# Patient Record
Sex: Female | Born: 1985
Health system: Southern US, Community
[De-identification: ages and names within clinical notes are randomized; demographics above are authoritative.]

## PROBLEM LIST (undated history)

## (undated) ENCOUNTER — Inpatient Hospital Stay (HOSPITAL_COMMUNITY): Payer: Self-pay

## (undated) ENCOUNTER — Inpatient Hospital Stay (HOSPITAL_COMMUNITY): Payer: Medicare Other

## (undated) DIAGNOSIS — Z3490 Encounter for supervision of normal pregnancy, unspecified, unspecified trimester: Secondary | ICD-10-CM

## (undated) DIAGNOSIS — T07XXXA Unspecified multiple injuries, initial encounter: Secondary | ICD-10-CM

## (undated) DIAGNOSIS — F4310 Post-traumatic stress disorder, unspecified: Secondary | ICD-10-CM

## (undated) DIAGNOSIS — G935 Compression of brain: Secondary | ICD-10-CM

## (undated) DIAGNOSIS — J189 Pneumonia, unspecified organism: Secondary | ICD-10-CM

## (undated) DIAGNOSIS — F99 Mental disorder, not otherwise specified: Secondary | ICD-10-CM

## (undated) DIAGNOSIS — K219 Gastro-esophageal reflux disease without esophagitis: Secondary | ICD-10-CM

## (undated) DIAGNOSIS — J329 Chronic sinusitis, unspecified: Secondary | ICD-10-CM

## (undated) DIAGNOSIS — R51 Headache: Secondary | ICD-10-CM

## (undated) DIAGNOSIS — Z87898 Personal history of other specified conditions: Secondary | ICD-10-CM

## (undated) DIAGNOSIS — F329 Major depressive disorder, single episode, unspecified: Secondary | ICD-10-CM

## (undated) DIAGNOSIS — Z9889 Other specified postprocedural states: Secondary | ICD-10-CM

## (undated) DIAGNOSIS — Z87820 Personal history of traumatic brain injury: Secondary | ICD-10-CM

## (undated) DIAGNOSIS — J45909 Unspecified asthma, uncomplicated: Secondary | ICD-10-CM

## (undated) DIAGNOSIS — F32A Depression, unspecified: Secondary | ICD-10-CM

## (undated) DIAGNOSIS — R112 Nausea with vomiting, unspecified: Secondary | ICD-10-CM

## (undated) DIAGNOSIS — Z8781 Personal history of (healed) traumatic fracture: Secondary | ICD-10-CM

## (undated) DIAGNOSIS — J40 Bronchitis, not specified as acute or chronic: Secondary | ICD-10-CM

## (undated) HISTORY — DX: Mental disorder, not otherwise specified: F99

## (undated) HISTORY — DX: Chronic sinusitis, unspecified: J32.9

## (undated) HISTORY — DX: Encounter for supervision of normal pregnancy, unspecified, unspecified trimester: Z34.90

## (undated) HISTORY — DX: Headache: R51

## (undated) HISTORY — DX: Major depressive disorder, single episode, unspecified: F32.9

## (undated) HISTORY — PX: WISDOM TOOTH EXTRACTION: SHX21

## (undated) HISTORY — DX: Depression, unspecified: F32.A

## (undated) HISTORY — PX: DILATION AND CURETTAGE OF UTERUS: SHX78

## (undated) HISTORY — PX: OTHER SURGICAL HISTORY: SHX169

## (undated) HISTORY — DX: Unspecified asthma, uncomplicated: J45.909

## (undated) HISTORY — DX: Bronchitis, not specified as acute or chronic: J40

## (undated) HISTORY — DX: Post-traumatic stress disorder, unspecified: F43.10

---
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
## 2005-12-20 ENCOUNTER — Emergency Department (HOSPITAL_COMMUNITY): Admission: EM | Admit: 2005-12-20 | Discharge: 2005-12-20 | Payer: Self-pay | Admitting: Emergency Medicine

## 2005-12-27 ENCOUNTER — Ambulatory Visit (HOSPITAL_COMMUNITY): Admission: RE | Admit: 2005-12-27 | Discharge: 2005-12-27 | Payer: Self-pay | Admitting: Emergency Medicine

## 2006-09-24 ENCOUNTER — Ambulatory Visit (HOSPITAL_COMMUNITY): Admission: AD | Admit: 2006-09-24 | Discharge: 2006-09-24 | Payer: Self-pay | Admitting: Obstetrics and Gynecology

## 2006-09-25 ENCOUNTER — Inpatient Hospital Stay (HOSPITAL_COMMUNITY): Admission: AD | Admit: 2006-09-25 | Discharge: 2006-09-26 | Payer: Self-pay | Admitting: Obstetrics and Gynecology

## 2007-06-10 ENCOUNTER — Ambulatory Visit: Payer: Self-pay | Admitting: Psychiatry

## 2008-10-27 ENCOUNTER — Other Ambulatory Visit: Admission: RE | Admit: 2008-10-27 | Discharge: 2008-10-27 | Payer: Self-pay | Admitting: Obstetrics and Gynecology

## 2008-11-27 ENCOUNTER — Ambulatory Visit (HOSPITAL_COMMUNITY): Admission: RE | Admit: 2008-11-27 | Discharge: 2008-11-27 | Payer: Self-pay | Admitting: Obstetrics and Gynecology

## 2009-01-04 ENCOUNTER — Ambulatory Visit: Payer: Self-pay | Admitting: Obstetrics and Gynecology

## 2009-01-04 ENCOUNTER — Inpatient Hospital Stay (HOSPITAL_COMMUNITY): Admission: AD | Admit: 2009-01-04 | Discharge: 2009-01-06 | Payer: Self-pay | Admitting: Family Medicine

## 2009-04-05 ENCOUNTER — Other Ambulatory Visit: Admission: RE | Admit: 2009-04-05 | Discharge: 2009-04-05 | Payer: Self-pay | Admitting: Obstetrics and Gynecology

## 2010-04-06 ENCOUNTER — Other Ambulatory Visit: Admission: RE | Admit: 2010-04-06 | Discharge: 2010-04-06 | Payer: Self-pay | Admitting: Obstetrics and Gynecology

## 2010-04-13 ENCOUNTER — Emergency Department (HOSPITAL_COMMUNITY): Admission: EM | Admit: 2010-04-13 | Discharge: 2010-04-13 | Payer: Self-pay | Admitting: Emergency Medicine

## 2011-01-01 ENCOUNTER — Encounter: Payer: Self-pay | Admitting: Obstetrics and Gynecology

## 2011-03-27 LAB — INFLUENZA A+B VIRUS AG-DIRECT(RAPID): Influenza B Ag: NEGATIVE

## 2011-03-27 LAB — URINALYSIS, ROUTINE W REFLEX MICROSCOPIC
Ketones, ur: NEGATIVE mg/dL
Nitrite: NEGATIVE
Urobilinogen, UA: 0.2 mg/dL (ref 0.0–1.0)
pH: 7.5 (ref 5.0–8.0)

## 2011-03-27 LAB — RPR: RPR Ser Ql: NONREACTIVE

## 2011-03-27 LAB — URINE MICROSCOPIC-ADD ON

## 2011-03-27 LAB — CBC
MCHC: 33.5 g/dL (ref 30.0–36.0)
RBC: 3.43 MIL/uL — ABNORMAL LOW (ref 3.87–5.11)
WBC: 11.6 10*3/uL — ABNORMAL HIGH (ref 4.0–10.5)

## 2011-04-06 ENCOUNTER — Emergency Department (HOSPITAL_COMMUNITY)
Admission: EM | Admit: 2011-04-06 | Discharge: 2011-04-07 | Disposition: A | Discharge status: Home / Self Care | Payer: Self-pay | Attending: Emergency Medicine | Admitting: Emergency Medicine

## 2011-04-06 DIAGNOSIS — F319 Bipolar disorder, unspecified: Secondary | ICD-10-CM | POA: Insufficient documentation

## 2011-04-06 DIAGNOSIS — L259 Unspecified contact dermatitis, unspecified cause: Secondary | ICD-10-CM | POA: Insufficient documentation

## 2011-04-06 DIAGNOSIS — J45909 Unspecified asthma, uncomplicated: Secondary | ICD-10-CM | POA: Insufficient documentation

## 2011-04-25 NOTE — Discharge Summary (Signed)
NAMEMelene [name]               ACCOUNT NO.:  [id number]   MEDICAL RECORD NO.:  [id number]          PATIENT TYPE:  INP   LOCATION:  9121                          FACILITY:  [facility]   PHYSICIAN:  Tanya S. Shawnie Pons, M.D.   DATE OF BIRTH:  [birth year]   DATE OF ADMISSION:  01/04/2009  DATE OF DISCHARGE:  01/06/2009                               DISCHARGE SUMMARY   DISCHARGE DIAGNOSES:  1. Spontaneous vaginal delivery at term.  2. Left lower lobe community-acquired pneumonia.  3. Rubella nonimmune.  4. Asthma.   DISCHARGE MEDICATIONS:  1. Prenatal vitamins 1 tablet p.o. daily.  2. Symbicort 80/4.5 two puffs twice a day.  3. Albuterol HFA 1-2 puffs every 4-6 hours as needed for wheezing or      shortness of breath.  4. Ibuprofen 600 mg every 6 hours as needed for pain.  5. Colace 100 mg b.i.d. as needed for constipation.  6. Percocet 5/325 one tablet every 4-6 hours as needed for pain #12      dispensed.  7. Azithromycin 500 mg 1 tablet daily x2 days for a total of 5-day      course.   IMAGING:  Chest x-ray on admission showed streaky left basilar opacity,  could be atelectasis or pneumonia.  No effusions seen.   LABORATORY DATA:  1. CBC on admission showed white blood count 11.6, hemoglobin 10.8,      hematocrit 32.1, platelets 207.  2. RPR nonreactive.  3. Influenza A and influenza B swab negative.   BRIEF HOSPITAL COURSE:  This is a 25 year old, G4, P 2-0-1-2, at 63  weeks' gestation, who presented to the MAU with contractions over the  past several hours.  The patient also was noted to have a productive  cough x1 day and having to use her albuterol at an increased dose of  daily.  Otherwise, she was also noted to have a fever in the MA, but  otherwise had no myalgias, headache, visual changes, nausea, vomiting,  constipation, diarrhea.  1. Spontaneous onset of labor.  The patient was found to be 4 cm and      was contracting every 4-6 minutes with membranes intact, vertex  position.  She was admitted to Labor and Delivery and delivered      several hours later by spontaneous vaginal delivery under epidural      anesthesia.  She had a viable female with Apgars of 8 and 9.  No      lacerations.  The patient continued to have a routine postpartum      course with the exception of increased low back pain and abdominal      cramping.  Her lower back pain had been present prior to pregnancy      and the patient is pursuing physical therapy with her primary care      physician.  2. Left lower lobe pneumonia.  The patient was noted to have shortness      of breath, but maintained good O2 saturations at all times.  She      has been afebrile after one fever  noted on admission with      resolution of shortness of breath.  By day of discharge, she is      doing well and will be discharged home on her home medications of      Symbicort and albuterol with the remainder of the 5-day course of      azithromycin.  3. Asthma.  Please see problem #2.  4. Rubella nonimmune.  The patient will need rubella vaccination      postpartum.  5. Contraception.  The patient is bottle-feeding and desires Depo      prior to discharge.   FOLLOWUP APPOINTMENTS:  The patient is instructed to follow up with her  primary care physician in 2 weeks to further titrate asthma medicines  and follow up resolution of pneumonia.  She is instructed to follow up  at [facility] in 6 weeks for postpartum appointment.   The patient discharged to home in stable condition.      Delbert Harness, MD      Shelbie Proctor. Shawnie Pons, M.D.  Electronically Signed    KB/MEDQ  D:  01/06/2009  T:  01/07/2009  Job:  [id number]   cc:   [facility]

## 2011-04-28 NOTE — Group Therapy Note (Signed)
NAMEMelene [name]               ACCOUNT NO.:  [id number]   MEDICAL RECORD NO.:  [id number]          PATIENT TYPE:  INP   LOCATION:  A411                          FACILITY:  [facility]   PHYSICIAN:  Tilda Burrow, M.D. DATE OF BIRTH:  [birth year]   DATE OF PROCEDURE:  09/25/2006  DATE OF DISCHARGE:                                   PROGRESS NOTE   DELIVERY NOTE   Mary [name] progressed rapidly through labor, especially after she got her  epidural, and was noted to be 10 cm at about 10:00. However, she was very  comfortable with no urge to push, so we let her labor down for while.  At  about 11, she decided to push, and she pushed twice and had a spontaneous  vaginal delivery of a viable female infant at 11:09.  The mouth and nose were  suctioned on the perineum with a bulb syringe.  The shoulders delivered  without any difficulty.  As always interesting to me, the baby had a  spontaneous and lusty cry before delivery of the whole body.  The mom  reached down between her legs after the torso was delivered and pulled the  baby the rest of the way out.  The baby was placed on the maternal abdomen.  Apgars were 9 and 9, weight 7 pounds 7 ounces.  The cord was doubly clamped  and cut by the father of the baby, and the infant transferred to the radiant  warmer.  The placenta separated spontaneously and delivered via controlled  cord traction at 11:12.  It was inspected and appears to be intact with a  three-vessel cord.  At that time, 20 units of Pitocin diluted in 1000 mL of  lactated Ringer's was infused rapidly IV.  Minimal blood loss was noted, and  the fundus was firm.  The vagina was inspected, and no lacerations were  found.  Estimated blood loss 200 mL.  The epidural catheter was then removed  with the blue tip visualized as being intact.      Jacklyn Shell, C.N.M.      Tilda Burrow, M.D.  Electronically Signed    FC/MEDQ  D:  09/25/2006  T:  09/26/2006  Job:   [id number]   cc:   Francoise Schaumann. Raynelle Highland  Fax: [phone]   [facility]

## 2011-04-28 NOTE — H&P (Signed)
NAMEMelene [name]               ACCOUNT NO.:  [id number]   MEDICAL RECORD NO.:  [id number]          PATIENT TYPE:  INP   LOCATION:  LDR2                          FACILITY:  [facility]   PHYSICIAN:  Tilda Burrow, M.D. DATE OF BIRTH:  03/02/1986   DATE OF ADMISSION:  09/25/2006  DATE OF DISCHARGE:  LH                                HISTORY & PHYSICAL   ADMITTING DIAGNOSIS:  Pregnancy at 40 weeks and 2 days.  Prodromal  __labor___ symptoms.  Admitted for amniotomy and induction of labor.   HISTORY OF PRESENT ILLNESS:  This 25 year old gravida 3, para 1, AB1, LMP  December 17, 2005, placing [facility] September 23, 2006, with corresponding second  trimester ultrasound, is admitted after pregnancy course notable for late  initial presentation, uncomplicated prenatal course subsequently, with blood  type A positive, rubella immunity absent, hepatitis, HIV, RPR, GC and  Chlamydia all negative, group B strep negative, glucose tolerance __test wnl   PAST MEDICAL HISTORY:  Positive for asthma, not requiring medication at this  time.   SURGICAL HISTORY:  Negative.   ALLERGIES:  SULFA.   PHYSICAL EXAMINATION:  GENERAL:  A healthy, slim, cheerful, pleasant  Caucasian female who is tolerating labor well, supported by her partner,  Mary Needle.  External monitor shows excellent fetal heart rate reactivity, term  size fetus, vertex presentation.  The cervix is 3 cm long, -2, posterior.  Membranes intact.   PLAN:  Admit at 6 a.m. on September 25, 2006 for amniotomy, induction of labor  with probable epidural for labor management.      Tilda Burrow, M.D.  Electronically Signed     JVF/MEDQ  D:  09/24/2006  T:  09/25/2006  Job:  [id number]

## 2011-04-28 NOTE — Consult Note (Signed)
NAMEMelene [name]               ACCOUNT NO.:  [id number]   MEDICAL RECORD NO.:  [id number]          PATIENT TYPE:  OIB   LOCATION:  LDR5                          FACILITY:  [facility]   PHYSICIAN:  Tilda Burrow, M.D. DATE OF BIRTH:  August 14, 1986   DATE OF CONSULTATION:  09/24/2006  DATE OF DISCHARGE:  09/24/2006                                   CONSULTATION   Observation x2 hour to rule out labor.   HISTORY OF PRESENT ILLNESS:  This 25 year old gravida 3, para 1, AB 1 with  one 54-year-old child at home who after a very easy delivery of an 8 pound 7  ounces under epidural analgesia is seen in labor and delivery at 40 weeks 1  day with prodromal labor symptoms.  Cervix was 2 cm, 70%, -1 on the 9th and  exam today shows the cervix to 2 to 3 cm, rather long in my opinion  uneffaced at -2 and quite posterior.  Vertex presentation is confirmable by  palpation and digital exam.  Membranes are intact.  She shows excellent  reactive fetal heart rate.  Contractions of a very mild irregular nature  every seven minutes or more.  After an hour of observation, there is no  change in cervix, no bloody show, no membrane rupture.   Discussion of options ensued and patient is comfortable with the plan  including her go home, walk.  Being given a prescription for Ambien 10 mg to  take when she goes home with plans to have her come back at 6 a.m. in the  morning for Pitocin induction of labor and probable vaginal delivery  tomorrow.      Tilda Burrow, M.D.  Electronically Signed     JVF/MEDQ  D:  09/24/2006  T:  09/24/2006  Job:  [id number]

## 2011-04-28 NOTE — Op Note (Signed)
NAMEMelene [name]               ACCOUNT NO.:  [id number]   MEDICAL RECORD NO.:  [id number]          PATIENT TYPE:  INP   LOCATION:  A411                          FACILITY:  [facility]   PHYSICIAN:  Lazaro Arms, M.D.   DATE OF BIRTH:  29-Mar-1986   DATE OF PROCEDURE:  09/25/2006  DATE OF DISCHARGE:                                 OPERATIVE REPORT   PROCEDURE:  Epidural placement.   INDICATIONS:  Mary [name] is a 25 year old, gravid 2, para 1 at 40-3/[redacted] weeks  gestation.  She was admitted for induction of labor for post dates.  She is  in the active phase of labor, 6 cm dilated.  She is requesting an epidural.   DESCRIPTION OF PROCEDURE:  She was placed in the sitting position.  Betadine  prep was used.  The L3-4 interspace was identified and 1% lidocaine was  injected into the space as local anesthetic.  There is field drape.  A 17-  gauge Tuohy needle was then used and loss-of-resistance technique employed.  The epidural space was found with one pass without difficulty.  Bupivacaine  0.125% , 10 mL, was given as a test dose without ill effects.  The epidural  catheter was fed into the epidural space and taped down 5 cm in the epidural  space.  Additional 10 mL was given of 0.125% bupivacaine was given to dose  the epidural. Continuous infusion of 0.125% bupivacaine and 2 mcg per mL of  fentanyl was begun at 12 mL an hour.  The patient is getting comfortable.  Blood pressure is stable. Fetal heart rate stable.      Lazaro Arms, M.D.  Electronically Signed     LHE/MEDQ  D:  09/25/2006  T:  09/26/2006  Job:  [id number]

## 2011-05-16 ENCOUNTER — Other Ambulatory Visit (HOSPITAL_COMMUNITY)
Admission: RE | Admit: 2011-05-16 | Discharge: 2011-05-16 | Disposition: A | Discharge status: Home / Self Care | Payer: Self-pay | Source: Ambulatory Visit | Attending: Obstetrics and Gynecology | Admitting: Obstetrics and Gynecology

## 2011-05-16 ENCOUNTER — Other Ambulatory Visit: Payer: Self-pay | Admitting: Adult Health

## 2011-05-16 DIAGNOSIS — Z01419 Encounter for gynecological examination (general) (routine) without abnormal findings: Secondary | ICD-10-CM | POA: Insufficient documentation

## 2012-06-18 ENCOUNTER — Other Ambulatory Visit: Payer: Self-pay | Admitting: Adult Health

## 2012-06-18 ENCOUNTER — Other Ambulatory Visit (HOSPITAL_COMMUNITY)
Admission: RE | Admit: 2012-06-18 | Discharge: 2012-06-18 | Disposition: A | Discharge status: Home / Self Care | Payer: Medicaid Other | Source: Ambulatory Visit | Attending: Obstetrics and Gynecology | Admitting: Obstetrics and Gynecology

## 2012-06-18 DIAGNOSIS — Z01419 Encounter for gynecological examination (general) (routine) without abnormal findings: Secondary | ICD-10-CM | POA: Insufficient documentation

## 2012-06-18 DIAGNOSIS — Z113 Encounter for screening for infections with a predominantly sexual mode of transmission: Secondary | ICD-10-CM | POA: Insufficient documentation

## 2013-06-04 ENCOUNTER — Encounter: Payer: Self-pay | Admitting: *Deleted

## 2013-06-04 DIAGNOSIS — F32A Depression, unspecified: Secondary | ICD-10-CM | POA: Insufficient documentation

## 2013-06-04 DIAGNOSIS — J45909 Unspecified asthma, uncomplicated: Secondary | ICD-10-CM

## 2013-06-04 DIAGNOSIS — F319 Bipolar disorder, unspecified: Secondary | ICD-10-CM

## 2013-06-04 DIAGNOSIS — F329 Major depressive disorder, single episode, unspecified: Secondary | ICD-10-CM

## 2013-06-05 ENCOUNTER — Encounter: Payer: Self-pay | Admitting: Adult Health

## 2013-06-24 ENCOUNTER — Other Ambulatory Visit: Payer: Self-pay | Admitting: Adult Health

## 2013-06-25 ENCOUNTER — Telehealth: Payer: Self-pay | Admitting: Adult Health

## 2013-06-25 NOTE — Telephone Encounter (Signed)
Left message to call back  

## 2013-06-25 NOTE — Telephone Encounter (Signed)
Spoke with pt. Has had Implanon for 1 year. Needs it removed but can't until after she has a family planning pap. Has bipolar and PTSD. Having more depression. Going through a separation. Having backpain also. Having unexplained brusing. Heart flutters x 6-7 months. Numbness in arms and legs, blurred vision, severe migraines, and a heavy chest. Appt scheduled but advised if pt got worse to go to ER. Pt voiced understanding. JSY

## 2013-06-26 ENCOUNTER — Encounter: Payer: Self-pay | Admitting: Obstetrics & Gynecology

## 2013-06-26 NOTE — Telephone Encounter (Signed)
Pt says she has the side effects for the implanon, heavy periods, depression heart flutters arm and leg numb  And she fainted about 3 weeks ago had headaches was seen and Had MRI needs to see neurologist  Per pt.has bruising on leg, Says she is worried, has appt next week for family planning physical and Pap and will schedule appt for implanon removal.She says she is going through a separation and that she has bipolar 1 and PTSD.all of this going on for 6-7 months per pt.Keep appt next week and go to ER if needed.

## 2013-06-26 NOTE — Telephone Encounter (Signed)
Left message

## 2013-06-30 ENCOUNTER — Encounter: Payer: Self-pay | Admitting: Adult Health

## 2013-07-08 ENCOUNTER — Encounter: Payer: Self-pay | Admitting: Advanced Practice Midwife

## 2013-07-08 ENCOUNTER — Ambulatory Visit (INDEPENDENT_AMBULATORY_CARE_PROVIDER_SITE_OTHER): Payer: Medicaid Other | Admitting: Advanced Practice Midwife

## 2013-07-08 ENCOUNTER — Other Ambulatory Visit (HOSPITAL_COMMUNITY)
Admission: RE | Admit: 2013-07-08 | Discharge: 2013-07-08 | Disposition: A | Discharge status: Home / Self Care | Payer: Medicaid Other | Source: Ambulatory Visit | Attending: Advanced Practice Midwife | Admitting: Advanced Practice Midwife

## 2013-07-08 VITALS — BP 100/60 | Ht 64.0 in | Wt 117.0 lb

## 2013-07-08 DIAGNOSIS — Z0000 Encounter for general adult medical examination without abnormal findings: Secondary | ICD-10-CM

## 2013-07-08 DIAGNOSIS — R87610 Atypical squamous cells of undetermined significance on cytologic smear of cervix (ASC-US): Secondary | ICD-10-CM

## 2013-07-08 DIAGNOSIS — Z113 Encounter for screening for infections with a predominantly sexual mode of transmission: Secondary | ICD-10-CM | POA: Insufficient documentation

## 2013-07-08 DIAGNOSIS — Z01419 Encounter for gynecological examination (general) (routine) without abnormal findings: Secondary | ICD-10-CM | POA: Insufficient documentation

## 2013-07-08 DIAGNOSIS — R87810 Cervical high risk human papillomavirus (HPV) DNA test positive: Secondary | ICD-10-CM | POA: Insufficient documentation

## 2013-07-08 DIAGNOSIS — Z1151 Encounter for screening for human papillomavirus (HPV): Secondary | ICD-10-CM | POA: Insufficient documentation

## 2013-07-08 DIAGNOSIS — Z3049 Encounter for surveillance of other contraceptives: Secondary | ICD-10-CM

## 2013-07-08 DIAGNOSIS — IMO0002 Reserved for concepts with insufficient information to code with codable children: Secondary | ICD-10-CM | POA: Insufficient documentation

## 2013-07-08 HISTORY — DX: Atypical squamous cells of undetermined significance on cytologic smear of cervix (ASC-US): R87.610

## 2013-07-08 LAB — HM PAP SMEAR: HM Pap smear: POSITIVE

## 2013-07-08 LAB — CBC
MCV: 96.3 fL (ref 78.0–100.0)
Platelets: 178 10*3/uL (ref 150–400)
RBC: 4.28 MIL/uL (ref 3.87–5.11)
WBC: 8.4 10*3/uL (ref 4.0–10.5)

## 2013-07-08 NOTE — Progress Notes (Signed)
Mary [name] 27 y.o.  Filed Vitals:   07/08/13 1536  BP: 100/60     Past Medical History: Past Medical History  Diagnosis Date  . Asthma   . Headache(784.0)   . Mental disorder   . Bipolar disorder   . Post traumatic stress disorder (PTSD)   . Depression     Past Surgical History: History reviewed. No pertinent past surgical history.  Family History: Family History  Problem Relation Age of Onset  . Cancer Mother 85    breast  . Hypertension Mother   . Depression Mother   . Hypertension Other   . Diabetes Other   . Cancer Other   . Depression Father     Social History: History  Substance Use Topics  . Smoking status: Current Every Day Smoker  . Smokeless tobacco: Never Used     Comment: NEVER USE SNUFF OR CHEWING TOBACCO  . Alcohol Use: No    Allergies:  Allergies  Allergen Reactions  . Peanut-Containing Drug Products Shortness Of Breath  . Codeine   . Sulfa Antibiotics     Mary [name] Wants her nexplanon out (got it a year ago).  She has some numbness in her arm, and has had it since insertion.  Plans to go 1-2 months hormone free to see if some of her sx (dizziness, fatigue, numb arm) go away.  She and her husband separated in June (he has not let her see the kids since then--is petitioning for custody soon).   Current Medications, Allergies, Past Medical History, Past Surgical History, Family History and Social History were reviewed in [employer] record.     Review of Systems  C/O occ dizziness with near fainting X2 this past year, numbness in left arm, bruising.  Patient denies any headaches, blurred vision, shortness of breath, chest pain, abdominal pain, problems with bowel movements, urination.    Physical Exam: General:  Well developed, well nourished, no acute distress Skin:  Warm and dry Neck:  Midline trachea, normal thyroid Lungs; Clear to auscultation bilaterally Breast:  No dominant palpable mass,  retraction, or nipple discharge Cardiovascular: Regular rate and rhythm Abdomen:  Soft, non tender, no hepatosplenomegaly Pelvic:  External genitalia is normal in appearance.  The vagina is normal in appearance.               The cervix is bulbous.  Uterus is felt to be normal size, shape, and contour.  No                adnexal masses or tenderness noted. Rectal: Good sphincter tone, no polyps, or hemorrhoids felt.   Extremities:  No swelling or varicosities noted Psych:  Doing better with adderall/counseling   Impression:  Well woman exam, normal Possible SE of Nexplanon   Plan: f/u ASAP for nexplanon removal  HGB, HIV, RPR, HSV2, HEP B&C

## 2013-07-09 LAB — HEPATITIS B SURFACE ANTIGEN: Hepatitis B Surface Ag: NEGATIVE

## 2013-07-09 LAB — HEPATITIS C ANTIBODY: HCV Ab: NEGATIVE

## 2013-07-09 LAB — RPR

## 2013-07-17 ENCOUNTER — Encounter: Payer: Self-pay | Admitting: Advanced Practice Midwife

## 2013-07-22 ENCOUNTER — Encounter: Payer: Medicaid Other | Admitting: Adult Health

## 2013-07-23 ENCOUNTER — Encounter: Payer: Self-pay | Admitting: Advanced Practice Midwife

## 2013-08-07 ENCOUNTER — Encounter: Payer: Medicaid Other | Admitting: Advanced Practice Midwife

## 2013-08-12 ENCOUNTER — Other Ambulatory Visit: Payer: Self-pay | Admitting: Adult Health

## 2013-08-15 ENCOUNTER — Encounter: Payer: Self-pay | Admitting: Obstetrics & Gynecology

## 2013-08-18 ENCOUNTER — Ambulatory Visit (INDEPENDENT_AMBULATORY_CARE_PROVIDER_SITE_OTHER): Payer: Medicaid Other | Admitting: Advanced Practice Midwife

## 2013-08-18 ENCOUNTER — Encounter: Payer: Self-pay | Admitting: Advanced Practice Midwife

## 2013-08-18 VITALS — BP 100/70 | Wt 122.0 lb

## 2013-08-18 DIAGNOSIS — Z3049 Encounter for surveillance of other contraceptives: Secondary | ICD-10-CM

## 2013-08-18 DIAGNOSIS — Z3046 Encounter for surveillance of implantable subdermal contraceptive: Secondary | ICD-10-CM

## 2013-08-18 NOTE — Telephone Encounter (Signed)
Erroneous encounter

## 2013-08-18 NOTE — Progress Notes (Signed)
Mary [name] wants to go a few months without hormones to see if some of her sx (dizziness, fatigue, numbness in arm) will go away.  She is separated from her husband and does not need birth control.  She was given informed consent for removal of her Implanon, time out was performed.  Signed copy in the chart.  Appropriate time out taken. Implanon site identified.  Area prepped in usual sterile fashon. 2 cc of 1% lidocaine was used to anesthetize the area starting with the distal end of the implant. A small stab incision was made right beside the implant on the distal portion.  The implanon rod was grasped using hemostats and removed without difficulty.  There was less than 3 cc blood loss. There were no complications. Next, the area was cleansed again and the new Nexplanon was inserted without difficulty.  Steri strips and a pressure bandage were applied.  Pt was instructed to remove pressure bandage in a few hours, and keep insertion site covered with a bandaid for 3 days.  Follow-up scheduled PRN problems  CRESENZO-DISHMAN,[name] 08/18/2013 4:18 PM

## 2013-09-05 ENCOUNTER — Encounter: Payer: Medicaid Other | Admitting: Obstetrics & Gynecology

## 2013-12-16 ENCOUNTER — Encounter: Payer: Medicaid Other | Admitting: Obstetrics & Gynecology

## 2014-07-22 ENCOUNTER — Encounter: Payer: Self-pay | Admitting: *Deleted

## 2014-07-22 DIAGNOSIS — N870 Mild cervical dysplasia: Secondary | ICD-10-CM | POA: Insufficient documentation

## 2014-07-22 DIAGNOSIS — R87610 Atypical squamous cells of undetermined significance on cytologic smear of cervix (ASC-US): Secondary | ICD-10-CM

## 2014-07-22 DIAGNOSIS — R87619 Unspecified abnormal cytological findings in specimens from cervix uteri: Secondary | ICD-10-CM | POA: Insufficient documentation

## 2014-07-22 DIAGNOSIS — B977 Papillomavirus as the cause of diseases classified elsewhere: Secondary | ICD-10-CM | POA: Insufficient documentation

## 2014-10-12 ENCOUNTER — Encounter: Payer: Self-pay | Admitting: *Deleted

## 2015-03-29 DIAGNOSIS — F332 Major depressive disorder, recurrent severe without psychotic features: Secondary | ICD-10-CM | POA: Diagnosis not present

## 2015-04-05 DIAGNOSIS — F909 Attention-deficit hyperactivity disorder, unspecified type: Secondary | ICD-10-CM | POA: Diagnosis not present

## 2015-04-19 DIAGNOSIS — F332 Major depressive disorder, recurrent severe without psychotic features: Secondary | ICD-10-CM | POA: Diagnosis not present

## 2015-04-25 DIAGNOSIS — J029 Acute pharyngitis, unspecified: Secondary | ICD-10-CM | POA: Diagnosis not present

## 2015-05-20 DIAGNOSIS — F332 Major depressive disorder, recurrent severe without psychotic features: Secondary | ICD-10-CM | POA: Diagnosis not present

## 2015-05-28 DIAGNOSIS — R509 Fever, unspecified: Secondary | ICD-10-CM | POA: Diagnosis not present

## 2015-05-28 DIAGNOSIS — Z79899 Other long term (current) drug therapy: Secondary | ICD-10-CM | POA: Diagnosis not present

## 2015-05-28 DIAGNOSIS — R51 Headache: Secondary | ICD-10-CM | POA: Diagnosis not present

## 2015-05-28 DIAGNOSIS — F17200 Nicotine dependence, unspecified, uncomplicated: Secondary | ICD-10-CM | POA: Diagnosis not present

## 2015-05-28 DIAGNOSIS — J029 Acute pharyngitis, unspecified: Secondary | ICD-10-CM | POA: Diagnosis not present

## 2015-05-28 DIAGNOSIS — R42 Dizziness and giddiness: Secondary | ICD-10-CM | POA: Diagnosis not present

## 2015-05-28 DIAGNOSIS — J45909 Unspecified asthma, uncomplicated: Secondary | ICD-10-CM | POA: Diagnosis not present

## 2015-06-28 DIAGNOSIS — R87610 Atypical squamous cells of undetermined significance on cytologic smear of cervix (ASC-US): Secondary | ICD-10-CM | POA: Diagnosis not present

## 2015-06-28 DIAGNOSIS — R87810 Cervical high risk human papillomavirus (HPV) DNA test positive: Secondary | ICD-10-CM | POA: Diagnosis not present

## 2015-06-28 DIAGNOSIS — N870 Mild cervical dysplasia: Secondary | ICD-10-CM | POA: Insufficient documentation

## 2015-06-28 DIAGNOSIS — Z3202 Encounter for pregnancy test, result negative: Secondary | ICD-10-CM | POA: Diagnosis not present

## 2015-06-28 DIAGNOSIS — R896 Abnormal cytological findings in specimens from other organs, systems and tissues: Secondary | ICD-10-CM | POA: Diagnosis not present

## 2015-09-07 DIAGNOSIS — J4520 Mild intermittent asthma, uncomplicated: Secondary | ICD-10-CM | POA: Diagnosis not present

## 2015-10-14 DIAGNOSIS — F332 Major depressive disorder, recurrent severe without psychotic features: Secondary | ICD-10-CM | POA: Diagnosis not present

## 2015-10-22 ENCOUNTER — Emergency Department (HOSPITAL_COMMUNITY): Payer: Medicare Other

## 2015-10-22 ENCOUNTER — Emergency Department (HOSPITAL_COMMUNITY)
Admission: EM | Admit: 2015-10-22 | Discharge: 2015-10-22 | Disposition: A | Discharge status: Home / Self Care | Payer: Medicare Other | Attending: Emergency Medicine | Admitting: Emergency Medicine

## 2015-10-22 ENCOUNTER — Encounter (HOSPITAL_COMMUNITY): Payer: Self-pay | Admitting: Emergency Medicine

## 2015-10-22 DIAGNOSIS — J189 Pneumonia, unspecified organism: Secondary | ICD-10-CM | POA: Diagnosis not present

## 2015-10-22 DIAGNOSIS — Z720 Tobacco use: Secondary | ICD-10-CM | POA: Diagnosis not present

## 2015-10-22 DIAGNOSIS — J45901 Unspecified asthma with (acute) exacerbation: Secondary | ICD-10-CM | POA: Insufficient documentation

## 2015-10-22 DIAGNOSIS — R0602 Shortness of breath: Secondary | ICD-10-CM | POA: Diagnosis present

## 2015-10-22 DIAGNOSIS — Z79899 Other long term (current) drug therapy: Secondary | ICD-10-CM | POA: Diagnosis not present

## 2015-10-22 DIAGNOSIS — F319 Bipolar disorder, unspecified: Secondary | ICD-10-CM | POA: Diagnosis not present

## 2015-10-22 DIAGNOSIS — R05 Cough: Secondary | ICD-10-CM | POA: Diagnosis not present

## 2015-10-22 DIAGNOSIS — J159 Unspecified bacterial pneumonia: Secondary | ICD-10-CM | POA: Insufficient documentation

## 2015-10-22 DIAGNOSIS — F17210 Nicotine dependence, cigarettes, uncomplicated: Secondary | ICD-10-CM | POA: Diagnosis not present

## 2015-10-22 MED ORDER — PREDNISONE 50 MG PO TABS
60.0000 mg | ORAL_TABLET | Freq: Once | ORAL | Status: AC
  Administered 2015-10-22: 60 mg via ORAL
  Filled 2015-10-22 (×2): qty 1

## 2015-10-22 MED ORDER — AZITHROMYCIN 250 MG PO TABS: ORAL_TABLET | ORAL | Status: DC

## 2015-10-22 MED ORDER — IPRATROPIUM-ALBUTEROL 0.5-2.5 (3) MG/3ML IN SOLN
3.0000 mL | Freq: Once | RESPIRATORY_TRACT | Status: AC
  Administered 2015-10-22: 3 mL via RESPIRATORY_TRACT
  Filled 2015-10-22: qty 3

## 2015-10-22 MED ORDER — GUAIFENESIN-CODEINE 100-10 MG/5ML PO SOLN: 5.0000 mL | Freq: Four times a day (QID) | ORAL | Status: DC | PRN

## 2015-10-22 MED ORDER — ALBUTEROL SULFATE HFA 108 (90 BASE) MCG/ACT IN AERS: 1.0000 | INHALATION_SPRAY | Freq: Four times a day (QID) | RESPIRATORY_TRACT | Status: DC | PRN

## 2015-10-22 MED ORDER — PREDNISONE 20 MG PO TABS: 40.0000 mg | ORAL_TABLET | Freq: Every day | ORAL | Status: AC

## 2015-10-22 NOTE — Discharge Instructions (Signed)

## 2015-10-22 NOTE — ED Notes (Addendum)
Pt c/o of increasingly worse SOB and cough x 3 days. Pt states she has been coughing up thin, yellow sputum. Pt reports son recently treated for pneumonia. Pt states she has been taking cough syrup with codeine for symptom management.

## 2015-10-22 NOTE — ED Provider Notes (Signed)
CSN: [id number]     Arrival date & time 10/22/15  [id number] History   First MD Initiated Contact with Patient 10/22/15 [phone]     Chief Complaint  Patient presents with  . Shortness of Breath     (Consider location/radiation/quality/duration/timing/severity/associated sxs/prior Treatment) Patient is a 29 y.o. female presenting with URI and wheezing. The history is provided by the patient. No language interpreter was used.  URI Presenting symptoms: congestion and cough   Presenting symptoms: no ear pain, no facial pain, no fatigue, no rhinorrhea and no sore throat  Fever: subjective fevers.   Severity:  Moderate Onset quality:  Gradual Duration:  3 days Timing:  Constant Progression:  Worsening Chronicity:  New Relieved by:  Prescription medications (codeine cough syrup, neb treatment) Worsened by:  Breathing Associated symptoms: headaches, myalgias and wheezing   Associated symptoms: no arthralgias, no neck pain, no sinus pain and no swollen glands   Risk factors: sick contacts   Wheezing Severity:  Mild Severity compared to prior episodes:  Similar Onset quality:  Sudden Duration:  3 days Timing:  Intermittent Progression:  Unchanged Chronicity:  Recurrent (with uri) Context: not animal exposure, not dust, not emotional upset, not exercise, not exposure to allergen, not fumes, not medical treatments, not pet dander, not pollens, not smoke exposure, not strong odors and not tartrazine   Relieved by:  Beta-agonist inhaler and nebulizer treatments Exacerbated by: coughing. Associated symptoms: chest tightness, cough, headaches, shortness of breath (subjective) and sputum production   Associated symptoms: no chest pain, no ear pain, no fatigue, no foot swelling, no orthopnea, no PND, no rash, no rhinorrhea, no sore throat, no stridor and no swollen glands  Fever: subjective fevers.     Past Medical History  Diagnosis Date  . Asthma   . Headache(784.0)   . Mental disorder   .  Bipolar disorder (HCC)   . Post traumatic stress disorder (PTSD)   . Depression    History reviewed. No pertinent past surgical history. Family History  Problem Relation Age of Onset  . Cancer Mother [id number]    breast  . Hypertension Mother   . Depression Mother   . Hypertension Other   . Diabetes Other   . Cancer Other   . Depression Father   . Seizures Father   . Cancer Maternal Grandmother     breast and cervical   Social History  Substance Use Topics  . Smoking status: Current Every Day Smoker -- 1.00 packs/day    Types: Cigarettes  . Smokeless tobacco: Never Used     Comment: NEVER USE SNUFF OR CHEWING TOBACCO  . Alcohol Use: No   OB History    Gravida Para Term Preterm AB TAB SAB Ectopic Multiple Living   4 3 3  1  1   3      Review of Systems  Constitutional: Positive for chills. Negative for fatigue. Fever: subjective fevers.  HENT: Positive for congestion. Negative for ear pain, rhinorrhea and sore throat.   Respiratory: Positive for cough, sputum production, chest tightness, shortness of breath (subjective) and wheezing. Negative for stridor.   Cardiovascular: Negative for chest pain, orthopnea and PND.  Musculoskeletal: Positive for myalgias. Negative for arthralgias and neck pain.  Skin: Negative for rash.  Neurological: Positive for headaches.  All other systems reviewed and are negative.     Allergies  Peanut-containing drug products; Codeine; and Sulfa antibiotics  Home Medications   Prior to Admission medications   Medication Sig Start Date End Date  Taking? Authorizing Provider  amphetamine-dextroamphetamine (ADDERALL) 30 MG tablet Take 30 mg by mouth daily.    Historical Provider, MD  clonazePAM (KLONOPIN) 0.5 MG tablet Take 0.5 mg by mouth 2 (two) times daily as needed for anxiety.    Historical Provider, MD   BP 130/82 mmHg  Pulse 110  Temp(Src) 99.6 F (37.6 C)  Resp 18  SpO2 96%  LMP 10/01/2015 Physical Exam  Constitutional: She is  oriented to person, place, and time. She appears well-developed and well-nourished. No distress.  HENT:  Head: Normocephalic and atraumatic.  Eyes: Conjunctivae are normal. No scleral icterus.  Neck: Normal range of motion.  Cardiovascular: Normal rate, regular rhythm and normal heart sounds.  Exam reveals no gallop and no friction rub.   No murmur heard. Pulmonary/Chest: Effort normal. No respiratory distress. She has wheezes.  Mild expiratory wheeze\productive cough  Abdominal: Soft. Bowel sounds are normal. She exhibits no distension and no mass. There is no tenderness. There is no guarding.  Neurological: She is alert and oriented to person, place, and time.  Skin: Skin is warm and dry. She is not diaphoretic.  Nursing note and vitals reviewed.   ED Course  Procedures (including critical care time) Labs Review Labs Reviewed - No data to display  Imaging Review No results found. I have personally reviewed and evaluated these images and lab results as part of my medical decision-making.   EKG Interpretation None      MDM   Final diagnoses:  CAP (community acquired pneumonia)    8:56 AM BP 130/82 mmHg  Pulse 110  Temp(Src) 99.6 F (37.6 C)  Resp 18  SpO2 96%  LMP 10/01/2015 Patient with uri Hx RAD. Wheezing, cough Son with CAP last week dx Patient will get cxr, duoneb, oral steroids. Reeval. No hx hospitalization or intubation   She is a current daily smoker. I spent 5 minutes in counseling of smoking cessation Smoking cessation instruction/counseling given:  counseled patient on the dangers of tobacco use, advised patient to stop smoking, and reviewed strategies to maximize success     9:25 AM BP 130/82 mmHg  Pulse 110  Temp(Src) 99.6 F (37.6 C)  Resp 18  SpO2 97%  LMP 10/01/2015 CXR results consistent with CAP. Will dc with Abx, Steroid/ codein cough syrup, albuterol. Discussed reasons to seek immediate medical care in the ED. Patient appears  safe for discharge.   Arthor Captain, PA-C 10/22/15 1610  Lorre Nick, MD 10/23/15 [phone]

## 2015-11-06 ENCOUNTER — Emergency Department (HOSPITAL_COMMUNITY): Payer: Medicare Other

## 2015-11-06 ENCOUNTER — Encounter (HOSPITAL_COMMUNITY): Payer: Self-pay

## 2015-11-06 ENCOUNTER — Emergency Department (HOSPITAL_COMMUNITY)
Admission: EM | Admit: 2015-11-06 | Discharge: 2015-11-06 | Disposition: A | Discharge status: Home / Self Care | Payer: Medicare Other | Attending: Emergency Medicine | Admitting: Emergency Medicine

## 2015-11-06 DIAGNOSIS — E876 Hypokalemia: Secondary | ICD-10-CM | POA: Diagnosis not present

## 2015-11-06 DIAGNOSIS — Z8659 Personal history of other mental and behavioral disorders: Secondary | ICD-10-CM | POA: Diagnosis not present

## 2015-11-06 DIAGNOSIS — F17210 Nicotine dependence, cigarettes, uncomplicated: Secondary | ICD-10-CM | POA: Insufficient documentation

## 2015-11-06 DIAGNOSIS — J159 Unspecified bacterial pneumonia: Secondary | ICD-10-CM | POA: Diagnosis not present

## 2015-11-06 DIAGNOSIS — R05 Cough: Secondary | ICD-10-CM | POA: Diagnosis not present

## 2015-11-06 DIAGNOSIS — J189 Pneumonia, unspecified organism: Secondary | ICD-10-CM | POA: Diagnosis not present

## 2015-11-06 DIAGNOSIS — J45901 Unspecified asthma with (acute) exacerbation: Secondary | ICD-10-CM | POA: Diagnosis not present

## 2015-11-06 DIAGNOSIS — R0602 Shortness of breath: Secondary | ICD-10-CM | POA: Diagnosis not present

## 2015-11-06 DIAGNOSIS — Z79899 Other long term (current) drug therapy: Secondary | ICD-10-CM | POA: Insufficient documentation

## 2015-11-06 LAB — CBC WITH DIFFERENTIAL/PLATELET
BAND NEUTROPHILS: 0 %
BASOS ABS: 0 10*3/uL (ref 0.0–0.1)
BASOS PCT: 0 %
Blasts: 0 %
EOS ABS: 0.3 10*3/uL (ref 0.0–0.7)
Eosinophils Relative: 3 %
HCT: 44.2 % (ref 36.0–46.0)
HEMOGLOBIN: 14.9 g/dL (ref 12.0–15.0)
Lymphocytes Relative: 23 %
Lymphs Abs: 2 10*3/uL (ref 0.7–4.0)
MCH: 32.6 pg (ref 26.0–34.0)
MCHC: 33.7 g/dL (ref 30.0–36.0)
MCV: 96.7 fL (ref 78.0–100.0)
METAMYELOCYTES PCT: 0 %
MONO ABS: 0.3 10*3/uL (ref 0.1–1.0)
MYELOCYTES: 0 %
Monocytes Relative: 4 %
NEUTROS PCT: 70 %
Neutro Abs: 6 10*3/uL (ref 1.7–7.7)
Other: 0 %
PLATELETS: 226 10*3/uL (ref 150–400)
PROMYELOCYTES ABS: 0 %
RBC: 4.57 MIL/uL (ref 3.87–5.11)
RDW: 13.5 % (ref 11.5–15.5)
WBC: 8.6 10*3/uL (ref 4.0–10.5)
nRBC: 0 /100 WBC

## 2015-11-06 LAB — BASIC METABOLIC PANEL
Anion gap: 11 (ref 5–15)
BUN: 11 mg/dL (ref 6–20)
CHLORIDE: 101 mmol/L (ref 101–111)
CO2: 23 mmol/L (ref 22–32)
Calcium: 9.1 mg/dL (ref 8.9–10.3)
Creatinine, Ser: 0.79 mg/dL (ref 0.44–1.00)
Glucose, Bld: 86 mg/dL (ref 65–99)
POTASSIUM: 3.2 mmol/L — AB (ref 3.5–5.1)
SODIUM: 135 mmol/L (ref 135–145)

## 2015-11-06 MED ORDER — ONDANSETRON HCL 4 MG/2ML IJ SOLN
4.0000 mg | Freq: Once | INTRAMUSCULAR | Status: AC
  Administered 2015-11-06: 4 mg via INTRAVENOUS
  Filled 2015-11-06: qty 2

## 2015-11-06 MED ORDER — SODIUM CHLORIDE 0.9 % IV BOLUS (SEPSIS)
1000.0000 mL | Freq: Once | INTRAVENOUS | Status: AC
  Administered 2015-11-06: 1000 mL via INTRAVENOUS

## 2015-11-06 MED ORDER — ONDANSETRON 8 MG PO TBDP
8.0000 mg | ORAL_TABLET | Freq: Three times a day (TID) | ORAL | Status: DC | PRN
Start: 2015-11-06 — End: 2016-01-21

## 2015-11-06 NOTE — Discharge Instructions (Signed)

## 2015-11-06 NOTE — ED Notes (Signed)
Pt alert & oriented x4, stable gait. Patient given discharge instructions, paperwork & prescription(s). Patient  instructed to stop at the registration desk to finish any additional paperwork. Patient verbalized understanding. Pt left department w/ no further questions. 

## 2015-11-06 NOTE — ED Provider Notes (Signed)
CSN: [id number]     Arrival date & time 11/06/15  1810 History   First MD Initiated Contact with Patient 11/06/15 1850     Chief Complaint  Patient presents with  . Shortness of Breath    HPI Pt was diagnosed with PNA a few weeks ago.  She was given a prescription for abx.  A couple of days ago she started to feel more short of breath again.  She also had some sharp pain in her chest.  She has noticed that her eyes are twitching and she has to blink.  She continues to cough and she now has had trouble with vomiting and nausea when she tries to eat.  No abdominal pain.  She does feel more stressed than usual. Past Medical History  Diagnosis Date  . Asthma   . Headache(784.0)   . Mental disorder   . Bipolar disorder (HCC)   . Post traumatic stress disorder (PTSD)   . Depression    History reviewed. No pertinent past surgical history. Family History  Problem Relation Age of Onset  . Cancer Mother 847    breast  . Hypertension Mother   . Depression Mother   . Hypertension Other   . Diabetes Other   . Cancer Other   . Depression Father   . Seizures Father   . Cancer Maternal Grandmother     breast and cervical   Social History  Substance Use Topics  . Smoking status: Current Every Day Smoker -- 1.00 packs/day    Types: Cigarettes  . Smokeless tobacco: Never Used     Comment: NEVER USE SNUFF OR CHEWING TOBACCO  . Alcohol Use: No   OB History    Gravida Para Term Preterm AB TAB SAB Ectopic Multiple Living   4 3 3  1  1   3      Review of Systems  All other systems reviewed and are negative.     Allergies  Codeine; Peanut-containing drug products; and Sulfa antibiotics  Home Medications   Prior to Admission medications   Medication Sig Start Date End Date Taking? Authorizing Provider  albuterol (PROVENTIL HFA;VENTOLIN HFA) 108 (90 BASE) MCG/ACT inhaler Inhale 1-2 puffs into the lungs every 6 (six) hours as needed for wheezing or shortness of breath. 10/22/15  Yes  Arthor CaptainAbigail Harris, PA-C  albuterol (PROVENTIL) (2.5 MG/3ML) 0.083% nebulizer solution Take 3 mLs by nebulization every 6 (six) hours as needed for wheezing or shortness of breath.  09/07/15  Yes Historical Provider, MD  guaiFENesin-codeine 100-10 MG/5ML syrup Take 5-10 mLs by mouth every 6 (six) hours as needed for cough. 10/22/15  Yes Arthor CaptainAbigail Harris, PA-C  ibuprofen (ADVIL,MOTRIN) 200 MG tablet Take 800 mg by mouth every 6 (six) hours as needed for moderate pain.   Yes Historical Provider, MD  montelukast (SINGULAIR) 10 MG tablet Take 10 mg by mouth daily as needed (allergies).   Yes Historical Provider, MD  azithromycin (ZITHROMAX Z-PAK) 250 MG tablet 2 po day one, then 1 daily x 4 days Patient not taking: Reported on 11/06/2015 10/22/15   Arthor CaptainAbigail Harris, PA-C  ondansetron (ZOFRAN ODT) 8 MG disintegrating tablet Take 1 tablet (8 mg total) by mouth every 8 (eight) hours as needed for nausea or vomiting. 11/06/15   Linwood DibblesJon Antonios Ostrow, MD   BP 125/86 mmHg  Pulse 107  Temp(Src) 97.6 F (36.4 C) (Oral)  Resp 18  Ht 5\' 4"  (1.626 m)  Wt 67.586 kg  BMI 25.56 kg/m2  SpO2 99%  LMP  10/01/2015 Physical Exam  Constitutional: She appears well-developed and well-nourished. No distress.  HENT:  Head: Normocephalic and atraumatic.  Right Ear: External ear normal.  Left Ear: External ear normal.  Eyes: Conjunctivae are normal. Right eye exhibits no discharge. Left eye exhibits no discharge. No scleral icterus.  Neck: Neck supple. No tracheal deviation present.  Cardiovascular: Normal rate, regular rhythm and intact distal pulses.   Pulmonary/Chest: Effort normal and breath sounds normal. No stridor. No respiratory distress. She has no wheezes. She has no rales.  Abdominal: Soft. Bowel sounds are normal. She exhibits no distension. There is no tenderness. There is no rebound and no guarding.  Musculoskeletal: She exhibits no edema or tenderness.  Neurological: She is alert. She has normal strength. No cranial nerve  deficit (no facial droop, extraocular movements intact, no slurred speech) or sensory deficit. She exhibits normal muscle tone. She displays no seizure activity. Coordination normal.  Skin: Skin is warm and dry. No rash noted.  Psychiatric: She has a normal mood and affect.  Nursing note and vitals reviewed.   ED Course  Procedures (including critical care time) Labs Review Labs Reviewed  BASIC METABOLIC PANEL - Abnormal; Notable for the following:    Potassium 3.2 (*)    All other components within normal limits  CBC WITH DIFFERENTIAL/PLATELET    Imaging Review Dg Chest 2 View  11/06/2015  CLINICAL DATA:  Shortness of breath and cough. Here 2 weeks ago for pneumonia. Today shortness of breath with chest pain on inspiration. EXAM: CHEST  2 VIEW COMPARISON:  10/22/2015 FINDINGS: Normal heart size and pulmonary vascularity. Residual but decreased infiltration in the left lung base. Right lung is clear. No pneumothorax. No pleural effusions. Mediastinal contours appear intact. IMPRESSION: Improving infiltration in the left lung base since prior study. Electronically Signed   By: Burman Nieves M.D.   On: 11/06/2015 20:04   I have personally reviewed and evaluated these images and lab results as part of my medical decision-making.    MDM   Final diagnoses:  CAP (community acquired pneumonia)  Hypokalemia    Patient's laboratory tests are reassuring. She has a slight decrease in her potassium.  I will have her eat potassium rich food for the next few days. She's not had any vomiting here in the emergency room. X-ray shows that her pneumonia is improving.  At this time there does not appear to be any evidence of an acute emergency medical condition and the patient appears stable for discharge with appropriate outpatient follow up.   Linwood Dibbles, MD 11/06/15 [phone]

## 2015-11-06 NOTE — ED Notes (Signed)
Patient states she was seen here two weeks ago with PNA. States today she has had SOB with inspiratory chest pain. Used albuterol inhaler and nebulizer without relief.

## 2015-12-12 NOTE — L&D Delivery Note (Signed)
Patient is 30 y.o. [id number] admitted for SOL   Delivery Note At 1:05 AM a viable female was delivered via Vaginal, Spontaneous Delivery (Presentation: LOA).  APGAR: 8, 9; weight pending.   Placenta status: intact.  Cord: 3 vessel. Without complications  Anesthesia:  none Episiotomy:  none Lacerations:  1st degree periurethral and 1st degree perineal Suture Repair: 4.0 monocril and 3.0 vicral Est. Blood Loss (mL):  100  Mom to postpartum.  Baby to Couplet care / Skin to Skin.  Leland HerElsia J Yoo 08/13/2016, 2:11 AM    Upon arrival patient was complete and pushing. She pushed with good maternal effort to deliver a healthy baby female. Baby delivered without difficulty, was noted to have good tone and place on maternal abdomen for oral suctioning, drying and stimulation. Delayed cord clamping performed. Placenta delivered intact with 3V cord. Vaginal canal and perineum was inspected and repaired; hemostatic. Pitocin was started and uterus massaged until bleeding slowed. Counts of sharps, instruments, and lap pads were all correct.   Leland HerElsia J Yoo, DO PGY-1 9/3/20172:11 AM    OB FELLOW DELIVERY ATTESTATION  I was gloved and present for the delivery in its entirety, and I agree with the above resident's note.    Jen MowElizabeth Wauneta Silveria, DO OB Fellow 5:04 AM

## 2016-01-21 ENCOUNTER — Encounter: Payer: Self-pay | Admitting: Adult Health

## 2016-01-21 ENCOUNTER — Ambulatory Visit (INDEPENDENT_AMBULATORY_CARE_PROVIDER_SITE_OTHER): Payer: Medicare Other | Admitting: Adult Health

## 2016-01-21 VITALS — BP 122/80 | HR 90 | Ht 64.0 in | Wt 159.0 lb

## 2016-01-21 DIAGNOSIS — Z3201 Encounter for pregnancy test, result positive: Secondary | ICD-10-CM | POA: Diagnosis not present

## 2016-01-21 DIAGNOSIS — O3680X0 Pregnancy with inconclusive fetal viability, not applicable or unspecified: Secondary | ICD-10-CM

## 2016-01-21 DIAGNOSIS — N925 Other specified irregular menstruation: Secondary | ICD-10-CM

## 2016-01-21 DIAGNOSIS — Z3490 Encounter for supervision of normal pregnancy, unspecified, unspecified trimester: Secondary | ICD-10-CM

## 2016-01-21 HISTORY — DX: Encounter for supervision of normal pregnancy, unspecified, unspecified trimester: Z34.90

## 2016-01-21 LAB — POCT URINE PREGNANCY: Preg Test, Ur: POSITIVE — AB

## 2016-01-21 MED ORDER — FLINTSTONES COMPLETE 60 MG PO CHEW: CHEWABLE_TABLET | ORAL | Status: DC

## 2016-01-21 NOTE — Patient Instructions (Signed)
First Trimester of Pregnancy The first trimester of pregnancy is from week 1 until the end of week 12 (months 1 through 3). A week after a sperm fertilizes an egg, the egg will implant on the wall of the uterus. This embryo will begin to develop into a baby. Genes from you and your partner are forming the baby. The female genes determine whether the baby is a boy or a girl. At 6-8 weeks, the eyes and face are formed, and the heartbeat can be seen on ultrasound. At the end of 12 weeks, all the baby's organs are formed.  Now that you are pregnant, you will want to do everything you can to have a healthy baby. Two of the most important things are to get good prenatal care and to follow your health care provider's instructions. Prenatal care is all the medical care you receive before the baby's birth. This care will help prevent, find, and treat any problems during the pregnancy and childbirth. BODY CHANGES Your body goes through many changes during pregnancy. The changes vary from woman to woman.   You may gain or lose a couple of pounds at first.  You may feel sick to your stomach (nauseous) and throw up (vomit). If the vomiting is uncontrollable, call your health care provider.  You may tire easily.  You may develop headaches that can be relieved by medicines approved by your health care provider.  You may urinate more often. Painful urination may mean you have a bladder infection.  You may develop heartburn as a result of your pregnancy.  You may develop constipation because certain hormones are causing the muscles that push waste through your intestines to slow down.  You may develop hemorrhoids or swollen, bulging veins (varicose veins).  Your breasts may begin to grow larger and become tender. Your nipples may stick out more, and the tissue that surrounds them (areola) may become darker.  Your gums may bleed and may be sensitive to brushing and flossing.  Dark spots or blotches (chloasma,  mask of pregnancy) may develop on your face. This will likely fade after the baby is born.  Your menstrual periods will stop.  You may have a loss of appetite.  You may develop cravings for certain kinds of food.  You may have changes in your emotions from day to day, such as being excited to be pregnant or being concerned that something may go wrong with the pregnancy and baby.  You may have more vivid and strange dreams.  You may have changes in your hair. These can include thickening of your hair, rapid growth, and changes in texture. Some women also have hair loss during or after pregnancy, or hair that feels dry or thin. Your hair will most likely return to normal after your baby is born. WHAT TO EXPECT AT YOUR PRENATAL VISITS During a routine prenatal visit:  You will be weighed to make sure you and the baby are growing normally.  Your blood pressure will be taken.  Your abdomen will be measured to track your baby's growth.  The fetal heartbeat will be listened to starting around week 10 or 12 of your pregnancy.  Test results from any previous visits will be discussed. Your health care provider may ask you:  How you are feeling.  If you are feeling the baby move.  If you have had any abnormal symptoms, such as leaking fluid, bleeding, severe headaches, or abdominal cramping.  If you are using any tobacco products,   including cigarettes, chewing tobacco, and electronic cigarettes.  If you have any questions. Other tests that may be performed during your first trimester include:  Blood tests to find your blood type and to check for the presence of any previous infections. They will also be used to check for low iron levels (anemia) and Rh antibodies. Later in the pregnancy, blood tests for diabetes will be done along with other tests if problems develop.  Urine tests to check for infections, diabetes, or protein in the urine.  An ultrasound to confirm the proper growth  and development of the baby.  An amniocentesis to check for possible genetic problems.  Fetal screens for spina bifida and Down syndrome.  You may need other tests to make sure you and the baby are doing well.  HIV (human immunodeficiency virus) testing. Routine prenatal testing includes screening for HIV, unless you choose not to have this test. HOME CARE INSTRUCTIONS  Medicines  Follow your health care provider's instructions regarding medicine use. Specific medicines may be either safe or unsafe to take during pregnancy.  Take your prenatal vitamins as directed.  If you develop constipation, try taking a stool softener if your health care provider approves. Diet  Eat regular, well-balanced meals. Choose a variety of foods, such as meat or vegetable-based protein, fish, milk and low-fat dairy products, vegetables, fruits, and whole grain breads and cereals. Your health care provider will help you determine the amount of weight gain that is right for you.  Avoid raw meat and uncooked cheese. These carry germs that can cause birth defects in the baby.  Eating four or five small meals rather than three large meals a day may help relieve nausea and vomiting. If you start to feel nauseous, eating a few soda crackers can be helpful. Drinking liquids between meals instead of during meals also seems to help nausea and vomiting.  If you develop constipation, eat more high-fiber foods, such as fresh vegetables or fruit and whole grains. Drink enough fluids to keep your urine clear or pale yellow. Activity and Exercise  Exercise only as directed by your health care provider. Exercising will help you:  Control your weight.  Stay in shape.  Be prepared for labor and delivery.  Experiencing pain or cramping in the lower abdomen or low back is a good sign that you should stop exercising. Check with your health care provider before continuing normal exercises.  Try to avoid standing for long  periods of time. Move your legs often if you must stand in one place for a long time.  Avoid heavy lifting.  Wear low-heeled shoes, and practice good posture.  You may continue to have sex unless your health care provider directs you otherwise. Relief of Pain or Discomfort  Wear a good support bra for breast tenderness.   Take warm sitz baths to soothe any pain or discomfort caused by hemorrhoids. Use hemorrhoid cream if your health care provider approves.   Rest with your legs elevated if you have leg cramps or low back pain.  If you develop varicose veins in your legs, wear support hose. Elevate your feet for 15 minutes, 3-4 times a day. Limit salt in your diet. Prenatal Care  Schedule your prenatal visits by the twelfth week of pregnancy. They are usually scheduled monthly at first, then more often in the last 2 months before delivery.  Write down your questions. Take them to your prenatal visits.  Keep all your prenatal visits as directed by your   health care provider. Safety  Wear your seat belt at all times when driving.  Make a list of emergency phone numbers, including numbers for family, friends, the hospital, and police and fire departments. General Tips  Ask your health care provider for a referral to a local prenatal education class. Begin classes no later than at the beginning of month 6 of your pregnancy.  Ask for help if you have counseling or nutritional needs during pregnancy. Your health care provider can offer advice or refer you to specialists for help with various needs.  Do not use hot tubs, steam rooms, or saunas.  Do not douche or use tampons or scented sanitary pads.  Do not cross your legs for long periods of time.  Avoid cat litter boxes and soil used by cats. These carry germs that can cause birth defects in the baby and possibly loss of the fetus by miscarriage or stillbirth.  Avoid all smoking, herbs, alcohol, and medicines not prescribed by  your health care provider. Chemicals in these affect the formation and growth of the baby.  Do not use any tobacco products, including cigarettes, chewing tobacco, and electronic cigarettes. If you need help quitting, ask your health care provider. You may receive counseling support and other resources to help you quit.  Schedule a dentist appointment. At home, brush your teeth with a soft toothbrush and be gentle when you floss. SEEK MEDICAL CARE IF:   You have dizziness.  You have mild pelvic cramps, pelvic pressure, or nagging pain in the abdominal area.  You have persistent nausea, vomiting, or diarrhea.  You have a bad smelling vaginal discharge.  You have pain with urination.  You notice increased swelling in your face, hands, legs, or ankles. SEEK IMMEDIATE MEDICAL CARE IF:   You have a fever.  You are leaking fluid from your vagina.  You have spotting or bleeding from your vagina.  You have severe abdominal cramping or pain.  You have rapid weight gain or loss.  You vomit blood or material that looks like coffee grounds.  You are exposed to [place] measles and have never had them.  You are exposed to fifth disease or chickenpox.  You develop a severe headache.  You have shortness of breath.  You have any kind of trauma, such as from a fall or a car accident.   This information is not intended to replace advice given to you by your health care provider. Make sure you discuss any questions you have with your health care provider.   Document Released: 11/21/2001 Document Revised: 12/18/2014 Document Reviewed: 10/07/2013 Elsevier Interactive Patient Education 2016 [employer]. Take flintstones Return in 1 week for [place]

## 2016-01-21 NOTE — Progress Notes (Signed)
Subjective:     Patient ID: Mary [name], female   DOB: 1986-03-17, 30 y.o.   MRN: [id number]  HPI Mary [name] is a 30 year old white female in for UPT, she has missed a period and had +HPT and stomach hurts, at top of jeans at times, no bleeding.   Review of Systems Patient denies any headaches, hearing loss, fatigue, blurred vision, shortness of breath, chest pain,  problems with bowel movements, urination, or intercourse. No joint pain or mood swings.See HPI for positives. Reviewed past medical,surgical, social and family history. Reviewed medications and allergies.     Objective:   Physical Exam BP 122/80 mmHg  Pulse 90  Ht  (1.626 m)  Wt 159 lb (72.122 kg)  BMI 27.28 kg/m2  LMP 12/08/2015 UPT+ about 6+2 weeks by LMP with EDD 09/13/16.Skin warm and dry. Neck: mid line trachea, normal thyroid, good ROM, no lymphadenopathy noted. Lungs: clear to ausculation bilaterally. Cardiovascular: regular rate and rhythm.Abdomen soft and non tender, no HSM, wear different pants.She says she wants tubes tied this time.    Assessment:     Pregnant     Plan:     Take 2 flintsones daily Return in 1 week for dating [place] Review handout on first trimester

## 2016-01-26 ENCOUNTER — Other Ambulatory Visit: Payer: Medicare Other

## 2016-01-26 ENCOUNTER — Other Ambulatory Visit: Payer: Self-pay | Admitting: Adult Health

## 2016-01-26 ENCOUNTER — Ambulatory Visit (INDEPENDENT_AMBULATORY_CARE_PROVIDER_SITE_OTHER): Payer: Medicare Other

## 2016-01-26 DIAGNOSIS — Z3682 Encounter for antenatal screening for nuchal translucency: Secondary | ICD-10-CM

## 2016-01-26 DIAGNOSIS — Z36 Encounter for antenatal screening of mother: Secondary | ICD-10-CM | POA: Diagnosis not present

## 2016-01-26 DIAGNOSIS — Z3A12 12 weeks gestation of pregnancy: Secondary | ICD-10-CM

## 2016-01-26 DIAGNOSIS — O3680X0 Pregnancy with inconclusive fetal viability, not applicable or unspecified: Secondary | ICD-10-CM

## 2016-01-26 DIAGNOSIS — Z369 Encounter for antenatal screening, unspecified: Secondary | ICD-10-CM

## 2016-01-26 NOTE — Progress Notes (Signed)
[place] 11+2 wks,single IUP,pos fht 167 bpm,NB present,NT .74mm,crl 54.54mm,post pl gr 0,normal ov's bilat

## 2016-01-31 ENCOUNTER — Ambulatory Visit (INDEPENDENT_AMBULATORY_CARE_PROVIDER_SITE_OTHER): Payer: Medicare Other | Admitting: Adult Health

## 2016-01-31 ENCOUNTER — Telehealth: Payer: Self-pay | Admitting: Obstetrics & Gynecology

## 2016-01-31 ENCOUNTER — Encounter: Payer: Self-pay | Admitting: Women's Health

## 2016-01-31 VITALS — BP 106/72 | HR 88 | Wt 160.0 lb

## 2016-01-31 DIAGNOSIS — J40 Bronchitis, not specified as acute or chronic: Secondary | ICD-10-CM | POA: Diagnosis not present

## 2016-01-31 DIAGNOSIS — J329 Chronic sinusitis, unspecified: Secondary | ICD-10-CM

## 2016-01-31 DIAGNOSIS — J321 Chronic frontal sinusitis: Secondary | ICD-10-CM

## 2016-01-31 DIAGNOSIS — Z3490 Encounter for supervision of normal pregnancy, unspecified, unspecified trimester: Secondary | ICD-10-CM

## 2016-01-31 DIAGNOSIS — Z331 Pregnant state, incidental: Secondary | ICD-10-CM | POA: Diagnosis not present

## 2016-01-31 HISTORY — DX: Bronchitis, not specified as acute or chronic: J40

## 2016-01-31 HISTORY — DX: Chronic sinusitis, unspecified: J32.9

## 2016-01-31 MED ORDER — MONTELUKAST SODIUM 10 MG PO TABS: 10.0000 mg | ORAL_TABLET | Freq: Every day | ORAL | Status: DC

## 2016-01-31 MED ORDER — AZITHROMYCIN 250 MG PO TABS: ORAL_TABLET | ORAL | Status: DC

## 2016-01-31 MED ORDER — CETIRIZINE HCL 10 MG PO CHEW: 10.0000 mg | CHEWABLE_TABLET | Freq: Every day | ORAL | Status: DC

## 2016-01-31 NOTE — Telephone Encounter (Signed)
Pt called stating that she would like to speak with a nurse, Pt did not state the reason why. Please contact pt °

## 2016-01-31 NOTE — Patient Instructions (Signed)
Acute Bronchitis Bronchitis is inflammation of the airways that extend from the windpipe into the lungs (bronchi). The inflammation often causes mucus to develop. This leads to a cough, which is the most common symptom of bronchitis.  In acute bronchitis, the condition usually develops suddenly and goes away over time, usually in a couple weeks. Smoking, allergies, and asthma can make bronchitis worse. Repeated episodes of bronchitis may cause further lung problems.  CAUSES Acute bronchitis is most often caused by the same virus that causes a cold. The virus can spread from person to person (contagious) through coughing, sneezing, and touching contaminated objects. SIGNS AND SYMPTOMS   Cough.   Fever.   Coughing up mucus.   Body aches.   Chest congestion.   Chills.   Shortness of breath.   Sore throat.  DIAGNOSIS  Acute bronchitis is usually diagnosed through a physical exam. Your health care provider will also ask you questions about your medical history. Tests, such as chest X-rays, are sometimes done to rule out other conditions.  TREATMENT  Acute bronchitis usually goes away in a couple weeks. Oftentimes, no medical treatment is necessary. Medicines are sometimes given for relief of fever or cough. Antibiotic medicines are usually not needed but may be prescribed in certain situations. In some cases, an inhaler may be recommended to help reduce shortness of breath and control the cough. A cool mist vaporizer may also be used to help thin bronchial secretions and make it easier to clear the chest.  HOME CARE INSTRUCTIONS  Get plenty of rest.   Drink enough fluids to keep your urine clear or pale yellow (unless you have a medical condition that requires fluid restriction). Increasing fluids may help thin your respiratory secretions (sputum) and reduce chest congestion, and it will prevent dehydration.   Take medicines only as directed by your health care provider.  If  you were prescribed an antibiotic medicine, finish it all even if you start to feel better.  Avoid smoking and secondhand smoke. Exposure to cigarette smoke or irritating chemicals will make bronchitis worse. If you are a smoker, consider using nicotine gum or skin patches to help control withdrawal symptoms. Quitting smoking will help your lungs heal faster.   Reduce the chances of another bout of acute bronchitis by washing your hands frequently, avoiding people with cold symptoms, and trying not to touch your hands to your mouth, nose, or eyes.   Keep all follow-up visits as directed by your health care provider.  SEEK MEDICAL CARE IF: Your symptoms do not improve after 1 week of treatment.  SEEK IMMEDIATE MEDICAL CARE IF:  You develop an increased fever or chills.   You have chest pain.   You have severe shortness of breath.  You have bloody sputum.   You develop dehydration.  You faint or repeatedly feel like you are going to pass out.  You develop repeated vomiting.  You develop a severe headache. MAKE SURE YOU:   Understand these instructions.  Will watch your condition.  Will get help right away if you are not doing well or get worse.   This information is not intended to replace advice given to you by your health care provider. Make sure you discuss any questions you have with your health care provider.   Document Released: 01/04/2005 Document Revised: 12/18/2014 Document Reviewed: 05/20/2013 Elsevier Interactive Patient Education 2016 Elsevier Inc. Take Z pack, zyrtec and singular and increase fluids

## 2016-01-31 NOTE — Telephone Encounter (Signed)
Pt states she is having some trouble breathing and some tightness in chest, states it feels like when she had pneumonia.  Pt states she is about [redacted] wks pregnant, pt was transferred to front staff for an appointment to be made for evaluation.

## 2016-01-31 NOTE — Progress Notes (Signed)
Subjective:     Patient ID: Aivy Akter, female   DOB: 25-Dec-1985, 30 y.o.   MRN: [id number]  HPI Catie is a 30 year old white female, who is pregnant but has complaints of headache, dizziness at times and cough with sneezing and feels Short of breath when lying on left, has taken sons Singulair and tylenol, she says she had bronchitis and pneumonia last year.   Review of Systems  Patient denies any hearing loss, fatigue, blurred vision, chest pain, abdominal pain, problems with bowel movements, urination, or intercourse. No joint pain or mood swings.See HPI for positives. Reviewed past medical,surgical, social and family history. Reviewed medications and allergies.     Objective:   Physical Exam BP 106/72 mmHg  Pulse 88  Wt 160 lb (72.576 kg)  LMP 11/08/2015 (Exact Date) Skin warm and dry. Neck: mid line trachea, normal thyroid, good ROM, no lymphadenopathy noted. Lung has some wheezes on left, right sounds clear. Cardiovascular: regular rate and rhythm.Throat pink no swelling or pustules and has + tenderness over frontal sinus and some at maxillary.Face time 15 minutes.    Assessment:     Frontal sinus infection Bronchitis    Pregnant  Plan:     Rx Z pack  Rx zyrtec 10 mg 1 daily Rx singulair 10 mg 1 daily #30 with 1 refill Increase fluids,use any cough drop and hot tea Keep appt Wednesday with Selena Batten for new ob

## 2016-02-02 ENCOUNTER — Encounter: Payer: Medicare Other | Admitting: Women's Health

## 2016-02-08 ENCOUNTER — Ambulatory Visit (INDEPENDENT_AMBULATORY_CARE_PROVIDER_SITE_OTHER): Payer: Medicare Other | Admitting: Adult Health

## 2016-02-08 ENCOUNTER — Other Ambulatory Visit (HOSPITAL_COMMUNITY)
Admission: RE | Admit: 2016-02-08 | Discharge: 2016-02-08 | Disposition: A | Discharge status: Home / Self Care | Payer: Medicare Other | Source: Ambulatory Visit | Attending: Adult Health | Admitting: Adult Health

## 2016-02-08 ENCOUNTER — Encounter: Payer: Self-pay | Admitting: Adult Health

## 2016-02-08 VITALS — BP 128/70 | HR 86 | Wt 161.0 lb

## 2016-02-08 DIAGNOSIS — Z01419 Encounter for gynecological examination (general) (routine) without abnormal findings: Secondary | ICD-10-CM | POA: Insufficient documentation

## 2016-02-08 DIAGNOSIS — Z113 Encounter for screening for infections with a predominantly sexual mode of transmission: Secondary | ICD-10-CM | POA: Diagnosis not present

## 2016-02-08 DIAGNOSIS — Z36 Encounter for antenatal screening of mother: Secondary | ICD-10-CM | POA: Diagnosis not present

## 2016-02-08 DIAGNOSIS — Z1151 Encounter for screening for human papillomavirus (HPV): Secondary | ICD-10-CM | POA: Insufficient documentation

## 2016-02-08 DIAGNOSIS — Z331 Pregnant state, incidental: Secondary | ICD-10-CM | POA: Diagnosis not present

## 2016-02-08 DIAGNOSIS — Z3481 Encounter for supervision of other normal pregnancy, first trimester: Secondary | ICD-10-CM | POA: Diagnosis not present

## 2016-02-08 DIAGNOSIS — R319 Hematuria, unspecified: Secondary | ICD-10-CM

## 2016-02-08 DIAGNOSIS — Z3490 Encounter for supervision of normal pregnancy, unspecified, unspecified trimester: Secondary | ICD-10-CM

## 2016-02-08 DIAGNOSIS — Z369 Encounter for antenatal screening, unspecified: Secondary | ICD-10-CM

## 2016-02-08 DIAGNOSIS — Z0283 Encounter for blood-alcohol and blood-drug test: Secondary | ICD-10-CM

## 2016-02-08 DIAGNOSIS — Z20828 Contact with and (suspected) exposure to other viral communicable diseases: Secondary | ICD-10-CM

## 2016-02-08 DIAGNOSIS — Z1389 Encounter for screening for other disorder: Secondary | ICD-10-CM

## 2016-02-08 DIAGNOSIS — Z0189 Encounter for other specified special examinations: Secondary | ICD-10-CM | POA: Diagnosis not present

## 2016-02-08 HISTORY — DX: Encounter for supervision of normal pregnancy, unspecified, unspecified trimester: Z34.90

## 2016-02-08 LAB — POCT URINALYSIS DIPSTICK
Glucose, UA: NEGATIVE
Ketones, UA: NEGATIVE
LEUKOCYTES UA: NEGATIVE
Nitrite, UA: NEGATIVE
PROTEIN UA: NEGATIVE

## 2016-02-08 NOTE — Patient Instructions (Signed)

## 2016-02-08 NOTE — Progress Notes (Signed)
Subjective:  Mary [name] is a 30 y.o. [phone] 3Caucasian female at [redacted]w[redacted]d by LMP and [place] being seen today for her first obstetrical visit.  Her obstetrical history is significant for has had bronchitis and has history of asthma.  Pregnancy history fully reviewed.  Patient reports has some cramping. Denies vb, uti s/s, abnormal/malodorous vag d/c, or vulvovaginal itching/irritation.  BP 128/70 mmHg  Pulse 86  Wt 161 lb (73.029 kg)  LMP 11/08/2015 (Exact Date)  HISTORY: OB History  Gravida Para Term Preterm AB SAB TAB Ectopic Multiple Living  # Outcome Date GA Lbr Len/2nd Weight Sex Delivery Anes PTL Lv  5 Current           4 Term 01/04/09 [redacted]w[redacted]d  8 lb (3.629 kg) Genella Mech   Y  3 Term 09/25/06 [redacted]w[redacted]d  7 lb 8 oz (3.402 kg) M Vag-Spont   Y  2 SAB 2006          1 Term 10/16/02 [redacted]w[redacted]d  8 lb 6 oz (3.799 kg) M Vag-Spont EPI  Y     Past Medical History  Diagnosis Date  . Asthma   . Headache(784.0)   . Pregnant 01/21/2016  . Sinus infection 01/31/2016  . Bronchitis 01/31/2016  . Mental disorder   . Bipolar depression (HCC)   . Post traumatic stress disorder (PTSD)   . Depression   . Supervision of normal pregnancy, antepartum 02/08/2016     [facility] at             13+1 week FOB   Kemani Heidel 30 yo WM Dating By  LMP and [place] Pap  02/08/16 GC/CT Initial:                36+wks: Genetic Screen NT/IT:  CF screen  Anatomic [place]  Flu vaccine  Tdap Recommended ~ 28wks Glucose Screen  2 hr GBS  Feed Preference  Contraception  Circumcision  Childbirth Classes  Pediatrician     History reviewed. No pertinent past surgical history. Family History  Problem Relation Age of Onset  . Cancer Mother 54    breast  . Hypertension Mother   . Depression Mother   . Hypertension Other   . Diabetes Other   . Cancer Other   . Depression Father   . Seizures Father   . Cancer Maternal Grandmother     breast and cervical  . Asthma Son   . Asthma Son     Exam    System:     General: Well developed & nourished, no acute distress   Skin: Warm & dry, normal coloration and turgor, no rashes   Neurologic: Alert & oriented, normal mood   Cardiovascular: Regular rate & rhythm   Respiratory: Effort & rate normal, LCTAB, acyanotic   Abdomen: Soft, non tender   Extremities: normal strength, tone   Pelvic Exam:    Perineum: Normal perineum   Vulva: Normal, no lesions   Vagina:  Normal mucosa, normal discharge   Cervix: Normal, bulbous, appears closed   Uterus: Normal size/shape/contour for GA   Thin prep pap smear with GC/CHL performed  FHR: 155 via doppler   Assessment:   Pregnancy: [id number] Patient Active Problem List   Diagnosis Date Noted  . Supervision of normal pregnancy, antepartum 02/08/2016  . Sinus infection 01/31/2016  . Bronchitis 01/31/2016  . Pregnant 01/21/2016  . Abnormal Pap smear of cervix 07/22/2014  .  HPV in female 07/22/2014  . Mild dysplasia of cervix 07/22/2014  . Bipolar disorder (HCC) 06/04/2013  . Asthma 06/04/2013  . Depression 06/04/2013    [redacted]w[redacted]d [id number] New OB visit     Plan:  Initial labs drawn Continue prenatal vitamins Problem list reviewed and updated Reviewed n/v relief measures and warning s/s to report Reviewed recommended weight gain based on pre-gravid BMI Encouraged well-balanced diet Genetic Screening discussed Integrated Screen: requested Cystic fibrosis screening discussed requested Ultrasound discussed; fetal survey: requested Follow up in 3 weeks for second IT draw and see midwife  Adline Potter, NP 02/08/2016 12:04 PM

## 2016-02-09 LAB — URINE CULTURE: ORGANISM ID, BACTERIA: NO GROWTH

## 2016-02-10 LAB — CYTOLOGY - PAP

## 2016-02-15 LAB — URINALYSIS, ROUTINE W REFLEX MICROSCOPIC
Bilirubin, UA: NEGATIVE
GLUCOSE, UA: NEGATIVE
KETONES UA: NEGATIVE
LEUKOCYTES UA: NEGATIVE
NITRITE UA: NEGATIVE
PROTEIN UA: NEGATIVE
RBC, UA: NEGATIVE
SPEC GRAV UA: 1.02 (ref 1.005–1.030)
Urobilinogen, Ur: 0.2 mg/dL (ref 0.2–1.0)
pH, UA: 7 (ref 5.0–7.5)

## 2016-02-15 LAB — PMP SCREEN PROFILE (10S), URINE
AMPHETAMINE SCRN UR: NEGATIVE ng/mL
BENZODIAZEPINE SCREEN, URINE: NEGATIVE ng/mL
Barbiturate Screen, Ur: NEGATIVE ng/mL
CREATININE(CRT), U: 103.6 mg/dL (ref 20.0–300.0)
Cannabinoids Ur Ql Scn: NEGATIVE ng/mL
Cocaine(Metab.)Screen, Urine: NEGATIVE ng/mL
Methadone Scn, Ur: NEGATIVE ng/mL
OPIATE SCRN UR: NEGATIVE ng/mL
OXYCODONE+OXYMORPHONE UR QL SCN: NEGATIVE ng/mL
PCP SCRN UR: NEGATIVE ng/mL
PH UR, DRUG SCRN: 7.2 (ref 4.5–8.9)
Propoxyphene, Screen: NEGATIVE ng/mL

## 2016-02-15 LAB — VARICELLA ZOSTER ANTIBODY, IGG: Varicella zoster IgG: <135 index — ABNORMAL LOW (ref >165)

## 2016-02-15 LAB — CBC
HEMOGLOBIN: 12.6 g/dL (ref 11.1–15.9)
Hematocrit: 37.3 % (ref 34.0–46.6)
MCH: 31.3 pg (ref 26.6–33.0)
MCHC: 33.8 g/dL (ref 31.5–35.7)
MCV: 93 fL (ref 79–97)
PLATELETS: 256 10*3/uL (ref 150–379)
RBC: 4.02 x10E6/uL (ref 3.77–5.28)
RDW: 13.2 % (ref 12.3–15.4)
WBC: 8.3 10*3/uL (ref 3.4–10.8)

## 2016-02-15 LAB — CYSTIC FIBROSIS MUTATION 97: GENE DIS ANAL CARRIER INTERP BLD/T-IMP: NOT DETECTED

## 2016-02-15 LAB — RUBELLA SCREEN

## 2016-02-15 LAB — ANTIBODY SCREEN: ANTIBODY SCREEN: NEGATIVE

## 2016-02-15 LAB — ABO/RH: Rh Factor: POSITIVE

## 2016-02-15 LAB — HEPATITIS B SURFACE ANTIGEN: HEP B S AG: NEGATIVE

## 2016-02-15 LAB — HIV ANTIBODY (ROUTINE TESTING W REFLEX): HIV SCREEN 4TH GENERATION: NONREACTIVE

## 2016-02-15 LAB — RPR: RPR: NONREACTIVE

## 2016-02-24 ENCOUNTER — Telehealth: Payer: Self-pay | Admitting: *Deleted

## 2016-02-24 NOTE — Telephone Encounter (Signed)
Pt reassured.

## 2016-02-24 NOTE — Telephone Encounter (Signed)
Pt c/o nose bleeding this am, dizzy spells, nausea. Pt states she is scared, Mother has history of pre-eclampsia. Call transferred to Cathie BeamsFran Cresenzo-Dishmon, CNM.

## 2016-02-29 ENCOUNTER — Encounter: Payer: Self-pay | Admitting: Advanced Practice Midwife

## 2016-02-29 ENCOUNTER — Ambulatory Visit (INDEPENDENT_AMBULATORY_CARE_PROVIDER_SITE_OTHER): Payer: Medicare Other | Admitting: Advanced Practice Midwife

## 2016-02-29 VITALS — BP 100/60 | HR 99 | Wt 166.0 lb

## 2016-02-29 DIAGNOSIS — Z363 Encounter for antenatal screening for malformations: Secondary | ICD-10-CM

## 2016-02-29 DIAGNOSIS — Z331 Pregnant state, incidental: Secondary | ICD-10-CM

## 2016-02-29 DIAGNOSIS — Z36 Encounter for antenatal screening of mother: Secondary | ICD-10-CM | POA: Diagnosis not present

## 2016-02-29 DIAGNOSIS — Z3482 Encounter for supervision of other normal pregnancy, second trimester: Secondary | ICD-10-CM

## 2016-02-29 DIAGNOSIS — Z369 Encounter for antenatal screening, unspecified: Secondary | ICD-10-CM

## 2016-02-29 DIAGNOSIS — Z1389 Encounter for screening for other disorder: Secondary | ICD-10-CM

## 2016-02-29 LAB — POCT URINALYSIS DIPSTICK
Blood, UA: NEGATIVE
Glucose, UA: NEGATIVE
KETONES UA: NEGATIVE
LEUKOCYTES UA: NEGATIVE
Nitrite, UA: NEGATIVE
PROTEIN UA: NEGATIVE

## 2016-02-29 MED ORDER — ONDANSETRON HCL 4 MG PO TABS: 4.0000 mg | ORAL_TABLET | Freq: Three times a day (TID) | ORAL | Status: DC | PRN

## 2016-02-29 NOTE — Progress Notes (Signed)
[id number] 618w1d Estimated Date of Delivery: 08/14/16  Blood pressure 100/60, pulse 99, weight 166 lb (75.297 kg), last menstrual period 11/08/2015.   BP weight and urine results all reviewed and noted.  Please refer to the obstetrical flow sheet for the fundal height and fetal heart rate documentation:  Patient  denies any bleeding and no rupture of membranes symptoms or regular contractions. Patient still has nausea all the time.              All questions were answered.  Orders Placed This Encounter  Procedures  . US OB Comp + 14 Wk  . Maternal Screen, Integrated #2  . POCT urinalysis dipstick    Plan:  Continued routine obstetrical care, rx zofran  Return in about 3 weeks (around 03/21/2016) for LROB, AV:[id number]:Anatomy.

## 2016-02-29 NOTE — Progress Notes (Signed)
Pt states that she still has nausea all the time.

## 2016-02-29 NOTE — Patient Instructions (Signed)

## 2016-03-21 ENCOUNTER — Ambulatory Visit (INDEPENDENT_AMBULATORY_CARE_PROVIDER_SITE_OTHER): Payer: Medicare Other | Admitting: Women's Health

## 2016-03-21 ENCOUNTER — Encounter: Payer: Self-pay | Admitting: Women's Health

## 2016-03-21 ENCOUNTER — Ambulatory Visit (INDEPENDENT_AMBULATORY_CARE_PROVIDER_SITE_OTHER): Payer: Medicare Other

## 2016-03-21 VITALS — BP 120/60 | HR 80 | Wt 167.2 lb

## 2016-03-21 DIAGNOSIS — Z331 Pregnant state, incidental: Secondary | ICD-10-CM

## 2016-03-21 DIAGNOSIS — Z363 Encounter for antenatal screening for malformations: Secondary | ICD-10-CM

## 2016-03-21 DIAGNOSIS — Z3482 Encounter for supervision of other normal pregnancy, second trimester: Secondary | ICD-10-CM

## 2016-03-21 DIAGNOSIS — Z3A20 20 weeks gestation of pregnancy: Secondary | ICD-10-CM

## 2016-03-21 DIAGNOSIS — Z3492 Encounter for supervision of normal pregnancy, unspecified, second trimester: Secondary | ICD-10-CM

## 2016-03-21 DIAGNOSIS — Z1389 Encounter for screening for other disorder: Secondary | ICD-10-CM

## 2016-03-21 DIAGNOSIS — Z36 Encounter for antenatal screening of mother: Secondary | ICD-10-CM

## 2016-03-21 LAB — POCT URINALYSIS DIPSTICK
Glucose, UA: NEGATIVE
Ketones, UA: NEGATIVE
Leukocytes, UA: NEGATIVE
NITRITE UA: NEGATIVE
PROTEIN UA: NEGATIVE
RBC UA: NEGATIVE

## 2016-03-21 NOTE — Patient Instructions (Signed)

## 2016-03-21 NOTE — Progress Notes (Signed)
Low-risk OB appointment [id number] 6955w1d Estimated Date of Delivery: 08/14/16 BP 120/60 mmHg  Pulse 80  Wt 167 lb 3.2 oz (75.841 kg)  LMP 11/08/2015 (Exact Date)  BP, weight, and urine reviewed.  Refer to obstetrical flow sheet for FH & FHR.  Reports good fm.  Denies regular uc's, lof, vb, or uti s/s. No complaints. Reviewed today's normal anatomy u/s, warning s/s to report, fm. Plan:  Continue routine obstetrical care  F/U in 4wks for OB appointment  Allergic to chicken feathers- unable to get flu shot.

## 2016-03-21 NOTE — Progress Notes (Signed)
US 19+1wks,cephalic,post pl gr 0,cx 4.6 cm,fhr 146bpm,svp of fluid 7.2cm,normal ov's bilat,measurements c/w dates,anatomy complete,no obvious abnormalities seen

## 2016-03-27 LAB — MATERNAL SCREEN, INTEGRATED #2
ADSF: 0.86
AFP MARKER: 21.2 ng/mL
AFP MoM: 0.64
CROWN RUMP LENGTH: 54.7 mm
DIA MoM: 2.11
DIA Value: 342.4 pg/mL
ESTRIOL UNCONJUGATED: 0.84 ng/mL
GEST. AGE ON COLLECTION DATE: 12.1 wk
GESTATIONAL AGE: 17 wk
HCG MOM: 1.82
HCG VALUE: 50 [IU]/mL
MATERNAL AGE AT EDD: 29.8 a
NUMBER OF FETUSES: 1
Nuchal Translucency (NT): 0.9 mm
Nuchal Translucency MoM: 0.66
PAPP-A MoM: 1.06
PAPP-A Value: 857.5 ng/mL
TEST RESULTS: NEGATIVE
WEIGHT: 159 [lb_av]
WEIGHT: 166 [lb_av]

## 2016-03-27 LAB — MATERNAL SCREEN, INTEGRATED #1
CROWN RUMP LENGTH MAT SCREEN: 54.7 mm
GEST. AGE ON COLLECTION DATE: 12.1 wk
MATERNAL AGE AT EDD: 29.8 a
NUMBER OF FETUSES: 1
Nuchal Translucency (NT): 0.9 mm
PAPP-A Value: 857.5 ng/mL
WEIGHT: 159 [lb_av]

## 2016-04-04 DIAGNOSIS — F319 Bipolar disorder, unspecified: Secondary | ICD-10-CM | POA: Diagnosis not present

## 2016-04-18 ENCOUNTER — Encounter: Payer: Self-pay | Admitting: Women's Health

## 2016-04-18 ENCOUNTER — Ambulatory Visit (INDEPENDENT_AMBULATORY_CARE_PROVIDER_SITE_OTHER): Payer: Medicare Other | Admitting: Women's Health

## 2016-04-18 VITALS — BP 106/60 | HR 88 | Wt 172.0 lb

## 2016-04-18 DIAGNOSIS — N898 Other specified noninflammatory disorders of vagina: Secondary | ICD-10-CM

## 2016-04-18 DIAGNOSIS — Z331 Pregnant state, incidental: Secondary | ICD-10-CM | POA: Diagnosis not present

## 2016-04-18 DIAGNOSIS — N760 Acute vaginitis: Secondary | ICD-10-CM | POA: Diagnosis not present

## 2016-04-18 DIAGNOSIS — Z1389 Encounter for screening for other disorder: Secondary | ICD-10-CM

## 2016-04-18 DIAGNOSIS — IMO0002 Reserved for concepts with insufficient information to code with codable children: Secondary | ICD-10-CM

## 2016-04-18 DIAGNOSIS — Z3492 Encounter for supervision of normal pregnancy, unspecified, second trimester: Secondary | ICD-10-CM

## 2016-04-18 DIAGNOSIS — O26892 Other specified pregnancy related conditions, second trimester: Secondary | ICD-10-CM

## 2016-04-18 LAB — POCT URINALYSIS DIPSTICK
GLUCOSE UA: NEGATIVE
KETONES UA: NEGATIVE
Leukocytes, UA: NEGATIVE
Nitrite, UA: NEGATIVE
Protein, UA: NEGATIVE
RBC UA: NEGATIVE

## 2016-04-18 LAB — POCT WET PREP (WET MOUNT): CLUE CELLS WET PREP WHIFF POC: POSITIVE

## 2016-04-18 MED ORDER — METRONIDAZOLE 500 MG PO TABS: 500.0000 mg | ORAL_TABLET | Freq: Two times a day (BID) | ORAL | Status: DC

## 2016-04-18 NOTE — Progress Notes (Signed)
Low-risk OB appointment [id number] 1123w1d Estimated Date of Delivery: 08/14/16 BP 106/60 mmHg  Pulse 88  Wt 172 lb (78.019 kg)  LMP 11/08/2015 (Exact Date)  BP, weight, and urine reviewed.  Refer to obstetrical flow sheet for FH & FHR.  Reports good fm.  Denies regular uc's, lof, vb, or uti s/s. Pressure/cramping. Rt hand goes numb- to try wrist splint.  Spec exam: cx long/closed, small amt thin yellow malodorous d/c, wet prep: many wbc's and clues, Rx metronidazole 500mg  BID x 7d for BV, no sex while taking. Will also send urine for gc/ct d/t many wbc's Reviewed ptl s/s, fm. Plan:  Continue routine obstetrical care  F/U in 4wks for OB appointment and pn2

## 2016-04-18 NOTE — Patient Instructions (Addendum)
You will have your sugar test next visit.  Please do not eat or drink anything after midnight the night before you come, not even water.  You will be here for at least two hours.     Call the office ([phone]) or go to [facility] if:  You begin to have strong, frequent contractions  Your water breaks.  Sometimes it is a big gush of fluid, sometimes it is just a trickle that keeps getting your panties wet or running down your legs  You have vaginal bleeding.  It is normal to have a small amount of spotting if your cervix was checked.   You don't feel your baby moving like normal.  If you don't, get you something to eat and drink and lay down and focus on feeling your baby move.   If your baby is still not moving like normal, you should call the office or go to [facility].  Second Trimester of Pregnancy The second trimester is from week 13 through week 28, months 4 through 6. The second trimester is often a time when you feel your best. Your body has also adjusted to being pregnant, and you begin to feel better physically. Usually, morning sickness has lessened or quit completely, you may have more energy, and you may have an increase in appetite. The second trimester is also a time when the fetus is growing rapidly. At the end of the sixth month, the fetus is about 9 inches long and weighs about 1 pounds. You will likely begin to feel the baby move (quickening) between 18 and 20 weeks of the pregnancy. BODY CHANGES Your body goes through many changes during pregnancy. The changes vary from woman to woman.   Your weight will continue to increase. You will notice your lower abdomen bulging out.  You may begin to get stretch marks on your hips, abdomen, and breasts.  You may develop headaches that can be relieved by medicines approved by your health care provider.  You may urinate more often because the fetus is pressing on your bladder.  You may develop or continue to have  heartburn as a result of your pregnancy.  You may develop constipation because certain hormones are causing the muscles that push waste through your intestines to slow down.  You may develop hemorrhoids or swollen, bulging veins (varicose veins).  You may have back pain because of the weight gain and pregnancy hormones relaxing your joints between the bones in your pelvis and as a result of a shift in weight and the muscles that support your balance.  Your breasts will continue to grow and be tender.  Your gums may bleed and may be sensitive to brushing and flossing.  Dark spots or blotches (chloasma, mask of pregnancy) may develop on your face. This will likely fade after the baby is born.  A dark line from your belly button to the pubic area (linea nigra) may appear. This will likely fade after the baby is born.  You may have changes in your hair. These can include thickening of your hair, rapid growth, and changes in texture. Some women also have hair loss during or after pregnancy, or hair that feels dry or thin. Your hair will most likely return to normal after your baby is born. WHAT TO EXPECT AT YOUR PRENATAL VISITS During a routine prenatal visit:  You will be weighed to make sure you and the fetus are growing normally.  Your blood pressure will be taken.    Your abdomen will be measured to track your baby's growth.  The fetal heartbeat will be listened to.  Any test results from the previous visit will be discussed. Your health care provider may ask you:  How you are feeling.  If you are feeling the baby move.  If you have had any abnormal symptoms, such as leaking fluid, bleeding, severe headaches, or abdominal cramping.  If you have any questions. Other tests that may be performed during your second trimester include:  Blood tests that check for:  Low iron levels (anemia).  Gestational diabetes (between 24 and 28 weeks).  Rh antibodies.  Urine tests to check  for infections, diabetes, or protein in the urine.  An ultrasound to confirm the proper growth and development of the baby.  An amniocentesis to check for possible genetic problems.  Fetal screens for spina bifida and Down syndrome. HOME CARE INSTRUCTIONS   Avoid all smoking, herbs, alcohol, and unprescribed drugs. These chemicals affect the formation and growth of the baby.  Follow your health care provider's instructions regarding medicine use. There are medicines that are either safe or unsafe to take during pregnancy.  Exercise only as directed by your health care provider. Experiencing uterine cramps is a good sign to stop exercising.  Continue to eat regular, healthy meals.  Wear a good support bra for breast tenderness.  Do not use hot tubs, steam rooms, or saunas.  Wear your seat belt at all times when driving.  Avoid raw meat, uncooked cheese, cat litter boxes, and soil used by cats. These carry germs that can cause birth defects in the baby.  Take your prenatal vitamins.  Try taking a stool softener (if your health care provider approves) if you develop constipation. Eat more high-fiber foods, such as fresh vegetables or fruit and whole grains. Drink plenty of fluids to keep your urine clear or pale yellow.  Take warm sitz baths to soothe any pain or discomfort caused by hemorrhoids. Use hemorrhoid cream if your health care provider approves.  If you develop varicose veins, wear support hose. Elevate your feet for 15 minutes, 3-4 times a day. Limit salt in your diet.  Avoid heavy lifting, wear low heel shoes, and practice good posture.  Rest with your legs elevated if you have leg cramps or low back pain.  Visit your dentist if you have not gone yet during your pregnancy. Use a soft toothbrush to brush your teeth and be gentle when you floss.  A sexual relationship may be continued unless your health care provider directs you otherwise.  Continue to go to all your  prenatal visits as directed by your health care provider. SEEK MEDICAL CARE IF:   You have dizziness.  You have mild pelvic cramps, pelvic pressure, or nagging pain in the abdominal area.  You have persistent nausea, vomiting, or diarrhea.  You have a bad smelling vaginal discharge.  You have pain with urination. SEEK IMMEDIATE MEDICAL CARE IF:   You have a fever.  You are leaking fluid from your vagina.  You have spotting or bleeding from your vagina.  You have severe abdominal cramping or pain.  You have rapid weight gain or loss.  You have shortness of breath with chest pain.  You notice sudden or extreme swelling of your face, hands, ankles, feet, or legs.  You have not felt your baby move in over an hour.  You have severe headaches that do not go away with medicine.  You have vision changes.   Document Released: 11/21/2001 Document Revised: 12/02/2013 Document Reviewed: 01/28/2013 [facility] Patient Information 2015 [place]Care, [place]. This information is not intended to replace advice given to you by your health care provider. Make sure you discuss any questions you have with your health care provider.  Bacterial Vaginosis Bacterial vaginosis is a vaginal infection that occurs when the normal balance of bacteria in the vagina is disrupted. It results from an overgrowth of certain bacteria. This is the most common vaginal infection in women of childbearing age. Treatment is important to prevent complications, especially in pregnant women, as it can cause a premature delivery. CAUSES  Bacterial vaginosis is caused by an increase in harmful bacteria that are normally present in smaller amounts in the vagina. Several different kinds of bacteria can cause bacterial vaginosis. However, the reason that the condition develops is not fully understood. RISK FACTORS Certain activities or behaviors can put you at an increased risk of developing bacterial vaginosis, including:  Having a  new sex partner or multiple sex partners.  Douching.  Using an intrauterine device (IUD) for contraception. Women do not get bacterial vaginosis from toilet seats, bedding, swimming pools, or contact with objects around them. SIGNS AND SYMPTOMS  Some women with bacterial vaginosis have no signs or symptoms. Common symptoms include:  Grey vaginal discharge.  A fishlike odor with discharge, especially after sexual intercourse.  Itching or burning of the vagina and vulva.  Burning or pain with urination. DIAGNOSIS  Your health care provider will take a medical history and examine the vagina for signs of bacterial vaginosis. A sample of vaginal fluid may be taken. Your health care provider will look at this sample under a microscope to check for bacteria and abnormal cells. A vaginal pH test may also be done.  TREATMENT  Bacterial vaginosis may be treated with antibiotic medicines. These may be given in the form of a pill or a vaginal cream. A second round of antibiotics may be prescribed if the condition comes back after treatment. Because bacterial vaginosis increases your risk for sexually transmitted diseases, getting treated can help reduce your risk for chlamydia, gonorrhea, HIV, and herpes. HOME CARE INSTRUCTIONS   Only take over-the-counter or prescription medicines as directed by your health care provider.  If antibiotic medicine was prescribed, take it as directed. Make sure you finish it even if you start to feel better.  Tell all sexual partners that you have a vaginal infection. They should see their health care provider and be treated if they have problems, such as a mild rash or itching.  During treatment, it is important that you follow these instructions:  Avoid sexual activity or use condoms correctly.  Do not douche.  Avoid alcohol as directed by your health care provider.  Avoid breastfeeding as directed by your health care provider. SEEK MEDICAL CARE IF:   Your  symptoms are not improving after 3 days of treatment.  You have increased discharge or pain.  You have a fever. MAKE SURE YOU:   Understand these instructions.  Will watch your condition.  Will get help right away if you are not doing well or get worse. FOR MORE INFORMATION  Centers for Disease Control and Prevention, Division of STD Prevention: SolutionApps.co.zawww.cdc.gov/std American Sexual Health Association (ASHA): www.ashastd.org    This information is not intended to replace advice given to you by your health care provider. Make sure you discuss any questions you have with your health care provider.   Document Released: 11/27/2005 Document Revised: 12/18/2014 Document  Reviewed: 07/09/2013 Elsevier Interactive Patient Education [employer]

## 2016-04-19 LAB — GC/CHLAMYDIA PROBE AMP
Chlamydia trachomatis, NAA: NEGATIVE
NEISSERIA GONORRHOEAE BY PCR: NEGATIVE

## 2016-04-26 ENCOUNTER — Telehealth: Payer: Self-pay | Admitting: Women's Health

## 2016-04-26 MED ORDER — METRONIDAZOLE 0.75 % VA GEL: 1.0000 | Freq: Every day | VAGINAL | Status: DC

## 2016-04-26 NOTE — Telephone Encounter (Signed)
Pt states taking the Metronidazole prescribed, making her "really sick, cramping." Pt states stopped taking yesterday due to the cramping. Is there an alternative?

## 2016-05-01 ENCOUNTER — Ambulatory Visit (INDEPENDENT_AMBULATORY_CARE_PROVIDER_SITE_OTHER): Payer: Medicare Other | Admitting: Obstetrics & Gynecology

## 2016-05-01 ENCOUNTER — Encounter: Payer: Self-pay | Admitting: Obstetrics & Gynecology

## 2016-05-01 ENCOUNTER — Encounter: Payer: Medicare Other | Admitting: Obstetrics & Gynecology

## 2016-05-01 VITALS — BP 100/60 | HR 92 | Temp 98.2°F | Wt 167.0 lb

## 2016-05-01 DIAGNOSIS — Z331 Pregnant state, incidental: Secondary | ICD-10-CM | POA: Diagnosis not present

## 2016-05-01 DIAGNOSIS — Z1389 Encounter for screening for other disorder: Secondary | ICD-10-CM

## 2016-05-01 DIAGNOSIS — Z3A25 25 weeks gestation of pregnancy: Secondary | ICD-10-CM | POA: Diagnosis not present

## 2016-05-01 DIAGNOSIS — J40 Bronchitis, not specified as acute or chronic: Secondary | ICD-10-CM | POA: Diagnosis not present

## 2016-05-01 LAB — POCT URINALYSIS DIPSTICK
Blood, UA: NEGATIVE
GLUCOSE UA: NEGATIVE
Ketones, UA: NEGATIVE
Leukocytes, UA: NEGATIVE
Nitrite, UA: NEGATIVE

## 2016-05-01 MED ORDER — AZITHROMYCIN 250 MG PO TABS: ORAL_TABLET | ORAL | Status: DC

## 2016-05-01 NOTE — Progress Notes (Signed)
[employer] in [employer]  Pt had a GI virus and could not take her metronidazole orally but may have been related to her N/V/D also has a history of reactive airway disease and has been increasing cough productive  Exam +wheezes some course airway sounds  Encouraged to stop smoking zithromax z pak Use inhalers as needed  Follow up as scheduled     Face to face time:  15 minutes  Greater than 50% of the visit time was spent in counseling and coordination of care with the patient.  The summary and outline of the counseling and care coordination is summarized in the note above.   All questions were answered.

## 2016-05-16 ENCOUNTER — Other Ambulatory Visit: Payer: Medicare Other

## 2016-05-16 ENCOUNTER — Encounter: Payer: Medicare Other | Admitting: Women's Health

## 2016-05-17 ENCOUNTER — Encounter: Payer: Medicare Other | Admitting: Obstetrics and Gynecology

## 2016-05-17 ENCOUNTER — Other Ambulatory Visit: Payer: Medicare Other

## 2016-05-17 DIAGNOSIS — Z131 Encounter for screening for diabetes mellitus: Secondary | ICD-10-CM | POA: Diagnosis not present

## 2016-05-17 DIAGNOSIS — Z369 Encounter for antenatal screening, unspecified: Secondary | ICD-10-CM

## 2016-05-17 DIAGNOSIS — Z36 Encounter for antenatal screening of mother: Secondary | ICD-10-CM | POA: Diagnosis not present

## 2016-05-18 LAB — ANTIBODY SCREEN: Antibody Screen: NEGATIVE

## 2016-05-18 LAB — GLUCOSE TOLERANCE, 2 HOURS W/ 1HR
Glucose, 1 hour: 118 mg/dL (ref 65–179)
Glucose, 2 hour: 94 mg/dL (ref 65–152)
Glucose, Fasting: 75 mg/dL (ref 65–91)

## 2016-05-18 LAB — RPR: RPR: NONREACTIVE

## 2016-05-23 ENCOUNTER — Encounter: Payer: Self-pay | Admitting: Women's Health

## 2016-05-23 ENCOUNTER — Encounter: Payer: Medicare Other | Admitting: Women's Health

## 2016-05-23 ENCOUNTER — Ambulatory Visit (INDEPENDENT_AMBULATORY_CARE_PROVIDER_SITE_OTHER): Payer: Medicare Other | Admitting: Advanced Practice Midwife

## 2016-05-23 VITALS — BP 108/70 | HR 94 | Wt 171.5 lb

## 2016-05-23 DIAGNOSIS — Z1389 Encounter for screening for other disorder: Secondary | ICD-10-CM

## 2016-05-23 DIAGNOSIS — Z3493 Encounter for supervision of normal pregnancy, unspecified, third trimester: Secondary | ICD-10-CM

## 2016-05-23 DIAGNOSIS — Z331 Pregnant state, incidental: Secondary | ICD-10-CM

## 2016-05-23 LAB — POCT URINALYSIS DIPSTICK
Blood, UA: NEGATIVE
Glucose, UA: NEGATIVE
Ketones, UA: NEGATIVE
Leukocytes, UA: NEGATIVE
Nitrite, UA: NEGATIVE
Protein, UA: NEGATIVE

## 2016-05-23 NOTE — Progress Notes (Signed)
[id number] 5255w1d Estimated Date of Delivery: 08/14/16  Blood pressure 108/70, pulse 94, weight 171 lb 8 oz (77.792 kg), last menstrual period 11/08/2015.   BP weight and urine results all reviewed and noted.  Please refer to the obstetrical flow sheet for the fundal height and fetal heart rate documentation:  Patient reports good fetal movement, denies any bleeding and no rupture of membranes symptoms or regular contractions. Patient is without complaints. All questions were answered.  Orders Placed This Encounter  Procedures  . POCT urinalysis dipstick    Plan:  Continued routine obstetrical care,   Return in about 3 weeks (around 06/13/2016) for LROB.

## 2016-06-20 ENCOUNTER — Encounter: Payer: Self-pay | Admitting: Women's Health

## 2016-06-20 ENCOUNTER — Ambulatory Visit (INDEPENDENT_AMBULATORY_CARE_PROVIDER_SITE_OTHER): Payer: Medicare Other | Admitting: Women's Health

## 2016-06-20 VITALS — BP 108/62 | HR 92 | Wt 174.0 lb

## 2016-06-20 DIAGNOSIS — Z1389 Encounter for screening for other disorder: Secondary | ICD-10-CM

## 2016-06-20 DIAGNOSIS — Z3493 Encounter for supervision of normal pregnancy, unspecified, third trimester: Secondary | ICD-10-CM

## 2016-06-20 DIAGNOSIS — Z3483 Encounter for supervision of other normal pregnancy, third trimester: Secondary | ICD-10-CM | POA: Diagnosis not present

## 2016-06-20 DIAGNOSIS — Z331 Pregnant state, incidental: Secondary | ICD-10-CM

## 2016-06-20 DIAGNOSIS — Z369 Encounter for antenatal screening, unspecified: Secondary | ICD-10-CM

## 2016-06-20 DIAGNOSIS — Z3A32 32 weeks gestation of pregnancy: Secondary | ICD-10-CM

## 2016-06-20 DIAGNOSIS — Z36 Encounter for antenatal screening of mother: Secondary | ICD-10-CM | POA: Diagnosis not present

## 2016-06-20 LAB — POCT URINALYSIS DIPSTICK
Blood, UA: NEGATIVE
Glucose, UA: NEGATIVE
KETONES UA: NEGATIVE
LEUKOCYTES UA: NEGATIVE
Nitrite, UA: NEGATIVE

## 2016-06-20 NOTE — Progress Notes (Signed)
Low-risk OB appointment [id number] 4070w1d Estimated Date of Delivery: 08/14/16 BP 108/62 mmHg  Pulse 92  Wt 174 lb (78.926 kg)  LMP 11/08/2015 (Exact Date)  BP, weight, and urine reviewed.  Refer to obstetrical flow sheet for FH & FHR.  Reports good fm.  Denies regular uc's, lof, vb, or uti s/s. No complaints. Wants interval BTL, discussed risks/benefits, consent signed Reviewed pn2 results, ptl s/s, fkc.  Recommended Tdap at HD/PCP per CDC guidelines.  Plan:  Continue routine obstetrical care  F/U in 2wks for OB appointment

## 2016-06-20 NOTE — Patient Instructions (Signed)

## 2016-06-21 ENCOUNTER — Other Ambulatory Visit: Payer: Self-pay | Admitting: Women's Health

## 2016-06-21 ENCOUNTER — Telehealth: Payer: Self-pay | Admitting: *Deleted

## 2016-06-21 LAB — CBC
Hematocrit: 31.7 % — ABNORMAL LOW (ref 34.0–46.6)
Hemoglobin: 10.6 g/dL — ABNORMAL LOW (ref 11.1–15.9)
MCH: 30.1 pg (ref 26.6–33.0)
MCHC: 33.4 g/dL (ref 31.5–35.7)
MCV: 90 fL (ref 79–97)
PLATELETS: 234 10*3/uL (ref 150–379)
RBC: 3.52 x10E6/uL — ABNORMAL LOW (ref 3.77–5.28)
RDW: 14 % (ref 12.3–15.4)
WBC: 9.7 10*3/uL (ref 3.4–10.8)

## 2016-06-21 MED ORDER — FERROUS SULFATE 325 (65 FE) MG PO TABS: 325.0000 mg | ORAL_TABLET | Freq: Two times a day (BID) | ORAL | Status: DC

## 2016-06-21 NOTE — Telephone Encounter (Signed)
Pt informed per Joellyn HaffKim Booker, DX of anemia, FE supplement e-scribed to [facility], [place], increase foods rich in iron (red meat, green leafy veg, beans), take PNV daily. Pt verbalized understanding.

## 2016-07-04 ENCOUNTER — Ambulatory Visit (INDEPENDENT_AMBULATORY_CARE_PROVIDER_SITE_OTHER): Payer: Medicare Other | Admitting: Women's Health

## 2016-07-04 ENCOUNTER — Encounter: Payer: Self-pay | Admitting: Women's Health

## 2016-07-04 VITALS — BP 102/64 | HR 92 | Wt 171.0 lb

## 2016-07-04 DIAGNOSIS — Z2839 Other underimmunization status: Secondary | ICD-10-CM

## 2016-07-04 DIAGNOSIS — O09899 Supervision of other high risk pregnancies, unspecified trimester: Secondary | ICD-10-CM | POA: Insufficient documentation

## 2016-07-04 DIAGNOSIS — Z331 Pregnant state, incidental: Secondary | ICD-10-CM

## 2016-07-04 DIAGNOSIS — Z1389 Encounter for screening for other disorder: Secondary | ICD-10-CM

## 2016-07-04 DIAGNOSIS — Z3A34 34 weeks gestation of pregnancy: Secondary | ICD-10-CM

## 2016-07-04 DIAGNOSIS — O9989 Other specified diseases and conditions complicating pregnancy, childbirth and the puerperium: Secondary | ICD-10-CM

## 2016-07-04 DIAGNOSIS — Z283 Underimmunization status: Secondary | ICD-10-CM | POA: Insufficient documentation

## 2016-07-04 DIAGNOSIS — Z3483 Encounter for supervision of other normal pregnancy, third trimester: Secondary | ICD-10-CM

## 2016-07-04 LAB — POCT URINALYSIS DIPSTICK
GLUCOSE UA: NEGATIVE
Ketones, UA: NEGATIVE
NITRITE UA: NEGATIVE
Protein, UA: NEGATIVE
RBC UA: NEGATIVE

## 2016-07-04 NOTE — Patient Instructions (Signed)
Call the office ([phone]) or go to [facility] if:  You begin to have strong, frequent contractions  Your water breaks.  Sometimes it is a big gush of fluid, sometimes it is just a trickle that keeps getting your panties wet or running down your legs  You have vaginal bleeding.  It is normal to have a small amount of spotting if your cervix was checked.   You don't feel your baby moving like normal.  If you don't, get you something to eat and drink and lay down and focus on feeling your baby move.  You should feel at least 10 movements in 2 hours.  If you don't, you should call the office or go to [facility].    Preterm Labor Information Preterm labor is when labor starts at less than 37 weeks of pregnancy. The normal length of a pregnancy is 39 to 41 weeks. CAUSES Often, there is no identifiable underlying cause as to why a woman goes into preterm labor. One of the most common known causes of preterm labor is infection. Infections of the uterus, cervix, vagina, amniotic sac, bladder, kidney, or even the lungs (pneumonia) can cause labor to start. Other suspected causes of preterm labor include:   Urogenital infections, such as yeast infections and bacterial vaginosis.   Uterine abnormalities (uterine shape, uterine septum, fibroids, or bleeding from the placenta).   A cervix that has been operated on (it may fail to stay closed).   Malformations in the fetus.   Multiple gestations (twins, triplets, and so on).   Breakage of the amniotic sac.  RISK FACTORS  Having a previous history of preterm labor.   Having premature rupture of membranes (PROM).   Having a placenta that covers the opening of the cervix (placenta previa).   Having a placenta that separates from the uterus (placental abruption).   Having a cervix that is too weak to hold the fetus in the uterus (incompetent cervix).   Having too much fluid in the amniotic sac (polyhydramnios).   Taking  illegal drugs or smoking while pregnant.   Not gaining enough weight while pregnant.   Being younger than 18 and older than 30 years old.   Having a low socioeconomic status.   Being African American. SYMPTOMS Signs and symptoms of preterm labor include:   Menstrual-like cramps, abdominal pain, or back pain.  Uterine contractions that are regular, as frequent as six in an hour, regardless of their intensity (may be mild or painful).  Contractions that start on the top of the uterus and spread down to the lower abdomen and back.   A sense of increased pelvic pressure.   A watery or bloody mucus discharge that comes from the vagina.  TREATMENT Depending on the length of the pregnancy and other circumstances, your health care provider may suggest bed rest. If necessary, there are medicines that can be given to stop contractions and to mature the fetal lungs. If labor happens before 34 weeks of pregnancy, a prolonged hospital stay may be recommended. Treatment depends on the condition of both you and the fetus.  WHAT SHOULD YOU DO IF YOU THINK YOU ARE IN PRETERM LABOR? Call your health care provider right away. You will need to go to the hospital to get checked immediately. HOW CAN YOU PREVENT PRETERM LABOR IN FUTURE PREGNANCIES? You should:   Stop smoking if you smoke.  Maintain healthy weight gain and avoid chemicals and drugs that are not necessary.  Be watchful for   any type of infection.  Inform your health care provider if you have a known history of preterm labor.   This information is not intended to replace advice given to you by your health care provider. Make sure you discuss any questions you have with your health care provider.   Document Released: 02/17/2004 Document Revised: 07/30/2013 Document Reviewed: 12/30/2012 Elsevier Interactive Patient Education 2016 Elsevier Inc.  

## 2016-07-04 NOTE — Progress Notes (Signed)
Low-risk OB appointment [id number] [redacted]w[redacted]d Estimated Date of Delivery: 08/14/16 BP 102/64   Pulse 92   Wt 171 lb (77.6 kg)   LMP 11/08/2015 (Exact Date)   BMI 29.35 kg/m   BP, weight, and urine reviewed.  Refer to obstetrical flow sheet for FH & FHR.  Reports good fm.  Denies regular uc's, lof, vb, or uti s/s. Just got back from vacation, feels like ears are in a bucket and can't pop them. No pain. Can try decongestant.  Reviewed ptl s/s, fkc. Plan:  Continue routine obstetrical care  F/U in 2wks for OB appointment

## 2016-07-13 ENCOUNTER — Inpatient Hospital Stay (HOSPITAL_COMMUNITY)
Admission: AD | Admit: 2016-07-13 | Discharge: 2016-07-13 | Disposition: A | Discharge status: Home / Self Care | Payer: Medicare Other | Source: Ambulatory Visit | Attending: Family Medicine | Admitting: Family Medicine

## 2016-07-13 ENCOUNTER — Encounter (HOSPITAL_COMMUNITY): Payer: Self-pay | Admitting: *Deleted

## 2016-07-13 DIAGNOSIS — O99333 Smoking (tobacco) complicating pregnancy, third trimester: Secondary | ICD-10-CM | POA: Insufficient documentation

## 2016-07-13 DIAGNOSIS — F17210 Nicotine dependence, cigarettes, uncomplicated: Secondary | ICD-10-CM | POA: Diagnosis not present

## 2016-07-13 DIAGNOSIS — J45909 Unspecified asthma, uncomplicated: Secondary | ICD-10-CM | POA: Insufficient documentation

## 2016-07-13 DIAGNOSIS — Z3493 Encounter for supervision of normal pregnancy, unspecified, third trimester: Secondary | ICD-10-CM

## 2016-07-13 DIAGNOSIS — O99513 Diseases of the respiratory system complicating pregnancy, third trimester: Secondary | ICD-10-CM | POA: Diagnosis not present

## 2016-07-13 DIAGNOSIS — O4703 False labor before 37 completed weeks of gestation, third trimester: Secondary | ICD-10-CM | POA: Insufficient documentation

## 2016-07-13 DIAGNOSIS — Z3A35 35 weeks gestation of pregnancy: Secondary | ICD-10-CM | POA: Diagnosis not present

## 2016-07-13 DIAGNOSIS — R109 Unspecified abdominal pain: Secondary | ICD-10-CM | POA: Diagnosis present

## 2016-07-13 DIAGNOSIS — O479 False labor, unspecified: Secondary | ICD-10-CM

## 2016-07-13 LAB — URINALYSIS, ROUTINE W REFLEX MICROSCOPIC
Bilirubin Urine: NEGATIVE
GLUCOSE, UA: NEGATIVE mg/dL
HGB URINE DIPSTICK: NEGATIVE
Ketones, ur: NEGATIVE mg/dL
LEUKOCYTES UA: NEGATIVE
Nitrite: NEGATIVE
PH: 7.5 (ref 5.0–8.0)
PROTEIN: NEGATIVE mg/dL
Specific Gravity, Urine: 1.015 (ref 1.005–1.030)

## 2016-07-13 LAB — COMPREHENSIVE METABOLIC PANEL
ALBUMIN: 3 g/dL — AB (ref 3.5–5.0)
ALK PHOS: 129 U/L — AB (ref 38–126)
ALT: 11 U/L — ABNORMAL LOW (ref 14–54)
ANION GAP: 9 (ref 5–15)
AST: 19 U/L (ref 15–41)
BILIRUBIN TOTAL: 0.4 mg/dL (ref 0.3–1.2)
BUN: 6 mg/dL (ref 6–20)
CALCIUM: 8.7 mg/dL — AB (ref 8.9–10.3)
CO2: 20 mmol/L — ABNORMAL LOW (ref 22–32)
Chloride: 104 mmol/L (ref 101–111)
Creatinine, Ser: 0.59 mg/dL (ref 0.44–1.00)
Glucose, Bld: 92 mg/dL (ref 65–99)
POTASSIUM: 3.6 mmol/L (ref 3.5–5.1)
Sodium: 133 mmol/L — ABNORMAL LOW (ref 135–145)
TOTAL PROTEIN: 6.8 g/dL (ref 6.5–8.1)

## 2016-07-13 LAB — CBC
HEMATOCRIT: 32.4 % — AB (ref 36.0–46.0)
HEMOGLOBIN: 10.6 g/dL — AB (ref 12.0–15.0)
MCH: 29.4 pg (ref 26.0–34.0)
MCHC: 32.7 g/dL (ref 30.0–36.0)
MCV: 90 fL (ref 78.0–100.0)
Platelets: 233 10*3/uL (ref 150–400)
RBC: 3.6 MIL/uL — ABNORMAL LOW (ref 3.87–5.11)
RDW: 15 % (ref 11.5–15.5)
WBC: 13.6 10*3/uL — ABNORMAL HIGH (ref 4.0–10.5)

## 2016-07-13 MED ORDER — OMEPRAZOLE 20 MG PO CPDR: 20.0000 mg | DELAYED_RELEASE_CAPSULE | Freq: Every day | ORAL | 1 refills | Status: DC

## 2016-07-13 MED ORDER — GI COCKTAIL ~~LOC~~
30.0000 mL | Freq: Once | ORAL | Status: AC
  Administered 2016-07-13: 30 mL via ORAL
  Filled 2016-07-13: qty 30

## 2016-07-13 NOTE — MAU Provider Note (Signed)
First Provider Initiated Contact with Patient 07/13/16 2204      Chief Complaint:  Abdominal Cramping   Mary [name] is  30 y.o. [id number] at [redacted]w[redacted]d presents complaining of Abdominal Cramping .  She called office today w/epigastric pain, responded nicely to 2 Tums.  Now has some cramping, concerned it is labor. Having a little epigastric pain again.   Obstetrical/Gynecological History: OB History    Gravida Para Term Preterm AB Living   5 3 3   1 3    SAB TAB Ectopic Multiple Live Births   1       3     Past Medical History: Past Medical History:  Diagnosis Date  . Asthma   . Bipolar depression (HCC)   . Bronchitis 01/31/2016  . Depression   . Headache(784.0)   . Mental disorder   . Post traumatic stress disorder (PTSD)   . Pregnant 01/21/2016  . Sinus infection 01/31/2016  . Supervision of normal pregnancy, antepartum 02/08/2016    [facility] at             13+1 week FOB   Ursa Marchesi 30 yo WM Dating By  LMP and [place] Pap  02/08/16 GC/CT Initial:                36+wks: Genetic Screen NT/IT:  CF screen  Anatomic [place]  Flu vaccine  Tdap Recommended ~ 28wks Glucose Screen  2 hr GBS  Feed Preference  Contraception  Circumcision  Childbirth Classes  Pediatrician      Past Surgical History: History reviewed. No pertinent surgical history.  Family History: Family History  Problem Relation Age of Onset  . Cancer Mother 54    breast  . Hypertension Mother   . Depression Mother   . Depression Father   . Seizures Father   . Cancer Maternal Grandmother     breast and cervical  . Asthma Son   . Asthma Son   . Hypertension Other   . Cancer Other     Social History: Social History  Substance Use Topics  . Smoking status: Current Some Day Smoker    Packs/day: 0.50    Types: Cigarettes  . Smokeless tobacco: Never Used     Comment: NEVER USE SNUFF OR CHEWING TOBACCO  . Alcohol use No    Allergies:  Allergies  Allergen Reactions  . Codeine Shortness Of  Breath    unknown  . Peanut-Containing Drug Products Shortness Of Breath  . Sulfa Antibiotics Anaphylaxis and Itching    Meds:  Prescriptions Prior to Admission  Medication Sig Dispense Refill Last Dose  . calcium carbonate (TUMS - DOSED IN MG ELEMENTAL CALCIUM) 500 MG chewable tablet Chew 1 tablet by mouth daily.   07/13/2016 at 1330  . acetaminophen (TYLENOL) 325 MG tablet Take 650 mg by mouth every 6 (six) hours as needed. Reported on 05/01/2016   Taking  . albuterol (PROVENTIL HFA;VENTOLIN HFA) 108 (90 BASE) MCG/ACT inhaler Inhale 1-2 puffs into the lungs every 6 (six) hours as needed for wheezing or shortness of breath. 1 Inhaler 0 Taking  . albuterol (PROVENTIL) (2.5 MG/3ML) 0.083% nebulizer solution Take 3 mLs by nebulization every 6 (six) hours as needed for wheezing or shortness of breath. Reported on 06/20/2016  0 Taking  . ferrous sulfate 325 (65 FE) MG tablet Take 1 tablet (325 mg total) by mouth 2 (two) times daily with a meal. 60 tablet 3 Taking  . flintstones complete (FLINTSTONES) 60 MG  chewable tablet Take 2 a day 60 tablet  Taking  . fluticasone (FLONASE) 50 MCG/ACT nasal spray Place into both nostrils daily.   Taking  . montelukast (SINGULAIR) 10 MG tablet Take 1 tablet (10 mg total) by mouth at bedtime. 30 tablet 1 Taking    Review of Systems   Constitutional: Negative for fever and chills Eyes: Negative for visual disturbances Respiratory: Negative for shortness of breath, dyspnea Cardiovascular: Negative for chest pain or palpitations  Gastrointestinal: Negative for vomiting, diarrhea and constipation Genitourinary: Negative for dysuria and urgency Musculoskeletal: Negative for back pain, joint pain, myalgias.  Normal ROM  Neurological: Negative for dizziness and headaches    Physical Exam  Blood pressure 108/65, pulse 93, temperature 98.4 F (36.9 C), resp. rate 20, height  (1.626 m), weight 79.4 kg (175 lb), last menstrual period 11/08/2015. GENERAL:  Well-developed, well-nourished female in no acute distress.  LUNGS: Clear to auscultation bilaterally.  HEART: Regular rate and rhythm. ABDOMEN: Soft, nontender, nondistended, gravid.  EXTREMITIES: Nontender, no edema, 2+ distal pulses. DTR's 2+ CERVICAL EXAM: Dilatation 1-2cm   Effacement 0%   Station -3   Presentation: cephalic FHT:  Baseline rate 145  bpm   Variability moderate  Accelerations present   Decelerations none Contractions:occasional and mild   Labs: Results for orders placed or performed during the hospital encounter of 07/13/16 (from the past 24 hour(s))  Urinalysis, Routine w reflex microscopic (not at [facility])   Collection Time: 07/13/16  9:46 PM  Result Value Ref Range   Color, Urine YELLOW YELLOW   APPearance CLEAR CLEAR   Specific Gravity, Urine 1.015 1.005 - 1.030   pH 7.5 5.0 - 8.0   Glucose, UA NEGATIVE NEGATIVE mg/dL   Hgb urine dipstick NEGATIVE NEGATIVE   Bilirubin Urine NEGATIVE NEGATIVE   Ketones, ur NEGATIVE NEGATIVE mg/dL   Protein, ur NEGATIVE NEGATIVE mg/dL   Nitrite NEGATIVE NEGATIVE   Leukocytes, UA NEGATIVE NEGATIVE   Imaging Studies:  No results found.  Assessment: Mary [name] is  30 y.o. [phone] at [redacted]w[redacted]d presents with Braxton Hicks/heartburn.  Plan: GI cocktail (relieved abd pain--rx prilosec); labor precautions  CRESENZO-DISHMAN,Trai Ells 8/3/201710:17 PM

## 2016-07-13 NOTE — MAU Note (Signed)
PT  SAYS     SINCE  Tuesday  - HAD UC'S.   THEN  8 AM  TODAY   SHE  FELT  SHARP PAIN   IN UPPER ABD -    WALKED , ATE- THEN  VOMITED,     .   AT 1 PM -    CALLED   FRAN-    TOOK 2  TUMS.      PAIN HAS  EASED UP   .    FEELS   CRAMP IN RIGHT  LOWER  LEG-.   SAYS   HAS BEEN IN BED  MOST  OF  DAY-  BECAUSE  OF     UPPER  ABD  PAIN.         LESS PAIN  NOW.        VE IN OFFICE    -   CLOSED.      LAST SEX-    Tuesday.

## 2016-07-13 NOTE — Discharge Instructions (Signed)
Braxton Hicks Contractions °Contractions of the uterus can occur throughout pregnancy. Contractions are not always a sign that you are in labor.  °WHAT ARE BRAXTON HICKS CONTRACTIONS?  °Contractions that occur before labor are called Braxton Hicks contractions, or false labor. Toward the end of pregnancy (32-34 weeks), these contractions can develop more often and may become more forceful. This is not true labor because these contractions do not result in opening (dilatation) and thinning of the cervix. They are sometimes difficult to tell apart from true labor because these contractions can be forceful and people have different pain tolerances. You should not feel embarrassed if you go to the hospital with false labor. Sometimes, the only way to tell if you are in true labor is for your health care provider to look for changes in the cervix. °If there are no prenatal problems or other health problems associated with the pregnancy, it is completely safe to be sent home with false labor and await the onset of true labor. °HOW CAN YOU TELL THE DIFFERENCE BETWEEN TRUE AND FALSE LABOR? °False Labor °· The contractions of false labor are usually shorter and not as hard as those of true labor.   °· The contractions are usually irregular.   °· The contractions are often felt in the front of the lower abdomen and in the groin.   °· The contractions may go away when you walk around or change positions while lying down.   °· The contractions get weaker and are shorter lasting as time goes on.   °· The contractions do not usually become progressively stronger, regular, and closer together as with true labor.   °True Labor °· Contractions in true labor last 30-70 seconds, become very regular, usually become more intense, and increase in frequency.   °· The contractions do not go away with walking.   °· The discomfort is usually felt in the top of the uterus and spreads to the lower abdomen and low back.   °· True labor can be  determined by your health care provider with an exam. This will show that the cervix is dilating and getting thinner.   °WHAT TO REMEMBER °· Keep up with your usual exercises and follow other instructions given by your health care provider.   °· Take medicines as directed by your health care provider.   °· Keep your regular prenatal appointments.   °· Eat and drink lightly if you think you are going into labor.   °· If Braxton Hicks contractions are making you uncomfortable:   °¨ Change your position from lying down or resting to walking, or from walking to resting.   °¨ Sit and rest in a tub of warm water.   °¨ Drink 2-3 glasses of water. Dehydration may cause these contractions.   °¨ Do slow and deep breathing several times an hour.   °WHEN SHOULD I SEEK IMMEDIATE MEDICAL CARE? °Seek immediate medical care if: °· Your contractions become stronger, more regular, and closer together.   °· You have fluid leaking or gushing from your vagina.   °· You have a fever.   °· You pass blood-tinged mucus.   °· You have vaginal bleeding.   °· You have continuous abdominal pain.   °· You have low back pain that you never had before.   °· You feel your baby's head pushing down and causing pelvic pressure.   °· Your baby is not moving as much as it used to.   °  °This information is not intended to replace advice given to you by your health care provider. Make sure you discuss any questions you have with your health care   provider. °  °Document Released: 11/27/2005 Document Revised: 12/02/2013 Document Reviewed: 09/08/2013 °Elsevier Interactive Patient Education ©2016 Elsevier Inc. ° °

## 2016-07-18 ENCOUNTER — Encounter: Payer: Self-pay | Admitting: Obstetrics and Gynecology

## 2016-07-18 ENCOUNTER — Ambulatory Visit (INDEPENDENT_AMBULATORY_CARE_PROVIDER_SITE_OTHER): Payer: Medicare Other | Admitting: Obstetrics and Gynecology

## 2016-07-18 VITALS — BP 120/80 | HR 84 | Wt 175.0 lb

## 2016-07-18 DIAGNOSIS — Z1389 Encounter for screening for other disorder: Secondary | ICD-10-CM

## 2016-07-18 DIAGNOSIS — Z331 Pregnant state, incidental: Secondary | ICD-10-CM

## 2016-07-18 DIAGNOSIS — Z3493 Encounter for supervision of normal pregnancy, unspecified, third trimester: Secondary | ICD-10-CM

## 2016-07-18 LAB — POCT URINALYSIS DIPSTICK
GLUCOSE UA: NEGATIVE
Ketones, UA: NEGATIVE
Leukocytes, UA: NEGATIVE
NITRITE UA: NEGATIVE
Protein, UA: NEGATIVE
RBC UA: NEGATIVE

## 2016-07-18 NOTE — Progress Notes (Signed)
Patient ID: Mary [name], female   DOB: [birth year], 30 y.o.   MRN: [id number]  [id number]  Estimated Date of Delivery: 08/14/16 LROB 2054w1d  Blood pressure 120/80, pulse 84, weight 175 lb (79.4 kg), last menstrual period 11/08/2015.    Urine results: notable for none  Chief Complaint  Patient presents with  . Routine Prenatal Visit    Patient complaints: none. Pt states [place] 3 [place]. She denies any pelvic pressure.   Patient reports good fetal movement. She denies any bleeding, rupture of membranes, or regular contractions.   Refer to the ob flow sheet for FH and FHR,                           Physical Examination: General appearance - alert, well appearing, and in no distress                                      Abdomen - FH 36 cm,                                                         -FHR 164 bpm                                                         soft, nontender, nondistended, no masses or organomegaly, fetus vertex                                              Questions were answered. Assessment: LROB [id number] @ 954w1d Pt advised that her EMR will be available to her should she go into labor while out of town.   Plan:   Continued routine obstetrical care Cervix check at next visit to advise pt for vacationpt made specifically aware that we cannot guarantee that she would be able to return from beach in a timely fashion if labor develops. 3 hrs away   F/u in 1 week for routine prenatal care, cervix check    By signing my name below, I, Doreatha MartinEva Mathews, attest that this documentation has been prepared under the direction and in the presence of Tilda BurrowJohn V Croy Drumwright, MD. Electronically Signed: Doreatha MartinEva Mathews, ED Scribe. 07/18/16. 11:52 AM.  I personally performed the services described in this documentation, which was SCRIBED in my presence. The recorded information has been reviewed and considered accurate. It has been edited as necessary during  review. Tilda BurrowFERGUSON,Annalyce Lanpher V, MD

## 2016-07-18 NOTE — Progress Notes (Signed)
Pt denies any problems or concerns at this time.  

## 2016-07-25 ENCOUNTER — Ambulatory Visit (INDEPENDENT_AMBULATORY_CARE_PROVIDER_SITE_OTHER): Payer: Medicare Other | Admitting: Women's Health

## 2016-07-25 ENCOUNTER — Encounter: Payer: Self-pay | Admitting: Women's Health

## 2016-07-25 VITALS — BP 108/68 | HR 88 | Wt 175.0 lb

## 2016-07-25 DIAGNOSIS — Z113 Encounter for screening for infections with a predominantly sexual mode of transmission: Secondary | ICD-10-CM | POA: Diagnosis not present

## 2016-07-25 DIAGNOSIS — Z118 Encounter for screening for other infectious and parasitic diseases: Secondary | ICD-10-CM | POA: Diagnosis not present

## 2016-07-25 DIAGNOSIS — Z1159 Encounter for screening for other viral diseases: Secondary | ICD-10-CM | POA: Diagnosis not present

## 2016-07-25 DIAGNOSIS — Z36 Encounter for antenatal screening of mother: Secondary | ICD-10-CM | POA: Diagnosis not present

## 2016-07-25 DIAGNOSIS — Z331 Pregnant state, incidental: Secondary | ICD-10-CM

## 2016-07-25 DIAGNOSIS — Z3685 Encounter for antenatal screening for Streptococcus B: Secondary | ICD-10-CM

## 2016-07-25 DIAGNOSIS — Z3493 Encounter for supervision of normal pregnancy, unspecified, third trimester: Secondary | ICD-10-CM

## 2016-07-25 DIAGNOSIS — Z1389 Encounter for screening for other disorder: Secondary | ICD-10-CM

## 2016-07-25 LAB — POCT URINALYSIS DIPSTICK
Glucose, UA: NEGATIVE
Ketones, UA: NEGATIVE
Leukocytes, UA: NEGATIVE
Nitrite, UA: NEGATIVE
PROTEIN UA: NEGATIVE
RBC UA: NEGATIVE

## 2016-07-25 LAB — OB RESULTS CONSOLE GBS: STREP GROUP B AG: NEGATIVE

## 2016-07-25 NOTE — Progress Notes (Signed)
Low-risk OB appointment [id number] 6554w1d Estimated Date of Delivery: 08/14/16 BP 108/68   Pulse 88   Wt 175 lb (79.4 kg)   LMP 11/08/2015 (Exact Date)   BMI 30.04 kg/m   BP, weight, and urine reviewed.  Refer to obstetrical flow sheet for FH & FHR.  Reports good fm.  Denies regular uc's, lof, vb, or uti s/s. No complaints. Has trip planned for this weekend to go to beach, her mother and grandmother are there and states it's important that she goes. Discussed generally not recommended to travel this [facility] d/t potential to go into labor, unless she is ok w/ delivering at the beach, she says she would rather not but is ok if it happens. Discussed she is at a high risk for DVT/PE when pregnancy and traveling increases that risk. Recommended walking around q 1hr, don't cross legs, and dorsiflex feet often to help decrease r/f DVT/PE. Get copy of records to take on trip.     GBS, gc/ct collected SVE per request: 3/50/-2, vtx Reviewed labor s/s, fkc. Plan:  Continue routine obstetrical care  F/U in 1wk for OB appointment

## 2016-07-25 NOTE — Patient Instructions (Signed)
Tips to Help You Sleep Better:   Get into a bedtime routine, try to do the same thing every night before going to bed to try to help your body wind down  Warm baths  Avoid caffeine for at least 3 hours before going to sleep   Keep your room at a slightly cooler temperature, can try running a fan  Turn off TV, lights, phone, electronics  Lots of pillows if needed to help you get comfortable  Lavender scented items can help you sleep. You can place lavender essential oil on a cotton ball and place under your pillowcase, or place in a diffuser. [employer] has a lavender scented sleep line (plug-ins, sprays, etc). Look in the pillow aisle for lavender scented pillows.   If none of the above things help, you can try 1/2 to 1 tablet of benadryl, unisom, or tylenol pm. Do not take this every night, only when you really need it.    Call the office [phone]) or [facility] to [facility] if:  You begin to have strong, frequent contractions  Your water breaks.  Sometimes it is a big gush of fluid, sometimes it is just a trickle that keeps getting your panties wet or running down your legs  You have vaginal bleeding.  It is normal to have a small amount of spotting if your cervix was checked.   You don't feel your baby moving like normal.  If you don't, get you something to eat and drink and lay down and focus on feeling your baby move.  You should feel at least 10 movements in 2 hours.  If you don't, you should call the office or [facility].     [facility] Contractions of the uterus can occur throughout pregnancy. Contractions are not always a sign that you are in labor.  WHAT ARE BRAXTON HICKS CONTRACTIONS?  Contractions that occur before labor are called Braxton Hicks contractions, or false labor. Toward the end of pregnancy (32-34 weeks), these contractions can develop more often and may become more forceful. This is not true labor because these contractions do not  result in opening (dilatation) and thinning of the cervix. They are sometimes difficult to tell apart from true labor because these contractions can be forceful and people have different pain tolerances. You should not feel embarrassed if you go to the hospital with false labor. Sometimes, the only way to tell if you are in true labor is for your health care provider to look for changes in the cervix. If there are no prenatal problems or other health problems associated with the pregnancy, it is completely safe to be sent home with false labor and await the onset of true labor. HOW CAN YOU TELL THE DIFFERENCE BETWEEN TRUE AND FALSE LABOR? False Labor  The contractions of false labor are usually shorter and not as hard as those of true labor.   The contractions are usually irregular.   The contractions are often felt in the front of the lower abdomen and in the groin.   The contractions may go away when you walk around or change positions while lying down.   The contractions get weaker and are shorter lasting as time goes on.   The contractions do not usually become progressively stronger, regular, and closer together as with true labor.  True Labor  Contractions in true labor last 30-70 seconds, become very regular, usually become more intense, and increase in frequency.   The contractions do not  go away with walking.   The discomfort is usually felt in the top of the uterus and spreads to the lower abdomen and low back.   True labor can be determined by your health care provider with an exam. This will show that the cervix is dilating and getting thinner.  WHAT TO REMEMBER  Keep up with your usual exercises and follow other instructions given by your health care provider.   Take medicines as directed by your health care provider.   Keep your regular prenatal appointments.   Eat and drink lightly if you think you are going into labor.   If Braxton Hicks contractions  are making you uncomfortable:   Change your position from lying down or resting to walking, or from walking to resting.   Sit and rest in a tub of warm water.   Drink 2-3 glasses of water. Dehydration may cause these contractions.   Do slow and deep breathing several times an hour.  WHEN SHOULD I SEEK IMMEDIATE MEDICAL CARE? Seek immediate medical care if:  Your contractions become stronger, more regular, and closer together.   You have fluid leaking or gushing from your vagina.   You have a fever.   You pass blood-tinged mucus.   You have vaginal bleeding.   You have continuous abdominal pain.   You have low back pain that you never had before.   You feel your baby's head pushing down and causing pelvic pressure.   Your baby is not moving as much as it used to.    This information is not intended to replace advice given to you by your health care provider. Make sure you discuss any questions you have with your health care provider.   Document Released: 11/27/2005 Document Revised: 12/02/2013 Document Reviewed: 09/08/2013 Elsevier Interactive Patient Education [employer]2016 Elsevier Inc.

## 2016-07-27 ENCOUNTER — Telehealth: Payer: Self-pay | Admitting: *Deleted

## 2016-07-27 LAB — GC/CHLAMYDIA PROBE AMP
Chlamydia trachomatis, NAA: NEGATIVE
NEISSERIA GONORRHOEAE BY PCR: NEGATIVE

## 2016-07-27 LAB — STREP GP B NAA: Strep Gp B NAA: NEGATIVE

## 2016-07-27 NOTE — Telephone Encounter (Signed)
Pt states lost mucus plug last night and this morning, has been having contractions but they are not regular.  Reports good FM and no gush of fluids.  Advised pt push fluids, and rest when can and keep appointment here next week.  If has sudden gush of fluids, contractions that are 5 - 7 min apart or decreased FM [facility].  Pt verbalized understanding.

## 2016-07-31 ENCOUNTER — Encounter (HOSPITAL_COMMUNITY): Payer: Self-pay | Admitting: *Deleted

## 2016-07-31 ENCOUNTER — Inpatient Hospital Stay (HOSPITAL_COMMUNITY)
Admission: AD | Admit: 2016-07-31 | Discharge: 2016-07-31 | Disposition: A | Discharge status: Home / Self Care | Payer: Medicare Other | Source: Ambulatory Visit | Attending: Obstetrics and Gynecology | Admitting: Obstetrics and Gynecology

## 2016-07-31 DIAGNOSIS — Z3A38 38 weeks gestation of pregnancy: Secondary | ICD-10-CM | POA: Diagnosis not present

## 2016-07-31 DIAGNOSIS — Z3493 Encounter for supervision of normal pregnancy, unspecified, third trimester: Secondary | ICD-10-CM

## 2016-07-31 DIAGNOSIS — O471 False labor at or after 37 completed weeks of gestation: Secondary | ICD-10-CM | POA: Diagnosis not present

## 2016-07-31 MED ORDER — OXYCODONE-ACETAMINOPHEN 5-325 MG PO TABS
2.0000 | ORAL_TABLET | Freq: Once | ORAL | Status: AC
  Administered 2016-07-31: 2 via ORAL
  Filled 2016-07-31: qty 2

## 2016-07-31 NOTE — Discharge Instructions (Signed)
Braxton Hicks Contractions °Contractions of the uterus can occur throughout pregnancy. Contractions are not always a sign that you are in labor.  °WHAT ARE BRAXTON HICKS CONTRACTIONS?  °Contractions that occur before labor are called Braxton Hicks contractions, or false labor. Toward the end of pregnancy (32-34 weeks), these contractions can develop more often and may become more forceful. This is not true labor because these contractions do not result in opening (dilatation) and thinning of the cervix. They are sometimes difficult to tell apart from true labor because these contractions can be forceful and people have different pain tolerances. You should not feel embarrassed if you go to the hospital with false labor. Sometimes, the only way to tell if you are in true labor is for your health care provider to look for changes in the cervix. °If there are no prenatal problems or other health problems associated with the pregnancy, it is completely safe to be sent home with false labor and await the onset of true labor. °HOW CAN YOU TELL THE DIFFERENCE BETWEEN TRUE AND FALSE LABOR? °False Labor °· The contractions of false labor are usually shorter and not as hard as those of true labor.   °· The contractions are usually irregular.   °· The contractions are often felt in the front of the lower abdomen and in the groin.   °· The contractions may go away when you walk around or change positions while lying down.   °· The contractions get weaker and are shorter lasting as time goes on.   °· The contractions do not usually become progressively stronger, regular, and closer together as with true labor.   °True Labor °· Contractions in true labor last 30-70 seconds, become very regular, usually become more intense, and increase in frequency.   °· The contractions do not go away with walking.   °· The discomfort is usually felt in the top of the uterus and spreads to the lower abdomen and low back.   °· True labor can be  determined by your health care provider with an exam. This will show that the cervix is dilating and getting thinner.   °WHAT TO REMEMBER °· Keep up with your usual exercises and follow other instructions given by your health care provider.   °· Take medicines as directed by your health care provider.   °· Keep your regular prenatal appointments.   °· Eat and drink lightly if you think you are going into labor.   °· If Braxton Hicks contractions are making you uncomfortable:   °¨ Change your position from lying down or resting to walking, or from walking to resting.   °¨ Sit and rest in a tub of warm water.   °¨ Drink 2-3 glasses of water. Dehydration may cause these contractions.   °¨ Do slow and deep breathing several times an hour.   °WHEN SHOULD I SEEK IMMEDIATE MEDICAL CARE? °Seek immediate medical care if: °· Your contractions become stronger, more regular, and closer together.   °· You have fluid leaking or gushing from your vagina.   °· You have a fever.   °· You pass blood-tinged mucus.   °· You have vaginal bleeding.   °· You have continuous abdominal pain.   °· You have low back pain that you never had before.   °· You feel your baby's head pushing down and causing pelvic pressure.   °· Your baby is not moving as much as it used to.   °  °This information is not intended to replace advice given to you by your health care provider. Make sure you discuss any questions you have with your health care   provider. °  °Document Released: 11/27/2005 Document Revised: 12/02/2013 Document Reviewed: 09/08/2013 °Elsevier Interactive Patient Education ©2016 Elsevier Inc. ° °

## 2016-07-31 NOTE — MAU Note (Signed)
Pt presents complaining of worsening contractions over the last 2 days. No leaking or bleeding. Reports good fetal movement.

## 2016-08-01 ENCOUNTER — Encounter: Payer: Self-pay | Admitting: Women's Health

## 2016-08-01 ENCOUNTER — Ambulatory Visit (INDEPENDENT_AMBULATORY_CARE_PROVIDER_SITE_OTHER): Payer: Medicare Other | Admitting: Women's Health

## 2016-08-01 VITALS — BP 112/78 | HR 88 | Wt 176.0 lb

## 2016-08-01 DIAGNOSIS — Z3493 Encounter for supervision of normal pregnancy, unspecified, third trimester: Secondary | ICD-10-CM

## 2016-08-01 DIAGNOSIS — Z331 Pregnant state, incidental: Secondary | ICD-10-CM

## 2016-08-01 DIAGNOSIS — Z1389 Encounter for screening for other disorder: Secondary | ICD-10-CM

## 2016-08-01 LAB — POCT URINALYSIS DIPSTICK
GLUCOSE UA: NEGATIVE
Ketones, UA: NEGATIVE
Leukocytes, UA: NEGATIVE
NITRITE UA: NEGATIVE
RBC UA: NEGATIVE

## 2016-08-01 NOTE — Patient Instructions (Signed)
Call the office ([phone]) or go to [facility] if:  You begin to have strong, frequent contractions  Your water breaks.  Sometimes it is a big gush of fluid, sometimes it is just a trickle that keeps getting your panties wet or running down your legs  You have vaginal bleeding.  It is normal to have a small amount of spotting if your cervix was checked.   You don't feel your baby moving like normal.  If you don't, get you something to eat and drink and lay down and focus on feeling your baby move.  You should feel at least 10 movements in 2 hours.  If you don't, you should call the office or go to [facility].    Braxton Hicks Contractions Contractions of the uterus can occur throughout pregnancy. Contractions are not always a sign that you are in labor.  WHAT ARE BRAXTON HICKS CONTRACTIONS?  Contractions that occur before labor are called Braxton Hicks contractions, or false labor. Toward the end of pregnancy (32-34 weeks), these contractions can develop more often and may become more forceful. This is not true labor because these contractions do not result in opening (dilatation) and thinning of the cervix. They are sometimes difficult to tell apart from true labor because these contractions can be forceful and people have different pain tolerances. You should not feel embarrassed if you go to the hospital with false labor. Sometimes, the only way to tell if you are in true labor is for your health care provider to look for changes in the cervix. If there are no prenatal problems or other health problems associated with the pregnancy, it is completely safe to be sent home with false labor and await the onset of true labor. HOW CAN YOU TELL THE DIFFERENCE BETWEEN TRUE AND FALSE LABOR? False Labor  The contractions of false labor are usually shorter and not as hard as those of true labor.   The contractions are usually irregular.   The contractions are often felt in the front of  the lower abdomen and in the groin.   The contractions may go away when you walk around or change positions while lying down.   The contractions get weaker and are shorter lasting as time goes on.   The contractions do not usually become progressively stronger, regular, and closer together as with true labor.  True Labor  Contractions in true labor last 30-70 seconds, become very regular, usually become more intense, and increase in frequency.   The contractions do not go away with walking.   The discomfort is usually felt in the top of the uterus and spreads to the lower abdomen and low back.   True labor can be determined by your health care provider with an exam. This will show that the cervix is dilating and getting thinner.  WHAT TO REMEMBER  Keep up with your usual exercises and follow other instructions given by your health care provider.   Take medicines as directed by your health care provider.   Keep your regular prenatal appointments.   Eat and drink lightly if you think you are going into labor.   If Braxton Hicks contractions are making you uncomfortable:   Change your position from lying down or resting to walking, or from walking to resting.   Sit and rest in a tub of warm water.   Drink 2-3 glasses of water. Dehydration may cause these contractions.   Do slow and deep breathing several times an hour.    WHEN SHOULD I SEEK IMMEDIATE MEDICAL CARE? Seek immediate medical care if:  Your contractions become stronger, more regular, and closer together.   You have fluid leaking or gushing from your vagina.   You have a fever.   You pass blood-tinged mucus.   You have vaginal bleeding.   You have continuous abdominal pain.   You have low back pain that you never had before.   You feel your baby's head pushing down and causing pelvic pressure.   Your baby is not moving as much as it used to.    This information is not intended to  replace advice given to you by your health care provider. Make sure you discuss any questions you have with your health care provider.   Document Released: 11/27/2005 Document Revised: 12/02/2013 Document Reviewed: 09/08/2013 Elsevier Interactive Patient Education 2016 Elsevier Inc.  

## 2016-08-01 NOTE — Progress Notes (Signed)
Low-risk OB appointment [id number] 3559w1d Estimated Date of Delivery: 08/14/16 BP 112/78   Pulse 88   Wt 176 lb (79.8 kg)   LMP 11/08/2015 (Exact Date)   BMI 30.21 kg/m   BP, weight, and urine reviewed.  Refer to obstetrical flow sheet for FH & FHR.  Reports good fm.  Denies lof, vb, or uti s/s. [place] for regular uc's, was loose 3cm. UC's have spaced out. Didn't go to beach.  SVE per request: 4/50/ballotable, tx Reviewed labor s/s, fkc. Plan:  Continue routine obstetrical care  F/U in 1wk for OB appointment

## 2016-08-08 ENCOUNTER — Ambulatory Visit (INDEPENDENT_AMBULATORY_CARE_PROVIDER_SITE_OTHER): Payer: Medicare Other | Admitting: Advanced Practice Midwife

## 2016-08-08 VITALS — BP 120/80 | HR 93 | Wt 178.0 lb

## 2016-08-08 DIAGNOSIS — Z3483 Encounter for supervision of other normal pregnancy, third trimester: Secondary | ICD-10-CM | POA: Diagnosis not present

## 2016-08-08 DIAGNOSIS — Z3A40 40 weeks gestation of pregnancy: Secondary | ICD-10-CM

## 2016-08-08 DIAGNOSIS — Z3493 Encounter for supervision of normal pregnancy, unspecified, third trimester: Secondary | ICD-10-CM

## 2016-08-08 DIAGNOSIS — Z331 Pregnant state, incidental: Secondary | ICD-10-CM

## 2016-08-08 DIAGNOSIS — Z1389 Encounter for screening for other disorder: Secondary | ICD-10-CM

## 2016-08-08 LAB — POCT URINALYSIS DIPSTICK
Glucose, UA: NEGATIVE
Ketones, UA: NEGATIVE
Leukocytes, UA: NEGATIVE
Nitrite, UA: NEGATIVE
PROTEIN UA: NEGATIVE
RBC UA: NEGATIVE

## 2016-08-08 NOTE — Patient Instructions (Signed)

## 2016-08-08 NOTE — Progress Notes (Signed)
[id number] 7051w1d Estimated Date of Delivery: 08/14/16  Blood pressure 120/80, pulse 93, weight 178 lb (80.7 kg), last menstrual period 11/08/2015.   BP weight and urine results all reviewed and noted.  Please refer to the obstetrical flow sheet for the fundal height and fetal heart rate documentation:  Patient reports good fetal movement, denies any bleeding and no rupture of membranes symptoms or regular contractions. Patient is without complaints other than normal pregnancy complaints. Has inner thigh cramps.  Doesn't want membranes stripped. Cx very posterior All questions were answered.  Orders Placed This Encounter  Procedures  . POCT urinalysis dipstick    Plan:  Continued routine obstetrical care,   Return in about 1 week (around 08/15/2016) for LROB.

## 2016-08-10 ENCOUNTER — Inpatient Hospital Stay (EMERGENCY_DEPARTMENT_HOSPITAL)
Admission: AD | Admit: 2016-08-10 | Discharge: 2016-08-11 | Disposition: A | Discharge status: Home / Self Care | Payer: Medicare Other | Source: Ambulatory Visit | Attending: Obstetrics and Gynecology | Admitting: Obstetrics and Gynecology

## 2016-08-10 ENCOUNTER — Encounter (HOSPITAL_COMMUNITY): Payer: Self-pay | Admitting: *Deleted

## 2016-08-10 DIAGNOSIS — O26893 Other specified pregnancy related conditions, third trimester: Secondary | ICD-10-CM

## 2016-08-10 DIAGNOSIS — O99513 Diseases of the respiratory system complicating pregnancy, third trimester: Secondary | ICD-10-CM | POA: Insufficient documentation

## 2016-08-10 DIAGNOSIS — Z789 Other specified health status: Secondary | ICD-10-CM

## 2016-08-10 DIAGNOSIS — Z3A39 39 weeks gestation of pregnancy: Secondary | ICD-10-CM | POA: Insufficient documentation

## 2016-08-10 DIAGNOSIS — O99333 Smoking (tobacco) complicating pregnancy, third trimester: Secondary | ICD-10-CM | POA: Insufficient documentation

## 2016-08-10 DIAGNOSIS — J45909 Unspecified asthma, uncomplicated: Secondary | ICD-10-CM | POA: Insufficient documentation

## 2016-08-10 DIAGNOSIS — F17210 Nicotine dependence, cigarettes, uncomplicated: Secondary | ICD-10-CM | POA: Insufficient documentation

## 2016-08-10 LAB — POCT FERN TEST: POCT FERN TEST: NEGATIVE

## 2016-08-10 NOTE — MAU Note (Signed)
Patient presents to mau with c/o leaking of clear fluid x 2 days. Endorses noting brown discharge. +FM. +contractions

## 2016-08-10 NOTE — MAU Note (Signed)
Patient endorses being 4cm at last check in the office

## 2016-08-11 DIAGNOSIS — Z3A39 39 weeks gestation of pregnancy: Secondary | ICD-10-CM

## 2016-08-11 DIAGNOSIS — O471 False labor at or after 37 completed weeks of gestation: Secondary | ICD-10-CM | POA: Diagnosis not present

## 2016-08-11 NOTE — MAU Provider Note (Signed)
First Provider Initiated Contact with Patient 08/10/16 2330      Chief Complaint:  Vaginal Bleeding and Rupture of Membranes   Mary [name] is  30 y.o. [id number] at [redacted]w[redacted]d presents complaining of Vaginal Bleeding and Rupture of Membranes She states that her underware has been wet off and on for the past 2 days.  Saw some light pink discharge today.  Denies gush of fluid, vaginal itch or irritation.  Feeling occasional ctx.  cx was 4 cms Tuesday.   Obstetrical/Gynecological History: OB History    Gravida Para Term Preterm AB Living   5 3 3   1 3    SAB TAB Ectopic Multiple Live Births   1       3     Past Medical History: Past Medical History:  Diagnosis Date  . Asthma   . Bipolar depression (HCC)   . Bronchitis 01/31/2016  . Depression   . Headache(784.0)   . Mental disorder   . Post traumatic stress disorder (PTSD)   . Pregnant 01/21/2016  . Sinus infection 01/31/2016  . Supervision of normal pregnancy, antepartum 02/08/2016    [facility] at             13+1 week FOB   Cathlene Gardella 30 yo WM Dating By  LMP and [place] Pap  02/08/16 GC/CT Initial:                36+wks: Genetic Screen NT/IT:  CF screen  Anatomic [place]  Flu vaccine  Tdap Recommended ~ 28wks Glucose Screen  2 hr GBS  Feed Preference  Contraception  Circumcision  Childbirth Classes  Pediatrician      Past Surgical History: Past Surgical History:  Procedure Laterality Date  . colposcopy    . nexplanon      Family History: Family History  Problem Relation Age of Onset  . Cancer Mother 67    breast  . Hypertension Mother   . Depression Mother   . Depression Father   . Seizures Father   . Cancer Maternal Grandmother     breast and cervical  . Asthma Son   . Asthma Son   . Hypertension Other   . Cancer Other     Social History: Social History  Substance Use Topics  . Smoking status: Current Some Day Smoker    Packs/day: 0.50    Types: Cigarettes  . Smokeless tobacco: Never Used   Comment: NEVER USE SNUFF OR CHEWING TOBACCO  . Alcohol use No    Allergies:  Allergies  Allergen Reactions  . Codeine Shortness Of Breath    Unknown Has taken vicodin and percocet without problem per pt  . Peanut-Containing Drug Products Shortness Of Breath  . Sulfa Antibiotics Anaphylaxis and Itching    Meds:  Prescriptions Prior to Admission  Medication Sig Dispense Refill Last Dose  . acetaminophen (TYLENOL) 325 MG tablet Take 650 mg by mouth every 6 (six) hours as needed. Reported on 05/01/2016   Taking  . albuterol (PROVENTIL HFA;VENTOLIN HFA) 108 (90 BASE) MCG/ACT inhaler Inhale 1-2 puffs into the lungs every 6 (six) hours as needed for wheezing or shortness of breath. 1 Inhaler 0 Taking  . albuterol (PROVENTIL) (2.5 MG/3ML) 0.083% nebulizer solution Take 3 mLs by nebulization every 6 (six) hours as needed for wheezing or shortness of breath. Reported on 06/20/2016  0 Taking  . calcium carbonate (TUMS - DOSED IN MG ELEMENTAL CALCIUM) 500 MG chewable tablet Chew 1 tablet by mouth  daily.   Taking  . ferrous sulfate 325 (65 FE) MG tablet Take 1 tablet (325 mg total) by mouth 2 (two) times daily with a meal. 60 tablet 3 Taking  . flintstones complete (FLINTSTONES) 60 MG chewable tablet Take 2 a day 60 tablet  Taking  . fluticasone (FLONASE) 50 MCG/ACT nasal spray Place into both nostrils daily.   Taking  . montelukast (SINGULAIR) 10 MG tablet Take 1 tablet (10 mg total) by mouth at bedtime. 30 tablet 1 Taking  . omeprazole (PRILOSEC) 20 MG capsule Take 1 capsule (20 mg total) by mouth daily. 30 capsule 1 Taking    Review of Systems   Constitutional: Negative for fever and chills Eyes: Negative for visual disturbances Respiratory: Negative for shortness of breath, dyspnea Cardiovascular: Negative for chest pain or palpitations  Gastrointestinal: Negative for vomiting, diarrhea and constipation Genitourinary: Negative for dysuria and urgency Musculoskeletal: Negative for back pain,  joint pain, myalgias.  Normal ROM  Neurological: Negative for dizziness and headaches    Physical Exam  Blood pressure 126/89, pulse 95, temperature 98.6 F (37 C), temperature source Oral, resp. rate 16, weight 81.2 kg (179 lb 1.3 oz), last menstrual period 11/08/2015, SpO2 100 %. GENERAL: Well-developed, well-nourished female in no acute distress.  LUNGS: Clear to auscultation bilaterally.  HEART: Regular rate and rhythm. ABDOMEN: Soft, nontender, nondistended, gravid.  EXTREMITIES: Nontender, no edema, 2+ distal pulses. DTR's 2+ SSE: Normal appearing discharge. No pooling, negative fern and valsalva. No blood CERVICAL EXAM: Dilatation 5-6cm   Effacement 50%   Station -3, posterior   Presentation: cephalic FHT:  Baseline rate 145 bpm   Variability moderate  Accelerations present   Decelerations none Contractions: q 8-12 minutes, 1/2 felt by pt   Labs: Results for orders placed or performed during the hospital encounter of 08/10/16 (from the past 24 hour(s))  Fern Test   Collection Time: 08/10/16 11:59 PM  Result Value Ref Range   POCT Fern Test Negative = intact amniotic membranes    Imaging Studies:  No results found.  Assessment: Mary [name] is  30 y.o. [phone] at 266w4d presents with no evidence of ROM or infection.  Advanced cervical dilation, no evidence of labor.  Plan: DC home with labor precautions  CRESENZO-DISHMAN,Kyi Romanello 9/1/201712:07 AM

## 2016-08-11 NOTE — Discharge Instructions (Signed)

## 2016-08-12 ENCOUNTER — Encounter (HOSPITAL_COMMUNITY): Payer: Self-pay | Admitting: *Deleted

## 2016-08-12 ENCOUNTER — Inpatient Hospital Stay (HOSPITAL_COMMUNITY)
Admission: AD | Admit: 2016-08-12 | Discharge: 2016-08-15 | DRG: 775 | Disposition: A | Discharge status: Home / Self Care | Payer: Medicare Other | Source: Ambulatory Visit | Attending: Obstetrics & Gynecology | Admitting: Obstetrics & Gynecology

## 2016-08-12 DIAGNOSIS — Z23 Encounter for immunization: Secondary | ICD-10-CM

## 2016-08-12 DIAGNOSIS — Z3A39 39 weeks gestation of pregnancy: Secondary | ICD-10-CM

## 2016-08-12 DIAGNOSIS — F17210 Nicotine dependence, cigarettes, uncomplicated: Secondary | ICD-10-CM | POA: Diagnosis present

## 2016-08-12 DIAGNOSIS — J45909 Unspecified asthma, uncomplicated: Secondary | ICD-10-CM | POA: Diagnosis present

## 2016-08-12 DIAGNOSIS — Z3493 Encounter for supervision of normal pregnancy, unspecified, third trimester: Secondary | ICD-10-CM

## 2016-08-12 DIAGNOSIS — O9952 Diseases of the respiratory system complicating childbirth: Secondary | ICD-10-CM | POA: Diagnosis present

## 2016-08-12 DIAGNOSIS — O99334 Smoking (tobacco) complicating childbirth: Secondary | ICD-10-CM | POA: Diagnosis present

## 2016-08-12 NOTE — MAU Note (Signed)
Pt presents to MAU with complaints of contractions. Denies any vaginal bleeding or LOF 

## 2016-08-13 ENCOUNTER — Encounter (HOSPITAL_COMMUNITY): Payer: Self-pay | Admitting: Emergency Medicine

## 2016-08-13 ENCOUNTER — Inpatient Hospital Stay (HOSPITAL_COMMUNITY): Payer: Medicare Other | Admitting: Anesthesiology

## 2016-08-13 DIAGNOSIS — O9952 Diseases of the respiratory system complicating childbirth: Secondary | ICD-10-CM | POA: Diagnosis present

## 2016-08-13 DIAGNOSIS — Z23 Encounter for immunization: Secondary | ICD-10-CM | POA: Diagnosis not present

## 2016-08-13 DIAGNOSIS — F17210 Nicotine dependence, cigarettes, uncomplicated: Secondary | ICD-10-CM | POA: Diagnosis not present

## 2016-08-13 DIAGNOSIS — Z3A39 39 weeks gestation of pregnancy: Secondary | ICD-10-CM | POA: Diagnosis not present

## 2016-08-13 DIAGNOSIS — J45909 Unspecified asthma, uncomplicated: Secondary | ICD-10-CM | POA: Diagnosis present

## 2016-08-13 DIAGNOSIS — O99334 Smoking (tobacco) complicating childbirth: Secondary | ICD-10-CM | POA: Diagnosis not present

## 2016-08-13 DIAGNOSIS — Z3483 Encounter for supervision of other normal pregnancy, third trimester: Secondary | ICD-10-CM | POA: Diagnosis not present

## 2016-08-13 LAB — CBC
HCT: 33 % — ABNORMAL LOW (ref 36.0–46.0)
Hemoglobin: 10.6 g/dL — ABNORMAL LOW (ref 12.0–15.0)
MCH: 28.6 pg (ref 26.0–34.0)
MCHC: 32.1 g/dL (ref 30.0–36.0)
MCV: 89.2 fL (ref 78.0–100.0)
PLATELETS: 243 10*3/uL (ref 150–400)
RBC: 3.7 MIL/uL — ABNORMAL LOW (ref 3.87–5.11)
RDW: 15.6 % — AB (ref 11.5–15.5)
WBC: 15.2 10*3/uL — AB (ref 4.0–10.5)

## 2016-08-13 LAB — TYPE AND SCREEN
ABO/RH(D): A POS
ANTIBODY SCREEN: NEGATIVE

## 2016-08-13 LAB — RPR: RPR: NONREACTIVE

## 2016-08-13 LAB — ABO/RH: ABO/RH(D): A POS

## 2016-08-13 MED ORDER — PRENATAL MULTIVITAMIN CH
1.0000 | ORAL_TABLET | Freq: Every day | ORAL | Status: DC
  Filled 2016-08-13 (×2): qty 1

## 2016-08-13 MED ORDER — DIBUCAINE 1 % RE OINT: 1.0000 "application " | TOPICAL_OINTMENT | RECTAL | Status: DC | PRN

## 2016-08-13 MED ORDER — OXYTOCIN 40 UNITS IN LACTATED RINGERS INFUSION - SIMPLE MED: 2.5000 [IU]/h | INTRAVENOUS | Status: DC

## 2016-08-13 MED ORDER — EPHEDRINE 5 MG/ML INJ
10.0000 mg | INTRAVENOUS | Status: DC | PRN
  Filled 2016-08-13: qty 4

## 2016-08-13 MED ORDER — BENZOCAINE-MENTHOL 20-0.5 % EX AERO
1.0000 "application " | INHALATION_SPRAY | CUTANEOUS | Status: DC | PRN
  Administered 2016-08-13: 1 via TOPICAL
  Filled 2016-08-13: qty 56

## 2016-08-13 MED ORDER — DIPHENHYDRAMINE HCL 50 MG/ML IJ SOLN: 12.5000 mg | INTRAMUSCULAR | Status: DC | PRN

## 2016-08-13 MED ORDER — SIMETHICONE 80 MG PO CHEW: 80.0000 mg | CHEWABLE_TABLET | ORAL | Status: DC | PRN

## 2016-08-13 MED ORDER — SENNOSIDES-DOCUSATE SODIUM 8.6-50 MG PO TABS
2.0000 | ORAL_TABLET | ORAL | Status: DC
  Administered 2016-08-13 – 2016-08-14 (×2): 2 via ORAL
  Filled 2016-08-13 (×2): qty 2

## 2016-08-13 MED ORDER — FENTANYL 2.5 MCG/ML BUPIVACAINE 1/10 % EPIDURAL INFUSION (WH - ANES)
INTRAMUSCULAR | Status: AC
  Filled 2016-08-13: qty 125

## 2016-08-13 MED ORDER — SOD CITRATE-CITRIC ACID 500-334 MG/5ML PO SOLN: 30.0000 mL | ORAL | Status: DC | PRN

## 2016-08-13 MED ORDER — ONDANSETRON HCL 4 MG/2ML IJ SOLN: 4.0000 mg | Freq: Four times a day (QID) | INTRAMUSCULAR | Status: DC | PRN

## 2016-08-13 MED ORDER — ZOLPIDEM TARTRATE 5 MG PO TABS: 5.0000 mg | ORAL_TABLET | Freq: Every evening | ORAL | Status: DC | PRN

## 2016-08-13 MED ORDER — COCONUT OIL OIL: 1.0000 "application " | TOPICAL_OIL | Status: DC | PRN

## 2016-08-13 MED ORDER — LIDOCAINE HCL (PF) 1 % IJ SOLN
INTRAMUSCULAR | Status: AC
Start: 2016-08-13 — End: 2016-08-15
  Administered 2016-08-13: 30 mL via SUBCUTANEOUS
  Filled 2016-08-13: qty 30

## 2016-08-13 MED ORDER — DIPHENHYDRAMINE HCL 25 MG PO CAPS: 25.0000 mg | ORAL_CAPSULE | Freq: Four times a day (QID) | ORAL | Status: DC | PRN

## 2016-08-13 MED ORDER — LACTATED RINGERS IV SOLN
INTRAVENOUS | Status: DC
  Administered 2016-08-13: 125 mL/h via INTRAVENOUS

## 2016-08-13 MED ORDER — LACTATED RINGERS IV SOLN: 500.0000 mL | INTRAVENOUS | Status: DC | PRN

## 2016-08-13 MED ORDER — TETANUS-DIPHTH-ACELL PERTUSSIS 5-2.5-18.5 LF-MCG/0.5 IM SUSP
0.5000 mL | Freq: Once | INTRAMUSCULAR | Status: AC
  Administered 2016-08-14: 0.5 mL via INTRAMUSCULAR
  Filled 2016-08-13: qty 0.5

## 2016-08-13 MED ORDER — OXYTOCIN BOLUS FROM INFUSION
500.0000 mL | Freq: Once | INTRAVENOUS | Status: AC
  Administered 2016-08-13: 500 mL via INTRAVENOUS

## 2016-08-13 MED ORDER — PHENYLEPHRINE 40 MCG/ML (10ML) SYRINGE FOR IV PUSH (FOR BLOOD PRESSURE SUPPORT)
80.0000 ug | PREFILLED_SYRINGE | INTRAVENOUS | Status: DC | PRN
  Filled 2016-08-13: qty 5

## 2016-08-13 MED ORDER — FENTANYL 2.5 MCG/ML BUPIVACAINE 1/10 % EPIDURAL INFUSION (WH - ANES): 14.0000 mL/h | INTRAMUSCULAR | Status: DC | PRN

## 2016-08-13 MED ORDER — OXYTOCIN 40 UNITS IN LACTATED RINGERS INFUSION - SIMPLE MED
INTRAVENOUS | Status: AC
  Administered 2016-08-13: 500 mL via INTRAVENOUS
  Filled 2016-08-13: qty 1000

## 2016-08-13 MED ORDER — LACTATED RINGERS IV SOLN
500.0000 mL | Freq: Once | INTRAVENOUS | Status: AC
  Administered 2016-08-13: 500 mL via INTRAVENOUS

## 2016-08-13 MED ORDER — ACETAMINOPHEN 325 MG PO TABS
650.0000 mg | ORAL_TABLET | ORAL | Status: DC | PRN
  Administered 2016-08-13: 650 mg via ORAL
  Filled 2016-08-13: qty 2

## 2016-08-13 MED ORDER — ACETAMINOPHEN 325 MG PO TABS: 650.0000 mg | ORAL_TABLET | ORAL | Status: DC | PRN

## 2016-08-13 MED ORDER — ONDANSETRON HCL 4 MG PO TABS: 4.0000 mg | ORAL_TABLET | ORAL | Status: DC | PRN

## 2016-08-13 MED ORDER — LIDOCAINE HCL (PF) 1 % IJ SOLN
30.0000 mL | INTRAMUSCULAR | Status: AC | PRN
  Administered 2016-08-13: 30 mL via SUBCUTANEOUS
  Filled 2016-08-13: qty 30

## 2016-08-13 MED ORDER — IBUPROFEN 600 MG PO TABS
600.0000 mg | ORAL_TABLET | Freq: Four times a day (QID) | ORAL | Status: DC
  Administered 2016-08-13 – 2016-08-15 (×9): 600 mg via ORAL
  Filled 2016-08-13 (×9): qty 1

## 2016-08-13 MED ORDER — WITCH HAZEL-GLYCERIN EX PADS: 1.0000 "application " | MEDICATED_PAD | CUTANEOUS | Status: DC | PRN

## 2016-08-13 MED ORDER — PHENYLEPHRINE 40 MCG/ML (10ML) SYRINGE FOR IV PUSH (FOR BLOOD PRESSURE SUPPORT)
PREFILLED_SYRINGE | INTRAVENOUS | Status: AC
  Filled 2016-08-13: qty 10

## 2016-08-13 MED ORDER — ONDANSETRON HCL 4 MG/2ML IJ SOLN: 4.0000 mg | INTRAMUSCULAR | Status: DC | PRN

## 2016-08-13 MED ORDER — OXYCODONE-ACETAMINOPHEN 5-325 MG PO TABS
1.0000 | ORAL_TABLET | ORAL | Status: DC | PRN
  Administered 2016-08-13 – 2016-08-15 (×4): 1 via ORAL
  Filled 2016-08-13 (×4): qty 1

## 2016-08-13 NOTE — H&P (Signed)
LABOR AND DELIVERY ADMISSION HISTORY AND PHYSICAL NOTE  Mary [name] is a 30 y.o. female [phone] with IUP at [redacted]w[redacted]d by 13 week U/S presenting for SOL.   She reports positive fetal movement. She denies leakage of fluid or vaginal bleeding. Had SROM in MAU to clear.  Prenatal History/Complications: none  Past Medical History: Past Medical History:  Diagnosis Date  . Asthma   . Bipolar depression (HCC)   . Bronchitis 01/31/2016  . Depression   . Headache(784.0)   . Mental disorder   . Post traumatic stress disorder (PTSD)   . Pregnant 01/21/2016  . Sinus infection 01/31/2016  . Supervision of normal pregnancy, antepartum 02/08/2016    [facility] at             13+1 week FOB   Merrit Waugh 30 yo WM Dating By  LMP and [place] Pap  02/08/16 GC/CT Initial:                36+wks: Genetic Screen NT/IT:  CF screen  Anatomic [place]  Flu vaccine  Tdap Recommended ~ 28wks Glucose Screen  2 hr GBS  Feed Preference  Contraception  Circumcision  Childbirth Classes  Pediatrician      Past Surgical History: Past Surgical History:  Procedure Laterality Date  . colposcopy    . nexplanon      Obstetrical History: OB History    Gravida Para Term Preterm AB Living   5 3 3   1 3    SAB TAB Ectopic Multiple Live Births   1       3      Social History: Social History   Social History  . Marital status: Married    Spouse name: N/A  . Number of children: N/A  . Years of education: N/A   Social History Main Topics  . Smoking status: Current Some Day Smoker    Packs/day: 0.50    Types: Cigarettes  . Smokeless tobacco: Never Used     Comment: NEVER USE SNUFF OR CHEWING TOBACCO  . Alcohol use No  . Drug use: No  . Sexual activity: Not Currently    Birth control/ protection: None   Other Topics Concern  . None   Social History Narrative  . None    Family History: Family History  Problem Relation Age of Onset  . Cancer Mother 4    breast  . Hypertension Mother   .  Depression Mother   . Depression Father   . Seizures Father   . Cancer Maternal Grandmother     breast and cervical  . Asthma Son   . Asthma Son   . Hypertension Other   . Cancer Other     Allergies: Allergies  Allergen Reactions  . Codeine Shortness Of Breath    Unknown Has taken vicodin and percocet without problem per pt  . Peanut-Containing Drug Products Shortness Of Breath  . Sulfa Antibiotics Anaphylaxis and Itching    Prescriptions Prior to Admission  Medication Sig Dispense Refill Last Dose  . acetaminophen (TYLENOL) 325 MG tablet Take 650 mg by mouth every 6 (six) hours as needed. Reported on 05/01/2016   Taking  . albuterol (PROVENTIL HFA;VENTOLIN HFA) 108 (90 BASE) MCG/ACT inhaler Inhale 1-2 puffs into the lungs every 6 (six) hours as needed for wheezing or shortness of breath. 1 Inhaler 0 Taking  . albuterol (PROVENTIL) (2.5 MG/3ML) 0.083% nebulizer solution Take 3 mLs by nebulization every 6 (six) hours as needed  for wheezing or shortness of breath. Reported on 06/20/2016  0 Taking  . calcium carbonate (TUMS - DOSED IN MG ELEMENTAL CALCIUM) 500 MG chewable tablet Chew 1 tablet by mouth daily.   Taking  . ferrous sulfate 325 (65 FE) MG tablet Take 1 tablet (325 mg total) by mouth 2 (two) times daily with a meal. 60 tablet 3 Taking  . flintstones complete (FLINTSTONES) 60 MG chewable tablet Take 2 a day 60 tablet  Taking  . fluticasone (FLONASE) 50 MCG/ACT nasal spray Place into both nostrils daily.   Taking  . montelukast (SINGULAIR) 10 MG tablet Take 1 tablet (10 mg total) by mouth at bedtime. 30 tablet 1 Taking  . omeprazole (PRILOSEC) 20 MG capsule Take 1 capsule (20 mg total) by mouth daily. 30 capsule 1 Taking     Review of Systems   All systems reviewed and negative except as stated in HPI  Blood pressure 93/62, pulse 95, temperature 98.8 F (37.1 C), temperature source Oral, resp. rate 20, height 5\' 4"  (1.626 m), weight 81.2 kg (179 lb), last menstrual period  11/08/2015. General appearance: alert, cooperative and no distress Lungs: no respiratory distress Heart: regular rate Abdomen: soft, non-tender Extremities: No calf swelling or tenderness Presentation: cephalic by cervical exam Fetal monitoring: 135 baseline, moderate variability, +accelerations Uterine activity: q2-684min Dilation: 5.5 Effacement (%): 80 Station: -3 Exam by:: Ginger Morris RN   Prenatal labs: ABO, Rh: --/--/A POS (09/03 0010) Antibody: NEG (09/03 0010) Rubella: nonimmune RPR: Non Reactive (06/07 0905)  HBsAg: Negative (02/28 1219)  HIV: Non Reactive (02/28 1219)  GBS: Negative (08/15 1400)  Genetic screening:  neg Anatomy US: normal  Prenatal Transfer Tool  Maternal Diabetes: No Genetic Screening: Normal Maternal Ultrasounds/Referrals: Normal Fetal Ultrasounds or other Referrals:  None Maternal Substance Abuse:  No Significant Maternal Medications:  Meds include: Other: singular Significant Maternal Lab Results: Lab values include: Group B Strep negative  Results for orders placed or performed during the hospital encounter of 08/12/16 (from the past 24 hour(s))  CBC   Collection Time: 08/13/16 12:10 AM  Result Value Ref Range   WBC 15.2 (H) 4.0 - 10.5 K/uL   RBC 3.70 (L) 3.87 - 5.11 MIL/uL   Hemoglobin 10.6 (L) 12.0 - 15.0 g/dL   HCT 40.933.0 (L) 81.136.0 - 91.446.0 %   MCV 89.2 78.0 - 100.0 fL   MCH 28.6 26.0 - 34.0 pg   MCHC 32.1 30.0 - 36.0 g/dL   RDW 78.215.6 (H) 95.611.5 - 21.315.5 %   Platelets 243 150 - 400 K/uL  Type and screen [facility]   Collection Time: 08/13/16 12:10 AM  Result Value Ref Range   ABO/RH(D) A POS    Antibody Screen NEG    Sample Expiration 08/16/2016     Patient Active Problem List   Diagnosis Date Noted  . Labor and delivery indication for care or intervention 08/13/2016  . Rubella non-immune status, antepartum 07/04/2016  . Susceptible to varicella (non-immune), currently pregnant 07/04/2016  . Supervision of  normal pregnancy 02/08/2016  . Sinus infection 01/31/2016  . Bronchitis 01/31/2016  . Abnormal Pap smear of cervix 07/22/2014  . HPV in female 07/22/2014  . Mild dysplasia of cervix 07/22/2014  . Bipolar disorder (HCC) 06/04/2013  . Asthma 06/04/2013  . Depression 06/04/2013    Assessment: Mary [name] is a 30 y.o. [id number] at 5126w6d here for SOL  #Labor:expectant management #Pain: Planning on epidural #FWB: Category I #ID:  GBS neg #MOF: bottle #MOC:  interval BTL #Circ:  outpatient  Leland Her, DO PGY-1 9/3/20172:09 AM   OB FELLOW HISTORY AND PHYSICAL ATTESTATION  I have seen and examined this patient; I agree with above documentation in the resident's note.    Jen Mow, DO OB Fellow 08/13/2016, 5:00 AM

## 2016-08-13 NOTE — Anesthesia Preprocedure Evaluation (Signed)
Anesthesia Evaluation  Patient identified by MRN, date of birth, ID band Patient awake    Reviewed: Allergy & Precautions, NPO status , Patient's Chart, lab work & pertinent test results  Airway Mallampati: II  TM Distance: >3 FB Neck ROM: Full    Dental no notable dental hx.    Pulmonary asthma , Current Smoker,    Pulmonary exam normal breath sounds clear to auscultation       Cardiovascular negative cardio ROS Normal cardiovascular exam Rhythm:Regular Rate:Normal     Neuro/Psych  Headaches, PSYCHIATRIC DISORDERS Depression Bipolar Disorder    GI/Hepatic negative GI ROS, Neg liver ROS,   Endo/Other  negative endocrine ROS  Renal/GU negative Renal ROS  negative genitourinary   Musculoskeletal negative musculoskeletal ROS (+)   Abdominal   Peds negative pediatric ROS (+)  Hematology negative hematology ROS (+)   Anesthesia Other Findings   Reproductive/Obstetrics negative OB ROS                             Anesthesia Physical Anesthesia Plan  ASA: II  Anesthesia Plan: Epidural   Post-op Pain Management:    Induction: Intravenous  Airway Management Planned: Natural Airway  Additional Equipment:   Intra-op Plan:   Post-operative Plan:   Informed Consent: I have reviewed the patients History and Physical, chart, labs and discussed the procedure including the risks, benefits and alternatives for the proposed anesthesia with the patient or authorized representative who has indicated his/her understanding and acceptance.   Dental advisory given  Plan Discussed with: CRNA  Anesthesia Plan Comments: (Informed consent obtained prior to proceeding including risk of failure, 1% risk of PDPH, risk of minor discomfort and bruising.  Discussed rare but serious complications including epidural abscess, permanent nerve injury, epidural hematoma.  Discussed alternatives to epidural  analgesia and patient desires to proceed.  Timeout performed pre-procedure verifying patient name, procedure, and platelet count.  Patient tolerated procedure well.)        Anesthesia Quick Evaluation

## 2016-08-13 NOTE — Progress Notes (Signed)
During placement of epidural, patient realized baby delivery was imminent. Epidural needle had not been placed into epidural space. Needle removed, and patient placed supine. RN checked and cervix complete.

## 2016-08-13 NOTE — Progress Notes (Signed)
VIS information on MMR vaccine given. Patient declines vaccine at this time.

## 2016-08-14 NOTE — Progress Notes (Signed)
[name] was referred for history of depression/anxiety. * Referral screened out by Clinical Social Worker because none of the following criteria appear to apply: ~ History of anxiety/depression during this pregnancy, or of post-partum depression. ~ Diagnosis of anxiety and/or depression within last 3 years OR * [name]'s symptoms currently being treated with medication and/or therapy.  CSW completed chart review and no indications of bipolar D/O since 2014. [name] is not currently taking any medications and no notes in prenatal hx regarding mental health concerns.  Please contact the Clinical Social Worker if needs arise, or if [name] requests.  Blaine HamperAngel [name]-Gilyard, MSW, LCSW Clinical Social Work [phone]

## 2016-08-14 NOTE — Progress Notes (Signed)
Post Partum Day 1 Subjective: no complaints, up ad lib, voiding, tolerating PO and + flatus  Objective: Blood pressure 100/68, pulse 84, temperature 97.6 F (36.4 C), temperature source Oral, resp. rate 18, height 5\' 4"  (1.626 m), weight 81.2 kg (179 lb), last menstrual period 11/08/2015, SpO2 96 %, unknown if currently breastfeeding.  Physical Exam:  General: alert, cooperative and no distress Lochia: appropriate Uterine Fundus: firm DVT Evaluation: No evidence of DVT seen on physical exam. Negative Homan's sign.   Recent Labs  08/13/16 0010  HGB 10.6*  HCT 33.0*    Assessment/Plan: Plan for discharge tomorrow   LOS: 1 day   Mary [name] PGY-1 08/14/2016, 6:49 AM   CNM attestation Post Partum Day #1 I have seen and examined this patient and agree with above documentation in the resident's note.   Mary [name] is a 30 y.o. [id number] s/p SVD.  Pt denies problems with ambulating, voiding or po intake. Pain is well controlled.  Plan for birth control is bilateral tubal ligation.  Method of Feeding: bottle  PE:  BP 100/68   Pulse 84   Temp 97.6 F (36.4 C) (Oral)   Resp 18   Ht 5\' 4"  (1.626 m)   Wt 81.2 kg (179 lb)   LMP 11/08/2015 (Exact Date)   SpO2 96%   Breastfeeding? Unknown   BMI 30.73 kg/m  Fundus firm  Plan for discharge: 08/15/16  Cam HaiSHAW, KIMBERLY, CNM 10:55 AM  08/14/2016

## 2016-08-15 ENCOUNTER — Encounter: Payer: Medicare Other | Admitting: Obstetrics & Gynecology

## 2016-08-15 MED ORDER — IBUPROFEN 600 MG PO TABS: 600.0000 mg | ORAL_TABLET | Freq: Four times a day (QID) | ORAL | 0 refills | Status: DC

## 2016-08-15 MED ORDER — MEASLES, MUMPS & RUBELLA VAC ~~LOC~~ INJ
0.5000 mL | INJECTION | Freq: Once | SUBCUTANEOUS | Status: AC
Start: 2016-08-15 — End: 2016-08-15
  Administered 2016-08-15: 0.5 mL via SUBCUTANEOUS
  Filled 2016-08-15 (×2): qty 0.5

## 2016-08-15 NOTE — Discharge Summary (Signed)
OB Discharge Summary     Patient Name: Mary [name] DOB: [birth year] MRN: [id number]  Date of admission: 08/12/2016 Delivering MD: Jen Mow [facility]   Date of discharge: 08/15/2016  Admitting diagnosis: 39.6 weeks and in labor Intrauterine pregnancy: [redacted]w[redacted]d     Secondary diagnosis:  Active Problems:   Labor and delivery indication for care or intervention  Additional problems: none     Discharge diagnosis: Term Pregnancy Delivered                                                                                                Post partum procedures:none  Augmentation: none  Complications: None  Hospital course:  Onset of Labor With Vaginal Delivery     30 y.o. yo [id number] at [redacted]w[redacted]d was admitted in Active Labor on 08/12/2016. Patient had an uncomplicated labor course as follows:  Membrane Rupture Time/Date: 12:00 AM ,08/13/2016   Intrapartum Procedures: Episiotomy: None [1]                                         Lacerations:  1st degree [2];Perineal [11];Periurethral [8]  Patient had a delivery of a Viable infant. 08/13/2016  Information for the patient's newborn:  Jerricka, Carvey [[id number]]  Delivery Method: Vaginal, Spontaneous Delivery (Filed from Delivery Summary)    Pateint had an uncomplicated postpartum course.  She is ambulating, tolerating a regular diet, passing flatus, and urinating well. Patient is discharged home in stable condition on 08/15/16.    Physical exam Vitals:   08/13/16 1900 08/14/16 0604 08/14/16 1825 08/15/16 0500  BP: (!) 101/58 100/68 113/75 104/71  Pulse: 88 84 81 70  Resp:  18 18 18   Temp:  97.6 F (36.4 C) 97.9 F (36.6 C) 97.7 F (36.5 C)  TempSrc:  Oral Oral Oral  SpO2: 96%     Weight:      Height:       General: alert Lochia: appropriate Uterine Fundus: firm Incision: N/A DVT Evaluation: No evidence of DVT seen on physical exam. Negative Homan's sign. No cords or calf tenderness. Labs: Lab Results  Component Value Date    WBC 15.2 (H) 08/13/2016   HGB 10.6 (L) 08/13/2016   HCT 33.0 (L) 08/13/2016   MCV 89.2 08/13/2016   PLT 243 08/13/2016   CMP Latest Ref Rng & Units 07/13/2016  Glucose 65 - 99 mg/dL 92  BUN 6 - 20 mg/dL 6  Creatinine 6.57 - 8.46 mg/dL 9.62  Sodium 952 - 841 mmol/L 133(L)  Potassium 3.5 - 5.1 mmol/L 3.6  Chloride 101 - 111 mmol/L 104  CO2 22 - 32 mmol/L 20(L)  Calcium 8.9 - 10.3 mg/dL 3.2(G)  Total Protein 6.5 - 8.1 g/dL 6.8  Total Bilirubin 0.3 - 1.2 mg/dL 0.4  Alkaline Phos 38 - 126 U/L 129(H)  AST 15 - 41 U/L 19  ALT 14 - 54 U/L 11(L)    Discharge instruction: per After Visit Summary and "Baby and Me Booklet".  After visit meds:  Medication List    TAKE these medications   acetaminophen 325 MG tablet Commonly known as:  TYLENOL Take 650 mg by mouth every 6 (six) hours as needed. Reported on 05/01/2016   albuterol (2.5 MG/3ML) 0.083% nebulizer solution Commonly known as:  PROVENTIL Take 3 mLs by nebulization every 6 (six) hours as needed for wheezing or shortness of breath. Reported on 06/20/2016   albuterol 108 (90 Base) MCG/ACT inhaler Commonly known as:  PROVENTIL HFA;VENTOLIN HFA Inhale 1-2 puffs into the lungs every 6 (six) hours as needed for wheezing or shortness of breath.   calcium carbonate 500 MG chewable tablet Commonly known as:  TUMS - dosed in mg elemental calcium Chew 1 tablet by mouth daily.   flintstones complete 60 MG chewable tablet Take 2 a day   fluticasone 50 MCG/ACT nasal spray Commonly known as:  FLONASE Place into both nostrils daily.   ibuprofen 600 MG tablet Commonly known as:  ADVIL,MOTRIN Take 1 tablet (600 mg total) by mouth every 6 (six) hours.   montelukast 10 MG tablet Commonly known as:  SINGULAIR Take 1 tablet (10 mg total) by mouth at bedtime.       Diet: routine diet  Activity: Advance as tolerated. Pelvic rest for 6 weeks.   Outpatient follow up:6 weeks Follow up Appt: Future Appointments Date Time Provider  Department Center  08/15/2016 12:00 PM Lazaro ArmsLuther H Eure, MD [facility]   Follow up Visit:No Follow-up on file.  Postpartum contraception: Tubal Ligation, interval  Newborn Data: Live born female  Birth Weight: 8 lb 12.2 oz (3975 g) APGAR: 8, 9  Baby Feeding: Bottle Disposition:home with mother   08/15/2016 Frederik PearJulie P Degele, MD  OB FELLOW DISCHARGE ATTESTATION  I have seen and examined this patient and agree with above documentation in the resident's note.   Ernestina PennaNicholas Hallie Ertl, MD 1:24 PM

## 2016-08-15 NOTE — Discharge Instructions (Signed)

## 2016-08-16 ENCOUNTER — Telehealth: Payer: Self-pay | Admitting: Obstetrics & Gynecology

## 2016-08-16 NOTE — Telephone Encounter (Signed)
Pt c/o bad swelling in hands, feet and ankles, really bad cramping in stomach and back. Pt had a SVD 08/13/2016. Pt given an appt tomorrow at 0830 for evaluation.

## 2016-08-17 ENCOUNTER — Ambulatory Visit: Payer: Medicare Other | Admitting: Obstetrics and Gynecology

## 2016-08-18 ENCOUNTER — Encounter: Payer: Self-pay | Admitting: Obstetrics & Gynecology

## 2016-08-18 ENCOUNTER — Ambulatory Visit (INDEPENDENT_AMBULATORY_CARE_PROVIDER_SITE_OTHER): Payer: Medicare Other | Admitting: Obstetrics & Gynecology

## 2016-08-18 VITALS — BP 110/80 | HR 80 | Wt 164.0 lb

## 2016-08-18 DIAGNOSIS — R5383 Other fatigue: Secondary | ICD-10-CM | POA: Diagnosis not present

## 2016-08-18 DIAGNOSIS — D509 Iron deficiency anemia, unspecified: Secondary | ICD-10-CM | POA: Diagnosis not present

## 2016-08-18 DIAGNOSIS — G44209 Tension-type headache, unspecified, not intractable: Secondary | ICD-10-CM

## 2016-08-18 DIAGNOSIS — O9081 Anemia of the puerperium: Secondary | ICD-10-CM

## 2016-08-18 LAB — POCT HEMOGLOBIN: HEMOGLOBIN: 9.9 g/dL — AB (ref 12.2–16.2)

## 2016-08-18 MED ORDER — BUTALBITAL-APAP-CAFFEINE 50-325-40 MG PO TABS: 1.0000 | ORAL_TABLET | Freq: Four times a day (QID) | ORAL | 0 refills | Status: DC | PRN

## 2016-08-18 NOTE — Progress Notes (Signed)
      Chief Complaint  Patient presents with  . work-in-gyn visit    feet has been swelling,but not today/ headches/ fatigue    Blood pressure 110/80, pulse 80, weight 164 lb (74.4 kg), unknown if currently breastfeeding.  29 y.o. [id number] No LMP recorded. The current method of family planning is .  Subjective Pt with bifrontal/bioccipital headaches  Objective Triggers in neck, relieved with acupressure Triggers thru trapezius bilaterally  Pertinent ROS   Labs or studies     Impression Diagnoses this Encounter::   ICD-9-CM ICD-10-CM   1. Muscle tension headache 307.81 G44.209   2. Other fatigue 780.79 R53.83 POCT hemoglobin    Established relevant diagnosis(es): Post partum 5 days  Plan/Recommendations: Meds ordered this encounter  Medications  . butalbital-acetaminophen-caffeine (FIORICET) 50-325-40 MG tablet    Sig: Take 1-2 tablets by mouth every 6 (six) hours as needed for headache.    Dispense:  20 tablet    Refill:  0    Labs or Scans Ordered: Orders Placed This Encounter  Procedures  . POCT hemoglobin    Management:: Local care for headahces  Follow up Return in about 4 weeks (around 09/15/2016) for post partum visit.        Face to face time:  15 minutes  Greater than 50% of the visit time was spent in counseling and coordination of care with the patient.  The summary and outline of the counseling and care coordination is summarized in the note above.   All questions were answered.

## 2016-09-19 ENCOUNTER — Telehealth: Payer: Self-pay | Admitting: Obstetrics & Gynecology

## 2016-09-19 NOTE — Telephone Encounter (Signed)
Pt states Dr.Eure performed circumcision and she feels the skins is connecting back. Offered an appt today for evaluation but states cannot come today, appt scheduled with Dr.Ferguson for tomorrow.

## 2016-09-27 ENCOUNTER — Ambulatory Visit: Payer: Medicare Other | Admitting: Advanced Practice Midwife

## 2016-10-04 ENCOUNTER — Ambulatory Visit: Payer: Medicare Other | Admitting: Advanced Practice Midwife

## 2016-10-10 ENCOUNTER — Ambulatory Visit (INDEPENDENT_AMBULATORY_CARE_PROVIDER_SITE_OTHER): Payer: Medicare Other | Admitting: Women's Health

## 2016-10-10 ENCOUNTER — Encounter (INDEPENDENT_AMBULATORY_CARE_PROVIDER_SITE_OTHER): Payer: Self-pay

## 2016-10-10 ENCOUNTER — Encounter: Payer: Self-pay | Admitting: Women's Health

## 2016-10-10 DIAGNOSIS — F53 Postpartum depression: Secondary | ICD-10-CM | POA: Insufficient documentation

## 2016-10-10 DIAGNOSIS — O99345 Other mental disorders complicating the puerperium: Secondary | ICD-10-CM | POA: Insufficient documentation

## 2016-10-10 MED ORDER — VENLAFAXINE HCL ER 37.5 MG PO CP24: 37.5000 mg | ORAL_CAPSULE | Freq: Every day | ORAL | 3 refills | Status: DC

## 2016-10-10 NOTE — Patient Instructions (Signed)
NO SEX UNTIL AFTER YOU GET YOUR BIRTH CONTROL   Levonorgestrel intrauterine device (IUD) What is this medicine? LEVONORGESTREL IUD (LEE voe nor jes trel) is a contraceptive (birth control) device. The device is placed inside the uterus by a healthcare professional. It is used to prevent pregnancy and can also be used to treat heavy bleeding that occurs during your period. Depending on the device, it can be used for 3 to 5 years. This medicine may be used for other purposes; ask your health care provider or pharmacist if you have questions. What should I tell my health care provider before I take this medicine? They need to know if you have any of these conditions: -abnormal Pap smear -cancer of the breast, uterus, or cervix -diabetes -endometritis -genital or pelvic infection now or in the past -have more than one sexual partner or your partner has more than one partner -heart disease -history of an ectopic or tubal pregnancy -immune system problems -IUD in place -liver disease or tumor -problems with blood clots or take blood-thinners -use intravenous drugs -uterus of unusual shape -vaginal bleeding that has not been explained -an unusual or allergic reaction to levonorgestrel, other hormones, silicone, or polyethylene, medicines, foods, dyes, or preservatives -pregnant or trying to get pregnant -breast-feeding How should I use this medicine? This device is placed inside the uterus by a health care professional. Talk to your pediatrician regarding the use of this medicine in children. Special care may be needed. Overdosage: If you think you have taken too much of this medicine contact a poison control center or emergency room at once. NOTE: This medicine is only for you. Do not share this medicine with others. What if I miss a dose? This does not apply. What may interact with this medicine? Do not take this medicine with any of the following  medications: -amprenavir -bosentan -fosamprenavir This medicine may also interact with the following medications: -aprepitant -barbiturate medicines for inducing sleep or treating seizures -bexarotene -griseofulvin -medicines to treat seizures like carbamazepine, ethotoin, felbamate, oxcarbazepine, phenytoin, topiramate -modafinil -pioglitazone -rifabutin -rifampin -rifapentine -some medicines to treat HIV infection like atazanavir, indinavir, lopinavir, nelfinavir, tipranavir, ritonavir -St. John's wort -warfarin This list may not describe all possible interactions. Give your health care provider a list of all the medicines, herbs, non-prescription drugs, or dietary supplements you use. Also tell them if you smoke, drink alcohol, or use illegal drugs. Some items may interact with your medicine. What should I watch for while using this medicine? Visit your doctor or health care professional for regular check ups. See your doctor if you or your partner has sexual contact with others, becomes HIV positive, or gets a sexual transmitted disease. This product does not protect you against HIV infection (AIDS) or other sexually transmitted diseases. You can check the placement of the IUD yourself by reaching up to the top of your vagina with clean fingers to feel the threads. Do not pull on the threads. It is a good habit to check placement after each menstrual period. Call your doctor right away if you feel more of the IUD than just the threads or if you cannot feel the threads at all. The IUD may come out by itself. You may become pregnant if the device comes out. If you notice that the IUD has come out use a backup birth control method like condoms and call your health care provider. Using tampons will not change the position of the IUD and are okay to use during your   period. What side effects may I notice from receiving this medicine? Side effects that you should report to your doctor or  health care professional as soon as possible: -allergic reactions like skin rash, itching or hives, swelling of the face, lips, or tongue -fever, flu-like symptoms -genital sores -high blood pressure -no menstrual period for 6 weeks during use -pain, swelling, warmth in the leg -pelvic pain or tenderness -severe or sudden headache -signs of pregnancy -stomach cramping -sudden shortness of breath -trouble with balance, talking, or walking -unusual vaginal bleeding, discharge -yellowing of the eyes or skin Side effects that usually do not require medical attention (report to your doctor or health care professional if they continue or are bothersome): -acne -breast pain -change in sex drive or performance -changes in weight -cramping, dizziness, or faintness while the device is being inserted -headache -irregular menstrual bleeding within first 3 to 6 months of use -nausea This list may not describe all possible side effects. Call your doctor for medical advice about side effects. You may report side effects to FDA at 1-800-FDA-1088. Where should I keep my medicine? This does not apply. NOTE: This sheet is a summary. It may not cover all possible information. If you have questions about this medicine, talk to your doctor, pharmacist, or health care provider.    2016, Elsevier/Gold Standard. (2011-12-28 13:54:04)  Postpartum Depression and Baby Blues The postpartum period begins right after the birth of a baby. During this time, there is often a great amount of joy and excitement. It is also a time of many changes in the life of the parents. Regardless of how many times a mother gives birth, each child brings new challenges and dynamics to the family. It is not unusual to have feelings of excitement along with confusing shifts in moods, emotions, and thoughts. All mothers are at risk of developing postpartum depression or the "baby blues." These mood changes can occur right after giving  birth, or they may occur many months after giving birth. The baby blues or postpartum depression can be mild or severe. Additionally, postpartum depression can go away rather quickly, or it can be a long-term condition.  CAUSES Raised hormone levels and the rapid drop in those levels are thought to be a main cause of postpartum depression and the baby blues. A number of hormones change during and after pregnancy. Estrogen and progesterone usually decrease right after the delivery of your baby. The levels of thyroid hormone and various cortisol steroids also rapidly drop. Other factors that play a role in these mood changes include major life events and genetics.  RISK FACTORS If you have any of the following risks for the baby blues or postpartum depression, know what symptoms to watch out for during the postpartum period. Risk factors that may increase the likelihood of getting the baby blues or postpartum depression include:  Having a personal or family history of depression.   Having depression while being pregnant.   Having premenstrual mood issues or mood issues related to oral contraceptives.  Having a lot of life stress.   Having marital conflict.   Lacking a social support network.   Having a baby with special needs.   Having health problems, such as diabetes.  SIGNS AND SYMPTOMS Symptoms of baby blues include:  Brief changes in mood, such as going from extreme happiness to sadness.  Decreased concentration.   Difficulty sleeping.   Crying spells, tearfulness.   Irritability.   Anxiety.  Symptoms of postpartum depression typically  begin within the first month after giving birth. These symptoms include:  Difficulty sleeping or excessive sleepiness.   Marked weight loss.   Agitation.   Feelings of worthlessness.   Lack of interest in activity or food.  Postpartum psychosis is a very serious condition and can be dangerous. Fortunately, it is rare.  Displaying any of the following symptoms is cause for immediate medical attention. Symptoms of postpartum psychosis include:   Hallucinations and delusions.   Bizarre or disorganized behavior.   Confusion or disorientation.  DIAGNOSIS  A diagnosis is made by an evaluation of your symptoms. There are no medical or lab tests that lead to a diagnosis, but there are various questionnaires that a health care provider may use to identify those with the baby blues, postpartum depression, or psychosis. Often, a screening tool called the [place] Postnatal Depression Scale is used to diagnose depression in the postpartum period.  TREATMENT The baby blues usually goes away on its own in 1-2 weeks. Social support is often all that is needed. You will be encouraged to get adequate sleep and rest. Occasionally, you may be given medicines to help you sleep.  Postpartum depression requires treatment because it can last several months or longer if it is not treated. Treatment may include individual or group therapy, medicine, or both to address any social, physiological, and psychological factors that may play a role in the depression. Regular exercise, a healthy diet, rest, and social support may also be strongly recommended.  Postpartum psychosis is more serious and needs treatment right away. Hospitalization is often needed. HOME CARE INSTRUCTIONS  Get as much rest as you can. Nap when the baby sleeps.   Exercise regularly. Some women find yoga and walking to be beneficial.   Eat a balanced and nourishing diet.   Do little things that you enjoy. Have a cup of tea, take a bubble bath, read your favorite magazine, or listen to your favorite music.  Avoid alcohol.   Ask for help with household chores, cooking, grocery shopping, or running errands as needed. Do not try to do everything.   Talk to people close to you about how you are feeling. Get support from your partner, family members, friends,  or other new moms.  Try to stay positive in how you think. Think about the things you are grateful for.   Do not spend a lot of time alone.   Only take over-the-counter or prescription medicine as directed by your health care provider.  Keep all your postpartum appointments.   Let your health care provider know if you have any concerns.  SEEK MEDICAL CARE IF: You are having a reaction to or problems with your medicine. SEEK IMMEDIATE MEDICAL CARE IF:  You have suicidal feelings.   You think you may harm the baby or someone else. MAKE SURE YOU:  Understand these instructions.  Will watch your condition.  Will get help right away if you are not doing well or get worse.   This information is not intended to replace advice given to you by your health care provider. Make sure you discuss any questions you have with your health care provider.   Document Released: 08/31/2004 Document Revised: 12/02/2013 Document Reviewed: 09/08/2013 Elsevier Interactive Patient Education [employer]2016 Elsevier Inc.

## 2016-10-10 NOTE — Progress Notes (Signed)
Subjective:    Mary [name] is a 30 y.o. [id number] Caucasian female who presents for a postpartum visit. She is 8 weeks postpartum following a spontaneous vaginal delivery at 39.6 gestational weeks. Anesthesia: none. I have fully reviewed the prenatal and intrapartum course. Postpartum course has been uncomplicated. Baby's course has been uncomplicated. Baby is feeding by bottle. Bleeding no bleeding. Bowel function is normal. Bladder function is normal. Patient is sexually active. Last sexual activity: 10/27. Contraception method is husband getting vasectomy, she wants IUD. Postpartum depression screening: positive. Score 26. H/O bipolar, PTSD, not on any meds. Denies SI/HI/II. Sees [facility] for counseling, but has missed few appts. Does feel like she needs to be on meds, states Effexor worked well for her in past.  Last pap 02/08/16 and was neg.  The following portions of the patient's history were reviewed and updated as appropriate: allergies, current medications, past medical history, past surgical history and problem list.  Review of Systems Pertinent items are noted in HPI.   Vitals:   10/10/16 1209  BP: 100/70  Pulse: 76  Weight: 160 lb (72.6 kg)   No LMP recorded.  Objective:   General:  alert, cooperative and no distress   Breasts:  deferred, no complaints  Lungs: clear to auscultation bilaterally  Heart:  regular rate and rhythm  Abdomen: soft, nontender   Vulva: normal  Vagina: normal vagina  Cervix:  closed  Corpus: Well-involuted  Adnexa:  Non-palpable  Rectal Exam: No hemorrhoids        Assessment:   Postpartum exam 8 wks s/p SVB Bottlefeeding PPD, h/o bipolar, PTSD Depression screening Contraception counseling   Plan:  Rx Effexor 37.5mg  daily Contraception: abstinence until IUD insertion  Gave options of IUD placement when next period starts, or 10d from last sex w/ bhcg am insertion pm, or 14d from last sex w/ UPT and insertion at same time, pt  prefers 14d  Follow up on 11/10 for IUD insertion, then 4wks from now for f/u on Effexor, or earlier if needed Reschedule appt w/ [facility]  Marge DuncansBooker, Buckley Bradly Randall CNM, [facility]NP-BC 10/10/2016 12:39 PM

## 2016-10-20 ENCOUNTER — Ambulatory Visit: Payer: Medicare Other | Admitting: Women's Health

## 2016-10-23 ENCOUNTER — Other Ambulatory Visit: Payer: Self-pay | Admitting: *Deleted

## 2016-10-23 MED ORDER — VENLAFAXINE HCL ER 37.5 MG PO CP24: 37.5000 mg | ORAL_CAPSULE | Freq: Every day | ORAL | 0 refills | Status: DC

## 2016-10-25 DIAGNOSIS — W228XXA Striking against or struck by other objects, initial encounter: Secondary | ICD-10-CM | POA: Diagnosis not present

## 2016-10-25 DIAGNOSIS — F17200 Nicotine dependence, unspecified, uncomplicated: Secondary | ICD-10-CM | POA: Diagnosis not present

## 2016-10-25 DIAGNOSIS — R51 Headache: Secondary | ICD-10-CM | POA: Diagnosis not present

## 2016-10-25 DIAGNOSIS — R42 Dizziness and giddiness: Secondary | ICD-10-CM | POA: Diagnosis not present

## 2016-10-25 DIAGNOSIS — S060X0A Concussion without loss of consciousness, initial encounter: Secondary | ICD-10-CM | POA: Diagnosis not present

## 2016-10-25 DIAGNOSIS — S0990XA Unspecified injury of head, initial encounter: Secondary | ICD-10-CM | POA: Diagnosis not present

## 2016-10-25 DIAGNOSIS — J45909 Unspecified asthma, uncomplicated: Secondary | ICD-10-CM | POA: Diagnosis not present

## 2016-10-26 DIAGNOSIS — R51 Headache: Secondary | ICD-10-CM | POA: Diagnosis not present

## 2016-10-26 DIAGNOSIS — S0990XA Unspecified injury of head, initial encounter: Secondary | ICD-10-CM | POA: Diagnosis not present

## 2016-10-26 DIAGNOSIS — R42 Dizziness and giddiness: Secondary | ICD-10-CM | POA: Diagnosis not present

## 2016-10-27 ENCOUNTER — Ambulatory Visit: Payer: Medicare Other | Admitting: Obstetrics and Gynecology

## 2016-11-01 ENCOUNTER — Encounter: Payer: Medicare Other | Admitting: Obstetrics and Gynecology

## 2016-11-06 ENCOUNTER — Other Ambulatory Visit (HOSPITAL_COMMUNITY): Payer: Self-pay

## 2016-11-07 ENCOUNTER — Ambulatory Visit: Payer: Medicare Other | Admitting: Women's Health

## 2016-11-16 ENCOUNTER — Ambulatory Visit: Payer: Medicare Other | Admitting: Women's Health

## 2016-11-20 ENCOUNTER — Encounter: Payer: Medicare Other | Admitting: Obstetrics and Gynecology

## 2016-11-20 DIAGNOSIS — Z029 Encounter for administrative examinations, unspecified: Secondary | ICD-10-CM

## 2016-11-21 ENCOUNTER — Ambulatory Visit: Payer: Medicare Other | Admitting: Women's Health

## 2016-11-23 ENCOUNTER — Encounter: Payer: Medicare Other | Admitting: Obstetrics and Gynecology

## 2016-11-23 DIAGNOSIS — F332 Major depressive disorder, recurrent severe without psychotic features: Secondary | ICD-10-CM | POA: Diagnosis not present

## 2016-11-30 ENCOUNTER — Telehealth: Payer: Self-pay | Admitting: Adult Health

## 2016-11-30 NOTE — Telephone Encounter (Signed)
Pt states that she would like a call back from [place] because she knows the situation. Please contact pt

## 2016-11-30 NOTE — Telephone Encounter (Signed)
Pt ambivalent re healing process. Pt doesn't have 2 days to recover. Pt reassured after further discussion

## 2016-11-30 NOTE — Telephone Encounter (Signed)
Spoke with pt. Pt is scheduled for a tubal next week but wants more info on [facility]. Dr. Emelda FearFerguson to call pt today. JSY

## 2016-12-05 NOTE — Patient Instructions (Signed)
Mary [name]  12/05/2016     @PREFPERIOPPHARMACY @   Your procedure is scheduled on  12/12/2016   Report to [facility] at  810 A.M.  Call this number if you have problems the morning of surgery:  [phone]   Remember:  Do not eat food or drink liquids after midnight.  Take these medicines the morning of surgery with A SIP OF WATER  Fioricet, effexor, singulair.   Do not wear jewelry, make-up or nail polish.  Do not wear lotions, powders, or perfumes, or deoderant.  Do not shave 48 hours prior to surgery.  Men may shave face and neck.  Do not bring valuables to the hospital.  [facility] is not responsible for any belongings or valuables.  Contacts, dentures or bridgework may not be worn into surgery.  Leave your suitcase in the car.  After surgery it may be brought to your room.  For patients admitted to the hospital, discharge time will be determined by your treatment team.  Patients discharged the day of surgery will not be allowed to drive home.   Name and phone number of your driver:   family Special instructions:  none  Please read over the following fact sheets that you were given. Anesthesia Post-op Instructions and Care and Recovery After Surgery      "Salpingectomy Salpingectomy, also called tubectomy, is the surgical removal of one of the fallopian tubes. The fallopian tubes are where eggs travel from the ovaries to the uterus. Removing one fallopian tube does not prevent you from becoming pregnant. It also does not cause problems with your menstrual periods. You may need a salpingectomy if you:  Have a fertilized egg that attaches to the fallopian tube (ectopic pregnancy), especially one that causes the tube to burst or tear (rupture).  Have an infected fallopian tube.  Have cancer of the fallopian tube or nearby organs.  Have had an ovary removed due to a cyst or tumor.  Have had your uterus removed. There are three different methods  that can be used for a salpingectomy:  Open. This method involves making one large incision in your abdomen.  Laparoscopic. This method involves using a thin, lighted tube with a tiny camera on the end (laparoscope) to help perform the procedure. The laparoscope will allow your surgeon to make several small incisions in the abdomen instead of a large incision.  Robot-assisted: This method involves using a computer to control surgical instruments that are attached to robotic arms. Tell a health care provider about:  Any allergies you have.  All medicines you are taking, including vitamins, herbs, eye drops, creams, and over-the-counter medicines.  Any problems you or family members have had with anesthetic medicines.  Any blood disorders you have.  Any surgeries you have had.  Any medical conditions you have.  Whether you are pregnant or may be pregnant. What are the risks? Generally, this is a safe procedure. However, problems may occur, including:  Infection.  Bleeding.  Allergic reactions to medicines.  Damage to other structures or organs.  Blood clots in the legs or lungs. What happens before the procedure? Staying hydrated  Follow instructions from your health care provider about hydration, which may include:  Up to 2 hours before the procedure - you may continue to drink clear liquids, such as water, clear fruit juice, black coffee, and plain tea. Eating and drinking restrictions  Follow instructions from your health care provider  about eating and drinking, which may include:  8 hours before the procedure - stop eating heavy meals or foods such as meat, fried foods, or fatty foods.  6 hours before the procedure - stop eating light meals or foods, such as toast or cereal.  6 hours before the procedure - stop drinking milk or drinks that contain milk.  2 hours before the procedure - stop drinking clear liquids. Medicines  Ask your health care provider  about:  Changing or stopping your regular medicines. This is especially important if you are taking diabetes medicines or blood thinners.  Taking medicines such as aspirin and ibuprofen. These medicines can thin your blood. Do not take these medicines before your procedure if your health care provider instructs you not to.  You may be given antibiotic medicine to help prevent infection. General instructions  Do not smoke for at least 2 weeks before your procedure. If you need help quitting, ask your health care provider.  You may have an exam or tests, such as an electrocardiogram (ECG).  You may have a blood or urine sample taken.  Ask your health care provider:  Whether you should stop removing hair from your surgical area.  How your surgical site will be marked or identified.  You may be asked to shower with a germ-killing soap.  Plan to have someone take you home from the hospital or clinic.  If you will be going home right after the procedure, plan to have someone with you for 24 hours. What happens during the procedure?  To reduce your risk of infection:  Your health care team will wash or sanitize their hands.  Hair may be removed from the surgical area.  Your skin will be washed with soap.  An IV tube will be inserted into one of your veins.  You will be given a medicine to make you fall asleep (general anesthetic). You may also be given a medicine to help you relax (sedative).  A thin tube (catheter) may be inserted through your urethra and into your bladder to drain urine during your procedure.  Depending on the type of procedure you are having, one incision or several small incisions will be made in your abdomen.  Your fallopian tube will be cut and removed from where it attaches to your uterus.  Your blood vessels will be clamped and tied to prevent excess bleeding.  The incision(s) in your abdomen will be closed with stitches (sutures), staples, or skin  glue.  A bandage (dressing) may be placed over your incision(s). The procedure may vary among health care providers and hospitals. What happens after the procedure?  Your blood pressure, heart rate, breathing rate, and blood oxygen level will be monitored until the medicines you were given have worn off.  You may continue to receive fluids and medicines through an IV tube.  You may continue to have a catheter draining your urine.  You may have to wear compression stockings. These stockings help to prevent blood clots and reduce swelling in your legs.  You will be given pain medicine as needed.  Do not drive for 24 hours if you received a sedative. Summary  Salpingectomy is a surgical procedure to remove one of the fallopian tubes.  The procedure may be done with an open incision, with a laparoscope, or with computer-controlled instruments.  Depending on the type of procedure you are having, one incision or several small incisions will be made in your abdomen.  Your blood pressure,  heart rate, breathing rate, and blood oxygen level will be monitored until the medicines you were given have worn off.  Plan to have someone take you home from the hospital or clinic. This information is not intended to replace advice given to you by your health care provider. Make sure you discuss any questions you have with your health care provider. Document Released: 04/15/2009 Document Revised: 07/14/2016 Document Reviewed: 05/21/2013 Elsevier Interactive Patient Education  2017 [employer]  Salpingectomy, Care After This sheet gives you information about how to care for yourself after your procedure. Your health care provider may also give you more specific instructions. If you have problems or questions, contact your health care provider. What can I expect after the procedure? After your procedure, it is common to have:  Pain in your abdomen.  Some occasional vaginal bleeding  (spotting).  Tiredness. Follow these instructions at home: Incision care  Keep your incision area and your bandage (dressing) clean and dry.  Follow instructions from your health care provider about how to take care of your incision. Make sure you:  Wash your hands with soap and water before you change your dressing. If soap and water are not available, use hand sanitizer.  Change your dressing astold by your health care provider.  Leave stitches (sutures), staples, skin glue, or adhesive strips in place. These skin closures may need to stay in place for 2 weeks or longer. If adhesive strip edges start to loosen and curl up, you may trim the loose edges. Do not remove adhesive strips completely unless your health care provider tells you to do that.  Check your incision area every day for signs of infection. Check for:  More redness, swelling, or pain.  More fluid or blood.  Warmth.  Pus or a bad smell. Activity  Do not drive or use heavy machinery while taking prescription pain medicine.  Do not drive for 24 hours if you received a medicine to help you relax (sedative).  Rest as directed by your health care provider. Ask your health care provider what activities are safe for you. You should avoid:  Lifting anything that is heavier than 10 lb (4.5 kg) until your health care provider approves.  Activities that require a lot of energy.  Until your health care provider approves:  Do not douche.  Do not use tampons.  Do not have sexual intercourse. General instructions  Take over-the-counter and prescription medicines only as told by your health care provider.  To prevent or treat constipation while you are taking prescription pain medicine, your health care provider may recommend that you:  Drink enough fluid to keep your urine clear or pale yellow.  Take over-the-counter or prescription medicines.  Eat foods that are high in fiber, such as fresh fruits and  vegetables, whole grains, and beans.  Limit foods that are high in fat and processed sugars, such as fried and sweet foods.  Do not take baths, swim, or use a hot tub until your health care provider approves. You may take showers.  Wear compression stockings as told by your health care provider. These stockings help to prevent blood clots and reduce swelling in your legs.  Keep all follow-up visits as told by your health care provider. This is important. Contact a health care provider if:  You have:  Pain when you urinate.  More redness, swelling, or pain around your incision.  More fluid or blood coming from your incision.  Pus or a bad smell coming  from your incision.  A fever.  Abdominal pain that gets worse or does not get better with medicine.  Your incision feels warm to the touch.  Your incision starts to break open.  You develop a rash.  You develop nausea and vomiting.  You feel light-headed. Get help right away if:  You develop pain in your chest or leg.  You develop shortness of breath.  You faint.  You have increased vaginal bleeding. This information is not intended to replace advice given to you by your health care provider. Make sure you discuss any questions you have with your health care provider. Document Released: 03/03/2011 Document Revised: 07/26/2016 Document Reviewed: 07/27/2016 Elsevier Interactive Patient Education  2017 [employer]  General Anesthesia, Adult General anesthesia is the use of medicines to make a person "go to sleep" (be unconscious) for a medical procedure. General anesthesia is often recommended when a procedure:  Is long.  Requires you to be still or in an unusual position.  Is major and can cause you to lose blood.  Is impossible to do without general anesthesia. The medicines used for general anesthesia are called general anesthetics. In addition to making you sleep, the medicines:  Prevent pain.  Control  your blood pressure.  Relax your muscles. Tell a health care provider about:  Any allergies you have.  All medicines you are taking, including vitamins, herbs, eye drops, creams, and over-the-counter medicines.  Any problems you or family members have had with anesthetic medicines.  Types of anesthetics you have had in the past.  Any bleeding disorders you have.  Any surgeries you have had.  Any medical conditions you have.  Any history of heart or lung conditions, such as heart failure, sleep apnea, or chronic obstructive pulmonary disease (COPD).  Whether you are pregnant or may be pregnant.  Whether you use tobacco, alcohol, marijuana, or street drugs.  Any history of [employer] plannermilitary service.  Any history of depression or anxiety. What are the risks? Generally, this is a safe procedure. However, problems may occur, including:  Allergic reaction to anesthetics.  Lung and heart problems.  Inhaling food or liquids from your stomach into your lungs (aspiration).  Injury to nerves.  Waking up during your procedure and being unable to move (rare).  Extreme agitation or a state of mental confusion (delirium) when you wake up from the anesthetic.  Air in the bloodstream, which can lead to stroke. These problems are more likely to develop if you are having a major surgery or if you have an advanced medical condition. You can prevent some of these complications by answering all of your health care provider's questions thoroughly and by following all pre-procedure instructions. General anesthesia can cause side effects, including:  Nausea or vomiting  A sore throat from the breathing tube.  Feeling cold or shivery.  Feeling tired, washed out, or achy.  Sleepiness or drowsiness.  Confusion or agitation. What happens before the procedure? Staying hydrated  Follow instructions from your health care provider about hydration, which may include:  Up to 2 hours before the  procedure - you may continue to drink clear liquids, such as water, clear fruit juice, black coffee, and plain tea. Eating and drinking restrictions  Follow instructions from your health care provider about eating and drinking, which may include:  8 hours before the procedure - stop eating heavy meals or foods such as meat, fried foods, or fatty foods.  6 hours before the procedure - stop eating light meals  or foods, such as toast or cereal.  6 hours before the procedure - stop drinking milk or drinks that contain milk.  2 hours before the procedure - stop drinking clear liquids. Medicines  Ask your health care provider about:  Changing or stopping your regular medicines. This is especially important if you are taking diabetes medicines or blood thinners.  Taking medicines such as aspirin and ibuprofen. These medicines can thin your blood. Do not take these medicines before your procedure if your health care provider instructs you not to.  Taking new dietary supplements or medicines. Do not take these during the week before your procedure unless your health care provider approves them.  If you are told to take a medicine or to continue taking a medicine on the day of the procedure, take the medicine with sips of water. General instructions   Ask if you will be going home the same day, the following day, or after a longer hospital stay.  Plan to have someone take you home.  Plan to have someone stay with you for the first 24 hours after you leave the hospital or clinic.  For 3-6 weeks before the procedure, try not to use any tobacco products, such as cigarettes, chewing tobacco, and e-cigarettes.  You may brush your teeth on the morning of the procedure, but make sure to spit out the toothpaste. What happens during the procedure?  You will be given anesthetics through a mask and through an IV tube in one of your veins.  You may receive medicine to help you relax (sedative).  As  soon as you are asleep, a breathing tube may be used to help you breathe.  An anesthesia specialist will stay with you throughout the procedure. He or she will help keep you comfortable and safe by continuing to give you medicines and adjusting the amount of medicine that you get. He or she will also watch your blood pressure, pulse, and oxygen levels to make sure that the anesthetics do not cause any problems.  If a breathing tube was used to help you breathe, it will be removed before you wake up. The procedure may vary among health care providers and hospitals. What happens after the procedure?  You will wake up, often slowly, after the procedure is complete, usually in a recovery area.  Your blood pressure, heart rate, breathing rate, and blood oxygen level will be monitored until the medicines you were given have worn off.  You may be given medicine to help you calm down if you feel anxious or agitated.  If you will be going home the same day, your health care provider may check to make sure you can stand, drink, and urinate.  Your health care providers will treat your pain and side effects before you go home.  Do not drive for 24 hours if you received a sedative.  You may:  Feel nauseous and vomit.  Have a sore throat.  Have mental slowness.  Feel cold or shivery.  Feel sleepy.  Feel tired.  Feel sore or achy, even in parts of your body where you did not have surgery. This information is not intended to replace advice given to you by your health care provider. Make sure you discuss any questions you have with your health care provider. Document Released: 03/05/2008 Document Revised: 05/09/2016 Document Reviewed: 11/11/2015 Elsevier Interactive Patient Education  2017 [employer] General Anesthesia, Adult, Care After These instructions provide you with information about caring for yourself after  your procedure. Your health care provider may also give you more specific  instructions. Your treatment has been planned according to current medical practices, but problems sometimes occur. Call your health care provider if you have any problems or questions after your procedure. What can I expect after the procedure? After the procedure, it is common to have:  Vomiting.  A sore throat.  Mental slowness. It is common to feel:  Nauseous.  Cold or shivery.  Sleepy.  Tired.  Sore or achy, even in parts of your body where you did not have surgery. Follow these instructions at home: For at least 24 hours after the procedure:  Do not:  Participate in activities where you could fall or become injured.  Drive.  Use heavy machinery.  Drink alcohol.  Take sleeping pills or medicines that cause drowsiness.  Make important decisions or sign legal documents.  Take care of children on your own.  Rest. Eating and drinking  If you vomit, drink water, juice, or soup when you can drink without vomiting.  Drink enough fluid to keep your urine clear or pale yellow.  Make sure you have little or no nausea before eating solid foods.  Follow the diet recommended by your health care provider. General instructions  Have a responsible adult stay with you until you are awake and alert.  Return to your normal activities as told by your health care provider. Ask your health care provider what activities are safe for you.  Take over-the-counter and prescription medicines only as told by your health care provider.  If you smoke, do not smoke without supervision.  Keep all follow-up visits as told by your health care provider. This is important. Contact a health care provider if:  You continue to have nausea or vomiting at home, and medicines are not helpful.  You cannot drink fluids or start eating again.  You cannot urinate after 8-12 hours.  You develop a skin rash.  You have fever.  You have increasing redness at the site of your procedure. Get  help right away if:  You have difficulty breathing.  You have chest pain.  You have unexpected bleeding.  You feel that you are having a life-threatening or urgent problem. This information is not intended to replace advice given to you by your health care provider. Make sure you discuss any questions you have with your health care provider. Document Released: 03/05/2001 Document Revised: 05/01/2016 Document Reviewed: 11/11/2015 Elsevier Interactive Patient Education  2017 [employer].

## 2016-12-07 ENCOUNTER — Encounter (HOSPITAL_COMMUNITY)
Admission: RE | Admit: 2016-12-07 | Discharge: 2016-12-07 | Disposition: A | Discharge status: Home / Self Care | Payer: Medicare Other | Source: Ambulatory Visit | Attending: Obstetrics and Gynecology | Admitting: Obstetrics and Gynecology

## 2016-12-07 ENCOUNTER — Other Ambulatory Visit: Payer: Self-pay | Admitting: Obstetrics and Gynecology

## 2016-12-25 ENCOUNTER — Encounter: Payer: Medicare Other | Admitting: Obstetrics and Gynecology

## 2017-01-05 ENCOUNTER — Encounter: Payer: Medicare Other | Admitting: Obstetrics and Gynecology

## 2017-01-08 ENCOUNTER — Encounter: Payer: Medicare Other | Admitting: Obstetrics and Gynecology

## 2017-01-09 ENCOUNTER — Other Ambulatory Visit (HOSPITAL_COMMUNITY): Payer: Medicare Other

## 2017-01-11 NOTE — Patient Instructions (Signed)
Mary [name]  01/11/2017     @PREFPERIOPPHARMACY @   Your procedure is scheduled on   01/19/2017   Report to [facility] at   615  A.M.  Call this number if you have problems the morning of surgery:  [phone]   Remember:  Do not eat food or drink liquids after midnight.  Take these medicines the morning of surgery with A SIP OF WATER  Fioricet, singulair, effexor.   Do not wear jewelry, make-up or nail polish.  Do not wear lotions, powders, or perfumes, or deoderant.  Do not shave 48 hours prior to surgery.  Men may shave face and neck.  Do not bring valuables to the hospital.  [facility] is not responsible for any belongings or valuables.  Contacts, dentures or bridgework may not be worn into surgery.  Leave your suitcase in the car.  After surgery it may be brought to your room.  For patients admitted to the hospital, discharge time will be determined by your treatment team.  Patients discharged the day of surgery will not be allowed to drive home.   Name and phone number of your driver:   family Special instructions:  None  Please read over the following fact sheets that you were given. Anesthesia Post-op Instructions and Care and Recovery After Surgery       Salpingectomy Salpingectomy, also called tubectomy, is the surgical removal of one of the fallopian tubes. The fallopian tubes are where eggs travel from the ovaries to the uterus. Removing one fallopian tube does not prevent you from becoming pregnant. It also does not cause problems with your menstrual periods. You may need a salpingectomy if you:  Have a fertilized egg that attaches to the fallopian tube (ectopic pregnancy), especially one that causes the tube to burst or tear (rupture).  Have an infected fallopian tube.  Have cancer of the fallopian tube or nearby organs.  Have had an ovary removed due to a cyst or tumor.  Have had your uterus removed. There are three different methods  that can be used for a salpingectomy:  Open. This method involves making one large incision in your abdomen.  Laparoscopic. This method involves using a thin, lighted tube with a tiny camera on the end (laparoscope) to help perform the procedure. The laparoscope will allow your surgeon to make several small incisions in the abdomen instead of a large incision.  Robot-assisted: This method involves using a computer to control surgical instruments that are attached to robotic arms. Tell a health care provider about:  Any allergies you have.  All medicines you are taking, including vitamins, herbs, eye drops, creams, and over-the-counter medicines.  Any problems you or family members have had with anesthetic medicines.  Any blood disorders you have.  Any surgeries you have had.  Any medical conditions you have.  Whether you are pregnant or may be pregnant. What are the risks? Generally, this is a safe procedure. However, problems may occur, including:  Infection.  Bleeding.  Allergic reactions to medicines.  Damage to other structures or organs.  Blood clots in the legs or lungs. What happens before the procedure? Staying hydrated  Follow instructions from your health care provider about hydration, which may include:  Up to 2 hours before the procedure - you may continue to drink clear liquids, such as water, clear fruit juice, black coffee, and plain tea. Eating and drinking restrictions  Follow instructions from your health  care provider about eating and drinking, which may include:  8 hours before the procedure - stop eating heavy meals or foods such as meat, fried foods, or fatty foods.  6 hours before the procedure - stop eating light meals or foods, such as toast or cereal.  6 hours before the procedure - stop drinking milk or drinks that contain milk.  2 hours before the procedure - stop drinking clear liquids. Medicines  Ask your health care provider  about:  Changing or stopping your regular medicines. This is especially important if you are taking diabetes medicines or blood thinners.  Taking medicines such as aspirin and ibuprofen. These medicines can thin your blood. Do not take these medicines before your procedure if your health care provider instructs you not to.  You may be given antibiotic medicine to help prevent infection. General instructions  Do not smoke for at least 2 weeks before your procedure. If you need help quitting, ask your health care provider.  You may have an exam or tests, such as an electrocardiogram (ECG).  You may have a blood or urine sample taken.  Ask your health care provider:  Whether you should stop removing hair from your surgical area.  How your surgical site will be marked or identified.  You may be asked to shower with a germ-killing soap.  Plan to have someone take you home from the hospital or clinic.  If you will be going home right after the procedure, plan to have someone with you for 24 hours. What happens during the procedure?  To reduce your risk of infection:  Your health care team will wash or sanitize their hands.  Hair may be removed from the surgical area.  Your skin will be washed with soap.  An IV tube will be inserted into one of your veins.  You will be given a medicine to make you fall asleep (general anesthetic). You may also be given a medicine to help you relax (sedative).  A thin tube (catheter) may be inserted through your urethra and into your bladder to drain urine during your procedure.  Depending on the type of procedure you are having, one incision or several small incisions will be made in your abdomen.  Your fallopian tube will be cut and removed from where it attaches to your uterus.  Your blood vessels will be clamped and tied to prevent excess bleeding.  The incision(s) in your abdomen will be closed with stitches (sutures), staples, or skin  glue.  A bandage (dressing) may be placed over your incision(s). The procedure may vary among health care providers and hospitals. What happens after the procedure?  Your blood pressure, heart rate, breathing rate, and blood oxygen level will be monitored until the medicines you were given have worn off.  You may continue to receive fluids and medicines through an IV tube.  You may continue to have a catheter draining your urine.  You may have to wear compression stockings. These stockings help to prevent blood clots and reduce swelling in your legs.  You will be given pain medicine as needed.  Do not drive for 24 hours if you received a sedative. Summary  Salpingectomy is a surgical procedure to remove one of the fallopian tubes.  The procedure may be done with an open incision, with a laparoscope, or with computer-controlled instruments.  Depending on the type of procedure you are having, one incision or several small incisions will be made in your abdomen.  Your  blood pressure, heart rate, breathing rate, and blood oxygen level will be monitored until the medicines you were given have worn off.  Plan to have someone take you home from the hospital or clinic. This information is not intended to replace advice given to you by your health care provider. Make sure you discuss any questions you have with your health care provider. Document Released: 04/15/2009 Document Revised: 07/14/2016 Document Reviewed: 05/21/2013 Elsevier Interactive Patient Education  2017 [employer]  Salpingectomy, Care After This sheet gives you information about how to care for yourself after your procedure. Your health care provider may also give you more specific instructions. If you have problems or questions, contact your health care provider. What can I expect after the procedure? After your procedure, it is common to have:  Pain in your abdomen.  Some occasional vaginal bleeding  (spotting).  Tiredness. Follow these instructions at home: Incision care  Keep your incision area and your bandage (dressing) clean and dry.  Follow instructions from your health care provider about how to take care of your incision. Make sure you:  Wash your hands with soap and water before you change your dressing. If soap and water are not available, use hand sanitizer.  Change your dressing astold by your health care provider.  Leave stitches (sutures), staples, skin glue, or adhesive strips in place. These skin closures may need to stay in place for 2 weeks or longer. If adhesive strip edges start to loosen and curl up, you may trim the loose edges. Do not remove adhesive strips completely unless your health care provider tells you to do that.  Check your incision area every day for signs of infection. Check for:  More redness, swelling, or pain.  More fluid or blood.  Warmth.  Pus or a bad smell. Activity  Do not drive or use heavy machinery while taking prescription pain medicine.  Do not drive for 24 hours if you received a medicine to help you relax (sedative).  Rest as directed by your health care provider. Ask your health care provider what activities are safe for you. You should avoid:  Lifting anything that is heavier than 10 lb (4.5 kg) until your health care provider approves.  Activities that require a lot of energy.  Until your health care provider approves:  Do not douche.  Do not use tampons.  Do not have sexual intercourse. General instructions  Take over-the-counter and prescription medicines only as told by your health care provider.  To prevent or treat constipation while you are taking prescription pain medicine, your health care provider may recommend that you:  Drink enough fluid to keep your urine clear or pale yellow.  Take over-the-counter or prescription medicines.  Eat foods that are high in fiber, such as fresh fruits and  vegetables, whole grains, and beans.  Limit foods that are high in fat and processed sugars, such as fried and sweet foods.  Do not take baths, swim, or use a hot tub until your health care provider approves. You may take showers.  Wear compression stockings as told by your health care provider. These stockings help to prevent blood clots and reduce swelling in your legs.  Keep all follow-up visits as told by your health care provider. This is important. Contact a health care provider if:  You have:  Pain when you urinate.  More redness, swelling, or pain around your incision.  More fluid or blood coming from your incision.  Pus or a bad  smell coming from your incision.  A fever.  Abdominal pain that gets worse or does not get better with medicine.  Your incision feels warm to the touch.  Your incision starts to break open.  You develop a rash.  You develop nausea and vomiting.  You feel light-headed. Get help right away if:  You develop pain in your chest or leg.  You develop shortness of breath.  You faint.  You have increased vaginal bleeding. This information is not intended to replace advice given to you by your health care provider. Make sure you discuss any questions you have with your health care provider. Document Released: 03/03/2011 Document Revised: 07/26/2016 Document Reviewed: 07/27/2016 Elsevier Interactive Patient Education  2017 Else[employer] Inc.  General Anesthesia, Adult General anesthesia is the use of medicines to make a person "go to sleep" (be unconscious) for a medical procedure. General anesthesia is often recommended when a procedure:  Is long.  Requires you to be still or in an unusual position.  Is major and can cause you to lose blood.  Is impossible to do without general anesthesia. The medicines used for general anesthesia are called general anesthetics. In addition to making you sleep, the medicines:  Prevent pain.  Control  your blood pressure.  Relax your muscles. Tell a health care provider about:  Any allergies you have.  All medicines you are taking, including vitamins, herbs, eye drops, creams, and over-the-counter medicines.  Any problems you or family members have had with anesthetic medicines.  Types of anesthetics you have had in the past.  Any bleeding disorders you have.  Any surgeries you have had.  Any medical conditions you have.  Any history of heart or lung conditions, such as heart failure, sleep apnea, or chronic obstructive pulmonary disease (COPD).  Whether you are pregnant or may be pregnant.  Whether you use tobacco, alcohol, marijuana, or street drugs.  Any history of [employer].  Any history of depression or anxiety. What are the risks? Generally, this is a safe procedure. However, problems may occur, including:  Allergic reaction to anesthetics.  Lung and heart problems.  Inhaling food or liquids from your stomach into your lungs (aspiration).  Injury to nerves.  Waking up during your procedure and being unable to move (rare).  Extreme agitation or a state of mental confusion (delirium) when you wake up from the anesthetic.  Air in the bloodstream, which can lead to stroke. These problems are more likely to develop if you are having a major surgery or if you have an advanced medical condition. You can prevent some of these complications by answering all of your health care provider's questions thoroughly and by following all pre-procedure instructions. General anesthesia can cause side effects, including:  Nausea or vomiting  A sore throat from the breathing tube.  Feeling cold or shivery.  Feeling tired, washed out, or achy.  Sleepiness or drowsiness.  Confusion or agitation. What happens before the procedure? Staying hydrated  Follow instructions from your health care provider about hydration, which may include:  Up to 2 hours before the  procedure - you may continue to drink clear liquids, such as water, clear fruit juice, black coffee, and plain tea. Eating and drinking restrictions  Follow instructions from your health care provider about eating and drinking, which may include:  8 hours before the procedure - stop eating heavy meals or foods such as meat, fried foods, or fatty foods.  6 hours before the procedure - stop eating  light meals or foods, such as toast or cereal.  6 hours before the procedure - stop drinking milk or drinks that contain milk.  2 hours before the procedure - stop drinking clear liquids. Medicines  Ask your health care provider about:  Changing or stopping your regular medicines. This is especially important if you are taking diabetes medicines or blood thinners.  Taking medicines such as aspirin and ibuprofen. These medicines can thin your blood. Do not take these medicines before your procedure if your health care provider instructs you not to.  Taking new dietary supplements or medicines. Do not take these during the week before your procedure unless your health care provider approves them.  If you are told to take a medicine or to continue taking a medicine on the day of the procedure, take the medicine with sips of water. General instructions   Ask if you will be going home the same day, the following day, or after a longer hospital stay.  Plan to have someone take you home.  Plan to have someone stay with you for the first 24 hours after you leave the hospital or clinic.  For 3-6 weeks before the procedure, try not to use any tobacco products, such as cigarettes, chewing tobacco, and e-cigarettes.  You may brush your teeth on the morning of the procedure, but make sure to spit out the toothpaste. What happens during the procedure?  You will be given anesthetics through a mask and through an IV tube in one of your veins.  You may receive medicine to help you relax (sedative).  As  soon as you are asleep, a breathing tube may be used to help you breathe.  An anesthesia specialist will stay with you throughout the procedure. He or she will help keep you comfortable and safe by continuing to give you medicines and adjusting the amount of medicine that you get. He or she will also watch your blood pressure, pulse, and oxygen levels to make sure that the anesthetics do not cause any problems.  If a breathing tube was used to help you breathe, it will be removed before you wake up. The procedure may vary among health care providers and hospitals. What happens after the procedure?  You will wake up, often slowly, after the procedure is complete, usually in a recovery area.  Your blood pressure, heart rate, breathing rate, and blood oxygen level will be monitored until the medicines you were given have worn off.  You may be given medicine to help you calm down if you feel anxious or agitated.  If you will be going home the same day, your health care provider may check to make sure you can stand, drink, and urinate.  Your health care providers will treat your pain and side effects before you go home.  Do not drive for 24 hours if you received a sedative.  You may:  Feel nauseous and vomit.  Have a sore throat.  Have mental slowness.  Feel cold or shivery.  Feel sleepy.  Feel tired.  Feel sore or achy, even in parts of your body where you did not have surgery. This information is not intended to replace advice given to you by your health care provider. Make sure you discuss any questions you have with your health care provider. Document Released: 03/05/2008 Document Revised: 05/09/2016 Document Reviewed: 11/11/2015 Elsevier Interactive Patient Education  2017 Else[employer] Inc. General Anesthesia, Adult, Care After These instructions provide you with information about caring for  yourself after your procedure. Your health care provider may also give you more specific  instructions. Your treatment has been planned according to current medical practices, but problems sometimes occur. Call your health care provider if you have any problems or questions after your procedure. What can I expect after the procedure? After the procedure, it is common to have:  Vomiting.  A sore throat.  Mental slowness. It is common to feel:  Nauseous.  Cold or shivery.  Sleepy.  Tired.  Sore or achy, even in parts of your body where you did not have surgery. Follow these instructions at home: For at least 24 hours after the procedure:  Do not:  Participate in activities where you could fall or become injured.  Drive.  Use heavy machinery.  Drink alcohol.  Take sleeping pills or medicines that cause drowsiness.  Make important decisions or sign legal documents.  Take care of children on your own.  Rest. Eating and drinking  If you vomit, drink water, juice, or soup when you can drink without vomiting.  Drink enough fluid to keep your urine clear or pale yellow.  Make sure you have little or no nausea before eating solid foods.  Follow the diet recommended by your health care provider. General instructions  Have a responsible adult stay with you until you are awake and alert.  Return to your normal activities as told by your health care provider. Ask your health care provider what activities are safe for you.  Take over-the-counter and prescription medicines only as told by your health care provider.  If you smoke, do not smoke without supervision.  Keep all follow-up visits as told by your health care provider. This is important. Contact a health care provider if:  You continue to have nausea or vomiting at home, and medicines are not helpful.  You cannot drink fluids or start eating again.  You cannot urinate after 8-12 hours.  You develop a skin rash.  You have fever.  You have increasing redness at the site of your procedure. Get  help right away if:  You have difficulty breathing.  You have chest pain.  You have unexpected bleeding.  You feel that you are having a life-threatening or urgent problem. This information is not intended to replace advice given to you by your health care provider. Make sure you discuss any questions you have with your health care provider. Document Released: 03/05/2001 Document Revised: 05/01/2016 Document Reviewed: 11/11/2015 Elsevier Interactive Patient Education  2017 [employer].

## 2017-01-15 ENCOUNTER — Encounter (HOSPITAL_COMMUNITY): Payer: Self-pay

## 2017-01-15 ENCOUNTER — Encounter (HOSPITAL_COMMUNITY)
Admission: RE | Admit: 2017-01-15 | Discharge: 2017-01-15 | Disposition: A | Discharge status: Home / Self Care | Payer: Medicare Other | Source: Ambulatory Visit | Attending: Obstetrics and Gynecology | Admitting: Obstetrics and Gynecology

## 2017-01-19 ENCOUNTER — Encounter (HOSPITAL_COMMUNITY): Admission: RE | Payer: Self-pay | Source: Ambulatory Visit

## 2017-01-19 ENCOUNTER — Ambulatory Visit (HOSPITAL_COMMUNITY)
Admission: RE | Admit: 2017-01-19 | Payer: Medicare Other | Source: Ambulatory Visit | Admitting: Obstetrics and Gynecology

## 2017-01-19 SURGERY — SALPINGECTOMY, BILATERAL, LAPAROSCOPIC
Anesthesia: Choice | Laterality: Bilateral

## 2017-01-22 ENCOUNTER — Encounter: Payer: Medicare Other | Admitting: Obstetrics and Gynecology

## 2017-01-29 ENCOUNTER — Encounter: Payer: Medicare Other | Admitting: Obstetrics and Gynecology

## 2017-02-05 ENCOUNTER — Ambulatory Visit: Payer: Medicare Other | Admitting: Adult Health

## 2017-02-09 ENCOUNTER — Ambulatory Visit: Payer: Medicare Other | Admitting: Adult Health

## 2017-02-12 DIAGNOSIS — F332 Major depressive disorder, recurrent severe without psychotic features: Secondary | ICD-10-CM | POA: Diagnosis not present

## 2017-02-15 ENCOUNTER — Ambulatory Visit: Payer: Medicare Other | Admitting: Adult Health

## 2017-02-21 ENCOUNTER — Ambulatory Visit (INDEPENDENT_AMBULATORY_CARE_PROVIDER_SITE_OTHER): Payer: Medicare Other | Admitting: Adult Health

## 2017-02-21 ENCOUNTER — Encounter: Payer: Self-pay | Admitting: Adult Health

## 2017-02-21 ENCOUNTER — Telehealth: Payer: Self-pay | Admitting: Adult Health

## 2017-02-21 VITALS — BP 126/70 | HR 88 | Ht 64.0 in | Wt 168.0 lb

## 2017-02-21 DIAGNOSIS — Z30011 Encounter for initial prescription of contraceptive pills: Secondary | ICD-10-CM | POA: Insufficient documentation

## 2017-02-21 DIAGNOSIS — G44209 Tension-type headache, unspecified, not intractable: Secondary | ICD-10-CM | POA: Diagnosis not present

## 2017-02-21 MED ORDER — BUTALBITAL-APAP-CAFFEINE 50-325-40 MG PO TABS: 1.0000 | ORAL_TABLET | Freq: Four times a day (QID) | ORAL | 1 refills | Status: DC | PRN

## 2017-02-21 MED ORDER — NORETHIN-ETH ESTRAD-FE BIPHAS 1 MG-10 MCG / 10 MCG PO TABS: 1.0000 | ORAL_TABLET | Freq: Every day | ORAL | 11 refills | Status: DC

## 2017-02-21 NOTE — Telephone Encounter (Signed)
Pt called stating that she is at her pharmacy and that her insurance does not pay for the medication that Victorino DikeJennifer has sent today. The pharmacy states that she would need to pick another medication, please contact pt

## 2017-02-21 NOTE — Patient Instructions (Signed)
Use condoms Follow up in 3 months Start lo loestrin with next period

## 2017-02-21 NOTE — Progress Notes (Signed)
Subjective:     Patient ID: Mary [name], female   DOB: 03/07/1986, 31 y.o.   MRN: [id number]  HPI Mary [name] is a 31 year old white female, married, G5P4 in to discuss birth control and wants refill on Fioricet for headaches.  Review of Systems +headache at times Desires birth control  Reviewed past medical,surgical, social and family history. Reviewed medications and allergies.     Objective:   Physical Exam BP 126/70 (BP Location: Right Arm, Patient Position: Sitting, Cuff Size: Large)   Pulse 88   Ht 5\' 4"  (1.626 m)   Wt 168 lb (76.2 kg)   LMP 01/25/2017   Breastfeeding? No   BMI 28.84 kg/m Skin warm and dry.  Lungs: clear to ausculation bilaterally. Cardiovascular: regular rate and rhythm.   Discussed birth control options and she wants to try OCs for now, not ready for tubal and does not want Nexplanon or depo.   Assessment:     1. Encounter for initial prescription of contraceptive pills   2.      Headache     Plan:     Rx lo loestrin disp 1 pack take 1 daily with 11 refills, start with next period Rx fioricet #20 take 1-2 every 6 hours prn headache with 1 refill  Use condoms Follow up in 3 months

## 2017-02-26 ENCOUNTER — Telehealth: Payer: Self-pay | Admitting: *Deleted

## 2017-02-26 ENCOUNTER — Ambulatory Visit: Payer: Self-pay | Admitting: Obstetrics & Gynecology

## 2017-02-26 NOTE — Telephone Encounter (Signed)
Left message I called 

## 2017-02-26 NOTE — Telephone Encounter (Signed)
Pt has not started period yet, is 4 days late check HPT 3/23, and let me know.Has OCs but has not started them

## 2017-04-05 DIAGNOSIS — R51 Headache: Secondary | ICD-10-CM | POA: Diagnosis not present

## 2017-04-05 DIAGNOSIS — F17200 Nicotine dependence, unspecified, uncomplicated: Secondary | ICD-10-CM | POA: Diagnosis not present

## 2017-04-05 DIAGNOSIS — M542 Cervicalgia: Secondary | ICD-10-CM | POA: Diagnosis not present

## 2017-04-05 DIAGNOSIS — Z91010 Allergy to peanuts: Secondary | ICD-10-CM | POA: Diagnosis not present

## 2017-04-05 DIAGNOSIS — Z882 Allergy status to sulfonamides status: Secondary | ICD-10-CM | POA: Diagnosis not present

## 2017-04-20 ENCOUNTER — Ambulatory Visit (INDEPENDENT_AMBULATORY_CARE_PROVIDER_SITE_OTHER): Payer: Medicare Other | Admitting: Family

## 2017-04-20 ENCOUNTER — Encounter: Payer: Self-pay | Admitting: Family

## 2017-04-20 VITALS — BP 123/88 | HR 88 | Temp 97.9°F | Ht 64.0 in | Wt 170.8 lb

## 2017-04-20 DIAGNOSIS — R51 Headache: Secondary | ICD-10-CM

## 2017-04-20 DIAGNOSIS — F17200 Nicotine dependence, unspecified, uncomplicated: Secondary | ICD-10-CM | POA: Diagnosis not present

## 2017-04-20 DIAGNOSIS — R519 Headache, unspecified: Secondary | ICD-10-CM | POA: Insufficient documentation

## 2017-04-20 MED ORDER — TOPIRAMATE 50 MG PO TABS: 50.0000 mg | ORAL_TABLET | Freq: Two times a day (BID) | ORAL | 2 refills | Status: DC
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
## 2017-04-20 NOTE — Patient Instructions (Signed)
Migraine Headache A migraine headache is an intense, throbbing pain on one side or both sides of the head. Migraines may also cause other symptoms, such as nausea, vomiting, and sensitivity to light and noise. What are the causes? Doing or taking certain things may also trigger migraines, such as:  Alcohol.  Smoking.  Medicines, such as: ? Medicine used to treat chest pain (nitroglycerine). ? Birth control pills. ? Estrogen pills. ? Certain blood pressure medicines.  Aged cheeses, chocolate, or caffeine.  Foods or drinks that contain nitrates, glutamate, aspartame, or tyramine.  Physical activity.  Other things that may trigger a migraine include:  Menstruation.  Pregnancy.  Hunger.  Stress, lack of sleep, too much sleep, or fatigue.  Weather changes.  What increases the risk? The following factors may make you more likely to experience migraine headaches:  Age. Risk increases with age.  Family history of migraine headaches.  Being Caucasian.  Depression and anxiety.  Obesity.  Being a woman.  Having a hole in the heart (patent foramen ovale) or other heart problems.  What are the signs or symptoms? The main symptom of this condition is pulsating or throbbing pain. Pain may:  Happen in any area of the head, such as on one side or both sides.  Interfere with daily activities.  Get worse with physical activity.  Get worse with exposure to bright lights or loud noises.  Other symptoms may include:  Nausea.  Vomiting.  Dizziness.  General sensitivity to bright lights, loud noises, or smells.  Before you get a migraine, you may get warning signs that a migraine is developing (aura). An aura may include:  Seeing flashing lights or having blind spots.  Seeing bright spots, halos, or zigzag lines.  Having tunnel vision or blurred vision.  Having numbness or a tingling feeling.  Having trouble talking.  Having muscle weakness.  How is this  diagnosed? A migraine headache can be diagnosed based on:  Your symptoms.  A physical exam.  Tests, such as CT scan or MRI of the head. These imaging tests can help rule out other causes of headaches.  Taking fluid from the spine (lumbar puncture) and analyzing it (cerebrospinal fluid analysis, or CSF analysis).  How is this treated? A migraine headache is usually treated with medicines that:  Relieve pain.  Relieve nausea.  Prevent migraines from coming back.  Treatment may also include:  Acupuncture.  Lifestyle changes like avoiding foods that trigger migraines.  Follow these instructions at home: Medicines  Take over-the-counter and prescription medicines only as told by your health care provider.  Do not drive or use heavy machinery while taking prescription pain medicine.  To prevent or treat constipation while you are taking prescription pain medicine, your health care provider may recommend that you: ? Drink enough fluid to keep your urine clear or pale yellow. ? Take over-the-counter or prescription medicines. ? Eat foods that are high in fiber, such as fresh fruits and vegetables, whole grains, and beans. ? Limit foods that are high in fat and processed sugars, such as fried and sweet foods. Lifestyle  Avoid alcohol use.  Do not use any products that contain nicotine or tobacco, such as cigarettes and e-cigarettes. If you need help quitting, ask your health care provider.  Get at least 8 hours of sleep every night.  Limit your stress. General instructions   Keep a journal to find out what may trigger your migraine headaches. For example, write down: ? What you eat and   drink. ? How much sleep you get. ? Any change to your diet or medicines.  If you have a migraine: ? Avoid things that make your symptoms worse, such as bright lights. ? It may help to lie down in a dark, quiet room. ? Do not drive or use heavy machinery. ? Ask your health care provider  what activities are safe for you while you are experiencing symptoms.  Keep all follow-up visits as told by your health care provider. This is important. Contact a health care provider if:  You develop symptoms that are different or more severe than your usual migraine symptoms. Get help right away if:  Your migraine becomes severe.  You have a fever.  You have a stiff neck.  You have vision loss.  Your muscles feel weak or like you cannot control them.  You start to lose your balance often.  You develop trouble walking.  You faint. This information is not intended to replace advice given to you by your health care provider. Make sure you discuss any questions you have with your health care provider. Document Released: 11/27/2005 Document Revised: 06/16/2016 Document Reviewed: 05/15/2016 Elsevier Interactive Patient Education  2017 Elsevier Inc.   

## 2017-04-26 ENCOUNTER — Telehealth: Payer: Self-pay | Admitting: Family

## 2017-04-26 NOTE — Telephone Encounter (Signed)
Patient says topamax caused drowsiness, tingle in fingers, muscle weakness and irritability. She took this medicine for three days.  Please send a new medicine script to [facility] for her to try.

## 2017-04-27 MED ORDER — PROPRANOLOL HCL 40 MG PO TABS: 40.0000 mg | ORAL_TABLET | Freq: Two times a day (BID) | ORAL | 3 refills | Status: DC

## 2017-04-27 NOTE — Telephone Encounter (Signed)
Stop Topamax and start propranolol. Keep follow up appt! Avoid caffeine, stress, alcohol, and get enough sleep.

## 2017-05-03 NOTE — Telephone Encounter (Signed)
Patient aware and verbalizes understanding. 

## 2017-05-15 ENCOUNTER — Ambulatory Visit: Payer: Medicare Other | Admitting: Family

## 2017-05-15 ENCOUNTER — Ambulatory Visit (INDEPENDENT_AMBULATORY_CARE_PROVIDER_SITE_OTHER): Payer: Medicare Other | Admitting: Family

## 2017-05-15 ENCOUNTER — Encounter: Payer: Self-pay | Admitting: Family

## 2017-05-15 VITALS — BP 120/83 | HR 95 | Temp 97.8°F | Ht 64.0 in | Wt 169.2 lb

## 2017-05-15 DIAGNOSIS — R51 Headache: Secondary | ICD-10-CM | POA: Diagnosis not present

## 2017-05-15 DIAGNOSIS — F419 Anxiety disorder, unspecified: Secondary | ICD-10-CM | POA: Diagnosis not present

## 2017-05-15 DIAGNOSIS — R519 Headache, unspecified: Secondary | ICD-10-CM

## 2017-05-15 MED ORDER — PROPRANOLOL HCL 60 MG PO TABS: 60.0000 mg | ORAL_TABLET | Freq: Two times a day (BID) | ORAL | 1 refills | Status: DC

## 2017-05-15 NOTE — Progress Notes (Signed)
   Subjective:    Patient ID: Mary [name], female    DOB: 07/06/1986, 31 y.o.   MRN: [id number]  PT presents to the office today to recheck migraines. Pt was started on Topamax, but states she was having weakness, drowsiness, and tingle in her fingers. PT was told to stop the Topamax and was given Inderal 40 mg BID. States this is helping and improving her headaches. PT states her headaches are every other day instead of daily. She reports that she has not taken it in the last two days because she has had "a lot going on", but is restarting it. [facility], but her xanax and Adderall was discontinued and she does not want to be on an "antidepressant". PT is currently looking for a new therapists  Migraine   This is a recurrent problem. The problem has been waxing and waning. The pain is located in the occipital region. The pain quality is similar to prior headaches. The quality of the pain is described as aching. The pain is at a severity of 9/10. The symptoms are aggravated by bright light. She has tried beta blockers for the symptoms. The treatment provided mild relief.      Review of Systems  All other systems reviewed and are negative.      Objective:   Physical Exam  Constitutional: She is oriented to person, place, and time. She appears well-developed and well-nourished. No distress.  HENT:  Head: Normocephalic and atraumatic.  Right Ear: External ear normal.  Left Ear: External ear normal.  Nose: Nose normal.  Mouth/Throat: Oropharynx is clear and moist.  Eyes: Pupils are equal, round, and reactive to light.  Neck: Normal range of motion. Neck supple. No thyromegaly present.  Cardiovascular: Normal rate, regular rhythm, normal heart sounds and intact distal pulses.   No murmur heard. Pulmonary/Chest: Effort normal and breath sounds normal. No respiratory distress. She has no wheezes.  Abdominal: Soft. Bowel sounds are normal. She exhibits no distension.  There is no tenderness.  Musculoskeletal: Normal range of motion. She exhibits no edema or tenderness.  Neurological: She is alert and oriented to person, place, and time.  Skin: Skin is warm and dry.  Psychiatric: She has a normal mood and affect. Her behavior is normal. Judgment and thought content normal.  Vitals reviewed.     BP 120/83   Pulse 95   Temp 97.8 F (36.6 C) (Oral)   Ht 5\' 4"  (1.626 m)   Wt 169 lb 3.2 oz (76.7 kg)   BMI 29.04 kg/m      Assessment & Plan:  1. Intractable headache, unspecified chronicity pattern, unspecified headache type -Will increase Inderal to 60 mg BID from 40 mg Stress management discussed Limit caffeine and alcohol  Encourage 8-9 hours of sleep a night RTO in 6 weeks, if headaches do not continue to improve will need to send to neurologists  - propranolol (INDERAL) 60 MG tablet; Take 1 tablet (60 mg total) by mouth 2 (two) times daily.  Dispense: 60 tablet; Refill: 1  2. Anxiety PT encouraged to make appt with Behavioral Health  Stress management     Jannifer Rodneyhristy Malyia Moro, FNP

## 2017-05-15 NOTE — Patient Instructions (Signed)

## 2017-05-24 ENCOUNTER — Ambulatory Visit: Payer: Medicare Other | Admitting: Adult Health

## 2017-05-28 ENCOUNTER — Ambulatory Visit (INDEPENDENT_AMBULATORY_CARE_PROVIDER_SITE_OTHER): Payer: Medicare Other | Admitting: Adult Health

## 2017-05-28 ENCOUNTER — Encounter: Payer: Self-pay | Admitting: Adult Health

## 2017-05-28 VITALS — BP 124/82 | HR 83 | Ht 64.0 in | Wt 168.0 lb

## 2017-05-28 DIAGNOSIS — Z3041 Encounter for surveillance of contraceptive pills: Secondary | ICD-10-CM

## 2017-05-28 DIAGNOSIS — Z3202 Encounter for pregnancy test, result negative: Secondary | ICD-10-CM | POA: Diagnosis not present

## 2017-05-28 LAB — POCT URINE PREGNANCY: Preg Test, Ur: NEGATIVE

## 2017-05-28 MED ORDER — NORETHIN-ETH ESTRAD-FE BIPHAS 1 MG-10 MCG / 10 MCG PO TABS: 1.0000 | ORAL_TABLET | Freq: Every day | ORAL | 11 refills | Status: DC

## 2017-05-28 NOTE — Progress Notes (Signed)
Subjective:     Patient ID: Mary [name], female   DOB: 11/07/1986, 31 y.o.   MRN: [id number]  HPI Mary [name] is a 31 year old white female, married,in to talk birth control had been on lo loestrin and stopped, because she said pharmacist told her it reacted with inderal.   Review of Systems Headaches better Reviewed past medical,surgical, social and family history. Reviewed medications and allergies.     Objective:   Physical Exam BP 124/82 (BP Location: Right Arm, Patient Position: Sitting, Cuff Size: Normal)   Pulse 83   Ht 5\' 4"  (1.626 m)   Wt 168 lb (76.2 kg)   LMP 04/29/2017   Breastfeeding? No   BMI 28.84 kg/m UPT negative,Skin warm and dry. Lungs: clear to ausculation bilaterally. Cardiovascular: regular rate and rhythm.I looked it up in [facility] and called local pharmacy and spoke with pharmacist and she said there was no interaction between inderal and OCs.So she will restart with next period, even talked about IUD as option.     Assessment:     1. Encounter for surveillance of contraceptive pills   2. Pregnancy examination or test, negative result       Plan:     Rx lo loestrin disp 1 pack take 1 daily with 11 refills, start with next period Use condoms Follow up prn

## 2017-10-09 ENCOUNTER — Ambulatory Visit (INDEPENDENT_AMBULATORY_CARE_PROVIDER_SITE_OTHER): Payer: Medicare Other | Admitting: Family

## 2017-10-09 ENCOUNTER — Encounter: Payer: Self-pay | Admitting: Neurology

## 2017-10-09 ENCOUNTER — Encounter: Payer: Self-pay | Admitting: Family

## 2017-10-09 VITALS — BP 129/88 | HR 81 | Temp 97.8°F | Ht 64.0 in | Wt 168.2 lb

## 2017-10-09 DIAGNOSIS — R519 Headache, unspecified: Secondary | ICD-10-CM

## 2017-10-09 DIAGNOSIS — R51 Headache: Secondary | ICD-10-CM

## 2017-10-09 NOTE — Progress Notes (Addendum)
   Subjective:    Patient ID: Mary [name], female    DOB: 1986-10-27, 31 y.o.   MRN: [id number]  Pt presents to the office today to recheck headaches. Pt was seen in June and increased her Inderal to 60 mg BID. Pt states she stopped the Inderal. She states the inderal did help with decreasing the pain of the headaches, but the "dullness" was still there.   Pt had eye examined in May 2018 and got new glasses. This did not help with headaches.   Headache   This is a recurrent problem. The current episode started more than 1 year ago. The problem occurs daily. The problem has been unchanged. The pain is located in the occipital and left unilateral region. The pain quality is similar to prior headaches. The quality of the pain is described as aching and dull. The pain is at a severity of 10/10. The pain is moderate. Associated symptoms include blurred vision, dizziness, nausea, phonophobia and photophobia. Pertinent negatives include no vomiting. The symptoms are aggravated by emotional stress. She has tried beta blockers, Excedrin and NSAIDs for the symptoms. The treatment provided mild relief. Her past medical history is significant for migraines in the family.   [facility] and will scan into chart. Negative from 10/26/16.   Review of Systems  Eyes: Positive for blurred vision and photophobia.  Gastrointestinal: Positive for nausea. Negative for vomiting.  Neurological: Positive for dizziness and headaches.       Objective:   Physical Exam  Constitutional: She is oriented to person, place, and time. She appears well-developed and well-nourished. No distress.  HENT:  Head: Normocephalic and atraumatic.  Right Ear: External ear normal.  Left Ear: External ear normal.  Nose: Nose normal.  Mouth/Throat: Oropharynx is clear and moist.  Frequent eye blinking  Eyes: Pupils are equal, round, and reactive to light.  Neck: Normal range of motion. Neck supple.  No thyromegaly present.  Cardiovascular: Normal rate, regular rhythm and intact distal pulses.  Exam reveals gallop.   No murmur heard. Pulmonary/Chest: Effort normal and breath sounds normal. No respiratory distress. She has no wheezes.  Abdominal: Soft. Bowel sounds are normal. She exhibits no distension. There is no tenderness.  Musculoskeletal: Normal range of motion. She exhibits no edema or tenderness.  Decrease balance, positive Romberg test  Neurological: She is alert and oriented to person, place, and time. She has normal reflexes. No cranial nerve deficit.  Skin: Skin is warm and dry.  Psychiatric: She has a normal mood and affect. Her behavior is normal. Judgment and thought content normal.  Vitals reviewed.     BP 129/88   Pulse 81   Temp 97.8 F (36.6 C) (Oral)   Ht 5\' 4"  (1.626 m)   Wt 168 lb 3.2 oz (76.3 kg)   BMI 28.87 kg/m      Assessment & Plan:  1. Intractable headache, unspecified chronicity pattern, unspecified headache type - Ambulatory referral to Neurology  PT had negative  Ct scan head of 10/26/16 Will do neurologists referral  Stress management Avoid alcohol, caffeine, and tobacco  Encouraged 8 hours of sleep a night If headaches worsen or changes in speech, vision, or gait go to ED   Jannifer Rodneyhristy Sarabeth Benton, FNP

## 2017-10-09 NOTE — Patient Instructions (Signed)
Migraine Headache A migraine headache is an intense, throbbing pain on one side or both sides of the head. Migraines may also cause other symptoms, such as nausea, vomiting, and sensitivity to light and noise. What are the causes? Doing or taking certain things may also trigger migraines, such as:  Alcohol.  Smoking.  Medicines, such as: ? Medicine used to treat chest pain (nitroglycerine). ? Birth control pills. ? Estrogen pills. ? Certain blood pressure medicines.  Aged cheeses, chocolate, or caffeine.  Foods or drinks that contain nitrates, glutamate, aspartame, or tyramine.  Physical activity.  Other things that may trigger a migraine include:  Menstruation.  Pregnancy.  Hunger.  Stress, lack of sleep, too much sleep, or fatigue.  Weather changes.  What increases the risk? The following factors may make you more likely to experience migraine headaches:  Age. Risk increases with age.  Family history of migraine headaches.  Being Caucasian.  Depression and anxiety.  Obesity.  Being a woman.  Having a hole in the heart (patent foramen ovale) or other heart problems.  What are the signs or symptoms? The main symptom of this condition is pulsating or throbbing pain. Pain may:  Happen in any area of the head, such as on one side or both sides.  Interfere with daily activities.  Get worse with physical activity.  Get worse with exposure to bright lights or loud noises.  Other symptoms may include:  Nausea.  Vomiting.  Dizziness.  General sensitivity to bright lights, loud noises, or smells.  Before you get a migraine, you may get warning signs that a migraine is developing (aura). An aura may include:  Seeing flashing lights or having blind spots.  Seeing bright spots, halos, or zigzag lines.  Having tunnel vision or blurred vision.  Having numbness or a tingling feeling.  Having trouble talking.  Having muscle weakness.  How is this  diagnosed? A migraine headache can be diagnosed based on:  Your symptoms.  A physical exam.  Tests, such as CT scan or MRI of the head. These imaging tests can help rule out other causes of headaches.  Taking fluid from the spine (lumbar puncture) and analyzing it (cerebrospinal fluid analysis, or CSF analysis).  How is this treated? A migraine headache is usually treated with medicines that:  Relieve pain.  Relieve nausea.  Prevent migraines from coming back.  Treatment may also include:  Acupuncture.  Lifestyle changes like avoiding foods that trigger migraines.  Follow these instructions at home: Medicines  Take over-the-counter and prescription medicines only as told by your health care provider.  Do not drive or use heavy machinery while taking prescription pain medicine.  To prevent or treat constipation while you are taking prescription pain medicine, your health care provider may recommend that you: ? Drink enough fluid to keep your urine clear or pale yellow. ? Take over-the-counter or prescription medicines. ? Eat foods that are high in fiber, such as fresh fruits and vegetables, whole grains, and beans. ? Limit foods that are high in fat and processed sugars, such as fried and sweet foods. Lifestyle  Avoid alcohol use.  Do not use any products that contain nicotine or tobacco, such as cigarettes and e-cigarettes. If you need help quitting, ask your health care provider.  Get at least 8 hours of sleep every night.  Limit your stress. General instructions   Keep a journal to find out what may trigger your migraine headaches. For example, write down: ? What you eat and   drink. ? How much sleep you get. ? Any change to your diet or medicines.  If you have a migraine: ? Avoid things that make your symptoms worse, such as bright lights. ? It may help to lie down in a dark, quiet room. ? Do not drive or use heavy machinery. ? Ask your health care provider  what activities are safe for you while you are experiencing symptoms.  Keep all follow-up visits as told by your health care provider. This is important. Contact a health care provider if:  You develop symptoms that are different or more severe than your usual migraine symptoms. Get help right away if:  Your migraine becomes severe.  You have a fever.  You have a stiff neck.  You have vision loss.  Your muscles feel weak or like you cannot control them.  You start to lose your balance often.  You develop trouble walking.  You faint. This information is not intended to replace advice given to you by your health care provider. Make sure you discuss any questions you have with your health care provider. Document Released: 11/27/2005 Document Revised: 06/16/2016 Document Reviewed: 05/15/2016 Elsevier Interactive Patient Education  2017 Elsevier Inc.   

## 2017-10-09 NOTE — Addendum Note (Signed)
Addended by: Jannifer RodneyHAWKS, Faren Florence A on: 10/09/2017 11:07 AM   Modules accepted: Level of Service

## 2017-11-16 DIAGNOSIS — Z91010 Allergy to peanuts: Secondary | ICD-10-CM | POA: Diagnosis not present

## 2017-11-16 DIAGNOSIS — M7989 Other specified soft tissue disorders: Secondary | ICD-10-CM | POA: Diagnosis not present

## 2017-11-16 DIAGNOSIS — M25572 Pain in left ankle and joints of left foot: Secondary | ICD-10-CM | POA: Diagnosis not present

## 2017-11-16 DIAGNOSIS — M25571 Pain in right ankle and joints of right foot: Secondary | ICD-10-CM | POA: Diagnosis not present

## 2017-11-16 DIAGNOSIS — Z882 Allergy status to sulfonamides status: Secondary | ICD-10-CM | POA: Diagnosis not present

## 2017-11-18 DIAGNOSIS — L03115 Cellulitis of right lower limb: Secondary | ICD-10-CM | POA: Diagnosis not present

## 2017-11-18 DIAGNOSIS — L03116 Cellulitis of left lower limb: Secondary | ICD-10-CM | POA: Diagnosis not present

## 2017-11-18 DIAGNOSIS — J45909 Unspecified asthma, uncomplicated: Secondary | ICD-10-CM | POA: Diagnosis not present

## 2017-11-18 DIAGNOSIS — Z79899 Other long term (current) drug therapy: Secondary | ICD-10-CM | POA: Diagnosis not present

## 2017-12-14 ENCOUNTER — Telehealth: Payer: Self-pay

## 2017-12-14 NOTE — Telephone Encounter (Signed)
[facility]   Writer left a voice mail message.  

## 2017-12-19 ENCOUNTER — Telehealth: Payer: Self-pay

## 2017-12-19 NOTE — Telephone Encounter (Signed)
[facility]   Writer left a voice mail message.  

## 2018-01-02 ENCOUNTER — Telehealth (HOSPITAL_COMMUNITY): Payer: Self-pay

## 2018-01-02 NOTE — Telephone Encounter (Signed)
[facility] left message 

## 2018-01-15 ENCOUNTER — Telehealth: Payer: Self-pay

## 2018-01-15 NOTE — Telephone Encounter (Signed)
[facility] - Patient declines services. Patient reports that she already has a therapist. .

## 2018-01-25 ENCOUNTER — Ambulatory Visit (INDEPENDENT_AMBULATORY_CARE_PROVIDER_SITE_OTHER): Payer: Medicare Other | Admitting: Neurology

## 2018-01-25 ENCOUNTER — Encounter: Payer: Self-pay | Admitting: Neurology

## 2018-01-25 VITALS — BP 118/80 | HR 96 | Ht 64.0 in | Wt 171.0 lb

## 2018-01-25 DIAGNOSIS — G43709 Chronic migraine without aura, not intractable, without status migrainosus: Secondary | ICD-10-CM

## 2018-01-25 DIAGNOSIS — M542 Cervicalgia: Secondary | ICD-10-CM

## 2018-01-25 DIAGNOSIS — R930 Abnormal findings on diagnostic imaging of skull and head, not elsewhere classified: Secondary | ICD-10-CM | POA: Diagnosis not present

## 2018-01-25 MED ORDER — SUMATRIPTAN SUCCINATE 50 MG PO TABS: ORAL_TABLET | ORAL | 2 refills | Status: DC

## 2018-01-25 MED ORDER — NORTRIPTYLINE HCL 10 MG PO CAPS: 10.0000 mg | ORAL_CAPSULE | Freq: Every day | ORAL | 3 refills | Status: DC

## 2018-01-25 NOTE — Patient Instructions (Signed)
Migraine Recommendations: 1.  Start nortriptyline 10mg  at bedtime.  Call in 4 weeks with update and we can adjust dose if needed. 2.  Take sumatriptan 50mg  at earliest onset of headache.  May repeat dose once in 2 hours if needed.  Do not exceed two tablets in 24 hours. 3.  Limit use of pain relievers to no more than 2 days out of the week.  These medications include acetaminophen, ibuprofen, triptans and narcotics.  This will help reduce risk of rebound headaches. 4.  Be aware of common food triggers such as processed sweets, processed foods with nitrites (such as deli meat, hot dogs, sausages), foods with MSG, alcohol (such as wine), chocolate, certain cheeses, certain fruits (dried fruits, bananas, some citrus fruit), vinegar, diet soda. 4.  Avoid caffeine 5.  Routine exercise 6.  Proper sleep hygiene 7.  Stay adequately hydrated with water 8.  Keep a headache diary. 9.  Maintain proper stress management. 10.  Do not skip meals. 11.  Consider supplements:  Turmeric 500mg  at bedtime to help with inflammation 12.  We will check MRI of brain 13.  I will refer you to Dr. Terrilee FilesZach Smith of Sports Medicine to help with neck pain. 14.  Follow up in 3 months.
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
## 2018-02-11 ENCOUNTER — Other Ambulatory Visit: Payer: Self-pay | Admitting: *Deleted

## 2018-02-11 ENCOUNTER — Ambulatory Visit
Admission: RE | Admit: 2018-02-11 | Discharge: 2018-02-11 | Disposition: A | Discharge status: Home / Self Care | Payer: Medicare Other | Source: Ambulatory Visit | Attending: Neurology | Admitting: Neurology

## 2018-02-11 ENCOUNTER — Telehealth: Payer: Self-pay | Admitting: Neurology

## 2018-02-11 DIAGNOSIS — R930 Abnormal findings on diagnostic imaging of skull and head, not elsewhere classified: Secondary | ICD-10-CM

## 2018-02-11 DIAGNOSIS — G43909 Migraine, unspecified, not intractable, without status migrainosus: Secondary | ICD-10-CM | POA: Diagnosis not present

## 2018-02-11 NOTE — Telephone Encounter (Signed)
Pt called an said her prescription is not at the pharmacy, she did not know th e name of the medication

## 2018-02-12 ENCOUNTER — Telehealth: Payer: Self-pay | Admitting: *Deleted

## 2018-02-12 NOTE — Telephone Encounter (Signed)
Left message for patient to call me back. 

## 2018-02-12 NOTE — Telephone Encounter (Signed)
Patient called pharmacy herself and was told the prescription was being held for her.  She is picking Imitrix up tomorrow.
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
## 2018-02-12 NOTE — Telephone Encounter (Signed)
-----   Message from Drema DallasAdam R Jaffe, DO sent at 02/12/2018  7:17 AM EST ----- MRI of brain does show a Chiari malformation which means that the cerebellum is slightly descended.  I think it is an incidental finding.  Some people have it and it doesn't cause any symptoms. There are also some little spots on the brain which are nonspecific and also not likely of any clinical significance.  Again, sometimes we see this.  In other words, I don't see anything concerning.

## 2018-02-13 ENCOUNTER — Other Ambulatory Visit: Payer: Self-pay

## 2018-02-13 ENCOUNTER — Encounter: Payer: Self-pay | Admitting: Family Medicine

## 2018-02-13 ENCOUNTER — Ambulatory Visit (INDEPENDENT_AMBULATORY_CARE_PROVIDER_SITE_OTHER): Payer: Medicare Other | Admitting: Family Medicine

## 2018-02-13 VITALS — BP 120/70 | HR 81 | Ht 64.0 in | Wt 169.0 lb

## 2018-02-13 DIAGNOSIS — M9902 Segmental and somatic dysfunction of thoracic region: Secondary | ICD-10-CM

## 2018-02-13 DIAGNOSIS — G44209 Tension-type headache, unspecified, not intractable: Secondary | ICD-10-CM | POA: Diagnosis not present

## 2018-02-13 DIAGNOSIS — M542 Cervicalgia: Secondary | ICD-10-CM

## 2018-02-13 DIAGNOSIS — M9903 Segmental and somatic dysfunction of lumbar region: Secondary | ICD-10-CM

## 2018-02-13 DIAGNOSIS — M9983 Other biomechanical lesions of lumbar region: Secondary | ICD-10-CM

## 2018-02-13 DIAGNOSIS — M9901 Segmental and somatic dysfunction of cervical region: Secondary | ICD-10-CM | POA: Diagnosis not present

## 2018-02-13 DIAGNOSIS — M9982 Other biomechanical lesions of thoracic region: Secondary | ICD-10-CM

## 2018-02-13 DIAGNOSIS — M999 Biomechanical lesion, unspecified: Secondary | ICD-10-CM | POA: Insufficient documentation

## 2018-02-13 DIAGNOSIS — M9981 Other biomechanical lesions of cervical region: Secondary | ICD-10-CM | POA: Diagnosis not present

## 2018-02-13 DIAGNOSIS — M9904 Segmental and somatic dysfunction of sacral region: Secondary | ICD-10-CM

## 2018-02-13 DIAGNOSIS — G935 Compression of brain: Secondary | ICD-10-CM

## 2018-02-13 MED ORDER — VITAMIN D (ERGOCALCIFEROL) 1.25 MG (50000 UNIT) PO CAPS: 50000.0000 [IU] | ORAL_CAPSULE | ORAL | Status: DC

## 2018-02-13 NOTE — Assessment & Plan Note (Signed)
Decision today to treat with OMT was based on Physical Exam  After verbal consent patient was treated with HVLA, ME, FPR techniques in cervical, thoracic, lumbar and sacral areas  Patient tolerated the procedure well with improvement in symptoms  Patient given exercises, stretches and lifestyle modifications  See medications in patient instructions if given  Patient will follow up in 4 weeks 

## 2018-02-13 NOTE — Assessment & Plan Note (Signed)
Chiari malformation noted on MRI today.  Could be consistent with some of her symptoms.  Attempted manipulation.

## 2018-02-13 NOTE — Assessment & Plan Note (Signed)
I do believe a significant amount of this muscle tension headache.  Responded well to osteopathic manipulation.  We discussed icing regimen.  Home exercises.  We will continue to monitor.  Patient will continue with the same medications and encouraged to take vitamin D supplementation.  Patient will follow up with me again in 3-4 weeks

## 2018-02-13 NOTE — Patient Instructions (Signed)
Good to see you  Exercises 3 times a week.  Once weekly vitmain D for 12 weeks.  You did great with manipulation  Over the counter get  Turmeric 500mg  daily  CoQ10 400mg  daily  We will send you to neurosurgey just to discuss the chiari malformation but I am pretty sure we can watch it I think you will do great  See me again in 3-4 weeks

## 2018-02-14 ENCOUNTER — Telehealth: Payer: Self-pay | Admitting: Neurology

## 2018-02-14 ENCOUNTER — Encounter: Payer: Self-pay | Admitting: Psychology

## 2018-02-14 DIAGNOSIS — R413 Other amnesia: Secondary | ICD-10-CM

## 2018-02-14 NOTE — Telephone Encounter (Signed)
Error

## 2018-02-14 NOTE — Telephone Encounter (Signed)
Spoke with patient about results:   ----- Message from Drema DallasAdam R Jaffe, DO sent at 02/12/2018  7:17 AM EST ----- MRI of brain does show a Chiari malformation which means that the cerebellum is slightly descended.  I think it is an incidental finding.  Some people have it and it doesn't cause any symptoms. There are also some little spots on the brain which are nonspecific and also not likely of any clinical significance.  Again, sometimes we see this.  In other words, I don't see anything concerning.   She is also very concerned about her memory issues. She states she will be having conversations and forget what she is talking about. This happens daily. Please advise.

## 2018-02-14 NOTE — Telephone Encounter (Signed)
Patient agreeable. Order placed.   Note sent to front desk to schedule.

## 2018-02-14 NOTE — Telephone Encounter (Signed)
We can refer her for neurocognitive testing with Dr. Daneen SchickBailer, but it may take 5 months to get an appointment.

## 2018-02-14 NOTE — Telephone Encounter (Signed)
Patient returned call back to the office regarding results. Please Call. Thanks

## 2018-02-28 ENCOUNTER — Other Ambulatory Visit: Payer: Self-pay | Admitting: Neurosurgery

## 2018-02-28 DIAGNOSIS — G935 Compression of brain: Secondary | ICD-10-CM

## 2018-03-03 ENCOUNTER — Ambulatory Visit
Admission: RE | Admit: 2018-03-03 | Discharge: 2018-03-03 | Disposition: A | Discharge status: Home / Self Care | Payer: Medicare Other | Source: Ambulatory Visit | Attending: Neurosurgery | Admitting: Neurosurgery

## 2018-03-03 DIAGNOSIS — Q0700 Arnold-Chiari syndrome without spina bifida or hydrocephalus: Secondary | ICD-10-CM | POA: Diagnosis not present

## 2018-03-03 DIAGNOSIS — G935 Compression of brain: Secondary | ICD-10-CM

## 2018-03-04 DIAGNOSIS — G245 Blepharospasm: Secondary | ICD-10-CM | POA: Diagnosis not present

## 2018-03-05 DIAGNOSIS — G935 Compression of brain: Secondary | ICD-10-CM | POA: Diagnosis not present

## 2018-03-08 ENCOUNTER — Other Ambulatory Visit: Payer: Self-pay

## 2018-03-08 DIAGNOSIS — H53413 Scotoma involving central area, bilateral: Secondary | ICD-10-CM | POA: Diagnosis not present

## 2018-03-08 MED ORDER — NORTRIPTYLINE HCL 10 MG PO CAPS: 10.0000 mg | ORAL_CAPSULE | Freq: Every day | ORAL | 1 refills | Status: DC

## 2018-03-11 NOTE — Progress Notes (Signed)
Tawana ScaleZach Raymir Frommelt D.O. [facility] [address] [place], [place] [address] Phone: [phone] Subjective:    I'm seeing this patient by the request  of:    CC: Headache and neck pain follow-up  OZD:[id number]:Subjective  Mary [name] is a 32 y.o. female coming in with complaint of headache and neck pain follow-up.  Patient was found to have a Chiari I malformation and was sent to neurosurgery for further evaluation.  Had a CT MRI of the cervical spine done.  Independently visualized by me showing no cervical syrinx that could be contributing to the headaches.  Otherwise no significant bony abnormality.  Patient states that the osteopathic manipulation, some of the vitamins and the posture exercises have been helpful.  Still has some discomfort.  Still has tightness of the left side of the neck more than the right.  No radiation down the fingertips at this time.     Past Medical History:  Diagnosis Date  . Asthma   . Bipolar 1 disorder (HCC)   . Bronchitis 01/31/2016  . Depression   . Headache(784.0)   . Mental disorder   . Post traumatic stress disorder (PTSD)   . Pregnant 01/21/2016  . Sinus infection 01/31/2016  . Supervision of normal pregnancy, antepartum 02/08/2016    [facility] at             13+1 week FOB   Fabienne BrunsMichael Pickelsimer 32 yo WM Dating By  LMP and US Pap  02/08/16 GC/CT Initial:                36+wks: Genetic Screen NT/IT:  CF screen  Anatomic US  Flu vaccine  Tdap Recommended ~ 28wks Glucose Screen  2 hr GBS  Feed Preference  Contraception  Circumcision  Childbirth Classes  Pediatrician     Past Surgical History:  Procedure Laterality Date  . colposcopy    . DILATION AND CURETTAGE OF UTERUS    . nexplanon    . WISDOM TOOTH EXTRACTION     Social History   Socioeconomic History  . Marital status: Legally Separated    Spouse name: Not on file  . Number of children: 4  . Years of education: Not on file  . Highest education level: GED or equivalent    Occupational History  . Occupation: disabled    Comment: PTSD  Social Needs  . Financial resource strain: Not on file  . Food insecurity:    Worry: Not on file    Inability: Not on file  . Transportation needs:    Medical: Not on file    Non-medical: Not on file  Tobacco Use  . Smoking status: Current Some Day Smoker    Packs/day: 0.50    Types: Cigarettes  . Smokeless tobacco: Never Used  . Tobacco comment: NEVER USE SNUFF OR CHEWING TOBACCO  Substance and Sexual Activity  . Alcohol use: No  . Drug use: No  . Sexual activity: Yes    Birth control/protection: None, Condom  Lifestyle  . Physical activity:    Days per week: Not on file    Minutes per session: Not on file  . Stress: Not on file  Relationships  . Social connections:    Talks on phone: Not on file    Gets together: Not on file    Attends religious service: Not on file    Active member of club or organization: Not on file    Attends meetings of clubs or  organizations: Not on file    Relationship status: Not on file  Other Topics Concern  . Not on file  Social History Narrative   Patient is right-handed. She is separated, has 4 sons. Is on disabilty for PTSD. She has one caffeine drink daily, walks 3 x week.   Allergies  Allergen Reactions  . Codeine Shortness Of Breath    Unknown Has taken vicodin and percocet without problem per pt  . Peanut-Containing Drug Products Shortness Of Breath  . Sulfa Antibiotics Anaphylaxis and Itching   Family History  Problem Relation Age of Onset  . Cancer Mother 57       breast  . Hypertension Mother   . Depression Mother   . Depression Father   . Seizures Father   . Cancer Maternal Grandmother        breast and cervical  . Asthma Son   . Asthma Son   . Hypertension Other   . Cancer Other      Past medical history, social, surgical and family history all reviewed in electronic medical record.  No pertanent information unless stated regarding to the chief  complaint.   Review of Systems:Review of systems updated and as accurate as of 03/12/18  No visual changes, nausea, vomiting, diarrhea, constipation, dizziness, abdominal pain, skin rash, fevers, chills, night sweats, weight loss, swollen lymph nodes, body aches, joint swelling, chest pain, shortness of breath, mood changes.  Positive headaches, muscle aches  Objective  Blood pressure 100/72, pulse 100, height 5\' 4"  (1.626 m), weight 168 lb (76.2 kg), SpO2 96 %, not currently breastfeeding. Systems examined below as of 03/12/18   General: No apparent distress alert and oriented x3 mood and affect normal, dressed appropriately.  HEENT: Pupils equal, extraocular movements intact  Respiratory: Patient's speak in full sentences and does not appear short of breath  Cardiovascular: No lower extremity edema, non tender, no erythema  Skin: Warm dry intact with no signs of infection or rash on extremities or on axial skeleton.  Abdomen: Soft nontender  Neuro: Cranial nerves II through XII are intact, neurovascularly intact in all extremities with 2+ DTRs and 2+ pulses.  Lymph: No lymphadenopathy of posterior or anterior cervical chain or axillae bilaterally.  Gait normal with good balance and coordination.  MSK:  Non tender with full range of motion and good stability and symmetric strength and tone of shoulders, elbows, wrist, hip, knee and ankles bilaterally.  Neck: Inspection loss of lordosis with mild increase in kyphosis of the upper thoracic. No palpable stepoffs. Negative Spurling's maneuver. Mild limitation in all planes Grip strength and sensation normal in bilateral hands Strength good C4 to T1 distribution No sensory change to C4 to T1 Negative Hoffman sign bilaterally Reflexes normal Fullness and tightness of the left trapezius  Osteopathic findings C4 flexed rotated and side bent left C6 flexed rotated and side bent left T2 extended rotated and side bent left inhaled rib T9  extended rotated and side bent left L2 flexed rotated and side bent right Sacrum right on right    Impression and Recommendations:     This case required medical decision making of moderate complexity.      Note: This dictation was prepared with Dragon dictation along with smaller phrase technology. Any transcriptional errors that result from this process are unintentional.

## 2018-03-12 ENCOUNTER — Encounter: Payer: Self-pay | Admitting: Family Medicine

## 2018-03-12 ENCOUNTER — Ambulatory Visit (INDEPENDENT_AMBULATORY_CARE_PROVIDER_SITE_OTHER): Payer: Medicare Other | Admitting: Family Medicine

## 2018-03-12 VITALS — BP 100/72 | HR 100 | Ht 64.0 in | Wt 168.0 lb

## 2018-03-12 DIAGNOSIS — M9902 Segmental and somatic dysfunction of thoracic region: Secondary | ICD-10-CM | POA: Diagnosis not present

## 2018-03-12 DIAGNOSIS — R51 Headache: Secondary | ICD-10-CM | POA: Diagnosis not present

## 2018-03-12 DIAGNOSIS — M9901 Segmental and somatic dysfunction of cervical region: Secondary | ICD-10-CM | POA: Diagnosis not present

## 2018-03-12 DIAGNOSIS — M999 Biomechanical lesion, unspecified: Secondary | ICD-10-CM

## 2018-03-12 DIAGNOSIS — M9904 Segmental and somatic dysfunction of sacral region: Secondary | ICD-10-CM | POA: Diagnosis not present

## 2018-03-12 DIAGNOSIS — M9903 Segmental and somatic dysfunction of lumbar region: Secondary | ICD-10-CM

## 2018-03-12 DIAGNOSIS — R519 Headache, unspecified: Secondary | ICD-10-CM
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
## 2018-03-12 NOTE — Assessment & Plan Note (Signed)
Decision today to treat with OMT was based on Physical Exam  After verbal consent patient was treated with  ME, FPR techniques in cervical, thoracic, lumbar and sacral areas  Patient tolerated the procedure well with improvement in symptoms  Patient given exercises, stretches and lifestyle modifications  See medications in patient instructions if given  Patient will follow up in 4-8  weeks

## 2018-03-12 NOTE — Patient Instructions (Addendum)
Good to see you  Mary [name] is your friend.  You are doing great   Tried light manipulation again  Continue what you are doing  See me again in 4-6 weeks

## 2018-03-14 DIAGNOSIS — G935 Compression of brain: Secondary | ICD-10-CM | POA: Diagnosis not present

## 2018-03-26 ENCOUNTER — Encounter: Payer: Self-pay | Admitting: Psychology

## 2018-03-26 ENCOUNTER — Ambulatory Visit (INDEPENDENT_AMBULATORY_CARE_PROVIDER_SITE_OTHER): Payer: Medicare Other | Admitting: Psychology

## 2018-03-26 DIAGNOSIS — R413 Other amnesia: Secondary | ICD-10-CM

## 2018-03-26 DIAGNOSIS — S060X1S Concussion with loss of consciousness of 30 minutes or less, sequela: Secondary | ICD-10-CM

## 2018-03-26 DIAGNOSIS — F4310 Post-traumatic stress disorder, unspecified: Secondary | ICD-10-CM

## 2018-03-26 DIAGNOSIS — G935 Compression of brain: Secondary | ICD-10-CM

## 2018-03-26 NOTE — Addendum Note (Signed)
Addended by: Othelia PullingBAILAR-HEATH, MARY B on: 03/26/2018 02:44 PM   Modules accepted: Orders
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
## 2018-04-01 NOTE — Progress Notes (Signed)
Rcvd fax from [facility]. Pt has been scheduled with Dr. Dewayne HatchMendelson on 05/20/18 @ 10a. Called Pt, LMOVM advising of appt date and time.

## 2018-04-09 ENCOUNTER — Ambulatory Visit: Payer: Medicare Other | Admitting: Family Medicine

## 2018-04-11 ENCOUNTER — Encounter: Payer: Self-pay | Admitting: Psychology

## 2018-04-15 DIAGNOSIS — H04123 Dry eye syndrome of bilateral lacrimal glands: Secondary | ICD-10-CM | POA: Diagnosis not present

## 2018-04-25 DIAGNOSIS — G245 Blepharospasm: Secondary | ICD-10-CM | POA: Diagnosis not present

## 2018-05-01 ENCOUNTER — Encounter: Payer: Self-pay | Admitting: Family Medicine

## 2018-05-01 ENCOUNTER — Ambulatory Visit (INDEPENDENT_AMBULATORY_CARE_PROVIDER_SITE_OTHER): Payer: Medicare Other | Admitting: Family Medicine

## 2018-05-01 VITALS — BP 100/74 | HR 90 | Ht 64.0 in | Wt 172.0 lb

## 2018-05-01 DIAGNOSIS — M999 Biomechanical lesion, unspecified: Secondary | ICD-10-CM | POA: Diagnosis not present

## 2018-05-01 DIAGNOSIS — M9902 Segmental and somatic dysfunction of thoracic region: Secondary | ICD-10-CM

## 2018-05-01 DIAGNOSIS — G44201 Tension-type headache, unspecified, intractable: Secondary | ICD-10-CM | POA: Diagnosis not present

## 2018-05-01 DIAGNOSIS — M542 Cervicalgia: Secondary | ICD-10-CM | POA: Diagnosis not present

## 2018-05-01 DIAGNOSIS — M9904 Segmental and somatic dysfunction of sacral region: Secondary | ICD-10-CM | POA: Diagnosis not present

## 2018-05-01 DIAGNOSIS — M9901 Segmental and somatic dysfunction of cervical region: Secondary | ICD-10-CM | POA: Diagnosis not present

## 2018-05-01 DIAGNOSIS — M9903 Segmental and somatic dysfunction of lumbar region: Secondary | ICD-10-CM | POA: Diagnosis not present

## 2018-05-01 DIAGNOSIS — M9982 Other biomechanical lesions of thoracic region: Secondary | ICD-10-CM | POA: Diagnosis not present

## 2018-05-01 MED ORDER — VENLAFAXINE HCL ER 37.5 MG PO CP24: 37.5000 mg | ORAL_CAPSULE | Freq: Every day | ORAL | 1 refills | Status: DC

## 2018-05-01 NOTE — Patient Instructions (Addendum)
Good to see you  Mary [name] is your friend.  effexor 37.5 mg daily   Keep up the exercises  PT will be calling you See me again in 4 weeks

## 2018-05-01 NOTE — Assessment & Plan Note (Signed)
Decision today to treat with OMT was based on Physical Exam  After verbal consent patient was treated with HVLA, ME, FPR techniques in cervical, thoracic, lumbar and sacral areas  Patient tolerated the procedure well with improvement in symptoms  Patient given exercises, stretches and lifestyle modifications  See medications in patient instructions if given  Patient will follow up in 4 weeks 

## 2018-05-01 NOTE — Progress Notes (Signed)
[name] [address] [place], [place] [address] Phone: [phone] Subjective:      CC: Headache and neck pain follow-up  [id number]  Mary [name] is a 32 y.o. female coming in with complaint of  Headaches as well as neck pain.  Has had this for quite some time.  Patient states that after manipulation she she usually feels better for a week or 2 and then seems to be increasing again.  Patient states that unfortunately does not feel that any of the medication she has been on recently has been helping so she has discontinued most of them.     Past Medical History:  Diagnosis Date  . Asthma   . Bipolar 1 disorder (HCC)   . Bronchitis 01/31/2016  . Depression   . Headache(784.0)   . Mental disorder   . Post traumatic stress disorder (PTSD)   . Pregnant 01/21/2016  . Sinus infection 01/31/2016  . Supervision of normal pregnancy, antepartum 02/08/2016    [facility] at             13+1 week FOB   Mary [name] 32 yo WM Dating By  LMP and [place] Pap  02/08/16 GC/CT Initial:                36+wks: Genetic Screen NT/IT:  CF screen  Anatomic [place]  Flu vaccine  Tdap Recommended ~ 28wks Glucose Screen  2 hr GBS  Feed Preference  Contraception  Circumcision  Childbirth Classes  Pediatrician     Past Surgical History:  Procedure Laterality Date  . colposcopy    . DILATION AND CURETTAGE OF UTERUS    . nexplanon    . WISDOM TOOTH EXTRACTION     Social History   Socioeconomic History  . Marital status: Legally Separated    Spouse name: Not on file  . Number of children: 4  . Years of education: Not on file  . Highest education level: GED or equivalent  Occupational History  . Occupation: disabled    Comment: PTSD  Social Needs  . Financial resource strain: Not on file  . Food insecurity:    Worry: Not on file    Inability: Not on file  . Transportation needs:    Medical: Not on file    Non-medical: Not on file  Tobacco Use    . Smoking status: Current Some Day Smoker    Packs/day: 0.50    Types: Cigarettes  . Smokeless tobacco: Never Used  . Tobacco comment: NEVER USE SNUFF OR CHEWING TOBACCO  Substance and Sexual Activity  . Alcohol use: No  . Drug use: No  . Sexual activity: Yes    Birth control/protection: None, Condom  Lifestyle  . Physical activity:    Days per week: Not on file    Minutes per session: Not on file  . Stress: Not on file  Relationships  . Social connections:    Talks on phone: Not on file    Gets together: Not on file    Attends religious service: Not on file    Active member of club or organization: Not on file    Attends meetings of clubs or organizations: Not on file    Relationship status: Not on file  Other Topics Concern  . Not on file  Social History Narrative   Patient is right-handed. She is separated, has 4 sons. Is on disabilty for PTSD. She has one caffeine drink  daily, walks 3 x week.   Allergies  Allergen Reactions  . Codeine Shortness Of Breath    Unknown Has taken vicodin and percocet without problem per pt  . Peanut-Containing Drug Products Shortness Of Breath  . Sulfa Antibiotics Anaphylaxis and Itching   Family History  Problem Relation Age of Onset  . Cancer Mother 44       breast  . Hypertension Mother   . Depression Mother   . Depression Father   . Seizures Father   . Cancer Maternal Grandmother        breast and cervical  . Asthma Son   . Asthma Son   . Hypertension Other   . Cancer Other      Past medical history, social, surgical and family history all reviewed in electronic medical record.  No pertanent information unless stated regarding to the chief complaint.   Review of Systems:Review of systems updated and as accurate as of 05/01/18  No , visual changes, nausea, vomiting, diarrhea, constipation, dizziness, abdominal pain, skin rash, fevers, chills, night sweats, weight loss, swollen lymph nodes, chest pain, shortness of breath,  mood changes.  Positive headaches, muscle aches, joint pain and body aches  Objective  Blood pressure 100/74, pulse 90, height  (1.626 m), weight 172 lb (78 kg), SpO2 98 %, not currently breastfeeding. Systems examined below as of 05/01/18   General: No apparent distress alert and oriented x3 mood and affect normal, dressed appropriately.  HEENT: Pupils equal, extraocular movements intact  Respiratory: Patient's speak in full sentences and does not appear short of breath  Cardiovascular: No lower extremity edema, non tender, no erythema  Skin: Warm dry intact with no signs of infection or rash on extremities or on axial skeleton.  Abdomen: Soft nontender  Neuro: Cranial nerves II through XII are intact, neurovascularly intact in all extremities with 2+ DTRs and 2+ pulses.  Lymph: No lymphadenopathy of posterior or anterior cervical chain or axillae bilaterally.  Gait normal with good balance and coordination.  MSK:  Non tender with full range of motion and good stability and symmetric strength and tone of shoulders, elbows, wrist, hip, knee and ankles bilaterally.  Neck: Inspection mild loss of lordosis. No palpable stepoffs. Negative Spurling's maneuver. Mild to moderate loss of range of motion in all planes of the neck by 5 degrees Grip strength and sensation normal in bilateral hands Strength good C4 to T1 distribution No sensory change to C4 to T1 Negative Hoffman sign bilaterally Reflexes normal Tightness of the trapezius bilaterally   Osteopathic findings C2 flexed rotated and side bent right C5 flexed rotated and side bent left T3 extended rotated and side bent right inhaled third rib T9 extended rotated and side bent left L2 flexed rotated and side bent right Sacrum right on right     Impression and Recommendations:     This case required medical decision making of moderate complexity.      Note: This dictation was prepared with Dragon dictation along with  smaller phrase technology. Any transcriptional errors that result from this process are unintentional.

## 2018-05-01 NOTE — Assessment & Plan Note (Signed)
Multifactorial.  I do believe cervicogenic headaches.  We discussed posture and ergonomics again.  Started the Effexor to see if this will be beneficial.  I do think that there is some underlying anxiety and depression that could be contributing as well.  Discussed icing regimen, home exercise, which activities of doing which wants to avoid.  Patient will follow-up again in 4 to 8 weeks

## 2018-05-08 ENCOUNTER — Ambulatory Visit: Payer: Self-pay | Admitting: Neurology

## 2018-05-15 ENCOUNTER — Ambulatory Visit (HOSPITAL_COMMUNITY): Payer: Medicare Other | Attending: Family Medicine

## 2018-05-15 ENCOUNTER — Encounter (HOSPITAL_COMMUNITY): Payer: Self-pay

## 2018-05-15 DIAGNOSIS — M6281 Muscle weakness (generalized): Secondary | ICD-10-CM | POA: Diagnosis not present

## 2018-05-15 DIAGNOSIS — R293 Abnormal posture: Secondary | ICD-10-CM | POA: Diagnosis not present

## 2018-05-15 DIAGNOSIS — M542 Cervicalgia: Secondary | ICD-10-CM | POA: Diagnosis not present
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
## 2018-05-16 DIAGNOSIS — G935 Compression of brain: Secondary | ICD-10-CM | POA: Diagnosis not present

## 2018-05-17 ENCOUNTER — Ambulatory Visit (HOSPITAL_COMMUNITY): Payer: Medicare Other

## 2018-05-17 DIAGNOSIS — G245 Blepharospasm: Secondary | ICD-10-CM | POA: Diagnosis not present

## 2018-05-20 ENCOUNTER — Ambulatory Visit (INDEPENDENT_AMBULATORY_CARE_PROVIDER_SITE_OTHER): Payer: Medicare Other | Admitting: Clinical

## 2018-05-20 DIAGNOSIS — F4310 Post-traumatic stress disorder, unspecified: Secondary | ICD-10-CM

## 2018-05-21 ENCOUNTER — Ambulatory Visit: Payer: Medicare Other | Admitting: Psychology

## 2018-05-21 ENCOUNTER — Encounter: Payer: Self-pay | Admitting: Psychology

## 2018-05-21 DIAGNOSIS — R413 Other amnesia: Secondary | ICD-10-CM

## 2018-05-21 NOTE — Progress Notes (Signed)
   Neuropsychology Note  Mary Corwinshley W Reitan completed 150 minutes of neuropsychological testing with technician, Wallace Kellerana Cleda Imel, BS, under the supervision of Dr. Elvis CoilMaryBeth Bailar, Licensed Psychologist. The patient did not appear overtly distressed by the testing session, per behavioral observation or via self-report to the technician. Rest breaks were offered.   Clinical Decision Making: In considering the patient's current level of functioning, level of presumed impairment, nature of symptoms, emotional and behavioral responses during the interview, level of literacy, and observed level of motivation/effort, a battery of tests was selected and communicated to the psychometrician.  Communication between the psychologist and technician was ongoing throughout the testing session and changes were made as deemed necessary based on patient performance on testing, technician observations and additional pertinent factors such as those listed above.  Mary [name] will return within approximately 2 weeks for an interactive feedback session with Dr. Alinda DoomsBailar at which time her test performances, clinical impressions and treatment recommendations will be reviewed in detail. The patient understands she can contact our office should she require our assistance before this time.  35 minutes spent performing neuropsychological evaluation services/clinical decision making (psychologist). [CPT 96132] 150 minutes spent face-to-face with patient administering standardized tests, 60 minutes spent scoring (technician). [CPT [id number], 96139]  Full report to follow.

## 2018-05-22 ENCOUNTER — Ambulatory Visit (HOSPITAL_COMMUNITY): Payer: Medicare Other

## 2018-05-22 DIAGNOSIS — M6281 Muscle weakness (generalized): Secondary | ICD-10-CM | POA: Diagnosis not present

## 2018-05-22 DIAGNOSIS — M542 Cervicalgia: Secondary | ICD-10-CM

## 2018-05-22 DIAGNOSIS — R293 Abnormal posture: Secondary | ICD-10-CM

## 2018-05-22 NOTE — Therapy (Signed)
[facility] [address] [place], [place], [address] Phone: [phone]   Fax:  [phone]  Physical Therapy Treatment  Patient Details  Name: Mary [name] MRN: [id number] Date of Birth: [birth year] Referring Provider: Antoine Primas, DO   Encounter Date: 05/22/2018  PT End of Session - 05/22/18 1121    Visit Number  2    Number of Visits  9    Date for PT Re-Evaluation  06/12/18    Authorization Type  [employer] Medicare (Secondary: Medicaid)    Authorization Time Period  05/15/18 to 06/12/18    PT Start Time  1118    PT Stop Time  1202    PT Time Calculation (min)  44 min    Activity Tolerance  Patient tolerated treatment well;No increased pain    Behavior During Therapy  WFL for tasks assessed/performed       Past Medical History:  Diagnosis Date  . Asthma   . Bipolar 1 disorder (HCC)   . Bronchitis 01/31/2016  . Depression   . Headache(784.0)   . Mental disorder   . Post traumatic stress disorder (PTSD)   . Pregnant 01/21/2016  . Sinus infection 01/31/2016  . Supervision of normal pregnancy, antepartum 02/08/2016    [facility] at             13+1 week FOB   Mary [name] 33 yo WM Dating By  LMP and [place] Pap  02/08/16 GC/CT Initial:                36+wks: Genetic Screen NT/IT:  CF screen  Anatomic [place]  Flu vaccine  Tdap Recommended ~ 28wks Glucose Screen  2 hr GBS  Feed Preference  Contraception  Circumcision  Childbirth Classes  Pediatrician      Past Surgical History:  Procedure Laterality Date  . colposcopy    . DILATION AND CURETTAGE OF UTERUS    . nexplanon    . WISDOM TOOTH EXTRACTION      There were no vitals filed for this visit.  Subjective Assessment - 05/22/18 1121    Subjective  Pt reports that her neck is feeling good. She is not currently having any pain and she reports that the cervical retractions make her neck feel so much better, especially when she is laying down.    Pertinent History   Chiari 1 Malformation    How long can you sit comfortably?  couple mins, has to change positions a lot    How long can you stand comfortably?  few mins, has to move a lot    How long can you walk comfortably?  30 mins    Patient Stated Goals  get better, pain decrease    Currently in Pain?  No/denies    Pain Onset  More than a month ago            [facility] PT Assessment - 05/22/18 0001      Observation/Other Assessments   Other Surveys   Other Surveys    Neck Disability Index   30/50      Sensation   Additional Comments  Cranial Nerve Testing: all WNL            [facility] Adult PT Treatment/Exercise - 05/22/18 0001      Exercises   Exercises  Neck      Neck Exercises: Theraband   Scapula Retraction  10 reps;Red    Scapula Retraction Limitations  2 sets, +  cervical retraction    Shoulder Extension  10 reps;Red    Shoulder Extension Limitations  2 sets, +cervical retraction      Neck Exercises: Seated   Neck Retraction  10 reps;5 secs    Other Seated Exercise  cervical excursions x10 reps each with cervical retraction      Manual Therapy   Manual Therapy  Soft tissue mobilization    Manual therapy comments  completed separate rest of treatment    Soft tissue mobilization  STM R upper trap and levator scap to address soft tissue restritions and decrease pain      Neck Exercises: Stretches   Upper Trapezius Stretch  Right;Left;2 reps;30 seconds    Levator Stretch  Right;Left;2 reps;30 seconds             PT Short Term Goals - 05/22/18 1207      PT SHORT TERM GOAL #1   Title  Pt will be independent with HEP and perform consistently in order to decrease pain and improve function.    Time  2    Period  Weeks    Status  On-going      PT SHORT TERM GOAL #2   Title  Pt will have imrpoved cervical AROM by 5 deg for flexion, ext, and bil rotation in order to decrease pain and improve function.    Time  2    Period  Weeks    Status  On-going      PT SHORT TERM  GOAL #3   Title  Pt will be able to perform deep neck flexor endurance test for 15 sec with proper form and without pain to demo improved functional strength of cervical musculature.    Time  2    Period  Weeks    Status  On-going      PT SHORT TERM GOAL #4   Title  Pt will score 14/50 or < on the NDI to demo decreased self-perceived disability due to her neck and maximzie her overall return to PLOF.     Time  2    Period  Weeks    Status  On-going        PT Long Term Goals - 05/22/18 1207      PT LONG TERM GOAL #1   Title  Pt will have improved cervical AROM by 10 deg with flexion, extension, and bil rotation to furhter decrease pain and maximize her driving ability.     Time  4    Period  Weeks    Status  On-going      PT LONG TERM GOAL #2   Title  Pt will be able to perform deep neck flexor endurnace test for 20 sec with proper form and without pain to demo further improved cervical musculature strength and decrease her pain.    Time  4    Period  Weeks    Status  On-going      PT LONG TERM GOAL #3   Title  Pt will have improved cervical MMT to 5/5 and without pain to demo improved overall function and allow her to perform household chores wtih greater ease.     Time  4    Period  Weeks    Status  On-going      PT LONG TERM GOAL #4   Title  Pt will reported decreased overall pain to 3/10 or < on a daily basis to allow her to perform ADLs and IADLs with greater  ease.    Time  4    Period  Weeks    Status  On-going            Plan - 05/22/18 1206    Clinical Impression Statement  Began session by reviewing initial goals and issuing copy of eval with no f/u questions. Pt completed NDI and scored 30/50, indicating severe disability due to her neck pain. PT assessed pt's cranial nerves and she was WNL for all. Today's session focused on cervical mobility, postural strengthening, and addressing soft tissue restrictions. Pt demo'ing good carry-over for cervical  retractions provided as HEP at eval. Added upper trap and levator scap stretching and will add to HEP next visit if pt had positive response from it today. Initiated postural strengthening and STM for soft tissue restrictions in upper trap and levator scap. Pt responded very well to the [facility] and reported feeling much looser afterwards. Continue POC as planned, progressing as able and appropriate.    Rehab Potential  Fair    PT Frequency  2x / week    PT Duration  4 weeks    PT Treatment/Interventions  ADLs/Self Care Home Management;Cryotherapy;Electrical Stimulation;Moist Heat;Ultrasound;Functional mobility training;Therapeutic activities;Therapeutic exercise;Balance training;Neuromuscular re-education;Patient/family education;Manual techniques;Passive range of motion;Dry needling;Energy conservation;Taping    PT Next Visit Plan  continue manual STM for pain control and soft tissue restrictions, postural strengthening and education, progressing as tolerated; address L upper trap during manual as needed and add upper trap and levator scap stretching to HEP if tolerated well    PT Home Exercise Plan  eval: supine cervical retractions    Consulted and Agree with Plan of Care  Patient       Patient will benefit from skilled therapeutic intervention in order to improve the following deficits and impairments:  Decreased activity tolerance, Decreased endurance, Decreased range of motion, Decreased strength, Hypermobility, Hypomobility, Increased muscle spasms, Impaired flexibility, Improper body mechanics, Postural dysfunction, Impaired UE functional use, Pain  Visit Diagnosis: Cervicalgia  Muscle weakness (generalized)  Abnormal posture     Problem List Patient Active Problem List   Diagnosis Date Noted  . Chiari I malformation (HCC) 02/13/2018  . Nonallopathic lesion of cervical region 02/13/2018  . Nonallopathic lesion of thoracic region 02/13/2018  . Nonallopathic lesion of lumbosacral  region 02/13/2018  . Current smoker 04/20/2017  . Headache 04/20/2017  . Encounter for initial prescription of contraceptive pills 02/21/2017  . Muscle tension headache 02/21/2017  . Postpartum depression 10/10/2016  . Rubella non-immune status, antepartum 07/04/2016  . Susceptible to varicella (non-immune), currently pregnant 07/04/2016  . Abnormal Pap smear of cervix 07/22/2014  . HPV in female 07/22/2014  . Mild dysplasia of cervix 07/22/2014  . Bipolar disorder (HCC) 06/04/2013  . Asthma 06/04/2013  . Depression 06/04/2013       Jac Canavan PT, DPT  [facility] [address] [place], [place], [address] Phone: [phone]   Fax:  [phone]  Name: Mary [name] MRN: [id number] Date of Birth: Nov 05, 1986

## 2018-05-24 ENCOUNTER — Ambulatory Visit (HOSPITAL_COMMUNITY): Payer: Medicare Other

## 2018-05-24 ENCOUNTER — Encounter (HOSPITAL_COMMUNITY): Payer: Self-pay

## 2018-05-24 ENCOUNTER — Other Ambulatory Visit: Payer: Self-pay | Admitting: Family Medicine

## 2018-05-24 VITALS — BP 109/82 | HR 84

## 2018-05-24 DIAGNOSIS — R293 Abnormal posture: Secondary | ICD-10-CM | POA: Diagnosis not present

## 2018-05-24 DIAGNOSIS — M6281 Muscle weakness (generalized): Secondary | ICD-10-CM

## 2018-05-24 DIAGNOSIS — M542 Cervicalgia: Secondary | ICD-10-CM

## 2018-05-24 NOTE — Progress Notes (Signed)
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 started seeing a therapist there and reports really liking the sessions.  4. I discussed with the patient the impact of depression and PTSD on cognitive functioning.    Feedback to Patient: Mary [name] did not show up for her feedback appointment scheduled on 05/28/2018 as she forgot about the appointment until later in the day. However I was available to speak to her by phone and review the results of her neuropsychological evaluation with her. 15 minutes face-to-face time was spent reviewing her test results, my impressions and my recommendations as detailed above. I also mailed her written information.   Total time spent on this patient's case: 115 minutes for neurobehavioral status exam with psychologist (CPT code 1610996116, [phone]); 210 minutes of testing/scoring by psychometrician under psychologist's supervision (CPT codes [phone], [phone] units); 240 minutes for integration of patient data, interpretation of standardized test results and clinical data, clinical decision making, treatment planning and preparation of this report, and interactive feedback with review of results to the patient/family by  psychologist (CPT codes [id number], [phone] units).      Thank you for your referral of Mary Corwinshley W [name]. Please feel free to contact me if you have any questions or concerns regarding this report.

## 2018-05-24 NOTE — Therapy (Signed)
[facility] [address] [place], [place], [address] Phone: [phone]   Fax:  [phone]  Physical Therapy Treatment  Patient Details  Name: Mary [name] MRN: [id number] Date of Birth: [birth year] Referring Provider: Antoine Primas, DO   Encounter Date: 05/24/2018  PT End of Session - 05/24/18 1036    Visit Number  3    Number of Visits  9    Date for PT Re-Evaluation  06/12/18    Authorization Type  UHC Medicare (Secondary: Medicaid)    Authorization Time Period  05/15/18 to 06/12/18    PT Start Time  1031    PT Stop Time  1118    PT Time Calculation (min)  47 min    Activity Tolerance  Patient tolerated treatment well;No increased pain    Behavior During Therapy  WFL for tasks assessed/performed       Past Medical History:  Diagnosis Date  . Asthma   . Bipolar 1 disorder (HCC)   . Bronchitis 01/31/2016  . Depression   . Headache(784.0)   . Mental disorder   . Post traumatic stress disorder (PTSD)   . Pregnant 01/21/2016  . Sinus infection 01/31/2016  . Supervision of normal pregnancy, antepartum 02/08/2016    [facility] at             13+1 week FOB   Mary [name] 32 yo WM Dating By  LMP and [place] Pap  02/08/16 GC/CT Initial:                36+wks: Genetic Screen NT/IT:  CF screen  Anatomic [place]  Flu vaccine  Tdap Recommended ~ 28wks Glucose Screen  2 hr GBS  Feed Preference  Contraception  Circumcision  Childbirth Classes  Pediatrician      Past Surgical History:  Procedure Laterality Date  . colposcopy    . DILATION AND CURETTAGE OF UTERUS    . nexplanon    . WISDOM TOOTH EXTRACTION      Vitals:   05/24/18 1346  BP: 109/82  Pulse: 84    Subjective Assessment - 05/24/18 1032    Subjective  Pt stated her neck is feeling better.  Reports she feels great completeing the cervical retracitons exercises, stated her kids have been making fun of her completing the exercises regularly.    Patient Stated  Goals  get better, pain decrease    Currently in Pain?  No/denies                       [facility] Adult PT Treatment/Exercise - 05/24/18 0001      Posture/Postural Control   Posture/Postural Control  Postural limitations    Postural Limitations  Rounded Shoulders;Forward head      Exercises   Exercises  Neck      Neck Exercises: Theraband   Scapula Retraction  15 reps;Red    Scapula Retraction Limitations  with cervical retraction    Shoulder Extension  15 reps;Red    Shoulder Extension Limitations  with cervical retraction    Rows  15 reps;Red;Limitations    Rows Limitations  with cervical retraction    Other Theraband Exercises  cervical retraciton against wall with UE flexion 10x      Neck Exercises: Seated   Neck Retraction  15 reps;5 secs    W Back  10 reps    Money  10 reps      Manual Therapy  Manual Therapy  Soft tissue mobilization;Other (comment)    Manual therapy comments  completed separate rest of treatment    Soft tissue mobilization  STM R upper trap and levator scap to address soft tissue restritions and decrease pain    Other Manual Therapy  position release technique      Neck Exercises: Stretches   Upper Trapezius Stretch  Right;Left;2 reps;30 seconds    Levator Stretch  Right;Left;2 reps;30 seconds               PT Short Term Goals - 05/22/18 1207      PT SHORT TERM GOAL #1   Title  Pt will be independent with HEP and perform consistently in order to decrease pain and improve function.    Time  2    Period  Weeks    Status  On-going      PT SHORT TERM GOAL #2   Title  Pt will have imrpoved cervical AROM by 5 deg for flexion, ext, and bil rotation in order to decrease pain and improve function.    Time  2    Period  Weeks    Status  On-going      PT SHORT TERM GOAL #3   Title  Pt will be able to perform deep neck flexor endurance test for 15 sec with proper form and without pain to demo improved functional strength of  cervical musculature.    Time  2    Period  Weeks    Status  On-going      PT SHORT TERM GOAL #4   Title  Pt will score 14/50 or < on the NDI to demo decreased self-perceived disability due to her neck and maximzie her overall return to PLOF.     Time  2    Period  Weeks    Status  On-going        PT Long Term Goals - 05/22/18 1207      PT LONG TERM GOAL #1   Title  Pt will have improved cervical AROM by 10 deg with flexion, extension, and bil rotation to furhter decrease pain and maximize her driving ability.     Time  4    Period  Weeks    Status  On-going      PT LONG TERM GOAL #2   Title  Pt will be able to perform deep neck flexor endurnace test for 20 sec with proper form and without pain to demo further improved cervical musculature strength and decrease her pain.    Time  4    Period  Weeks    Status  On-going      PT LONG TERM GOAL #3   Title  Pt will have improved cervical MMT to 5/5 and without pain to demo improved overall function and allow her to perform household chores wtih greater ease.     Time  4    Period  Weeks    Status  On-going      PT LONG TERM GOAL #4   Title  Pt will reported decreased overall pain to 3/10 or < on a daily basis to allow her to perform ADLs and IADLs with greater ease.    Time  4    Period  Weeks    Status  On-going            Plan - 05/24/18 1229    Clinical Impression Statement  Session focus on postural strengthening and cervical  mobility.  Min verbal and tactile cueing to reduce thoracic extension with cervical retraction exercises, visual assistance the best feedback from mirror to improve awareness.  Added standing cervical retraction against wall to improve compensation with good form noted.  Pt able to demonstrate and verbalize appropriate form with UT and levator stretches, given additional HEP.  EOS with manual to address soft tissue restrictions in cervical region, added position release techniques to UT with  positive results following.  Pt did c/o dizziness following transition from supine -> sit--> standings, BP assessed at 109/82 mmHg and 84 bmp vitals.  Dizziness resolved shortly.      Rehab Potential  Fair    PT Frequency  2x / week    PT Duration  4 weeks    PT Treatment/Interventions  ADLs/Self Care Home Management;Cryotherapy;Electrical Stimulation;Moist Heat;Ultrasound;Functional mobility training;Therapeutic activities;Therapeutic exercise;Balance training;Neuromuscular re-education;Patient/family education;Manual techniques;Passive range of motion;Dry needling;Energy conservation;Taping    PT Next Visit Plan  continue manual STM for pain control and soft tissue restrictions, postural strengthening and education, progressing as tolerated; address L upper trap during manual as needed.    PT Home Exercise Plan  eval: supine cervical retractions; 6/14: UT and levator stretch       Patient will benefit from skilled therapeutic intervention in order to improve the following deficits and impairments:  Decreased activity tolerance, Decreased endurance, Decreased range of motion, Decreased strength, Hypermobility, Hypomobility, Increased muscle spasms, Impaired flexibility, Improper body mechanics, Postural dysfunction, Impaired UE functional use, Pain  Visit Diagnosis: Cervicalgia  Muscle weakness (generalized)  Abnormal posture     Problem List Patient Active Problem List   Diagnosis Date Noted  . Chiari I malformation (HCC) 02/13/2018  . Nonallopathic lesion of cervical region 02/13/2018  . Nonallopathic lesion of thoracic region 02/13/2018  . Nonallopathic lesion of lumbosacral region 02/13/2018  . Current smoker 04/20/2017  . Headache 04/20/2017  . Encounter for initial prescription of contraceptive pills 02/21/2017  . Muscle tension headache 02/21/2017  . Postpartum depression 10/10/2016  . Rubella non-immune status, antepartum 07/04/2016  . Susceptible to varicella  (non-immune), currently pregnant 07/04/2016  . Abnormal Pap smear of cervix 07/22/2014  . HPV in female 07/22/2014  . Mild dysplasia of cervix 07/22/2014  . Bipolar disorder (HCC) 06/04/2013  . Asthma 06/04/2013  . Depression 06/04/2013   Becky Saxasey Raechell Singleton, LPTA; CBIS [phone]  Juel BurrowCockerham, Trinidee Schrag Jo 05/24/2018, 1:48 PM  [facility] [address] [place], [place], [address] Phone: [phone]   Fax:  [phone]  Name: Mary [name] MRN: [id number] Date of Birth: [birth year]

## 2018-05-24 NOTE — Patient Instructions (Signed)
Flexibility: Upper Trapezius Stretch    Gently grasp right side of head while reaching behind back with other hand. Tilt head away until a gentle stretch is felt. Hold 30 seconds. Repeat 3 times per set. Do 2 sets per day.  http://orth.exer.us/341   Copyright  VHI. All rights reserved.   Levator Scapula Stretch, Sitting    Sit, one hand tucked under hip on side to be stretched, other hand over top of head. Turn head toward other side and look down. Use hand on head to gently stretch neck in that position. Hold 30 seconds. Repeat 3 times per session. Do 1-2 sessions per day.  Copyright  VHI. All rights reserved.

## 2018-05-24 NOTE — Telephone Encounter (Signed)
Refill done.  

## 2018-05-27 ENCOUNTER — Telehealth (HOSPITAL_COMMUNITY): Payer: Self-pay

## 2018-05-27 ENCOUNTER — Ambulatory Visit (HOSPITAL_COMMUNITY): Payer: Medicare Other

## 2018-05-27 NOTE — Telephone Encounter (Signed)
Patient cancel for today she don't have money for gas and may  have to cancel for friday she will let us know.

## 2018-05-28 ENCOUNTER — Encounter: Payer: Self-pay | Admitting: Psychology

## 2018-05-28 ENCOUNTER — Ambulatory Visit (INDEPENDENT_AMBULATORY_CARE_PROVIDER_SITE_OTHER): Payer: Medicare Other | Admitting: Psychology

## 2018-05-28 DIAGNOSIS — F4310 Post-traumatic stress disorder, unspecified: Secondary | ICD-10-CM

## 2018-05-28 DIAGNOSIS — F332 Major depressive disorder, recurrent severe without psychotic features: Secondary | ICD-10-CM

## 2018-05-28 DIAGNOSIS — R413 Other amnesia: Secondary | ICD-10-CM

## 2018-05-28 DIAGNOSIS — G935 Compression of brain: Secondary | ICD-10-CM

## 2018-05-28 DIAGNOSIS — S060X1S Concussion with loss of consciousness of 30 minutes or less, sequela: Secondary | ICD-10-CM

## 2018-05-28 DIAGNOSIS — F909 Attention-deficit hyperactivity disorder, unspecified type: Secondary | ICD-10-CM

## 2018-05-29 ENCOUNTER — Ambulatory Visit: Payer: Medicare Other | Admitting: Family Medicine

## 2018-05-31 ENCOUNTER — Encounter (HOSPITAL_COMMUNITY): Payer: Self-pay

## 2018-06-05 ENCOUNTER — Ambulatory Visit (HOSPITAL_COMMUNITY): Payer: Medicare Other

## 2018-06-05 DIAGNOSIS — M6281 Muscle weakness (generalized): Secondary | ICD-10-CM | POA: Diagnosis not present

## 2018-06-05 DIAGNOSIS — M542 Cervicalgia: Secondary | ICD-10-CM

## 2018-06-05 DIAGNOSIS — R293 Abnormal posture: Secondary | ICD-10-CM

## 2018-06-05 NOTE — Patient Instructions (Signed)
Access Code: [email or web address]  URL: [email or web address]  Date: 06/05/2018  Prepared by: Jac CanavanBrooke Shantera Monts   Exercises Scapular Retraction with Resistance - 10 reps - 3 sets - 1x daily - 7x weekly Shoulder Extension with Resistance - 10 reps - 3 sets - 1x daily - 7x weekly Standing High Row with Resistance - 10 reps - 3 sets - 1x daily - 7x weekly

## 2018-06-05 NOTE — Therapy (Signed)
[facility] [address] [place], [place], [address] Phone: [phone]   Fax:  [phone]  Physical Therapy Treatment  Patient Details  Name: Mary [name] MRN: [id number] Date of Birth: 10/02/1986 Referring Provider: Antoine Primas, DO   Encounter Date: 06/05/2018  PT End of Session - 06/05/18 1022    Visit Number  4    Number of Visits  9    Date for PT Re-Evaluation  06/12/18    Authorization Type  UHC Medicare (Secondary: Medicaid)    Authorization Time Period  05/15/18 to 06/12/18    PT Start Time  1024    PT Stop Time  1106    PT Time Calculation (min)  42 min    Activity Tolerance  Patient tolerated treatment well;No increased pain    Behavior During Therapy  WFL for tasks assessed/performed       Past Medical History:  Diagnosis Date  . Asthma   . Bipolar 1 disorder (HCC)   . Bronchitis 01/31/2016  . Depression   . Headache(784.0)   . Mental disorder   . Post traumatic stress disorder (PTSD)   . Pregnant 01/21/2016  . Sinus infection 01/31/2016  . Supervision of normal pregnancy, antepartum 02/08/2016    [facility] at             13+1 week FOB   Mary [name] 32 yo WM Dating By  LMP and [place] Pap  02/08/16 GC/CT Initial:                36+wks: Genetic Screen NT/IT:  CF screen  Anatomic [place]  Flu vaccine  Tdap Recommended ~ 28wks Glucose Screen  2 hr GBS  Feed Preference  Contraception  Circumcision  Childbirth Classes  Pediatrician      Past Surgical History:  Procedure Laterality Date  . colposcopy    . DILATION AND CURETTAGE OF UTERUS    . nexplanon    . WISDOM TOOTH EXTRACTION      There were no vitals filed for this visit.  Subjective Assessment - 06/05/18 1024    Subjective  Pt states she has been working hard to lose weight. Her neck is feeling good, she has been doing her exercises. She's still having a little bit of pain on the R side of her neck but much better from before.    Patient  Stated Goals  get better, pain decrease    Currently in Pain?  No/denies            [facility] Adult PT Treatment/Exercise - 06/05/18 0001      Neck Exercises: Machines for Strengthening   UBE (Upper Arm Bike)  x3 mins retro, L2, for postural strenghtening      Neck Exercises: Theraband   Scapula Retraction  15 reps;Red    Scapula Retraction Limitations  2 sets, with cervical retraction    Shoulder Extension  15 reps;Red    Shoulder Extension Limitations  2 sets, with cervical retraction    Rows  15 reps;Red;Limitations    Rows Limitations  2 sets, with cervical retraction    Other Theraband Exercises  cervical retraciton against wall with UE flexion +RTB x15      Neck Exercises: Standing   Other Standing Exercises  Y's on wall with liftoff with RTB x15 reps       Manual Therapy   Manual Therapy  Soft tissue mobilization;Joint mobilization    Manual therapy comments  completed  separate rest of treatment    Joint Mobilization  Grade III Central PAs C3-T2 for improved joint mobility and to improve cervical AROM    Soft tissue mobilization  STM R upper trap and levator scap to address soft tissue restritions and decrease pain      Neck Exercises: Stretches   Upper Trapezius Stretch  Right;Left;2 reps;30 seconds    Levator Stretch  Right;Left;2 reps;30 seconds    Other Neck Stretches  bil scalene stretch wtih towel 2x30" each            PT Education - 06/05/18 1022    Education Details  exercise technique    Person(s) Educated  Patient    Methods  Explanation;Demonstration    Comprehension  Verbalized understanding;Returned demonstration       PT Short Term Goals - 05/22/18 1207      PT SHORT TERM GOAL #1   Title  Pt will be independent with HEP and perform consistently in order to decrease pain and improve function.    Time  2    Period  Weeks    Status  On-going      PT SHORT TERM GOAL #2   Title  Pt will have imrpoved cervical AROM by 5 deg for flexion, ext, and  bil rotation in order to decrease pain and improve function.    Time  2    Period  Weeks    Status  On-going      PT SHORT TERM GOAL #3   Title  Pt will be able to perform deep neck flexor endurance test for 15 sec with proper form and without pain to demo improved functional strength of cervical musculature.    Time  2    Period  Weeks    Status  On-going      PT SHORT TERM GOAL #4   Title  Pt will score 14/50 or < on the NDI to demo decreased self-perceived disability due to her neck and maximzie her overall return to PLOF.     Time  2    Period  Weeks    Status  On-going        PT Long Term Goals - 05/22/18 1207      PT LONG TERM GOAL #1   Title  Pt will have improved cervical AROM by 10 deg with flexion, extension, and bil rotation to furhter decrease pain and maximize her driving ability.     Time  4    Period  Weeks    Status  On-going      PT LONG TERM GOAL #2   Title  Pt will be able to perform deep neck flexor endurnace test for 20 sec with proper form and without pain to demo further improved cervical musculature strength and decrease her pain.    Time  4    Period  Weeks    Status  On-going      PT LONG TERM GOAL #3   Title  Pt will have improved cervical MMT to 5/5 and without pain to demo improved overall function and allow her to perform household chores wtih greater ease.     Time  4    Period  Weeks    Status  On-going      PT LONG TERM GOAL #4   Title  Pt will reported decreased overall pain to 3/10 or < on a daily basis to allow her to perform ADLs and IADLs with greater ease.  Time  4    Period  Weeks    Status  On-going            Plan - 06/05/18 1106    Clinical Impression Statement  Pt making good progress towards goals, reporting decreased overall pain and positive responses with HEP. Continued with established POC focusing on postural and cervical strength as well as improving cervical mobility. Progressed reps and resistance throughout  therex this date, only min cues for proper form. No pain reported during session. Ended with manual for continued soft tissue restrictions in R upper trap and proximal cervical musculature. No pain reported during or after EOS. Pt POC good until 06/12/18, however, she is not scheduled for any appointments next week; due to this and her progress made thus far, will go ahead and reassess next visit.     Rehab Potential  Fair    PT Frequency  2x / week    PT Duration  4 weeks    PT Treatment/Interventions  ADLs/Self Care Home Management;Cryotherapy;Electrical Stimulation;Moist Heat;Ultrasound;Functional mobility training;Therapeutic activities;Therapeutic exercise;Balance training;Neuromuscular re-education;Patient/family education;Manual techniques;Passive range of motion;Dry needling;Energy conservation;Taping    PT Next Visit Plan  reassess; continue manual STM for pain control and soft tissue restrictions, postural strengthening and education, progressing as tolerated; address L upper trap during manual as needed.    PT Home Exercise Plan  eval: supine cervical retractions; 6/14: UT and levator stretch; 6/26: scap retraction, low and high rows with RTB    Consulted and Agree with Plan of Care  Patient       Patient will benefit from skilled therapeutic intervention in order to improve the following deficits and impairments:  Decreased activity tolerance, Decreased endurance, Decreased range of motion, Decreased strength, Hypermobility, Hypomobility, Increased muscle spasms, Impaired flexibility, Improper body mechanics, Postural dysfunction, Impaired UE functional use, Pain  Visit Diagnosis: Cervicalgia  Muscle weakness (generalized)  Abnormal posture     Problem List Patient Active Problem List   Diagnosis Date Noted  . Chiari I malformation (HCC) 02/13/2018  . Nonallopathic lesion of cervical region 02/13/2018  . Nonallopathic lesion of thoracic region 02/13/2018  . Nonallopathic lesion  of lumbosacral region 02/13/2018  . Current smoker 04/20/2017  . Headache 04/20/2017  . Encounter for initial prescription of contraceptive pills 02/21/2017  . Muscle tension headache 02/21/2017  . Postpartum depression 10/10/2016  . Rubella non-immune status, antepartum 07/04/2016  . Susceptible to varicella (non-immune), currently pregnant 07/04/2016  . Abnormal Pap smear of cervix 07/22/2014  . HPV in female 07/22/2014  . Mild dysplasia of cervix 07/22/2014  . Bipolar disorder (HCC) 06/04/2013  . Asthma 06/04/2013  . Depression 06/04/2013       Jac CanavanBrooke Ervey Fallin PT, DPT  [facility] [address] [place], [place], [address] Phone: [phone]   Fax:  [phone]  Name: Mary [name] MRN: [id number] Date of Birth: Jul 31, 1986

## 2018-06-07 ENCOUNTER — Ambulatory Visit (HOSPITAL_COMMUNITY): Payer: Medicare Other

## 2018-06-07 DIAGNOSIS — R293 Abnormal posture: Secondary | ICD-10-CM | POA: Diagnosis not present

## 2018-06-07 DIAGNOSIS — M542 Cervicalgia: Secondary | ICD-10-CM | POA: Diagnosis not present

## 2018-06-07 DIAGNOSIS — M6281 Muscle weakness (generalized): Secondary | ICD-10-CM | POA: Diagnosis not present
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
## 2018-06-17 ENCOUNTER — Ambulatory Visit: Payer: Medicare Other | Admitting: Clinical

## 2018-06-24 NOTE — Progress Notes (Signed)
[name] [address] [place], [place] [address] Phone: [phone] Subjective:     CC: Neck pain and headache follow-up  [id number]  Mary [name] is a 32 y.o. female coming in with complaint of neck and headache pain.  Has been doing physical therapy.  Has noticed that it has been improving but unfortunately has more dizziness and has not been lightheaded with activity and working out.  Has had this difficulty previously.  Almost passed out twice.     Past Medical History:  Diagnosis Date  . Asthma   . Bipolar 1 disorder ([facility])   . Bronchitis 01/31/2016  . Depression   . Headache(784.0)   . Mental disorder   . Post traumatic stress disorder (PTSD)   . Pregnant 01/21/2016  . Sinus infection 01/31/2016  . Supervision of normal pregnancy, antepartum 02/08/2016    [facility] at             13+1 week FOB   Mary [name] 32 yo WM Dating By  LMP and [place] Pap  02/08/16 GC/CT Initial:                36+wks: Genetic Screen NT/IT:  CF screen  Anatomic [place]  Flu vaccine  Tdap Recommended ~ 28wks Glucose Screen  2 hr GBS  Feed Preference  Contraception  Circumcision  Childbirth Classes  Pediatrician     Past Surgical History:  Procedure Laterality Date  . colposcopy    . DILATION AND CURETTAGE OF UTERUS    . nexplanon    . WISDOM TOOTH EXTRACTION     Social History   Socioeconomic History  . Marital status: Legally Separated    Spouse name: Not on file  . Number of children: 4  . Years of education: Not on file  . Highest education level: GED or equivalent  Occupational History  . Occupation: disabled    Comment: PTSD  Social Needs  . Financial resource strain: Not on file  . Food insecurity:    Worry: Not on file    Inability: Not on file  . Transportation needs:    Medical: Not on file    Non-medical: Not on file  Tobacco Use  . Smoking status: Current Some Day Smoker    Packs/day: 0.50    Types: Cigarettes  .  Smokeless tobacco: Never Used  . Tobacco comment: NEVER USE SNUFF OR CHEWING TOBACCO  Substance and Sexual Activity  . Alcohol use: No  . Drug use: No  . Sexual activity: Yes    Birth control/protection: None, Condom  Lifestyle  . Physical activity:    Days per week: Not on file    Minutes per session: Not on file  . Stress: Not on file  Relationships  . Social connections:    Talks on phone: Not on file    Gets together: Not on file    Attends religious service: Not on file    Active member of club or organization: Not on file    Attends meetings of clubs or organizations: Not on file    Relationship status: Not on file  Other Topics Concern  . Not on file  Social History Narrative   Patient is right-handed. She is separated, has 4 sons. Is on disabilty for PTSD. She has one caffeine drink daily, walks 3 x week.   Allergies  Allergen Reactions  . Codeine Shortness Of Breath    Unknown Has taken vicodin  and percocet without problem per pt  . Peanut-Containing Drug Products Shortness Of Breath  . Sulfa Antibiotics Anaphylaxis and Itching   Family History  Problem Relation Age of Onset  . Cancer Mother [id number]       breast  . Hypertension Mother   . Depression Mother   . Depression Father   . Seizures Father   . Cancer Maternal Grandmother        breast and cervical  . Asthma Son   . Asthma Son   . Hypertension Other   . Cancer Other      Past medical history, social, surgical and family history all reviewed in electronic medical record.  No pertanent information unless stated regarding to the chief complaint.   Review of Systems:Review of systems updated and as accurate as of 06/25/18  No  visual changes, nausea, vomiting, diarrhea, constipation, dizziness, abdominal pain, skin rash, fevers, chills, night sweats, weight loss, swollen lymph nodes, body aches, joint swelling, chest pain, shortness of breath, mood changes.  Positive muscle aches and headache  Objective    Blood pressure 100/80, pulse 93, height 5\' 4"  (1.626 m), weight 166 lb (75.3 kg), SpO2 98 %, not currently breastfeeding. Systems examined below as of 06/25/18   General: No apparent distress alert and oriented x3 mood and affect normal, dressed appropriately.  HEENT: Pupils equal, extraocular movements intact  Respiratory: Patient's speak in full sentences and does not appear short of breath  Cardiovascular: No lower extremity edema, non tender, no erythema  Skin: Warm dry intact with no signs of infection or rash on extremities or on axial skeleton.  Abdomen: Soft nontender  Neuro: Cranial nerves II through XII are intact, neurovascularly intact in all extremities with 2+ DTRs and 2+ pulses.  Lymph: No lymphadenopathy of posterior or anterior cervical chain or axillae bilaterally.  Gait normal with good balance and coordination.  MSK:  Non tender with full range of motion and good stability and symmetric strength and tone of shoulders, elbows, wrist, hip, knee and ankles bilaterally.  Neck: Inspection mild loss of lordosis. No palpable stepoffs. Negative Spurling's maneuver. Limited range of motion in 5 degrees of sidebending and rotation bilaterally Grip strength and sensation normal in bilateral hands Strength good C4 to T1 distribution No sensory change to C4 to T1 Negative Hoffman sign bilaterally Reflexes normal Patient does have trapezius muscle spasms.  Osteopathic findings C2 flexed rotated and side bent right C4 flexed rotated and side bent left C6 flexed rotated and side bent right T3 extended rotated and side bent right inhaled third rib T7 extended rotated and side bent left L4 flexed rotated and side bent right Sacrum right on right     Impression and Recommendations:     This case required medical decision making of moderate complexity.      Note: This dictation was prepared with Dragon dictation along with smaller phrase technology. Any transcriptional  errors that result from this process are unintentional.

## 2018-06-25 ENCOUNTER — Encounter: Payer: Self-pay | Admitting: Family Medicine

## 2018-06-25 ENCOUNTER — Ambulatory Visit (INDEPENDENT_AMBULATORY_CARE_PROVIDER_SITE_OTHER): Payer: Medicare Other | Admitting: Family Medicine

## 2018-06-25 ENCOUNTER — Other Ambulatory Visit (INDEPENDENT_AMBULATORY_CARE_PROVIDER_SITE_OTHER): Payer: Medicare Other

## 2018-06-25 VITALS — BP 100/80 | HR 93 | Ht 64.0 in | Wt 166.0 lb

## 2018-06-25 DIAGNOSIS — M9908 Segmental and somatic dysfunction of rib cage: Secondary | ICD-10-CM

## 2018-06-25 DIAGNOSIS — M2550 Pain in unspecified joint: Secondary | ICD-10-CM

## 2018-06-25 DIAGNOSIS — M999 Biomechanical lesion, unspecified: Secondary | ICD-10-CM

## 2018-06-25 DIAGNOSIS — G44209 Tension-type headache, unspecified, not intractable: Secondary | ICD-10-CM | POA: Diagnosis not present

## 2018-06-25 DIAGNOSIS — M9902 Segmental and somatic dysfunction of thoracic region: Secondary | ICD-10-CM | POA: Diagnosis not present

## 2018-06-25 DIAGNOSIS — M9901 Segmental and somatic dysfunction of cervical region: Secondary | ICD-10-CM

## 2018-06-25 DIAGNOSIS — R55 Syncope and collapse: Secondary | ICD-10-CM | POA: Insufficient documentation

## 2018-06-25 DIAGNOSIS — M9903 Segmental and somatic dysfunction of lumbar region: Secondary | ICD-10-CM

## 2018-06-25 DIAGNOSIS — M9904 Segmental and somatic dysfunction of sacral region: Secondary | ICD-10-CM

## 2018-06-25 DIAGNOSIS — R700 Elevated erythrocyte sedimentation rate: Secondary | ICD-10-CM | POA: Diagnosis not present

## 2018-06-25 LAB — COMPREHENSIVE METABOLIC PANEL
ALT: 14 U/L (ref 0–35)
AST: 14 U/L (ref 0–37)
Albumin: 4.2 g/dL (ref 3.5–5.2)
Alkaline Phosphatase: 65 U/L (ref 39–117)
BUN: 10 mg/dL (ref 6–23)
CALCIUM: 9.1 mg/dL (ref 8.4–10.5)
CO2: 26 meq/L (ref 19–32)
CREATININE: 0.83 mg/dL (ref 0.40–1.20)
Chloride: 105 mEq/L (ref 96–112)
GFR: 84.87 mL/min (ref >60.00)
Glucose, Bld: 92 mg/dL (ref 70–99)
Potassium: 3.6 mEq/L (ref 3.5–5.1)
SODIUM: 138 meq/L (ref 135–145)
Total Bilirubin: 0.3 mg/dL (ref 0.2–1.2)
Total Protein: 7.3 g/dL (ref 6.0–8.3)

## 2018-06-25 LAB — CBC WITH DIFFERENTIAL/PLATELET
BASOS ABS: 0.1 10*3/uL (ref 0.0–0.1)
Basophils Relative: 0.8 % (ref 0.0–3.0)
EOS ABS: 0.6 10*3/uL (ref 0.0–0.7)
Eosinophils Relative: 6.5 % — ABNORMAL HIGH (ref 0.0–5.0)
HEMATOCRIT: 39.2 % (ref 36.0–46.0)
HEMOGLOBIN: 13.2 g/dL (ref 12.0–15.0)
LYMPHS PCT: 23.9 % (ref 12.0–46.0)
Lymphs Abs: 2.1 10*3/uL (ref 0.7–4.0)
MCHC: 33.6 g/dL (ref 30.0–36.0)
MCV: 93.1 fl (ref 78.0–100.0)
Monocytes Absolute: 0.4 10*3/uL (ref 0.1–1.0)
Monocytes Relative: 4.6 % (ref 3.0–12.0)
NEUTROS ABS: 5.8 10*3/uL (ref 1.4–7.7)
Neutrophils Relative %: 64.2 % (ref 43.0–77.0)
PLATELETS: 226 10*3/uL (ref 150.0–400.0)
RBC: 4.21 Mil/uL (ref 3.87–5.11)
RDW: 13.9 % (ref 11.5–15.5)
WBC: 9 10*3/uL (ref 4.0–10.5)

## 2018-06-25 LAB — URIC ACID: Uric Acid, Serum: 4.3 mg/dL (ref 2.4–7.0)

## 2018-06-25 LAB — TSH: TSH: 1.44 u[IU]/mL (ref 0.35–4.50)

## 2018-06-25 LAB — SEDIMENTATION RATE: Sed Rate: 25 mm/hr — ABNORMAL HIGH (ref 0–20)

## 2018-06-25 LAB — IBC PANEL
Iron: 42 ug/dL (ref 42–145)
Saturation Ratios: 10.3 % — ABNORMAL LOW (ref 20.0–50.0)
Transferrin: 291 mg/dL (ref 212.0–360.0)

## 2018-06-25 LAB — C-REACTIVE PROTEIN: CRP: 0.3 mg/dL — ABNORMAL LOW (ref 0.5–20.0)

## 2018-06-25 NOTE — Patient Instructions (Addendum)
Good to see you  Ice is your friend  Try effexor 75mg  daily and then write me in 7-10 days to see how you are doing.  Labs today just to check out the dizziness.  Also cardiology may call you to check out the dizziness with exercise. Avoid strenous exercise for now. .  See me again in 6-8 weeks

## 2018-06-25 NOTE — Assessment & Plan Note (Signed)
Decision today to treat with OMT was based on Physical Exam  After verbal consent patient was treated with HVLA, ME, FPR techniques in cervical, thoracic, rib, lumbar and sacral areas  Patient tolerated the procedure well with improvement in symptoms  Patient given exercises, stretches and lifestyle modifications  See medications in patient instructions if given  Patient will follow up in 4 weeks 

## 2018-06-25 NOTE — Assessment & Plan Note (Signed)
Near syncopal event.  Laboratory work-up ordered.  Referral to cardiology with it being associated with exercise.  Patient declined EKG today when would rather have it done by cardiology.

## 2018-06-25 NOTE — Assessment & Plan Note (Signed)
Continues to have tension.  Seems to be more posture and ergonomics.  Does have some mood disorder that could be contributing.  Responds well to manipulation.  Chiari I malformation as well.  Follow-up again in 4 to 6 weeks

## 2018-06-27 LAB — VITAMIN D 1,25 DIHYDROXY
VITAMIN D3 1, 25 (OH): 47 pg/mL
Vitamin D 1, 25 (OH)2 Total: 47 pg/mL (ref 18–72)
Vitamin D2 1, 25 (OH)2: <8 pg/mL

## 2018-06-27 LAB — PTH, INTACT AND CALCIUM
Calcium: 9.2 mg/dL (ref 8.6–10.2)
PTH: 39 pg/mL (ref 14–64)

## 2018-06-27 LAB — RHEUMATOID FACTOR

## 2018-06-27 LAB — CYCLIC CITRUL PEPTIDE ANTIBODY, IGG: Cyclic Citrullin Peptide Ab: <16 UNITS

## 2018-06-27 LAB — ANGIOTENSIN CONVERTING ENZYME: Angiotensin-Converting Enzyme: 37 U/L (ref 9–67)

## 2018-06-27 LAB — ANA: Anti Nuclear Antibody(ANA): NEGATIVE

## 2018-06-27 LAB — CALCIUM, IONIZED: Calcium, Ion: 5.05 mg/dL (ref 4.8–5.6)

## 2018-07-02 DIAGNOSIS — G43711 Chronic migraine without aura, intractable, with status migrainosus: Secondary | ICD-10-CM | POA: Diagnosis not present

## 2018-07-02 DIAGNOSIS — H5213 Myopia, bilateral: Secondary | ICD-10-CM | POA: Diagnosis not present

## 2018-07-02 DIAGNOSIS — H04123 Dry eye syndrome of bilateral lacrimal glands: Secondary | ICD-10-CM | POA: Diagnosis not present

## 2018-07-15 ENCOUNTER — Ambulatory Visit (INDEPENDENT_AMBULATORY_CARE_PROVIDER_SITE_OTHER): Payer: Medicare Other | Admitting: Neurology

## 2018-07-15 ENCOUNTER — Encounter: Payer: Self-pay | Admitting: Neurology

## 2018-07-15 ENCOUNTER — Other Ambulatory Visit: Payer: Self-pay

## 2018-07-15 VITALS — BP 118/70 | HR 94 | Ht 64.0 in | Wt 168.0 lb

## 2018-07-15 DIAGNOSIS — G43009 Migraine without aura, not intractable, without status migrainosus: Secondary | ICD-10-CM | POA: Diagnosis not present

## 2018-07-15 DIAGNOSIS — G935 Compression of brain: Secondary | ICD-10-CM | POA: Diagnosis not present

## 2018-07-15 NOTE — Patient Instructions (Signed)
Continue Effexor Continue Imitrex as needed Follow up in 6 months.
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
## 2018-07-16 DIAGNOSIS — M542 Cervicalgia: Secondary | ICD-10-CM | POA: Diagnosis not present

## 2018-07-16 DIAGNOSIS — G935 Compression of brain: Secondary | ICD-10-CM | POA: Diagnosis not present

## 2018-07-17 ENCOUNTER — Ambulatory Visit: Payer: Self-pay | Admitting: *Deleted

## 2018-07-17 DIAGNOSIS — H5213 Myopia, bilateral: Secondary | ICD-10-CM | POA: Diagnosis not present

## 2018-07-17 NOTE — Telephone Encounter (Signed)
Patient phoned for appointment for lower back pain she began having 1-2 weeks ago. She mainly has pain with prolonged sitting and lying and will experience numb legs and feet along with the back pain. She has seen Dr. Antoine PrimasZachary Smith for neck and back issues related to her diagnosis Chiari Malformation. The main concern at this time is when she bends over she feels her back locking up. The pain moves up her back when this happens. Denies bladder/bowel concerns. Reports she does not take anything for the pain and so far, cbd bath bombs while she is soaking gives her mild relief. Her primary [name]. She has not seen her since 10/18 but has seen Dr. Michiel SitesZ. Smith several times this year. Dr. Katrinka BlazingSmith has no availability over the next 10 days. Reason for Disposition . [1] MODERATE back pain (e.g., interferes with normal activities) AND [2] present > 3 days  Answer Assessment - Initial Assessment Questions 1. ONSET: "When did the pain begin?"      4 days ago 2. LOCATION: "Where does it hurt?" (upper, mid or lower back)     Lower back pain 3. SEVERITY: "How bad is the pain?"  (e.g., Scale 1-10; mild, moderate, or severe)   - MILD (1-3): doesn't interfere with normal activities    - MODERATE (4-7): interferes with normal activities or awakens from sleep    - SEVERE (8-10): excruciating pain, unable to do any normal activities      severe 4. PATTERN: "Is the pain constant?" (e.g., yes, no; constant, intermittent)      Constant since saturday 5. RADIATION: "Does the pain shoot into your legs or elsewhere?"    Shoots upward to the upper back when she bends over.  6. CAUSE:  "What do you think is causing the back pain?"      Chiari Malformation 7. BACK OVERUSE:  "Any recent lifting of heavy objects, strenuous work or exercise?"     No.  8. MEDICATIONS: "What have you taken so far for the pain?" (e.g., nothing, acetaminophen, NSAIDS)     cbd bath bombs that help for a short  time.  9. NEUROLOGIC SYMPTOMS: "Do you have any weakness, numbness, or problems with bowel/bladder control?"     Losing feeling in her feet at times after lying or sitting.  10. OTHER SYMPTOMS: "Do you have any other symptoms?" (e.g., fever, abdominal pain, burning with urination, blood in urine)       no 11. PREGNANCY: "Is there any chance you are pregnant?" (e.g., yes, no; LMP)       no  Protocols used: BACK PAIN-A-AH

## 2018-07-18 ENCOUNTER — Encounter: Payer: Self-pay | Admitting: Family Medicine

## 2018-07-18 ENCOUNTER — Ambulatory Visit (INDEPENDENT_AMBULATORY_CARE_PROVIDER_SITE_OTHER): Payer: Medicare Other | Admitting: Family Medicine

## 2018-07-18 VITALS — BP 126/68 | HR 98 | Ht 64.0 in | Wt 169.0 lb

## 2018-07-18 DIAGNOSIS — M545 Low back pain, unspecified: Secondary | ICD-10-CM

## 2018-07-18 NOTE — Patient Instructions (Signed)
Nice to meet you  Please try the exercises  Please try the anti-inflammatory for about 5 days straight and then as needed  Please see me or Dr. Katrinka BlazingSmith back in 3-4 weeks.

## 2018-07-18 NOTE — Progress Notes (Signed)
Mary [name] - 32 y.o. female MRN [id number]  Date of birth: 02-11-1986  SUBJECTIVE:  Including CC & ROS.  Chief Complaint  Patient presents with  . Back Pain    Mary [name] is a 32 y.o. female that is presenting with lower back pain. Has been ongoing for two week. Pain is located near her tailbone. Pain is intermittent, standing and sitting for long periods trigger the pain. She has been taking advil for the pain. Denies injury or trauma. She felt the same symptoms when she was pregnant.    Review of the lumbar x-ray from 2004 shows a normal exam and alignment.  Review of Systems  Constitutional: Negative for fever.  HENT: Negative for congestion.   Respiratory: Negative for cough.   Cardiovascular: Negative for chest pain.  Gastrointestinal: Negative for abdominal pain.  Musculoskeletal: Positive for back pain.  Skin: Negative for color change.  Neurological: Negative for weakness.  Hematological: Negative for adenopathy.  Psychiatric/Behavioral: Negative for agitation.    HISTORY: Past Medical, Surgical, Social, and Family History Reviewed & Updated per EMR.   Pertinent Historical Findings include:  Past Medical History:  Diagnosis Date  . Asthma   . Bipolar 1 disorder (HCC)   . Bronchitis 01/31/2016  . Depression   . Headache(784.0)   . Mental disorder   . Post traumatic stress disorder (PTSD)   . Pregnant 01/21/2016  . Sinus infection 01/31/2016  . Supervision of normal pregnancy, antepartum 02/08/2016    [facility] at             13+1 week FOB   Mary [name] 32 yo WM Dating By  LMP and [place] Pap  02/08/16 GC/CT Initial:                36+wks: Genetic Screen NT/IT:  CF screen  Anatomic [place]  Flu vaccine  Tdap Recommended ~ 28wks Glucose Screen  2 hr GBS  Feed Preference  Contraception  Circumcision  Childbirth Classes  Pediatrician      Past Surgical History:  Procedure Laterality Date  . colposcopy    . DILATION AND CURETTAGE OF UTERUS    .  nexplanon    . WISDOM TOOTH EXTRACTION      Allergies  Allergen Reactions  . Codeine Shortness Of Breath    Unknown Has taken vicodin and percocet without problem per pt  . Peanut-Containing Drug Products Shortness Of Breath  . Sulfa Antibiotics Anaphylaxis and Itching    Family History  Problem Relation Age of Onset  . Cancer Mother 73       breast  . Hypertension Mother   . Depression Mother   . Depression Father   . Seizures Father   . Cancer Maternal Grandmother        breast and cervical  . Asthma Son   . Asthma Son   . Hypertension Other   . Cancer Other      Social History   Socioeconomic History  . Marital status: Legally Separated    Spouse name: Not on file  . Number of children: 4  . Years of education: Not on file  . Highest education level: GED or equivalent  Occupational History  . Occupation: disabled    Comment: PTSD  Social Needs  . Financial resource strain: Not on file  . Food insecurity:    Worry: Not on file    Inability: Not on file  . Transportation needs:    Medical: Not  on file    Non-medical: Not on file  Tobacco Use  . Smoking status: Current Some Day Smoker    Packs/day: 0.50    Types: Cigarettes  . Smokeless tobacco: Never Used  . Tobacco comment: NEVER USE SNUFF OR CHEWING TOBACCO  Substance and Sexual Activity  . Alcohol use: No  . Drug use: No  . Sexual activity: Yes    Birth control/protection: None, Condom  Lifestyle  . Physical activity:    Days per week: Not on file    Minutes per session: Not on file  . Stress: Not on file  Relationships  . Social connections:    Talks on phone: Not on file    Gets together: Not on file    Attends religious service: Not on file    Active member of club or organization: Not on file    Attends meetings of clubs or organizations: Not on file    Relationship status: Not on file  . Intimate partner violence:    Fear of current or ex partner: Not on file    Emotionally abused:  Not on file    Physically abused: Not on file    Forced sexual activity: Not on file  Other Topics Concern  . Not on file  Social History Narrative   Patient is right-handed. She is separated, has 4 sons. Is on disabilty for PTSD. She has one caffeine drink daily, walks 3 x week.     PHYSICAL EXAM:  VS: BP 126/68 (BP Location: Left Arm, Patient Position: Sitting, Cuff Size: Normal)   Pulse 98   Ht 5\' 4"  (1.626 m)   Wt 169 lb (76.7 kg)   SpO2 (!) 89%   BMI 29.01 kg/m  Physical Exam Gen: NAD, alert, cooperative with exam, well-appearing ENT: normal lips, normal nasal mucosa,  Eye: normal EOM, normal conjunctiva and lids CV:  no edema, +2 pedal pulses   Resp: no accessory muscle use, non-labored,  Skin: no rashes, no areas of induration  Neuro: normal tone, normal sensation to touch Psych:  normal insight, alert and oriented MSK:  Back Exam:  Inspection: Unremarkable  TTp in the lower back  Range of Motion:  Flexion 45 deg; Extension 45 deg; Side Bending to 45 deg bilaterally; Rotation to 45 deg bilaterally  Leg strength: Quad: 5/5 Hamstring: 5/5 Hip flexor: 5/5  Weakness with hip abduction  Strength at foot: Plantar-flexion: 5/5 Dorsi-flexion: 5/5 Eversion: 5/5 Inversion: 5/5  Reflexes: 2+ at both patellar tendons,  Gait unremarkable. SLR laying: Negative  XSLR laying: Negative  FABER: negative. Neurovascularly intact      ASSESSMENT & PLAN:   Acute bilateral low back pain without sciatica Pain appears to be muscular in nature. Doesn't appear to be radicular in nature.  -Duexis samples provided -Counseled on home exercise therapy. -Counseled on supportive care. -If no improvement can consider trigger point injections or physical therapy.

## 2018-07-19 DIAGNOSIS — M545 Low back pain, unspecified: Secondary | ICD-10-CM | POA: Insufficient documentation

## 2018-07-19 NOTE — Assessment & Plan Note (Signed)
Pain appears to be muscular in nature. Doesn't appear to be radicular in nature.  -Duexis samples provided -Counseled on home exercise therapy. -Counseled on supportive care. -If no improvement can consider trigger point injections or physical therapy.

## 2018-07-24 ENCOUNTER — Ambulatory Visit: Payer: Medicaid Other | Admitting: Clinical

## 2018-07-24 ENCOUNTER — Telehealth: Payer: Self-pay

## 2018-07-24 NOTE — Telephone Encounter (Signed)
Wants a 2nd opinion with Neurosurgeon

## 2018-07-25 NOTE — Telephone Encounter (Signed)
[facility]

## 2018-07-25 NOTE — Telephone Encounter (Signed)
Pt needs to be seen

## 2018-07-30 ENCOUNTER — Ambulatory Visit: Payer: Medicare Other | Admitting: Clinical

## 2018-07-30 ENCOUNTER — Encounter: Payer: Self-pay | Admitting: Cardiology

## 2018-07-30 ENCOUNTER — Ambulatory Visit (INDEPENDENT_AMBULATORY_CARE_PROVIDER_SITE_OTHER): Payer: Medicare Other | Admitting: Cardiology

## 2018-07-30 VITALS — BP 120/80 | HR 92 | Ht 64.0 in | Wt 167.0 lb

## 2018-07-30 DIAGNOSIS — R55 Syncope and collapse: Secondary | ICD-10-CM | POA: Diagnosis not present

## 2018-07-30 DIAGNOSIS — R42 Dizziness and giddiness: Secondary | ICD-10-CM

## 2018-07-30 DIAGNOSIS — G935 Compression of brain: Secondary | ICD-10-CM

## 2018-07-30 DIAGNOSIS — R002 Palpitations: Secondary | ICD-10-CM | POA: Diagnosis not present

## 2018-07-30 NOTE — Assessment & Plan Note (Signed)
R/O cardiac etiology 

## 2018-07-30 NOTE — Progress Notes (Signed)
07/30/2018 Mary [name]   Apr 18, 1986  [id number]  Primary Physician Junie SpencerHawks, [name] A, FNP Primary Cardiologist: Dr Duke Salviaandolph (new)  HPI:  Pleasant 32 y/o female with 4 children, history of Chiari formation, and PTSD. She has been followed by Dr Wynetta Emeryram and was to have surgery for her Chiari formation in May but had to reschedule for family reasons. She is referred to us today by her PCP secondary to a history of near syncope and palpitations. The pt describes several episodes of near syncope when she is rushing associated with tachycardia. She has multiple other complaints that she relates to her Chiari formation. She does drink caffeine and vapes CBD oil.    Current Outpatient Medications  Medication Sig Dispense Refill  . acetaminophen (TYLENOL) 325 MG tablet Take 650 mg by mouth every 6 (six) hours as needed for moderate pain or headache. Reported on 05/01/2016    . CALCIUM-MAGNESIUM PO Take 1 tablet by mouth daily.    . fluticasone (FLONASE) 50 MCG/ACT nasal spray Place 1-2 sprays into both nostrils daily as needed for allergies.     Marland Kitchen. ibuprofen (ADVIL,MOTRIN) 200 MG tablet Take 200 mg by mouth every 6 (six) hours as needed.    . montelukast (SINGULAIR) 10 MG tablet Take 1 tablet (10 mg total) by mouth at bedtime. 30 tablet 1  . SUMAtriptan (IMITREX) 50 MG tablet Take 1 tablet earliest onset of headache.  May repeat once in 2 hours if headache persists or recurs. 10 tablet 2  . venlafaxine XR (EFFEXOR-XR) 37.5 MG 24 hr capsule TAKE 1 CAPSULE (37.5 MG TOTAL) BY MOUTH DAILY WITH BREAKFAST. 90 capsule 1   Current Facility-Administered Medications  Medication Dose Route Frequency Provider Last Rate Last Dose  . Vitamin D (Ergocalciferol) (DRISDOL) capsule 50,000 Units  50,000 Units Oral Q7 days Judi SaaSmith, Zachary M, DO        Allergies  Allergen Reactions  . Codeine Shortness Of Breath    Unknown Has taken vicodin and percocet without problem per pt  . Peanut-Containing Drug Products  Shortness Of Breath  . Sulfa Antibiotics Anaphylaxis and Itching    Past Medical History:  Diagnosis Date  . Asthma   . Bronchitis 01/31/2016  . Depression   . Headache(784.0)   . Mental disorder   . Post traumatic stress disorder (PTSD)   . Pregnant 01/21/2016  . Sinus infection 01/31/2016  . Supervision of normal pregnancy, antepartum 02/08/2016    [facility] at             13+1 week FOB   Fabienne BrunsMichael Sinopoli 32 yo WM Dating By  LMP and US Pap  02/08/16 GC/CT Initial:                36+wks: Genetic Screen NT/IT:  CF screen  Anatomic US  Flu vaccine  Tdap Recommended ~ 28wks Glucose Screen  2 hr GBS  Feed Preference  Contraception  Circumcision  Childbirth Classes  Pediatrician      Social History   Socioeconomic History  . Marital status: Legally Separated    Spouse name: Not on file  . Number of children: 4  . Years of education: Not on file  . Highest education level: GED or equivalent  Occupational History  . Occupation: disabled    Comment: PTSD  Social Needs  . Financial resource strain: Not on file  . Food insecurity:    Worry: Not on file    Inability: Not on file  .  Transportation needs:    Medical: Not on file    Non-medical: Not on file  Tobacco Use  . Smoking status: Current Some Day Smoker    Packs/day: 0.50    Types: Cigarettes  . Smokeless tobacco: Never Used  . Tobacco comment: NEVER USE SNUFF OR CHEWING TOBACCO  Substance and Sexual Activity  . Alcohol use: No  . Drug use: No  . Sexual activity: Yes    Birth control/protection: None, Condom  Lifestyle  . Physical activity:    Days per week: Not on file    Minutes per session: Not on file  . Stress: Not on file  Relationships  . Social connections:    Talks on phone: Not on file    Gets together: Not on file    Attends religious service: Not on file    Active member of club or organization: Not on file    Attends meetings of clubs or organizations: Not on file    Relationship  status: Not on file  . Intimate partner violence:    Fear of current or ex partner: Not on file    Emotionally abused: Not on file    Physically abused: Not on file    Forced sexual activity: Not on file  Other Topics Concern  . Not on file  Social History Narrative   Patient is right-handed. She is separated, has 4 sons. Is on disabilty for PTSD. She has one caffeine drink daily, walks 3 x week.     Family History  Problem Relation Age of Onset  . Cancer Mother [id number]       breast  . Hypertension Mother   . Depression Mother   . Depression Father   . Seizures Father   . Cancer Maternal Grandmother        breast and cervical  . Asthma Son   . Asthma Son   . Hypertension Other   . Cancer Other      Review of Systems: General: negative for chills, fever, night sweats or weight changes.  Cardiovascular: negative for chest pain, edema, orthopnea, paroxysmal nocturnal dyspnea  Dermatological: negative for rash Respiratory: negative for cough or wheezing Urologic: negative for hematuria Abdominal: negative for nausea, vomiting, diarrhea, bright red blood per rectum, melena, or hematemesis Neurologic: negative for visual changes, syncope, or dizziness All other systems reviewed and are otherwise negative except as noted above.    Blood pressure 120/80, pulse 92, height 5\' 4"  (1.626 m), weight 167 lb (75.8 kg), not currently breastfeeding.  General appearance: alert, cooperative, no distress and mildly obese Neck: no carotid bruit and no JVD Lungs: clear to auscultation bilaterally Heart: regular rate and rhythm Abdomen: soft, non-tender; bowel sounds normal; no masses,  no organomegaly Extremities: extremities normal, atraumatic, no cyanosis or edema Pulses: 2+ and symmetric Skin: Skin color, texture, turgor normal. No rashes or lesions Neurologic: Grossly normal  EKG NSR, HR 92  ASSESSMENT AND PLAN:   Near syncope R/O cardiac etiology  Palpitations R/O arrhythmia,  avoid caffeine and vaping CBD oil  Chiari I malformation (HCC) Surgical clearance once her echo and Holter have been reviewed.    PLAN  Avoid caffeine, stop vaping CBD oil, check 7 day Holter, check echo- surgical clearance pending above test. F/U Dr Duke Salviaandolph in 4 weeks.   Corine ShelterLuke Camara Renstrom PA-C 07/30/2018 3:23 PM

## 2018-07-30 NOTE — Patient Instructions (Addendum)
Medication Instructions:  Your physician recommends that you continue on your current medications as directed. Please refer to the Current Medication list given to you today. If you need a refill on your cardiac medications before your next appointment, please call your pharmacy.  Labwork: None   Testing/Procedures: Your physician has requested that you have an echocardiogram. Echocardiography is a painless test that uses sound waves to create images of your heart. It provides your doctor with information about the size and shape of your heart and how well your heart's chambers and valves are working. This procedure takes approximately one hour. There are no restrictions for this procedure.  Your physician has recommended that you wear an 7 DAY event monitor. Event monitors are medical devices that record the heart's electrical activity. Doctors most often us these monitors to diagnose arrhythmias. Arrhythmias are problems with the speed or rhythm of the heartbeat. The monitor is a small, portable device. You can wear one while you do your normal daily activities. This is usually used to diagnose what is causing palpitations/syncope (passing out). [address]  Follow-Up: Your physician recommends that you schedule a follow-up appointment in: 4 WEEKS WITH DR [place](MAKE APPT AFTER ALL TESTING)  Any Other Special Instructions Will Be Listed Below (If Applicable).

## 2018-07-30 NOTE — Assessment & Plan Note (Signed)
Surgical clearance once her echo and Holter have been reviewed.

## 2018-07-30 NOTE — Assessment & Plan Note (Signed)
R/O arrhythmia, avoid caffeine and vaping CBD oil

## 2018-08-02 DIAGNOSIS — J45909 Unspecified asthma, uncomplicated: Secondary | ICD-10-CM | POA: Diagnosis not present

## 2018-08-02 DIAGNOSIS — Z79899 Other long term (current) drug therapy: Secondary | ICD-10-CM | POA: Diagnosis not present

## 2018-08-05 ENCOUNTER — Telehealth: Payer: Self-pay | Admitting: Family

## 2018-08-05 NOTE — Telephone Encounter (Signed)
Left message to call back  

## 2018-08-06 NOTE — Telephone Encounter (Signed)
FYI for provider:  Patient had been to an urgent or emergeny care for her panic attack.   The doctor advised "no medications would be given", and she felt he was condescending and treated her as a drug seeker.    Discussed practicing coping skills for her panic attacks.  She is doing Yoga , focusing on breathing and placing herself in a peaceful environment as coping practicing.   She  says it is helpful.    She wants provider to know that she may need to see her again if she is unable to resolve the next panic attack.  I advised patient to call for an appointment if that occurred. I assumed she would want to discuss medications for panic issues if she were to schedule.

## 2018-08-07 ENCOUNTER — Ambulatory Visit: Payer: Medicare Other | Admitting: Family

## 2018-08-15 ENCOUNTER — Encounter: Payer: Self-pay | Admitting: Family Medicine

## 2018-08-15 ENCOUNTER — Ambulatory Visit (INDEPENDENT_AMBULATORY_CARE_PROVIDER_SITE_OTHER): Payer: Medicare Other | Admitting: Family Medicine

## 2018-08-15 VITALS — BP 118/98 | HR 87 | Ht 64.0 in | Wt 173.0 lb

## 2018-08-15 DIAGNOSIS — M9902 Segmental and somatic dysfunction of thoracic region: Secondary | ICD-10-CM | POA: Diagnosis not present

## 2018-08-15 DIAGNOSIS — M9901 Segmental and somatic dysfunction of cervical region: Secondary | ICD-10-CM

## 2018-08-15 DIAGNOSIS — G44201 Tension-type headache, unspecified, intractable: Secondary | ICD-10-CM | POA: Diagnosis not present

## 2018-08-15 DIAGNOSIS — G935 Compression of brain: Secondary | ICD-10-CM | POA: Diagnosis not present

## 2018-08-15 DIAGNOSIS — M999 Biomechanical lesion, unspecified: Secondary | ICD-10-CM | POA: Diagnosis not present

## 2018-08-15 DIAGNOSIS — M9904 Segmental and somatic dysfunction of sacral region: Secondary | ICD-10-CM

## 2018-08-15 DIAGNOSIS — M9903 Segmental and somatic dysfunction of lumbar region: Secondary | ICD-10-CM

## 2018-08-15 MED ORDER — HYDROXYZINE HCL 10 MG PO TABS: 10.0000 mg | ORAL_TABLET | Freq: Three times a day (TID) | ORAL | 0 refills | Status: DC | PRN

## 2018-08-15 NOTE — Assessment & Plan Note (Signed)
Decision today to treat with OMT was based on Physical Exam  After verbal consent patient was treated with HVLA, ME, FPR techniques in cervical, thoracic, lumbar and sacral areas  Patient tolerated the procedure well with improvement in symptoms  Patient given exercises, stretches and lifestyle modifications  See medications in patient instructions if given  Patient will follow up in 6-12 weeks 

## 2018-08-15 NOTE — Progress Notes (Signed)
[name] [address] [place], [place] [address] Phone: [phone] Subjective:   Mary [name], am serving as a scribe for Dr. Antoine Primas.   CC: Neck pain follow-up  AYO:[name]  [name] is a 32 y.o. female coming in with complaint of neck pain. She states that her neck is not any better than last visit.  Was doing some exercises but had to stop them due to a heart problem. She complains of hip pain and lower back pain since last visit. Experienced a back spasm when trying to pick up a goldfish off the floor. Began to have a migraine following the spasms. Does have migraines daily since then. Is using Effexor but states that is it not working.        Past Medical History:  Diagnosis Date  . Asthma   . Bronchitis 01/31/2016  . Depression   . Headache(784.0)   . Mental disorder   . Post traumatic stress disorder (PTSD)   . Pregnant 01/21/2016  . Sinus infection 01/31/2016  . Supervision of normal pregnancy, antepartum 02/08/2016    [facility] at             13+1 week FOB   Mary [name] 32 yo WM Dating By  LMP and [place] Pap  02/08/16 GC/CT Initial:                36+wks: Genetic Screen NT/IT:  CF screen  Anatomic [place]  Flu vaccine  Tdap Recommended ~ 28wks Glucose Screen  2 hr GBS  Feed Preference  Contraception  Circumcision  Childbirth Classes  Pediatrician     Past Surgical History:  Procedure Laterality Date  . colposcopy    . DILATION AND CURETTAGE OF UTERUS    . nexplanon    . WISDOM TOOTH EXTRACTION     Social History   Socioeconomic History  . Marital status: Legally Separated    Spouse name: Not on file  . Number of children: 4  . Years of education: Not on file  . Highest education level: GED or equivalent  Occupational History  . Occupation: disabled    Comment: PTSD  Social Needs  . Financial resource strain: Not on file  . Food insecurity:    Worry: Not on file    Inability: Not on file  .  Transportation needs:    Medical: Not on file    Non-medical: Not on file  Tobacco Use  . Smoking status: Current Some Day Smoker    Packs/day: 0.50    Types: Cigarettes  . Smokeless tobacco: Never Used  . Tobacco comment: NEVER USE SNUFF OR CHEWING TOBACCO  Substance and Sexual Activity  . Alcohol use: No  . Drug use: No  . Sexual activity: Yes    Birth control/protection: None, Condom  Lifestyle  . Physical activity:    Days per week: Not on file    Minutes per session: Not on file  . Stress: Not on file  Relationships  . Social connections:    Talks on phone: Not on file    Gets together: Not on file    Attends religious service: Not on file    Active member of club or organization: Not on file    Attends meetings of clubs or organizations: Not on file    Relationship status: Not on file  Other Topics Concern  . Not on file  Social History Narrative   Patient is  right-handed. She is separated, has 4 sons. Is on disabilty for PTSD. She has one caffeine drink daily, walks 3 x week.   Allergies  Allergen Reactions  . Codeine Shortness Of Breath    Unknown Has taken vicodin and percocet without problem per pt  . Peanut-Containing Drug Products Shortness Of Breath  . Sulfa Antibiotics Anaphylaxis and Itching   Family History  Problem Relation Age of Onset  . Cancer Mother 24       breast  . Hypertension Mother   . Depression Mother   . Depression Father   . Seizures Father   . Cancer Maternal Grandmother        breast and cervical  . Asthma Son   . Asthma Son   . Hypertension Other   . Cancer Other         Current Outpatient Medications (Respiratory):  .  fluticasone (FLONASE) 50 MCG/ACT nasal spray, Place 1-2 sprays into both nostrils daily as needed for allergies.  .  montelukast (SINGULAIR) 10 MG tablet, Take 1 tablet (10 mg total) by mouth at bedtime.   Current Outpatient Medications (Analgesics):  .  acetaminophen (TYLENOL) 325 MG tablet, Take 650  mg by mouth every 6 (six) hours as needed for moderate pain or headache. Reported on 05/01/2016 .  ibuprofen (ADVIL,MOTRIN) 200 MG tablet, Take 200 mg by mouth every 6 (six) hours as needed. .  SUMAtriptan (IMITREX) 50 MG tablet, Take 1 tablet earliest onset of headache.  May repeat once in 2 hours if headache persists or recurs.     Current Outpatient Medications (Other):  Marland Kitchen  CALCIUM-MAGNESIUM PO, Take 1 tablet by mouth daily. Marland Kitchen  venlafaxine XR (EFFEXOR-XR) 37.5 MG 24 hr capsule, TAKE 1 CAPSULE (37.5 MG TOTAL) BY MOUTH DAILY WITH BREAKFAST. .  hydrOXYzine (ATARAX/VISTARIL) 10 MG tablet, Take 1 tablet (10 mg total) by mouth 3 (three) times daily as needed.  Current Facility-Administered Medications (Other):  Marland Kitchen  Vitamin D (Ergocalciferol) (DRISDOL) capsule 50,000 Units    Past medical history, social, surgical and family history all reviewed in electronic medical record.  No pertanent information unless stated regarding to the chief complaint.   Review of Systems:  No headache, visual changes, nausea, vomiting, diarrhea, constipation, dizziness, abdominal pain, skin rash, fevers, chills, night sweats, weight loss, swollen lymph nodes, body aches, joint swelling, muscle aches, chest pain, shortness of breath, mood changes.   Objective  Blood pressure (!) 118/98, pulse 87, height 5\' 4"  (1.626 m), weight 173 lb (78.5 kg), SpO2 98 %.    General: No apparent distress alert and oriented x3 mood and affect normal, dressed appropriately.  HEENT: Pupils equal, extraocular movements intact  Respiratory: Patient's speak in full sentences and does not appear short of breath  Cardiovascular: No lower extremity edema, non tender, no erythema  Skin: Warm dry intact with no signs of infection or rash on extremities or on axial skeleton.  Abdomen: Soft nontender  Neuro: Cranial nerves II through XII are intact, neurovascularly intact in all extremities with 2+ DTRs and 2+ pulses.  Lymph: No  lymphadenopathy of posterior or anterior cervical chain or axillae bilaterally.  Gait normal with good balance and coordination.  MSK:  Non tender with full range of motion and good stability and symmetric strength and tone of shoulders, elbows, wrist, hip, knee and ankles bilaterally.  Neck: Inspection unremarkable. No palpable stepoffs. Negative Spurling's maneuver. Increased tightness noted in all planes.  Especially with extension.  Some mild increase in headache with  flexion Grip strength and sensation normal in bilateral hands Strength good C4 to T1 distribution No sensory change to C4 to T1 Negative Hoffman sign bilaterally Reflexes normal   Osteopathic findings C4 flexed rotated and side bent left C6 flexed rotated and side bent left T3 extended rotated and side bent right inhaled third rib T6 extended rotated and side bent left L2 flexed rotated and side bent right Sacrum right on right'   Impression and Recommendations:     This case required medical decision making of moderate complexity. The above documentation has been reviewed and is accurate and complete Judi Saa, DO       Note: This dictation was prepared with Dragon dictation along with smaller phrase technology. Any transcriptional errors that result from this process are unintentional.

## 2018-08-15 NOTE — Assessment & Plan Note (Signed)
History of headaches, cervicogenic headaches, Chiari malformation known.  Patient did respond well to manipulation again.  We discussed that I do think patient is becoming anxious with the realization she may be having surgery in the near future.  Hydroxyzine given.  Discussed continuing other medications.  Awaiting cardiac clearance.  Follow-up again in 6 weeks otherwise if has surgery need to wait 6 to 12 weeks and clearance from neurosurgeon

## 2018-08-15 NOTE — Assessment & Plan Note (Signed)
Discussed with patient.  May do surgical intervention

## 2018-08-15 NOTE — Patient Instructions (Addendum)
Good to see you  Ice 20 minutes 2 times daily. Usually after activity and before bed. Hydroxyzine up to 3 times a day as needed Stay active.  See em again in 1-2 months but if have surgery I will be thinking of you and then see you again 6-8 weeks after surgery

## 2018-08-20 ENCOUNTER — Other Ambulatory Visit (HOSPITAL_COMMUNITY): Payer: Self-pay

## 2018-08-23 DIAGNOSIS — G245 Blepharospasm: Secondary | ICD-10-CM | POA: Diagnosis not present

## 2018-08-26 ENCOUNTER — Encounter (HOSPITAL_COMMUNITY): Payer: Self-pay

## 2018-08-26 ENCOUNTER — Ambulatory Visit (HOSPITAL_COMMUNITY): Payer: Medicare Other

## 2018-08-29 ENCOUNTER — Ambulatory Visit: Payer: Self-pay | Admitting: Neurology

## 2018-08-29 ENCOUNTER — Encounter

## 2018-08-29 ENCOUNTER — Ambulatory Visit (INDEPENDENT_AMBULATORY_CARE_PROVIDER_SITE_OTHER): Payer: Medicare Other

## 2018-08-29 ENCOUNTER — Ambulatory Visit (HOSPITAL_COMMUNITY)
Admission: RE | Admit: 2018-08-29 | Discharge: 2018-08-29 | Disposition: A | Discharge status: Home / Self Care | Payer: Medicare Other | Source: Ambulatory Visit | Attending: Cardiology | Admitting: Cardiology

## 2018-08-29 DIAGNOSIS — R42 Dizziness and giddiness: Secondary | ICD-10-CM | POA: Insufficient documentation

## 2018-08-29 DIAGNOSIS — R002 Palpitations: Secondary | ICD-10-CM | POA: Diagnosis not present

## 2018-08-29 DIAGNOSIS — I071 Rheumatic tricuspid insufficiency: Secondary | ICD-10-CM | POA: Diagnosis not present

## 2018-08-29 DIAGNOSIS — R55 Syncope and collapse: Secondary | ICD-10-CM

## 2018-08-29 NOTE — Progress Notes (Signed)
*  PRELIMINARY RESULTS* Echocardiogram 2D Echocardiogram has been performed.  Stacey DrainWhite, Geremy Rister J 08/29/2018, 12:16 PM

## 2018-08-30 ENCOUNTER — Telehealth: Payer: Self-pay

## 2018-08-30 NOTE — Telephone Encounter (Signed)
Called, LVM with normal results. Gave call back number if any questions.

## 2018-08-30 NOTE — Telephone Encounter (Signed)
-----   Message from Marcelino DusterAngela Nicole Duke, [place] sent at 08/30/2018  8:00 AM EDT ----- Your cardiac ultrasound was normal.

## 2018-09-02 ENCOUNTER — Ambulatory Visit: Payer: Self-pay | Admitting: Cardiovascular Disease

## 2018-09-03 ENCOUNTER — Telehealth: Payer: Self-pay | Admitting: Cardiovascular Disease

## 2018-09-03 NOTE — Telephone Encounter (Signed)
New Message:     STAT if patient feels like he/she is going to faint   1) Are you dizzy now? Yes  2) Do you feel faint or have you passed out? No   3) Do you have any other symptoms? NO   4) Have you checked your HR and BP (record if available)? NO
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
## 2018-09-05 ENCOUNTER — Other Ambulatory Visit: Payer: Self-pay | Admitting: Family Medicine

## 2018-09-05 NOTE — Telephone Encounter (Signed)
Refill done.  

## 2018-09-09 ENCOUNTER — Other Ambulatory Visit: Payer: Self-pay | Admitting: Neurosurgery

## 2018-09-11 DIAGNOSIS — R42 Dizziness and giddiness: Secondary | ICD-10-CM | POA: Diagnosis not present

## 2018-09-11 DIAGNOSIS — R002 Palpitations: Secondary | ICD-10-CM | POA: Diagnosis not present

## 2018-09-11 DIAGNOSIS — R55 Syncope and collapse: Secondary | ICD-10-CM | POA: Diagnosis not present

## 2018-09-13 ENCOUNTER — Encounter (HOSPITAL_COMMUNITY): Payer: Self-pay

## 2018-09-18 ENCOUNTER — Encounter: Payer: Self-pay | Admitting: Cardiovascular Disease

## 2018-09-18 ENCOUNTER — Ambulatory Visit (INDEPENDENT_AMBULATORY_CARE_PROVIDER_SITE_OTHER): Payer: Medicare Other | Admitting: Cardiovascular Disease

## 2018-09-18 VITALS — BP 112/70 | HR 94 | Ht 64.0 in | Wt 170.0 lb

## 2018-09-18 DIAGNOSIS — R42 Dizziness and giddiness: Secondary | ICD-10-CM | POA: Diagnosis not present

## 2018-09-18 DIAGNOSIS — G935 Compression of brain: Secondary | ICD-10-CM

## 2018-09-18 DIAGNOSIS — R002 Palpitations: Secondary | ICD-10-CM | POA: Diagnosis not present

## 2018-09-18 DIAGNOSIS — R55 Syncope and collapse: Secondary | ICD-10-CM | POA: Diagnosis not present

## 2018-09-18 NOTE — Patient Instructions (Signed)
Medication Instructions:  Your physician recommends that you continue on your current medications as directed. Please refer to the Current Medication list given to you today.  If you need a refill on your cardiac medications before your next appointment, please call your pharmacy.   Lab work: NONE  Testing/Procedures: NONE  Follow-Up: At [facility], you and your health needs are our priority.  As part of our continuing mission to provide you with exceptional heart care, we have created designated Provider Care Teams.  These Care Teams include your primary Cardiologist (physician) and Advanced Practice Providers (APPs -  Physician Assistants and Nurse Practitioners) who all work together to provide you with the care you need, when you need it. You will need a follow up appointment in January. DR [facility] or one of the following Advanced Practice Providers on your designated Care Team:   [name], PA-C [place], [place] . [name], PA-C
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
## 2018-09-19 ENCOUNTER — Encounter: Payer: Self-pay | Admitting: Cardiovascular Disease

## 2018-09-20 ENCOUNTER — Inpatient Hospital Stay (HOSPITAL_COMMUNITY): Admission: RE | Admit: 2018-09-20 | Payer: Self-pay | Source: Ambulatory Visit

## 2018-09-24 NOTE — Progress Notes (Signed)
[name] [address] [place], [place] [address] Phone: [phone] Subjective:    I Ronelle Nigh am serving as a Neurosurgeon for Dr. Antoine Primas.   I'm seeing this patient by the request  of:    CC: Back pain  BJY:[id number]  Mary [name] is a 32 y.o. female coming in with complaint of back pain. Back is painful. Not as bad as it has been. States that it is easier to pop.  Patient has been doing very well overall.  Patient does have a Chiari I malformation that seem to be giving her headaches but has responded well to manipulation.  Patient states that the headaches are very minimal.  Has been making progress.  Doing well with the medications.  Has done formal physical therapy.     Past Medical History:  Diagnosis Date  . Asthma   . Bronchitis 01/31/2016  . Depression   . Headache(784.0)   . Mental disorder   . Post traumatic stress disorder (PTSD)   . Pregnant 01/21/2016  . Sinus infection 01/31/2016  . Supervision of normal pregnancy, antepartum 02/08/2016    [facility] at             13+1 week FOB   Kyrstin Campillo 32 yo WM Dating By  LMP and [place] Pap  02/08/16 GC/CT Initial:                36+wks: Genetic Screen NT/IT:  CF screen  Anatomic [place]  Flu vaccine  Tdap Recommended ~ 28wks Glucose Screen  2 hr GBS  Feed Preference  Contraception  Circumcision  Childbirth Classes  Pediatrician     Past Surgical History:  Procedure Laterality Date  . colposcopy    . DILATION AND CURETTAGE OF UTERUS    . nexplanon    . WISDOM TOOTH EXTRACTION     Social History   Socioeconomic History  . Marital status: Legally Separated    Spouse name: Not on file  . Number of children: 4  . Years of education: Not on file  . Highest education level: GED or equivalent  Occupational History  . Occupation: disabled    Comment: PTSD  Social Needs  . Financial resource strain: Not on file  . Food insecurity:    Worry: Not on file   Inability: Not on file  . Transportation needs:    Medical: Not on file    Non-medical: Not on file  Tobacco Use  . Smoking status: Current Some Day Smoker    Packs/day: 0.50    Types: Cigarettes  . Smokeless tobacco: Never Used  . Tobacco comment: NEVER USE SNUFF OR CHEWING TOBACCO  Substance and Sexual Activity  . Alcohol use: No  . Drug use: No  . Sexual activity: Yes    Birth control/protection: None, Condom  Lifestyle  . Physical activity:    Days per week: Not on file    Minutes per session: Not on file  . Stress: Not on file  Relationships  . Social connections:    Talks on phone: Not on file    Gets together: Not on file    Attends religious service: Not on file    Active member of club or organization: Not on file    Attends meetings of clubs or organizations: Not on file    Relationship status: Not on file  Other Topics Concern  . Not on file  Social History Narrative  Patient is right-handed. She is separated, has 4 sons. Is on disabilty for PTSD. She has one caffeine drink daily, walks 3 x week.   Allergies  Allergen Reactions  . Codeine Shortness Of Breath and Other (See Comments)    Can not take in liquid form but does not recall exactly what formulation she had Has taken vicodin and percocet without problem per pt  . Peanut-Containing Drug Products Anaphylaxis  . Sulfa Antibiotics Anaphylaxis and Itching   Family History  Problem Relation Age of Onset  . Cancer Mother 69       breast  . Hypertension Mother   . Depression Mother   . Depression Father   . Seizures Father   . Cancer Maternal Grandmother        breast and cervical  . Asthma Son   . Asthma Son   . Hypertension Other   . Cancer Other         Current Outpatient Medications (Respiratory):  .  fluticasone (FLONASE) 50 MCG/ACT nasal spray, Place 1-2 sprays into both nostrils daily as needed for allergies.  .  montelukast (SINGULAIR) 10 MG tablet, Take 1 tablet (10 mg total) by  mouth at bedtime.   Current Outpatient Medications (Analgesics):  .  acetaminophen (TYLENOL) 325 MG tablet, Take 650 mg by mouth every 6 (six) hours as needed for moderate pain or headache. Reported on 05/01/2016 .  ibuprofen (ADVIL,MOTRIN) 200 MG tablet, Take 200 mg by mouth every 6 (six) hours as needed. .  SUMAtriptan (IMITREX) 50 MG tablet, Take 1 tablet earliest onset of headache.  May repeat once in 2 hours if headache persists or recurs.     Current Outpatient Medications (Other):  Marland Kitchen  CALCIUM-MAGNESIUM PO, Take 1 tablet by mouth daily. .  hydrOXYzine (ATARAX/VISTARIL) 10 MG tablet, Take 1 tablet (10 mg total) by mouth 3 (three) times daily as needed. .  venlafaxine XR (EFFEXOR-XR) 37.5 MG 24 hr capsule, TAKE 1 CAPSULE (37.5 MG TOTAL) BY MOUTH DAILY WITH BREAKFAST.  Current Facility-Administered Medications (Other):  Marland Kitchen  Vitamin D (Ergocalciferol) (DRISDOL) capsule 50,000 Units    Past medical history, social, surgical and family history all reviewed in electronic medical record.  No pertanent information unless stated regarding to the chief complaint.   Review of Systems:  No headache, visual changes, nausea, vomiting, diarrhea, constipation, dizziness, abdominal pain, skin rash, fevers, chills, night sweats, weight loss, swollen lymph nodes, body aches, joint swelling,  chest pain, shortness of breath, mood changes.  Positive muscle aches, headaches   Objective  Blood pressure 100/80, pulse 96, height 5\' 4"  (1.626 m), weight 169 lb (76.7 kg), SpO2 98 %.   General: No apparent distress alert and oriented x3 mood and affect normal, dressed appropriately.  HEENT: Pupils equal, extraocular movements intact  Respiratory: Patient's speak in full sentences and does not appear short of breath  Cardiovascular: No lower extremity edema, non tender, no erythema  Skin: Warm dry intact with no signs of infection or rash on extremities or on axial skeleton.  Abdomen: Soft nontender  Neuro:  Cranial nerves II through XII are intact, neurovascularly intact in all extremities with 2+ DTRs and 2+ pulses.  Lymph: No lymphadenopathy of posterior or anterior cervical chain or axillae bilaterally.  Gait normal with good balance and coordination.  MSK:  Non tender with full range of motion and good stability and symmetric strength and tone of shoulders, elbows, wrist, hip, knee and ankles bilaterally.  Neck: Inspection mild loss of lordosis.  No palpable stepoffs. Negative Spurling's maneuver. Limited side bending bilaterally Grip strength and sensation normal in bilateral hands Strength good C4 to T1 distribution No sensory change to C4 to T1 Negative Hoffman sign bilaterally Reflexes normal Tightness of the trapezius bilaterally  Osteopathic findings C2 flexed rotated and side bent right C6 flexed rotated and side bent left T3 extended rotated and side bent right inhaled third rib T7 extended rotated and side bent left L4 flexed rotated and side bent left Sacrum right on right    Impression and Recommendations:     This case required medical decision making of moderate complexity. The above documentation has been reviewed and is accurate and complete Judi Saa, DO       Note: This dictation was prepared with Dragon dictation along with smaller phrase technology. Any transcriptional errors that result from this process are unintentional.

## 2018-09-26 ENCOUNTER — Encounter: Payer: Self-pay | Admitting: Family Medicine

## 2018-09-26 ENCOUNTER — Ambulatory Visit (INDEPENDENT_AMBULATORY_CARE_PROVIDER_SITE_OTHER): Payer: Medicare Other | Admitting: Family Medicine

## 2018-09-26 VITALS — BP 100/80 | HR 96 | Ht 64.0 in | Wt 169.0 lb

## 2018-09-26 DIAGNOSIS — M9908 Segmental and somatic dysfunction of rib cage: Secondary | ICD-10-CM

## 2018-09-26 DIAGNOSIS — M9901 Segmental and somatic dysfunction of cervical region: Secondary | ICD-10-CM | POA: Diagnosis not present

## 2018-09-26 DIAGNOSIS — M9903 Segmental and somatic dysfunction of lumbar region: Secondary | ICD-10-CM | POA: Diagnosis not present

## 2018-09-26 DIAGNOSIS — M9904 Segmental and somatic dysfunction of sacral region: Secondary | ICD-10-CM

## 2018-09-26 DIAGNOSIS — M9902 Segmental and somatic dysfunction of thoracic region: Secondary | ICD-10-CM

## 2018-09-26 DIAGNOSIS — G44201 Tension-type headache, unspecified, intractable: Secondary | ICD-10-CM | POA: Diagnosis not present

## 2018-09-26 DIAGNOSIS — M999 Biomechanical lesion, unspecified: Secondary | ICD-10-CM

## 2018-09-26 NOTE — Patient Instructions (Addendum)
Good to see you  Ice is your friend Stay active You are doing great  See me again I n3 months

## 2018-09-26 NOTE — Assessment & Plan Note (Signed)
Stable.  Discussed icing regimen and home exercise.  Discussed which activities to do which wants to avoid.  We discussed posture and ergonomics.  Patient will continue with the medications that she is doing at this moment.  Follow-up with me again in 4 to 8 weeks

## 2018-09-26 NOTE — Assessment & Plan Note (Signed)
Decision today to treat with OMT was based on Physical Exam  After verbal consent patient was treated with HVLA, ME, FPR techniques in cervical, thoracic, rib, lumbar and sacral areas  Patient tolerated the procedure well with improvement in symptoms  Patient given exercises, stretches and lifestyle modifications  See medications in patient instructions if given  Patient will follow up in 8 weeks 

## 2018-10-17 DIAGNOSIS — G935 Compression of brain: Secondary | ICD-10-CM | POA: Diagnosis not present

## 2018-10-18 ENCOUNTER — Inpatient Hospital Stay (HOSPITAL_COMMUNITY): Admission: RE | Admit: 2018-10-18 | Payer: Self-pay | Source: Ambulatory Visit

## 2018-10-25 NOTE — Pre-Procedure Instructions (Signed)
Mary [name]  10/25/2018      [id number] - [place], [place] - [address] [facility] [address] [place] [id number] Phone: [phone] Fax: [phone]    Your procedure is scheduled on November 25th.  [facility] at 8:00 A.M.  Call this number if you have problems the morning of surgery:  [phone]   Remember:  Do not eat or drink after midnight.     Take these medicines the morning of surgery with A SIP OF WATER   Tylenol - if needed  Flonase - if needed   7 days prior to surgery STOP taking any Aspirin(unless otherwise instructed by your surgeon), Aleve, Naproxen, Ibuprofen, Motrin, Advil, Goody's, BC's, all herbal medications, fish oil, and all vitamins    Do not wear jewelry, make-up or nail polish.  Do not wear lotions, powders, or perfumes, or deodorant.  Do not shave 48 hours prior to surgery.  Men may shave face and neck.  Do not bring valuables to the hospital.  [facility] is not [facility] any belongings or valuables.   [employer]  Before surgery, you can play an important role. Because skin is not sterile, your skin needs to be as free of germs as possible. You can reduce the number of germs on your skin by washing with CHG (chlorahexidine gluconate) Soap before surgery.  CHG is an antiseptic cleaner which kills germs and bonds with the skin to continue killing germs even after washing.    Oral Hygiene is also important to reduce your risk of infection.  Remember - BRUSH YOUR TEETH THE MORNING OF SURGERY WITH YOUR REGULAR TOOTHPASTE  Please do not use if you have an allergy to CHG or antibacterial soaps. If your skin becomes reddened/irritated stop using the CHG.  Do not shave (including legs and underarms) for at least 48 hours prior to first CHG shower. It is OK to shave your face.  Please follow these instructions carefully.   1. Shower the NIGHT  BEFORE SURGERY and the MORNING OF SURGERY with CHG.   2. If you chose to wash your hair, wash your hair first as usual with your normal shampoo.  3. After you shampoo, rinse your hair and body thoroughly to remove the shampoo.  4. Use CHG as you would any other liquid soap. You can apply CHG directly to the skin and wash gently with a scrungie or a clean washcloth.   5. Apply the CHG Soap to your body ONLY FROM THE NECK DOWN.  Do not use on open wounds or open sores. Avoid contact with your eyes, ears, mouth and genitals (private parts). Wash Face and genitals (private parts)  with your normal soap.  6. Wash thoroughly, paying special attention to the area where your surgery will be performed.  7. Thoroughly rinse your body with warm water from the neck down.  8. DO NOT shower/wash with your normal soap after using and rinsing off the CHG Soap.  9. Pat yourself dry with a CLEAN TOWEL.  10. Wear CLEAN PAJAMAS to bed the night before surgery, wear comfortable clothes the morning of surgery  11. Place CLEAN SHEETS on your bed the night of your first shower and DO NOT SLEEP WITH PETS.   Day of Surgery:  Do not apply any deodorants/lotions.  Please wear clean clothes to the hospital/surgery center.   Remember to brush  your teeth WITH YOUR REGULAR TOOTHPASTE.   Contacts, dentures or bridgework may not be worn into surgery.  Leave your suitcase in the car.  After surgery it may be brought to your room.  For patients admitted to the hospital, discharge time will be determined by your treatment team.  Patients discharged the day of surgery will not be allowed to drive home.   Please read over the following fact sheets that you were given. Coughing and Deep Breathing, MRSA Information and Surgical Site Infection Prevention

## 2018-10-28 ENCOUNTER — Inpatient Hospital Stay (HOSPITAL_COMMUNITY)
Admission: RE | Admit: 2018-10-28 | Discharge: 2018-10-28 | Disposition: A | Discharge status: Home / Self Care | Payer: Self-pay | Source: Ambulatory Visit

## 2018-10-28 DIAGNOSIS — J208 Acute bronchitis due to other specified organisms: Secondary | ICD-10-CM | POA: Diagnosis not present

## 2018-11-28 ENCOUNTER — Ambulatory Visit: Payer: Medicare Other | Admitting: Family Medicine

## 2018-11-28 NOTE — Progress Notes (Deleted)
Tawana ScaleZach Madalyn Legner D.O. [facility] [address] [place], [place] [address] Phone: [phone] Subjective:    I'm seeing this patient by the request  of:    CC:   KGM:[id number]:Subjective  Mary [name] is a 32 y.o. female coming in with complaint of ***  Onset-  Location Duration-  Character- Aggravating factors- Reliving factors-  Therapies tried-  Severity-     Past Medical History:  Diagnosis Date  . Asthma   . Bronchitis 01/31/2016  . Depression   . Headache(784.0)   . Mental disorder   . Post traumatic stress disorder (PTSD)   . Pregnant 01/21/2016  . Sinus infection 01/31/2016  . Supervision of normal pregnancy, antepartum 02/08/2016    [facility] at             13+1 week FOB   Fabienne BrunsMichael Tullos 32 yo WM Dating By  LMP and US Pap  02/08/16 GC/CT Initial:                36+wks: Genetic Screen NT/IT:  CF screen  Anatomic US  Flu vaccine  Tdap Recommended ~ 28wks Glucose Screen  2 hr GBS  Feed Preference  Contraception  Circumcision  Childbirth Classes  Pediatrician     Past Surgical History:  Procedure Laterality Date  . colposcopy    . DILATION AND CURETTAGE OF UTERUS    . nexplanon    . WISDOM TOOTH EXTRACTION     Social History   Socioeconomic History  . Marital status: Legally Separated    Spouse name: Not on file  . Number of children: 4  . Years of education: Not on file  . Highest education level: GED or equivalent  Occupational History  . Occupation: disabled    Comment: PTSD  Social Needs  . Financial resource strain: Not on file  . Food insecurity:    Worry: Not on file    Inability: Not on file  . Transportation needs:    Medical: Not on file    Non-medical: Not on file  Tobacco Use  . Smoking status: Current Some Day Smoker    Packs/day: 0.50    Types: Cigarettes  . Smokeless tobacco: Never Used  . Tobacco comment: NEVER USE SNUFF OR CHEWING TOBACCO  Substance and Sexual Activity  . Alcohol use: No  . Drug use: No    . Sexual activity: Yes    Birth control/protection: None, Condom  Lifestyle  . Physical activity:    Days per week: Not on file    Minutes per session: Not on file  . Stress: Not on file  Relationships  . Social connections:    Talks on phone: Not on file    Gets together: Not on file    Attends religious service: Not on file    Active member of club or organization: Not on file    Attends meetings of clubs or organizations: Not on file    Relationship status: Not on file  Other Topics Concern  . Not on file  Social History Narrative   Patient is right-handed. She is separated, has 4 sons. Is on disabilty for PTSD. She has one caffeine drink daily, walks 3 x week.   Allergies  Allergen Reactions  . Codeine Shortness Of Breath and Other (See Comments)    Can not take in liquid form but does not recall exactly what formulation she had Has taken vicodin and percocet without problem per pt  .  Peanut-Containing Drug Products Anaphylaxis  . Sulfa Antibiotics Anaphylaxis and Itching   Family History  Problem Relation Age of Onset  . Cancer Mother [id number]       breast  . Hypertension Mother   . Depression Mother   . Depression Father   . Seizures Father   . Cancer Maternal Grandmother        breast and cervical  . Asthma Son   . Asthma Son   . Hypertension Other   . Cancer Other         Current Outpatient Medications (Respiratory):  .  fluticasone (FLONASE) 50 MCG/ACT nasal spray, Place 2 sprays into both nostrils daily.  .  montelukast (SINGULAIR) 10 MG tablet, Take 1 tablet (10 mg total) by mouth at bedtime. (Patient not taking: Reported on 10/15/2018)   Current Outpatient Medications (Analgesics):  .  acetaminophen (TYLENOL) 325 MG tablet, Take 650 mg by mouth every 6 (six) hours as needed for moderate pain or headache. Reported on 05/01/2016 .  SUMAtriptan (IMITREX) 50 MG tablet, Take 1 tablet earliest onset of headache.  May repeat once in 2 hours if headache persists  or recurs. (Patient not taking: Reported on 10/15/2018)     Current Outpatient Medications (Other):  .  hydrOXYzine (ATARAX/VISTARIL) 10 MG tablet, Take 1 tablet (10 mg total) by mouth 3 (three) times daily as needed. .  venlafaxine XR (EFFEXOR-XR) 37.5 MG 24 hr capsule, TAKE 1 CAPSULE (37.5 MG TOTAL) BY MOUTH DAILY WITH BREAKFAST. (Patient taking differently: Take 37.5 mg by mouth daily with lunch. )  Current Facility-Administered Medications (Other):  Marland Kitchen.  Vitamin D (Ergocalciferol) (DRISDOL) capsule 50,000 Units    Past medical history, social, surgical and family history all reviewed in electronic medical record.  No pertanent information unless stated regarding to the chief complaint.   Review of Systems:  No headache, visual changes, nausea, vomiting, diarrhea, constipation, dizziness, abdominal pain, skin rash, fevers, chills, night sweats, weight loss, swollen lymph nodes, body aches, joint swelling, muscle aches, chest pain, shortness of breath, mood changes.   Objective  There were no vitals taken for this visit. Systems examined below as of    General: No apparent distress alert and oriented x3 mood and affect normal, dressed appropriately.  HEENT: Pupils equal, extraocular movements intact  Respiratory: Patient's speak in full sentences and does not appear short of breath  Cardiovascular: No lower extremity edema, non tender, no erythema  Skin: Warm dry intact with no signs of infection or rash on extremities or on axial skeleton.  Abdomen: Soft nontender  Neuro: Cranial nerves II through XII are intact, neurovascularly intact in all extremities with 2+ DTRs and 2+ pulses.  Lymph: No lymphadenopathy of posterior or anterior cervical chain or axillae bilaterally.  Gait normal with good balance and coordination.  MSK:  Non tender with full range of motion and good stability and symmetric strength and tone of shoulders, elbows, wrist, hip, knee and ankles bilaterally.      Impression and Recommendations:     This case required medical decision making of moderate complexity. The above documentation has been reviewed and is accurate and complete Mary SaaZachary M Carlyann Placide, DO       Note: This dictation was prepared with Dragon dictation along with smaller phrase technology. Any transcriptional errors that result from this process are unintentional.

## 2018-12-01 NOTE — Progress Notes (Signed)
Tawana ScaleZach Patrice Matthew D.O. [facility] [address] [place], [place] [address] Phone: [phone] Subjective:   Bruce Donath, Valerie Wolf, am serving as a scribe for Dr. Antoine PrimasZachary Khadijatou Borak.   CC: Back pain follow-up, headache follow-up  BJY:NWGNFAOZHYHPI:Subjective  Mary [name] is a 32 y.o. female coming in with complaint of back pain. She is here today for OMT. Tightness in her low back through cervical spine. She is having surgery in early January for cervical decompression. Patient would like to discuss options of wearing a cervical collar for 3 months before having surgery. Patient also wants to know if she doesn't have surgery what is quality of life.  Patient Mary [name] is concerned because she is the primary caregiver for her 4 children.  Patient is not getting along with her husband at the moment either.  Patient's mother lives away but may be coming in if she does go through with the surgical intervention.     Past Medical History:  Diagnosis Date  . Asthma   . Bronchitis 01/31/2016  . Depression   . Headache(784.0)   . Mental disorder   . Post traumatic stress disorder (PTSD)   . Pregnant 01/21/2016  . Sinus infection 01/31/2016  . Supervision of normal pregnancy, antepartum 02/08/2016    [facility] at             13+1 week FOB   Fabienne BrunsMichael Stach 32 yo WM Dating By  LMP and US Pap  02/08/16 GC/CT Initial:                36+wks: Genetic Screen NT/IT:  CF screen  Anatomic US  Flu vaccine  Tdap Recommended ~ 28wks Glucose Screen  2 hr GBS  Feed Preference  Contraception  Circumcision  Childbirth Classes  Pediatrician     Past Surgical History:  Procedure Laterality Date  . colposcopy    . DILATION AND CURETTAGE OF UTERUS    . nexplanon    . WISDOM TOOTH EXTRACTION     Social History   Socioeconomic History  . Marital status: Legally Separated    Spouse name: Not on file  . Number of children: 4  . Years of education: Not on file  . Highest education level: GED or equivalent    Occupational History  . Occupation: disabled    Comment: PTSD  Social Needs  . Financial resource strain: Not on file  . Food insecurity:    Worry: Not on file    Inability: Not on file  . Transportation needs:    Medical: Not on file    Non-medical: Not on file  Tobacco Use  . Smoking status: Current Some Day Smoker    Packs/day: 0.50    Types: Cigarettes  . Smokeless tobacco: Never Used  . Tobacco comment: NEVER USE SNUFF OR CHEWING TOBACCO  Substance and Sexual Activity  . Alcohol use: No  . Drug use: No  . Sexual activity: Yes    Birth control/protection: None, Condom  Lifestyle  . Physical activity:    Days per week: Not on file    Minutes per session: Not on file  . Stress: Not on file  Relationships  . Social connections:    Talks on phone: Not on file    Gets together: Not on file    Attends religious service: Not on file    Active member of club or organization: Not on file    Attends meetings of clubs or organizations: Not on  file    Relationship status: Not on file  Other Topics Concern  . Not on file  Social History Narrative   Patient is right-handed. She is separated, has 4 sons. Is on disabilty for PTSD. She has one caffeine drink daily, walks 3 x week.   Allergies  Allergen Reactions  . Codeine Shortness Of Breath and Other (See Comments)    Can not take in liquid form but does not recall exactly what formulation she had Has taken vicodin and percocet without problem per pt  . Peanut-Containing Drug Products Anaphylaxis  . Sulfa Antibiotics Anaphylaxis and Itching   Family History  Problem Relation Age of Onset  . Cancer Mother 38       breast  . Hypertension Mother   . Depression Mother   . Depression Father   . Seizures Father   . Cancer Maternal Grandmother        breast and cervical  . Asthma Son   . Asthma Son   . Hypertension Other   . Cancer Other         Current Outpatient Medications (Respiratory):  .  fluticasone  (FLONASE) 50 MCG/ACT nasal spray, Place 2 sprays into both nostrils daily.  .  montelukast (SINGULAIR) 10 MG tablet, Take 1 tablet (10 mg total) by mouth at bedtime.   Current Outpatient Medications (Analgesics):  .  acetaminophen (TYLENOL) 325 MG tablet, Take 650 mg by mouth every 6 (six) hours as needed for moderate pain or headache. Reported on 05/01/2016 .  SUMAtriptan (IMITREX) 50 MG tablet, Take 1 tablet earliest onset of headache.  May repeat once in 2 hours if headache persists or recurs.     Current Outpatient Medications (Other):  .  hydrOXYzine (ATARAX/VISTARIL) 10 MG tablet, Take 1 tablet (10 mg total) by mouth 3 (three) times daily as needed. .  venlafaxine XR (EFFEXOR-XR) 37.5 MG 24 hr capsule, TAKE 1 CAPSULE (37.5 MG TOTAL) BY MOUTH DAILY WITH BREAKFAST. (Patient taking differently: Take 37.5 mg by mouth daily with lunch. )  Current Facility-Administered Medications (Other):  Marland Kitchen  Vitamin D (Ergocalciferol) (DRISDOL) capsule 50,000 Units    Past medical history, social, surgical and family history all reviewed in electronic medical record.  No pertanent information unless stated regarding to the chief complaint.   Review of Systems:  No headache, visual changes, nausea, vomiting, diarrhea, constipation, dizziness, abdominal pain, skin rash, fevers, chills, night sweats, weight loss, swollen lymph nodes, body aches, joint swelling,  chest pain, shortness of breath, mood changes.  Positive muscle aches  Objective  Blood pressure 118/86, pulse 90, height 5\' 4"  (1.626 m), weight 173 lb (78.5 kg), SpO2 99 %.   General: No apparent distress alert and oriented x3 mood and affect normal, dressed appropriately.  HEENT: Pupils equal, extraocular movements intact  Respiratory: Patient's speak in full sentences and does not appear short of breath  Cardiovascular: No lower extremity edema, non tender, no erythema  Skin: Warm dry intact with no signs of infection or rash on extremities  or on axial skeleton.  Abdomen: Soft nontender  Neuro: Cranial nerves II through XII are intact, neurovascularly intact in all extremities with 2+ DTRs and 2+ pulses.  Lymph: No lymphadenopathy of posterior or anterior cervical chain or axillae bilaterally.  Gait normal with good balance and coordination.  MSK:  Non tender with full range of motion and good stability and symmetric strength and tone of shoulders, elbows, wrist, hip, knee and ankles bilaterally.  Neck: Inspection loss lordosis.  No palpable stepoffs. Negative Spurling's maneuver. Full neck range of motion Grip strength and sensation normal in bilateral hands Strength good C4 to T1 distribution No sensory change to C4 to T1 Negative Hoffman sign bilaterally Reflexes normal tightness of the trapezius bilaterally  Low back exam shows loss of lordosis.  Patient does have positive Pearlean BrownieFaber on the right greater than left negative straight leg test.  No spinous process tenderness  Osteopathic findings  T3 extended rotated and side bent right inhaled third rib T5 extended rotated and side bent left L3 flexed rotated and side bent right Sacrum right on right    Impression and Recommendations:     This case required medical decision making of moderate complexity. The above documentation has been reviewed and is accurate and complete Judi SaaZachary M Oaklan Persons, DO       Note: This dictation was prepared with Dragon dictation along with smaller phrase technology. Any transcriptional errors that result from this process are unintentional.

## 2018-12-02 ENCOUNTER — Encounter: Payer: Self-pay | Admitting: Family Medicine

## 2018-12-02 ENCOUNTER — Ambulatory Visit (INDEPENDENT_AMBULATORY_CARE_PROVIDER_SITE_OTHER): Payer: Medicare Other | Admitting: Family Medicine

## 2018-12-02 VITALS — BP 118/86 | HR 90 | Ht 64.0 in | Wt 173.0 lb

## 2018-12-02 DIAGNOSIS — M999 Biomechanical lesion, unspecified: Secondary | ICD-10-CM | POA: Diagnosis not present

## 2018-12-02 DIAGNOSIS — G935 Compression of brain: Secondary | ICD-10-CM

## 2018-12-02 DIAGNOSIS — M9904 Segmental and somatic dysfunction of sacral region: Secondary | ICD-10-CM | POA: Diagnosis not present

## 2018-12-02 DIAGNOSIS — M9902 Segmental and somatic dysfunction of thoracic region: Secondary | ICD-10-CM

## 2018-12-02 DIAGNOSIS — M9903 Segmental and somatic dysfunction of lumbar region: Secondary | ICD-10-CM | POA: Diagnosis not present

## 2018-12-02 NOTE — Assessment & Plan Note (Signed)
Discussed with patient again in great length.  Patient is leaning towards possible surgical intervention.  We discussed with her if it is already affecting quality of life and she is not making any improvement with her conservative therapy she should consider this.  Patient will be following up with neurosurgery to discuss further.  We did do manipulation for the rest of her problems.  Discussed which activities to do which was to avoid.  Discussed topical anti-inflammatories.  Follow-up again in 4 to 8 weeks

## 2018-12-02 NOTE — Patient Instructions (Signed)
Good to see you  I think the surgery is not a bad idea.  COntinue everything else See me again  Right before the surgery and we will do one last manipulation

## 2018-12-02 NOTE — Assessment & Plan Note (Signed)
Decision today to treat with OMT was based on Physical Exam  After verbal consent patient was treated with HVLA, ME, FPR techniques in  thoracic, lumbar and sacral areas  Patient tolerated the procedure well with improvement in symptoms  Patient given exercises, stretches and lifestyle modifications  See medications in patient instructions if given  Patient will follow up in 4-6 weeks 

## 2018-12-09 NOTE — Pre-Procedure Instructions (Signed)
Mary [name]  12/09/2018      [id number] - [place], [place] - [address] [facility] [place] [address] Phone: [phone] Fax: [phone]    Your procedure is scheduled on January 6  [facility] at 0800 A.M.  Call this number if you have problems the morning of surgery:  [phone]   Remember:  Do not eat or drink after midnight.     Take these medicines the morning of surgery with A SIP OF WATER  fluticasone (FLONASE) if needed venlafaxine XR (EFFEXOR-XR)  Tylenol if needed Imitrex if needed for headache Hydroxyzine if needed  7 days prior to surgery STOP taking any Aspirin (unless otherwise instructed by your surgeon), Aleve, Naproxen, Ibuprofen, Motrin, Advil, Goody's, BC's, all herbal medications, fish oil, and all vitamins.     Do not wear jewelry, make-up or nail polish.  Do not wear lotions, powders, or perfumes, or deodorant.  Do not shave 48 hours prior to surgery.    Do not bring valuables to the hospital.  [facility] is not responsible for any belongings or valuables.  Contacts, dentures or bridgework may not be worn into surgery.  Leave your suitcase in the car.  After surgery it may be brought to your room.  For patients admitted to the hospital, discharge time will be determined by your treatment team.  Patients discharged the day of surgery will not be allowed to drive home.    Special instructions:   [facility]- Preparing For Surgery  Before surgery, you can play an important role. Because skin is not sterile, your skin needs to be as free of germs as possible. You can reduce the number of germs on your skin by washing with CHG (chlorahexidine gluconate) Soap before surgery.  CHG is an antiseptic cleaner which kills germs and bonds with the skin to continue killing germs even after washing.    Oral Hygiene is also important to reduce your risk of infection.   Remember - BRUSH YOUR TEETH THE MORNING OF SURGERY WITH YOUR REGULAR TOOTHPASTE  Please do not use if you have an allergy to CHG or antibacterial soaps. If your skin becomes reddened/irritated stop using the CHG.  Do not shave (including legs and underarms) for at least 48 hours prior to first CHG shower. It is OK to shave your face.  Please follow these instructions carefully.   1. Shower the NIGHT BEFORE SURGERY and the MORNING OF SURGERY with CHG.   2. If you chose to wash your hair, wash your hair first as usual with your normal shampoo.  3. After you shampoo, rinse your hair and body thoroughly to remove the shampoo.  4. Use CHG as you would any other liquid soap. You can apply CHG directly to the skin and wash gently with a scrungie or a clean washcloth.   5. Apply the CHG Soap to your body ONLY FROM THE NECK DOWN.  Do not use on open wounds or open sores. Avoid contact with your eyes, ears, mouth and genitals (private parts). Wash Face and genitals (private parts)  with your normal soap.  6. Wash thoroughly, paying special attention to the area where your surgery will be performed.  7. Thoroughly rinse your body with warm water from the neck down.  8. DO NOT shower/wash with your normal soap after using and rinsing off the CHG Soap.  9. Pat yourself  dry with a CLEAN TOWEL.  10. Wear CLEAN PAJAMAS to bed the night before surgery, wear comfortable clothes the morning of surgery  11. Place CLEAN SHEETS on your bed the night of your first shower and DO NOT SLEEP WITH PETS.    Day of Surgery:  Do not apply any deodorants/lotions.  Please wear clean clothes to the hospital/surgery center.   Remember to brush your teeth WITH YOUR REGULAR TOOTHPASTE.    Please read over the following fact sheets that you were given.

## 2018-12-09 NOTE — Progress Notes (Signed)
Tawana ScaleZach  D.O. [facility] [address] [place], [place] [address] Phone: [phone] Subjective:   Mary [name], Valerie Wolf, am serving as a scribe for Dr. Antoine PrimasZachary .  I'm seeing this patient by the request  of:    CC: Neck and back pain follow-up  WUX:[id number]:Subjective  Mary [name] is a 32 y.o. female coming in with complaint of back pain. She is here today for OMT before her surgery as she will not be able to come back in for a few weeks.  Patient does have a Chiari I malformation.  Undergoing decompression surgery on the sixth.     Past Medical History:  Diagnosis Date  . Asthma   . Bronchitis 01/31/2016  . Chiari malformation type I (HCC)   . Depression   . GERD (gastroesophageal reflux disease)   . H/O multiple concussions   . Headache(784.0)   . History of palpitations   . Hx of fracture of nose   . Mental disorder   . Multiple fractures    Hx: of a leg and arm fracture as a child  . Pneumonia   . PONV (postoperative nausea and vomiting)   . Post traumatic stress disorder (PTSD)   . Pregnant 01/21/2016  . PTSD (post-traumatic stress disorder)   . Sinus infection 01/31/2016  . Supervision of normal pregnancy, antepartum 02/08/2016    [facility] at             13+1 week FOB   Fabienne BrunsMichael Portnoy 32 yo WM Dating By  LMP and US Pap  02/08/16 GC/CT Initial:                36+wks: Genetic Screen NT/IT:  CF screen  Anatomic US  Flu vaccine  Tdap Recommended ~ 28wks Glucose Screen  2 hr GBS  Feed Preference  Contraception  Circumcision  Childbirth Classes  Pediatrician     Past Surgical History:  Procedure Laterality Date  . botox injections    . colposcopy    . DILATION AND CURETTAGE OF UTERUS    . nexplanon    . WISDOM TOOTH EXTRACTION     Social History   Socioeconomic History  . Marital status: Legally Separated    Spouse name: Not on file  . Number of children: 4  . Years of education: Not on file  . Highest education level: GED or  equivalent  Occupational History  . Occupation: disabled    Comment: PTSD  Social Needs  . Financial resource strain: Not on file  . Food insecurity:    Worry: Not on file    Inability: Not on file  . Transportation needs:    Medical: Not on file    Non-medical: Not on file  Tobacco Use  . Smoking status: Current Every Day Smoker    Packs/day: 0.50    Types: Cigarettes  . Smokeless tobacco: Never Used  . Tobacco comment: NEVER USE SNUFF OR CHEWING TOBACCO  Substance and Sexual Activity  . Alcohol use: Yes    Comment: social  . Drug use: No  . Sexual activity: Yes    Birth control/protection: None, Condom  Lifestyle  . Physical activity:    Days per week: Not on file    Minutes per session: Not on file  . Stress: Not on file  Relationships  . Social connections:    Talks on phone: Not on file    Gets together: Not on file    Attends religious  service: Not on file    Active member of club or organization: Not on file    Attends meetings of clubs or organizations: Not on file    Relationship status: Not on file  Other Topics Concern  . Not on file  Social History Narrative   Patient is right-handed. She is separated, has 4 sons. Is on disabilty for PTSD. She has one caffeine drink daily, walks 3 x week.   Allergies  Allergen Reactions  . Codeine Shortness Of Breath and Other (See Comments)    Can not take in liquid form but does not recall exactly what formulation she had Has taken vicodin and percocet without problem per pt  . Peanut-Containing Drug Products Anaphylaxis  . Sulfa Antibiotics Anaphylaxis and Itching   Family History  Problem Relation Age of Onset  . Cancer Mother 38       breast  . Hypertension Mother   . Depression Mother   . Depression Father   . Seizures Father   . Cancer Maternal Grandmother        breast and cervical  . Asthma Son   . Asthma Son   . Hypertension Other   . Cancer Other         Current Outpatient Medications  (Respiratory):  .  fluticasone (FLONASE) 50 MCG/ACT nasal spray, Place 2 sprays into both nostrils daily.  .  montelukast (SINGULAIR) 10 MG tablet, Take 1 tablet (10 mg total) by mouth at bedtime.   Current Outpatient Medications (Analgesics):  .  acetaminophen (TYLENOL) 325 MG tablet, Take 650 mg by mouth every 6 (six) hours as needed for moderate pain or headache. Reported on 05/01/2016 .  SUMAtriptan (IMITREX) 50 MG tablet, Take 1 tablet earliest onset of headache.  May repeat once in 2 hours if headache persists or recurs.     Current Outpatient Medications (Other):  .  hydrOXYzine (ATARAX/VISTARIL) 10 MG tablet, Take 1 tablet (10 mg total) by mouth 3 (three) times daily as needed. .  venlafaxine XR (EFFEXOR-XR) 37.5 MG 24 hr capsule, TAKE 1 CAPSULE (37.5 MG TOTAL) BY MOUTH DAILY WITH BREAKFAST. (Patient taking differently: Take 37.5 mg by mouth daily with lunch. )  Current Facility-Administered Medications (Other):  Marland Kitchen  Vitamin D (Ergocalciferol) (DRISDOL) capsule 50,000 Units    Past medical history, social, surgical and family history all reviewed in electronic medical record.  No pertanent information unless stated regarding to the chief complaint.   Review of Systems:  No  visual changes, nausea, vomiting, diarrhea, constipation, dizziness, abdominal pain, skin rash, fevers, chills, night sweats, weight loss, swollen lymph nodes, body aches, joint swelling, chest pain, shortness of breath, mood changes.  Positive headaches, muscle aches  Objective  Blood pressure 110/80, pulse 96, height 5\' 4"  (1.626 m), weight 176 lb (79.8 kg), last menstrual period 11/22/2018, SpO2 99 %.   General: No apparent distress alert and oriented x3 mood and affect normal, dressed appropriately.  HEENT: Pupils equal, extraocular movements intact  Respiratory: Patient's speak in full sentences and does not appear short of breath  Cardiovascular: No lower extremity edema, non tender, no erythema  Skin:  Warm dry intact with no signs of infection or rash on extremities or on axial skeleton.  Abdomen: Soft nontender  Neuro: Cranial nerves II through XII are intact, neurovascularly intact in all extremities with 2+ DTRs and 2+ pulses.  Lymph: No lymphadenopathy of posterior or anterior cervical chain or axillae bilaterally.  Gait normal with good balance and coordination.  MSK:  Non tender with full range of motion and good stability and symmetric strength and tone of shoulders, elbows, wrist, hip, knee and ankles bilaterally.  Neck: Inspection loss of lordosis. No palpable stepoffs. Negative Spurling's maneuver. Mild limited range of motion in all planes Grip strength and sensation normal in bilateral hands Strength good C4 to T1 distribution No sensory change to C4 to T1 Negative Hoffman sign bilaterally Reflexes normal Tightness of the trapezius bilaterally  Low back loss of lordosis.  Patient does have tenderness to palpation over the sacroiliac joints bilaterally.  Negative straight leg test.  5-5 strength in lower extremities.  Osteopathic findings T9 extended rotated and side bent left L2 flexed rotated and side bent right Sacrum right on right    Impression and Recommendations:     This case required medical decision making of moderate complexity. The above documentation has been reviewed and is accurate and complete Judi SaaZachary M , DO       Note: This dictation was prepared with Dragon dictation along with smaller phrase technology. Any transcriptional errors that result from this process are unintentional.

## 2018-12-10 ENCOUNTER — Encounter (HOSPITAL_COMMUNITY)
Admission: RE | Admit: 2018-12-10 | Discharge: 2018-12-10 | Disposition: A | Discharge status: Home / Self Care | Payer: Medicare Other | Source: Ambulatory Visit | Attending: Neurosurgery | Admitting: Neurosurgery

## 2018-12-10 ENCOUNTER — Ambulatory Visit (INDEPENDENT_AMBULATORY_CARE_PROVIDER_SITE_OTHER): Payer: Medicare Other | Admitting: Family Medicine

## 2018-12-10 ENCOUNTER — Other Ambulatory Visit: Payer: Self-pay

## 2018-12-10 ENCOUNTER — Encounter: Payer: Self-pay | Admitting: Family Medicine

## 2018-12-10 ENCOUNTER — Encounter (HOSPITAL_COMMUNITY): Payer: Self-pay

## 2018-12-10 VITALS — BP 110/80 | HR 96 | Ht 64.0 in | Wt 176.0 lb

## 2018-12-10 DIAGNOSIS — M9902 Segmental and somatic dysfunction of thoracic region: Secondary | ICD-10-CM

## 2018-12-10 DIAGNOSIS — M9903 Segmental and somatic dysfunction of lumbar region: Secondary | ICD-10-CM | POA: Diagnosis not present

## 2018-12-10 DIAGNOSIS — M9904 Segmental and somatic dysfunction of sacral region: Secondary | ICD-10-CM | POA: Diagnosis not present

## 2018-12-10 DIAGNOSIS — M545 Low back pain, unspecified: Secondary | ICD-10-CM

## 2018-12-10 DIAGNOSIS — M999 Biomechanical lesion, unspecified: Secondary | ICD-10-CM | POA: Diagnosis not present

## 2018-12-10 DIAGNOSIS — Z01812 Encounter for preprocedural laboratory examination: Secondary | ICD-10-CM | POA: Insufficient documentation

## 2018-12-10 HISTORY — DX: Unspecified multiple injuries, initial encounter: T07.XXXA

## 2018-12-10 HISTORY — DX: Pneumonia, unspecified organism: J18.9

## 2018-12-10 HISTORY — DX: Personal history of other specified conditions: Z87.898

## 2018-12-10 HISTORY — DX: Nausea with vomiting, unspecified: R11.2

## 2018-12-10 HISTORY — DX: Gastro-esophageal reflux disease without esophagitis: K21.9

## 2018-12-10 HISTORY — DX: Compression of brain: G93.5

## 2018-12-10 HISTORY — DX: Other specified postprocedural states: Z98.890

## 2018-12-10 HISTORY — DX: Personal history of traumatic brain injury: Z87.820

## 2018-12-10 HISTORY — DX: Post-traumatic stress disorder, unspecified: F43.10

## 2018-12-10 HISTORY — DX: Personal history of (healed) traumatic fracture: Z87.81

## 2018-12-10 LAB — BASIC METABOLIC PANEL
ANION GAP: 9 (ref 5–15)
BUN: 8 mg/dL (ref 6–20)
CO2: 24 mmol/L (ref 22–32)
Calcium: 9.1 mg/dL (ref 8.9–10.3)
Chloride: 106 mmol/L (ref 98–111)
Creatinine, Ser: 0.78 mg/dL (ref 0.44–1.00)
GFR calc Af Amer: >60 mL/min (ref >60)
GFR calc non Af Amer: >60 mL/min (ref >60)
Glucose, Bld: 85 mg/dL (ref 70–99)
Potassium: 3.8 mmol/L (ref 3.5–5.1)
Sodium: 139 mmol/L (ref 135–145)

## 2018-12-10 LAB — ABO/RH: ABO/RH(D): A POS

## 2018-12-10 LAB — TYPE AND SCREEN
ABO/RH(D): A POS
Antibody Screen: NEGATIVE

## 2018-12-10 LAB — CBC
HCT: 44.4 % (ref 36.0–46.0)
Hemoglobin: 13.4 g/dL (ref 12.0–15.0)
MCH: 28.7 pg (ref 26.0–34.0)
MCHC: 30.2 g/dL (ref 30.0–36.0)
MCV: 95.1 fL (ref 80.0–100.0)
PLATELETS: 247 10*3/uL (ref 150–400)
RBC: 4.67 MIL/uL (ref 3.87–5.11)
RDW: 13.6 % (ref 11.5–15.5)
WBC: 9.3 10*3/uL (ref 4.0–10.5)
nRBC: 0 % (ref 0.0–0.2)

## 2018-12-10 LAB — SURGICAL PCR SCREEN
MRSA, PCR: NEGATIVE
Staphylococcus aureus: NEGATIVE

## 2018-12-10 NOTE — Assessment & Plan Note (Addendum)
Decision today to treat with OMT was based on Physical Exam  After verbal consent patient was treated with HVLA, ME, FPR techniques in  thoracic, lumbar and sacral areas  Patient tolerated the procedure well with improvement in symptoms  Patient given exercises, stretches and lifestyle modifications  See medications in patient instructions if given  Patient will follow up in 7-8 weeks

## 2018-12-10 NOTE — Assessment & Plan Note (Signed)
Stable.  Responds well to manipulation.  Discussed posture and ergonomics as well as core strengthening.  Patient will focus on this after patient is cleared to start more vigorous physical activity after patient surgery next week.  We will see patient again in 7 to 8 weeks.

## 2018-12-10 NOTE — Progress Notes (Signed)
Pt stated " I was short of breath and had chest pain last night."  Pt denies having a stress test and cardiac cath. Pt denies having a chest x ray within the last year. Pt denies recent labs. Pt verbalized understanding of all pre-op instructions. Antionette PolesJames Burns, PA,  Anesthesiology, made aware of pt complaint and assessed pt during PAT visit. Pt chart forwarded to PA, Anesthesiology, as requested

## 2018-12-10 NOTE — Patient Instructions (Signed)
Happy New Year!  See me again in 7 weeks!

## 2018-12-12 DIAGNOSIS — G245 Blepharospasm: Secondary | ICD-10-CM | POA: Diagnosis not present

## 2018-12-12 NOTE — Progress Notes (Signed)
Anesthesia Chart Review:  Case:  [id number] Date/Time:  12/16/18 0945   Procedure:  Cervical 1 Laminectomy/Chiari decompression (N/A ) - Cervical 1 Laminectomy/Chiari decompression   Anesthesia type:  General   Pre-op diagnosis:  Chiari type 1 malformation   Location:  [facility] OR ROOM 18 / [facility] OR   Surgeon:  Donalee Citrin, MD      DISCUSSION: 33 yo female current smoker. Pertinent hx includes PONV, Asthma, PTSD, Anxiety, depression, Chiari type 1, GERD, Palpitations.  I was asked to see pt to clarify her report of recent chest pain. She said that on 12/30 she had been very active, cleaning her house, and when she laid down in the evening she developed some sharp chest pain and felt SOB. Both symptoms resolved spontaneously and she reports no symptoms today. She says this is not uncommon for her; she has a hx of anxiety and palpitations and endorses intermittent chest pain. She admits to increased anxiety due to upcoming surgery and says she thinks this is likely what caused her symptoms. She had a recent cardiac eval by Dr. Duke Salvia for palpitations and near syncope. Echo 08/29/2018 that revealed normal systolic function and was otherwise unremarkable. She was not orthostatic by vital signs.  Her event monitor did not show any arrhythmias. Per Dr. Leonides Sake note 09/18/2018: "I suspect that her symptoms are not cardiogenic and hopefully will improve with her Chiari decompression surgery.  This all started after her head trauma.  I did suggest that she ensure to hydrate and eat well to hopefully lessen the symptoms.  Consider beta blocker if no improvement after surgery."  On exam she is well appearing, in NAD, her heart is RRR, breath sounds are normal bilaterally. She denies any current CV symptoms.  Anticipate she can proceed as planned barring acute status change.   VS: BP (!) 120/95   Pulse 99   Temp 36.9 C   Ht 5\' 4"  (1.626 m)   Wt 79.6 kg   LMP 11/22/2018 (Approximate)   SpO2 96%   BMI 30.12 kg/m    PROVIDERS: Junie Spencer, FNP is PCP  Chilton Si, MD is Cardiologist  LABS: Labs reviewed: Acceptable for surgery. (all labs ordered are listed, but only abnormal results are displayed)  Labs Reviewed  SURGICAL PCR SCREEN  BASIC METABOLIC PANEL  CBC  TYPE AND SCREEN  ABO/RH    EKG: 07/30/2018: NSR. Rate 92. Possible LAE.  CV: TTE 08/29/2018: Study Conclusions  - Left ventricle: The cavity size was normal. Wall thickness was   normal. Systolic function was normal. The estimated ejection   fraction was in the range of 60% to 65%. Wall motion was normal;   there were no regional wall motion abnormalities. Left   ventricular diastolic function parameters were normal. - Aortic valve: Valve area (VTI): 1.98 cm^2. Valve area (Vmax):   1.83 cm^2. Valve area (Vmean): 1.9 cm^2. - Technically adequate study.  7 Day Zio Monitor 08/29/2018:  Quality: Fair.  Baseline artifact. Predominant rhythm: sinus rhythm Average heart rate: 94 bpm Max heart rate: 143 bpm  Rare PACs   Past Medical History:  Diagnosis Date  . Asthma   . Bronchitis 01/31/2016  . Chiari malformation type I (HCC)   . Depression   . GERD (gastroesophageal reflux disease)   . H/O multiple concussions   . Headache(784.0)   . History of palpitations   . Hx of fracture of nose   . Mental disorder   . Multiple fractures  Hx: of a leg and arm fracture as a child  . Pneumonia   . PONV (postoperative nausea and vomiting)   . Post traumatic stress disorder (PTSD)   . Pregnant 01/21/2016  . PTSD (post-traumatic stress disorder)   . Sinus infection 01/31/2016  . Supervision of normal pregnancy, antepartum 02/08/2016    [facility] at             13+1 week FOB   Sera Terranova 33 yo WM Dating By  LMP and [place] Pap  02/08/16 GC/CT Initial:                36+wks: Genetic Screen NT/IT:  CF screen  Anatomic [place]  Flu vaccine  Tdap Recommended ~ 28wks Glucose Screen  2 hr GBS  Feed  Preference  Contraception  Circumcision  Childbirth Classes  Pediatrician      Past Surgical History:  Procedure Laterality Date  . botox injections    . colposcopy    . DILATION AND CURETTAGE OF UTERUS    . nexplanon    . WISDOM TOOTH EXTRACTION      MEDICATIONS: . acetaminophen (TYLENOL) 325 MG tablet  . fluticasone (FLONASE) 50 MCG/ACT nasal spray  . hydrOXYzine (ATARAX/VISTARIL) 10 MG tablet  . montelukast (SINGULAIR) 10 MG tablet  . SUMAtriptan (IMITREX) 50 MG tablet  . venlafaxine XR (EFFEXOR-XR) 37.5 MG 24 hr capsule   . Vitamin D (Ergocalciferol) (DRISDOL) capsule 50,000 Units    [facility] Short Stay Center/Anesthesiology Phone [phone] 12/12/2018 1:33 PM

## 2018-12-12 NOTE — Anesthesia Preprocedure Evaluation (Addendum)
Anesthesia Evaluation  Patient identified by MRN, date of birth, ID band Patient awake    Reviewed: Allergy & Precautions, H&P , NPO status , Patient's Chart, lab work & pertinent test results, reviewed documented beta blocker date and time   History of Anesthesia Complications (+) PONV and history of anesthetic complications  Airway Mallampati: II  TM Distance: >3 FB Neck ROM: full    Dental no notable dental hx.    Pulmonary asthma , pneumonia, resolved, Current Smoker,    Pulmonary exam normal breath sounds clear to auscultation       Cardiovascular Exercise Tolerance: Good negative cardio ROS   Rhythm:regular Rate:Normal  EKG: 07/30/2018: NSR. Rate 92. Possible LAE.  TTE 08/29/2018:  Left ventricle: The cavity size was normal. Wall thickness was normal. Systolic function was normal. The estimated ejection fraction was in the range of 60% to 65%. Wall motion was normal; there were no regional wall motion abnormalities. Left ventricular diastolic function parameters were normal. - Aortic valve: Valve area (VTI): 1.98 cm^2. Valve area (Vmax): 1.83 cm^2. Valve area (Vmean): 1.9 cm^2. - Technically adequate study.  7 Day Zio Monitor 08/29/2018:  Quality: Fair. Baseline artifact. Predominant rhythm: sinus rhythm Average heart rate: 94 bpm Max heart rate: 143 bpm  Rare PACs   Neuro/Psych  Headaches, Anxiety Depression negative psych ROS   GI/Hepatic Neg liver ROS, GERD  ,  Endo/Other  negative endocrine ROS  Renal/GU negative Renal ROS  negative genitourinary   Musculoskeletal   Abdominal   Peds  Hematology negative hematology ROS (+)   Anesthesia Other Findings   Reproductive/Obstetrics negative OB ROS                                                             Anesthesia Evaluation  Patient identified by MRN, date of birth, ID band Patient  awake    Reviewed: Allergy & Precautions, NPO status , Patient's Chart, lab work & pertinent test results  Airway Mallampati: II  TM Distance: >3 FB Neck ROM: Full    Dental no notable dental hx.    Pulmonary asthma , Current Smoker,    Pulmonary exam normal breath sounds clear to auscultation       Cardiovascular negative cardio ROS Normal cardiovascular exam Rhythm:Regular Rate:Normal     Neuro/Psych  Headaches, PSYCHIATRIC DISORDERS Depression Bipolar Disorder    GI/Hepatic negative GI ROS, Neg liver ROS,   Endo/Other  negative endocrine ROS  Renal/GU negative Renal ROS  negative genitourinary   Musculoskeletal negative musculoskeletal ROS (+)   Abdominal   Peds negative pediatric ROS (+)  Hematology negative hematology ROS (+)   Anesthesia Other Findings   Reproductive/Obstetrics negative OB ROS                             Anesthesia Physical Anesthesia Plan  ASA: II  Anesthesia Plan: Epidural   Post-op Pain Management:    Induction: Intravenous  Airway Management Planned: Natural Airway  Additional Equipment:   Intra-op Plan:   Post-operative Plan:   Informed Consent: I have reviewed the patients History and Physical, chart, labs and discussed the procedure including the risks, benefits and alternatives for the proposed anesthesia with the patient or authorized representative who has indicated his/her  understanding and acceptance.   Dental advisory given  Plan Discussed with: CRNA  Anesthesia Plan Comments: (Informed consent obtained prior to proceeding including risk of failure, 1% risk of PDPH, risk of minor discomfort and bruising.  Discussed rare but serious complications including epidural abscess, permanent nerve injury, epidural hematoma.  Discussed alternatives to epidural analgesia and patient desires to proceed.  Timeout performed pre-procedure verifying patient name, procedure, and platelet  count.  Patient tolerated procedure well.)        Anesthesia Quick Evaluation  Anesthesia Physical Anesthesia Plan  ASA: II  Anesthesia Plan: General   Post-op Pain Management:    Induction: Intravenous  PONV Risk Score and Plan: 3 and Ondansetron and Treatment may vary due to age or medical condition  Airway Management Planned: Oral ETT  Additional Equipment:   Intra-op Plan:   Post-operative Plan: Extubation in OR  Informed Consent: I have reviewed the patients History and Physical, chart, labs and discussed the procedure including the risks, benefits and alternatives for the proposed anesthesia with the patient or authorized representative who has indicated his/her understanding and acceptance.   Dental Advisory Given  Plan Discussed with: CRNA, Anesthesiologist and Surgeon  Anesthesia Plan Comments: (See PAT note by Antionette Poles, PA-C )       Anesthesia Quick Evaluation

## 2018-12-16 ENCOUNTER — Inpatient Hospital Stay (HOSPITAL_COMMUNITY): Payer: Medicare Other | Admitting: Physician Assistant

## 2018-12-16 ENCOUNTER — Inpatient Hospital Stay (HOSPITAL_COMMUNITY)
Admission: RE | Admit: 2018-12-16 | Discharge: 2018-12-19 | DRG: 027 | Disposition: A | Discharge status: Home / Self Care | Payer: Medicare Other | Source: Ambulatory Visit | Attending: Neurosurgery | Admitting: Neurosurgery

## 2018-12-16 ENCOUNTER — Encounter (HOSPITAL_COMMUNITY)
Admission: RE | Disposition: A | Discharge status: Home / Self Care | Payer: Self-pay | Source: Ambulatory Visit | Attending: Neurosurgery

## 2018-12-16 ENCOUNTER — Encounter (HOSPITAL_COMMUNITY): Payer: Self-pay | Admitting: Certified Registered Nurse Anesthetist

## 2018-12-16 ENCOUNTER — Inpatient Hospital Stay (HOSPITAL_COMMUNITY): Payer: Medicare Other | Admitting: Certified Registered Nurse Anesthetist

## 2018-12-16 ENCOUNTER — Other Ambulatory Visit: Payer: Self-pay

## 2018-12-16 DIAGNOSIS — Z882 Allergy status to sulfonamides status: Secondary | ICD-10-CM | POA: Diagnosis not present

## 2018-12-16 DIAGNOSIS — J45909 Unspecified asthma, uncomplicated: Secondary | ICD-10-CM | POA: Diagnosis not present

## 2018-12-16 DIAGNOSIS — Z818 Family history of other mental and behavioral disorders: Secondary | ICD-10-CM

## 2018-12-16 DIAGNOSIS — Z79899 Other long term (current) drug therapy: Secondary | ICD-10-CM

## 2018-12-16 DIAGNOSIS — F419 Anxiety disorder, unspecified: Secondary | ICD-10-CM | POA: Diagnosis present

## 2018-12-16 DIAGNOSIS — Z885 Allergy status to narcotic agent status: Secondary | ICD-10-CM | POA: Diagnosis not present

## 2018-12-16 DIAGNOSIS — Z803 Family history of malignant neoplasm of breast: Secondary | ICD-10-CM

## 2018-12-16 DIAGNOSIS — F329 Major depressive disorder, single episode, unspecified: Secondary | ICD-10-CM | POA: Diagnosis present

## 2018-12-16 DIAGNOSIS — Z91010 Allergy to peanuts: Secondary | ICD-10-CM | POA: Diagnosis not present

## 2018-12-16 DIAGNOSIS — K219 Gastro-esophageal reflux disease without esophagitis: Secondary | ICD-10-CM | POA: Diagnosis not present

## 2018-12-16 DIAGNOSIS — Z8249 Family history of ischemic heart disease and other diseases of the circulatory system: Secondary | ICD-10-CM | POA: Diagnosis not present

## 2018-12-16 DIAGNOSIS — Z9889 Other specified postprocedural states: Secondary | ICD-10-CM

## 2018-12-16 DIAGNOSIS — F17210 Nicotine dependence, cigarettes, uncomplicated: Secondary | ICD-10-CM | POA: Diagnosis present

## 2018-12-16 DIAGNOSIS — Z808 Family history of malignant neoplasm of other organs or systems: Secondary | ICD-10-CM | POA: Diagnosis not present

## 2018-12-16 DIAGNOSIS — Z8049 Family history of malignant neoplasm of other genital organs: Secondary | ICD-10-CM | POA: Diagnosis not present

## 2018-12-16 DIAGNOSIS — Z825 Family history of asthma and other chronic lower respiratory diseases: Secondary | ICD-10-CM | POA: Diagnosis not present

## 2018-12-16 DIAGNOSIS — G935 Compression of brain: Secondary | ICD-10-CM | POA: Diagnosis present

## 2018-12-16 HISTORY — PX: SUBOCCIPITAL CRANIECTOMY CERVICAL LAMINECTOMY: SHX5404

## 2018-12-16 LAB — BASIC METABOLIC PANEL
Anion gap: 8 (ref 5–15)
BUN: 9 mg/dL (ref 6–20)
CHLORIDE: 108 mmol/L (ref 98–111)
CO2: 21 mmol/L — ABNORMAL LOW (ref 22–32)
Calcium: 8.6 mg/dL — ABNORMAL LOW (ref 8.9–10.3)
Creatinine, Ser: 0.94 mg/dL (ref 0.44–1.00)
GFR calc Af Amer: >60 mL/min (ref >60)
GFR calc non Af Amer: >60 mL/min (ref >60)
Glucose, Bld: 141 mg/dL — ABNORMAL HIGH (ref 70–99)
Potassium: 3.7 mmol/L (ref 3.5–5.1)
Sodium: 137 mmol/L (ref 135–145)

## 2018-12-16 LAB — CBC
HEMATOCRIT: 42.3 % (ref 36.0–46.0)
Hemoglobin: 12.8 g/dL (ref 12.0–15.0)
MCH: 28.9 pg (ref 26.0–34.0)
MCHC: 30.3 g/dL (ref 30.0–36.0)
MCV: 95.5 fL (ref 80.0–100.0)
Platelets: 221 10*3/uL (ref 150–400)
RBC: 4.43 MIL/uL (ref 3.87–5.11)
RDW: 13.9 % (ref 11.5–15.5)
WBC: 14.4 10*3/uL — ABNORMAL HIGH (ref 4.0–10.5)
nRBC: 0 % (ref 0.0–0.2)

## 2018-12-16 LAB — MRSA PCR SCREENING: MRSA by PCR: NEGATIVE

## 2018-12-16 LAB — POCT PREGNANCY, URINE: Preg Test, Ur: NEGATIVE

## 2018-12-16 SURGERY — SUBOCCIPITAL CRANIECTOMY CERVICAL LAMINECTOMY/DURAPLASTY
Anesthesia: General | Site: Head

## 2018-12-16 MED ORDER — BACITRACIN ZINC 500 UNIT/GM EX OINT
TOPICAL_OINTMENT | CUTANEOUS | Status: AC
  Filled 2018-12-16: qty 28.35

## 2018-12-16 MED ORDER — OXYCODONE HCL 5 MG PO TABS
ORAL_TABLET | ORAL | Status: AC
  Filled 2018-12-16: qty 1

## 2018-12-16 MED ORDER — BUPIVACAINE HCL (PF) 0.25 % IJ SOLN
INTRAMUSCULAR | Status: AC
  Filled 2018-12-16: qty 30

## 2018-12-16 MED ORDER — BACITRACIN ZINC 500 UNIT/GM EX OINT
TOPICAL_OINTMENT | CUTANEOUS | Status: DC | PRN
  Administered 2018-12-16: 1 via TOPICAL

## 2018-12-16 MED ORDER — ROCURONIUM BROMIDE 10 MG/ML (PF) SYRINGE
PREFILLED_SYRINGE | INTRAVENOUS | Status: DC | PRN
  Administered 2018-12-16: 20 mg via INTRAVENOUS
  Administered 2018-12-16 (×2): 10 mg via INTRAVENOUS
  Administered 2018-12-16: 50 mg via INTRAVENOUS
  Administered 2018-12-16: 20 mg via INTRAVENOUS

## 2018-12-16 MED ORDER — 0.9 % SODIUM CHLORIDE (POUR BTL) OPTIME
TOPICAL | Status: DC | PRN
  Administered 2018-12-16: 1000 mL

## 2018-12-16 MED ORDER — ONDANSETRON HCL 4 MG/2ML IJ SOLN
INTRAMUSCULAR | Status: AC
  Filled 2018-12-16: qty 2

## 2018-12-16 MED ORDER — OXYCODONE HCL 5 MG PO TABS
5.0000 mg | ORAL_TABLET | Freq: Once | ORAL | Status: AC | PRN
  Administered 2018-12-16: 5 mg via ORAL

## 2018-12-16 MED ORDER — PANTOPRAZOLE SODIUM 40 MG IV SOLR
40.0000 mg | Freq: Every day | INTRAVENOUS | Status: DC
  Administered 2018-12-16: 40 mg via INTRAVENOUS
  Filled 2018-12-16: qty 40

## 2018-12-16 MED ORDER — CEFAZOLIN SODIUM-DEXTROSE 2-4 GM/100ML-% IV SOLN
2.0000 g | Freq: Three times a day (TID) | INTRAVENOUS | Status: AC
  Administered 2018-12-16 – 2018-12-17 (×2): 2 g via INTRAVENOUS
  Filled 2018-12-16 (×3): qty 100

## 2018-12-16 MED ORDER — MEPERIDINE HCL 50 MG/ML IJ SOLN: 6.2500 mg | INTRAMUSCULAR | Status: DC | PRN

## 2018-12-16 MED ORDER — FLUTICASONE PROPIONATE 50 MCG/ACT NA SUSP
2.0000 | Freq: Every day | NASAL | Status: DC
  Administered 2018-12-17 – 2018-12-19 (×3): 2 via NASAL
  Filled 2018-12-16: qty 16

## 2018-12-16 MED ORDER — LIDOCAINE-EPINEPHRINE 1 %-1:100000 IJ SOLN
INTRAMUSCULAR | Status: AC
  Filled 2018-12-16: qty 1

## 2018-12-16 MED ORDER — FENTANYL CITRATE (PF) 100 MCG/2ML IJ SOLN
25.0000 ug | INTRAMUSCULAR | Status: AC | PRN
  Administered 2018-12-16 (×6): 25 ug via INTRAVENOUS

## 2018-12-16 MED ORDER — THROMBIN 5000 UNITS EX SOLR
CUTANEOUS | Status: DC | PRN
  Administered 2018-12-16 (×2): 5000 [IU] via TOPICAL

## 2018-12-16 MED ORDER — CHLORHEXIDINE GLUCONATE CLOTH 2 % EX PADS: 6.0000 | MEDICATED_PAD | Freq: Once | CUTANEOUS | Status: DC

## 2018-12-16 MED ORDER — DEXAMETHASONE SODIUM PHOSPHATE 4 MG/ML IJ SOLN
4.0000 mg | Freq: Three times a day (TID) | INTRAMUSCULAR | Status: DC
  Administered 2018-12-18 – 2018-12-19 (×3): 4 mg via INTRAVENOUS
  Filled 2018-12-16 (×3): qty 1

## 2018-12-16 MED ORDER — ACETAMINOPHEN 10 MG/ML IV SOLN
INTRAVENOUS | Status: AC
  Filled 2018-12-16: qty 100

## 2018-12-16 MED ORDER — ACETAMINOPHEN 10 MG/ML IV SOLN
INTRAVENOUS | Status: DC | PRN
  Administered 2018-12-16: 1000 mg via INTRAVENOUS

## 2018-12-16 MED ORDER — HYDROMORPHONE HCL 1 MG/ML IJ SOLN
0.5000 mg | INTRAMUSCULAR | Status: DC | PRN
  Administered 2018-12-16 – 2018-12-18 (×10): 1 mg via INTRAVENOUS
  Filled 2018-12-16 (×11): qty 1

## 2018-12-16 MED ORDER — VENLAFAXINE HCL ER 37.5 MG PO CP24
37.5000 mg | ORAL_CAPSULE | Freq: Every day | ORAL | Status: DC
  Filled 2018-12-16 (×3): qty 1

## 2018-12-16 MED ORDER — HEMOSTATIC AGENTS (NO CHARGE) OPTIME
TOPICAL | Status: DC | PRN
  Administered 2018-12-16 (×3): 1 via TOPICAL

## 2018-12-16 MED ORDER — PROPOFOL 10 MG/ML IV BOLUS
INTRAVENOUS | Status: AC
  Filled 2018-12-16: qty 20

## 2018-12-16 MED ORDER — ONDANSETRON HCL 4 MG/2ML IJ SOLN: 4.0000 mg | INTRAMUSCULAR | Status: DC | PRN

## 2018-12-16 MED ORDER — FENTANYL CITRATE (PF) 100 MCG/2ML IJ SOLN
INTRAMUSCULAR | Status: AC
  Administered 2018-12-16: 25 ug via INTRAVENOUS
  Filled 2018-12-16: qty 2

## 2018-12-16 MED ORDER — SODIUM CHLORIDE 0.9 % IV SOLN
INTRAVENOUS | Status: DC | PRN
  Administered 2018-12-16: 15 ug/min via INTRAVENOUS

## 2018-12-16 MED ORDER — DEXAMETHASONE SODIUM PHOSPHATE 10 MG/ML IJ SOLN
INTRAMUSCULAR | Status: AC
  Filled 2018-12-16: qty 1

## 2018-12-16 MED ORDER — LIDOCAINE-EPINEPHRINE 1 %-1:100000 IJ SOLN
INTRAMUSCULAR | Status: DC | PRN
  Administered 2018-12-16: 10 mL

## 2018-12-16 MED ORDER — LIDOCAINE 20MG/ML (2%) 15 ML SYRINGE OPTIME
INTRAMUSCULAR | Status: DC | PRN
  Administered 2018-12-16: 100 mg via INTRAVENOUS

## 2018-12-16 MED ORDER — SUGAMMADEX SODIUM 200 MG/2ML IV SOLN
INTRAVENOUS | Status: DC | PRN
  Administered 2018-12-16: 154.2 mg via INTRAVENOUS

## 2018-12-16 MED ORDER — FENTANYL CITRATE (PF) 250 MCG/5ML IJ SOLN
INTRAMUSCULAR | Status: AC
  Filled 2018-12-16: qty 5

## 2018-12-16 MED ORDER — DEXAMETHASONE SODIUM PHOSPHATE 10 MG/ML IJ SOLN
INTRAMUSCULAR | Status: DC | PRN
  Administered 2018-12-16: 10 mg via INTRAVENOUS

## 2018-12-16 MED ORDER — PROMETHAZINE HCL 25 MG PO TABS: 12.5000 mg | ORAL_TABLET | ORAL | Status: DC | PRN

## 2018-12-16 MED ORDER — PROPOFOL 10 MG/ML IV BOLUS
INTRAVENOUS | Status: DC | PRN
  Administered 2018-12-16: 150 mg via INTRAVENOUS
  Administered 2018-12-16: 50 mg via INTRAVENOUS

## 2018-12-16 MED ORDER — MIDAZOLAM HCL 2 MG/2ML IJ SOLN
INTRAMUSCULAR | Status: AC
  Filled 2018-12-16: qty 2

## 2018-12-16 MED ORDER — ACETAMINOPHEN 160 MG/5ML PO SOLN: 325.0000 mg | ORAL | Status: DC | PRN

## 2018-12-16 MED ORDER — LIDOCAINE 2% (20 MG/ML) 5 ML SYRINGE
INTRAMUSCULAR | Status: AC
  Filled 2018-12-16: qty 5

## 2018-12-16 MED ORDER — OXYCODONE HCL 5 MG/5ML PO SOLN: 5.0000 mg | Freq: Once | ORAL | Status: AC | PRN

## 2018-12-16 MED ORDER — HYDROCODONE-ACETAMINOPHEN 5-325 MG PO TABS
1.0000 | ORAL_TABLET | ORAL | Status: DC | PRN
  Administered 2018-12-17: 1 via ORAL
  Filled 2018-12-16: qty 1

## 2018-12-16 MED ORDER — SUMATRIPTAN SUCCINATE 50 MG PO TABS
50.0000 mg | ORAL_TABLET | ORAL | Status: DC | PRN
  Filled 2018-12-16: qty 1

## 2018-12-16 MED ORDER — THROMBIN 5000 UNITS EX SOLR
OROMUCOSAL | Status: DC | PRN
  Administered 2018-12-16: 5 mL via TOPICAL

## 2018-12-16 MED ORDER — ACETAMINOPHEN 325 MG PO TABS: 650.0000 mg | ORAL_TABLET | Freq: Four times a day (QID) | ORAL | Status: DC | PRN

## 2018-12-16 MED ORDER — ONDANSETRON HCL 4 MG/2ML IJ SOLN
INTRAMUSCULAR | Status: DC | PRN
  Administered 2018-12-16: 4 mg via INTRAVENOUS

## 2018-12-16 MED ORDER — ONDANSETRON HCL 4 MG/2ML IJ SOLN
4.0000 mg | Freq: Once | INTRAMUSCULAR | Status: AC | PRN
  Administered 2018-12-16: 4 mg via INTRAVENOUS

## 2018-12-16 MED ORDER — CEFAZOLIN SODIUM-DEXTROSE 2-4 GM/100ML-% IV SOLN
2.0000 g | INTRAVENOUS | Status: AC
  Administered 2018-12-16: 2 g via INTRAVENOUS

## 2018-12-16 MED ORDER — LABETALOL HCL 5 MG/ML IV SOLN: 10.0000 mg | INTRAVENOUS | Status: DC | PRN

## 2018-12-16 MED ORDER — MONTELUKAST SODIUM 10 MG PO TABS
10.0000 mg | ORAL_TABLET | Freq: Every day | ORAL | Status: DC
  Administered 2018-12-16 – 2018-12-18 (×3): 10 mg via ORAL
  Filled 2018-12-16 (×3): qty 1

## 2018-12-16 MED ORDER — DEXAMETHASONE SODIUM PHOSPHATE 10 MG/ML IJ SOLN
6.0000 mg | Freq: Four times a day (QID) | INTRAMUSCULAR | Status: AC
  Administered 2018-12-16 – 2018-12-17 (×4): 6 mg via INTRAVENOUS
  Filled 2018-12-16 (×4): qty 1

## 2018-12-16 MED ORDER — FENTANYL CITRATE (PF) 100 MCG/2ML IJ SOLN
INTRAMUSCULAR | Status: DC | PRN
  Administered 2018-12-16 (×2): 50 ug via INTRAVENOUS
  Administered 2018-12-16: 100 ug via INTRAVENOUS
  Administered 2018-12-16: 50 ug via INTRAVENOUS

## 2018-12-16 MED ORDER — ONDANSETRON HCL 4 MG PO TABS: 4.0000 mg | ORAL_TABLET | ORAL | Status: DC | PRN

## 2018-12-16 MED ORDER — DOCUSATE SODIUM 100 MG PO CAPS
100.0000 mg | ORAL_CAPSULE | Freq: Two times a day (BID) | ORAL | Status: DC
  Administered 2018-12-17: 100 mg via ORAL
  Filled 2018-12-16 (×2): qty 1

## 2018-12-16 MED ORDER — LACTATED RINGERS IV SOLN
INTRAVENOUS | Status: DC
  Administered 2018-12-16 (×2): via INTRAVENOUS

## 2018-12-16 MED ORDER — THROMBIN 5000 UNITS EX SOLR
CUTANEOUS | Status: AC
  Filled 2018-12-16: qty 15000

## 2018-12-16 MED ORDER — MIDAZOLAM HCL 5 MG/5ML IJ SOLN
INTRAMUSCULAR | Status: DC | PRN
  Administered 2018-12-16: 2 mg via INTRAVENOUS

## 2018-12-16 MED ORDER — HYDROXYZINE HCL 10 MG PO TABS
10.0000 mg | ORAL_TABLET | Freq: Three times a day (TID) | ORAL | Status: DC | PRN
  Filled 2018-12-16: qty 1

## 2018-12-16 MED ORDER — SCOPOLAMINE 1 MG/3DAYS TD PT72
MEDICATED_PATCH | TRANSDERMAL | Status: DC | PRN
  Administered 2018-12-16: 1 via TRANSDERMAL

## 2018-12-16 MED ORDER — POTASSIUM CHLORIDE IN NACL 20-0.9 MEQ/L-% IV SOLN
INTRAVENOUS | Status: DC
  Administered 2018-12-16 – 2018-12-17 (×2): via INTRAVENOUS
  Filled 2018-12-16 (×2): qty 1000

## 2018-12-16 MED ORDER — SODIUM CHLORIDE 0.9 % IV SOLN
INTRAVENOUS | Status: DC | PRN
  Administered 2018-12-16: 500 mL

## 2018-12-16 MED ORDER — DEXAMETHASONE SODIUM PHOSPHATE 4 MG/ML IJ SOLN
4.0000 mg | Freq: Four times a day (QID) | INTRAMUSCULAR | Status: AC
  Administered 2018-12-17 – 2018-12-18 (×4): 4 mg via INTRAVENOUS
  Filled 2018-12-16 (×4): qty 1

## 2018-12-16 MED ORDER — ACETAMINOPHEN 325 MG PO TABS: 325.0000 mg | ORAL_TABLET | ORAL | Status: DC | PRN

## 2018-12-16 MED ORDER — CEFAZOLIN SODIUM-DEXTROSE 2-4 GM/100ML-% IV SOLN
INTRAVENOUS | Status: AC
  Filled 2018-12-16: qty 100

## 2018-12-16 MED ORDER — ESMOLOL HCL 100 MG/10ML IV SOLN
INTRAVENOUS | Status: DC | PRN
  Administered 2018-12-16: 30 mg via INTRAVENOUS

## 2018-12-16 SURGICAL SUPPLY — 75 items
ADH SKN CLS APL DERMABOND .7 (GAUZE/BANDAGES/DRESSINGS) ×1
APL SKNCLS STERI-STRIP NONHPOA (GAUZE/BANDAGES/DRESSINGS)
BAG DECANTER FOR FLEXI CONT (MISCELLANEOUS) ×2 IMPLANT
BENZOIN TINCTURE PRP APPL 2/3 (GAUZE/BANDAGES/DRESSINGS) IMPLANT
BLADE CLIPPER SURG (BLADE) ×3 IMPLANT
BLADE SURG 11 STRL SS (BLADE) ×2 IMPLANT
BLADE ULTRA TIP 2M (BLADE) IMPLANT
BUR ACORN 9.0 PRECISION (BURR) ×2 IMPLANT
CABLE BIPOLOR RESECTION CORD (MISCELLANEOUS) ×1 IMPLANT
CANISTER SUCT 3000ML PPV (MISCELLANEOUS) ×2 IMPLANT
CARTRIDGE OIL MAESTRO DRILL (MISCELLANEOUS) ×1 IMPLANT
CLIP VESOCCLUDE MED 6/CT (CLIP) ×1 IMPLANT
CLSR STERI-STRIP ANTIMIC 1/2X4 (GAUZE/BANDAGES/DRESSINGS) IMPLANT
COVER WAND RF STERILE (DRAPES) ×2 IMPLANT
DERMABOND ADVANCED (GAUZE/BANDAGES/DRESSINGS) ×1
DERMABOND ADVANCED .7 DNX12 (GAUZE/BANDAGES/DRESSINGS) ×1 IMPLANT
DIFFUSER DRILL AIR PNEUMATIC (MISCELLANEOUS) ×2 IMPLANT
DRAPE LAPAROTOMY 100X72 PEDS (DRAPES) ×2 IMPLANT
DRAPE MICROSCOPE LEICA (MISCELLANEOUS) IMPLANT
DRAPE SURG 17X23 STRL (DRAPES) ×4 IMPLANT
DRAPE WARM FLUID 44X44 (DRAPE) ×1 IMPLANT
DRSG OPSITE POSTOP 4X8 (GAUZE/BANDAGES/DRESSINGS) ×1 IMPLANT
DURAGUARD 04CMX04CM ×1 IMPLANT
ELECT REM PT RETURN 9FT ADLT (ELECTROSURGICAL) ×2
ELECTRODE REM PT RTRN 9FT ADLT (ELECTROSURGICAL) ×1 IMPLANT
EVACUATOR 1/8 PVC DRAIN (DRAIN) IMPLANT
EVACUATOR SILICONE 100CC (DRAIN) IMPLANT
GAUZE 4X4 16PLY RFD (DISPOSABLE) IMPLANT
GAUZE SPONGE 4X4 12PLY STRL (GAUZE/BANDAGES/DRESSINGS) IMPLANT
GLOVE BIO SURGEON STRL SZ7 (GLOVE) ×1 IMPLANT
GLOVE BIO SURGEON STRL SZ7.5 (GLOVE) IMPLANT
GLOVE BIO SURGEON STRL SZ8 (GLOVE) ×2 IMPLANT
GLOVE BIOGEL PI IND STRL 7.0 (GLOVE) IMPLANT
GLOVE BIOGEL PI IND STRL 7.5 (GLOVE) IMPLANT
GLOVE BIOGEL PI INDICATOR 7.0 (GLOVE) ×2
GLOVE BIOGEL PI INDICATOR 7.5 (GLOVE) ×3
GLOVE EXAM NITRILE XL STR (GLOVE) IMPLANT
GLOVE INDICATOR 8.5 STRL (GLOVE) ×2 IMPLANT
GLOVE SURG SS PI 7.0 STRL IVOR (GLOVE) ×4 IMPLANT
GOWN STRL REUS W/ TWL LRG LVL3 (GOWN DISPOSABLE) ×1 IMPLANT
GOWN STRL REUS W/ TWL XL LVL3 (GOWN DISPOSABLE) ×1 IMPLANT
GOWN STRL REUS W/TWL 2XL LVL3 (GOWN DISPOSABLE) ×2 IMPLANT
GOWN STRL REUS W/TWL LRG LVL3 (GOWN DISPOSABLE) ×2
GOWN STRL REUS W/TWL XL LVL3 (GOWN DISPOSABLE) ×4
HEMOSTAT POWDER KIT SURGIFOAM (HEMOSTASIS) ×1 IMPLANT
HEMOSTAT SURGICEL 2X14 (HEMOSTASIS) ×1 IMPLANT
KIT BASIN OR (CUSTOM PROCEDURE TRAY) ×2 IMPLANT
KIT TURNOVER KIT B (KITS) ×2 IMPLANT
NEEDLE HYPO 22GX1.5 SAFETY (NEEDLE) ×2 IMPLANT
NS IRRIG 1000ML POUR BTL (IV SOLUTION) ×2 IMPLANT
OIL CARTRIDGE MAESTRO DRILL (MISCELLANEOUS) ×2
PACK LAMINECTOMY NEURO (CUSTOM PROCEDURE TRAY) ×2 IMPLANT
PAD ARMBOARD 7.5X6 YLW CONV (MISCELLANEOUS) ×6 IMPLANT
PATTIES SURGICAL .5 X3 (DISPOSABLE) ×1 IMPLANT
PATTIES SURGICAL 1/4 X 3 (GAUZE/BANDAGES/DRESSINGS) IMPLANT
RUBBERBAND STERILE (MISCELLANEOUS) IMPLANT
SEALANT ADHERUS EXTEND TIP (MISCELLANEOUS) ×1 IMPLANT
SPONGE LAP 4X18 RFD (DISPOSABLE) IMPLANT
SPONGE SURGIFOAM ABS GEL 100 (HEMOSTASIS) ×1 IMPLANT
SPONGE SURGIFOAM ABS GEL SZ50 (HEMOSTASIS) ×2 IMPLANT
STAPLER SKIN PROX WIDE 3.9 (STAPLE) IMPLANT
STRIP CLOSURE SKIN 1/2X4 (GAUZE/BANDAGES/DRESSINGS) IMPLANT
SUT ETHILON 2 0 FS 18 (SUTURE) IMPLANT
SUT ETHILON 3 0 FSL (SUTURE) ×2 IMPLANT
SUT NURALON 4 0 TR CR/8 (SUTURE) ×4 IMPLANT
SUT PROLENE 6 0 BV (SUTURE) IMPLANT
SUT VIC AB 1 CT1 18XBRD ANBCTR (SUTURE) ×1 IMPLANT
SUT VIC AB 1 CT1 8-18 (SUTURE) ×2
SUT VIC AB 2-0 CT1 18 (SUTURE) ×2 IMPLANT
SUT VIC AB 3-0 SH 8-18 (SUTURE) ×1 IMPLANT
TOWEL GREEN STERILE (TOWEL DISPOSABLE) ×2 IMPLANT
TOWEL GREEN STERILE FF (TOWEL DISPOSABLE) ×1 IMPLANT
TRAY FOLEY MTR SLVR 16FR STAT (SET/KITS/TRAYS/PACK) ×1 IMPLANT
UNDERPAD 30X30 (UNDERPADS AND DIAPERS) IMPLANT
WATER STERILE IRR 1000ML POUR (IV SOLUTION) ×2 IMPLANT

## 2018-12-16 NOTE — Op Note (Signed)
Preoperative diagnosis: Chiari I malformation with tonsillar descension below the C1 lamina  Postoperative diagnosis: Same  Procedure: Suboccipital craniectomy and C1 laminectomy for decompression of Chiari I malformation with duraplasty  Surgeon: Jillyn Hidden Simmie Garin  Asst.: Ervin Knack  Anesthesia: Gen.  EBL: Minimal  History of present illness: 33 year old female with long-standing headaches refractory to all forms of conservative workup revealed crowding at the foramen magnum with tonsillar descension down below the C1 lamina consistent with Chiari I malformation. Due to patient progressive clinical syndrome imaging findings and failed conservative treatment I recommended subacute subdural craniectomy C1 laminectomy and duraplasty for decompression of Chiari I malformation. I extensively went over the risks and benefits of the operation with her as well as perioperative course expectations of outcome and alternatives of surgery and she understood and agreed to proceed forward.  Operative procedure: Patient brought into the or was induced under general anesthesia positioned prone in pins in the neck in slight flexion the backs of her head was shaved prepped and draped in routine sterile fashion. After infiltration of 10 mL lidocaine with epi a midline incision was made from just below the inion to below the C2 spinous process. Subperiosteal dissection was carried out over the posterior fossa in the cerebellar hemisphere down to the C1 lamina and C2 lamina spinous process were all exposed. Then utilizing high-speed drill I drilled down both cerebellar hemispheres to rule out the sub-occiput down and removed and opened up the foramen magnum identify the C1 lamina removed the C1 lamina exposing the dura overlying the cerebellar hemispheres. I then opened up the dura and a Y-shaped fashion down to below the C1 lamina to just above the C2 lamina complex. At this point immediately identified the cerebellar  tonsils and made sure I perform a durotomy to below the level of the tonsillar descension. The arachnoid was intact. I confirm the cerebellum and tonsils and cervical medullary junction were widely decompressed. There are good pulsations.I then selected a 4 x 4 cm bovine pericardium dural patch graft and fashioned it and sewed it to the dura creating a large patch and pocket for decompression. Utilizing Nurolon I created a watertight seal confirmed by Valsalva down along the suture line I squirted the green fibrin glue and a Gelfoam patch. I then closed in layers with after Vicryl the skin and a running locking nylon. Wound was then dressed and patient was x-rayed and sent to recovery in stable condition. At the end the case all needle counts sponge counts were correct.

## 2018-12-16 NOTE — Anesthesia Postprocedure Evaluation (Signed)
Anesthesia Post Note  Patient: Mary [name]  Procedure(s) Performed: Cervical one Laminectomy/Chiari decompression (N/A Head)     Patient location during evaluation: PACU Anesthesia Type: General Level of consciousness: awake and alert Pain management: pain level controlled Vital Signs Assessment: post-procedure vital signs reviewed and stable Respiratory status: spontaneous breathing, nonlabored ventilation, respiratory function stable and patient connected to nasal cannula oxygen Cardiovascular status: blood pressure returned to baseline and stable Postop Assessment: no apparent nausea or vomiting Anesthetic complications: no    Last Vitals:  Vitals:   12/16/18 1608 12/16/18 1623  BP: 114/69 112/67  Pulse: 78 84  Resp: 10 10  Temp:  36.6 C  SpO2: 94% 93%    Last Pain:  Vitals:   12/16/18 1700  PainSc: 5                  Franky Reier DAVID

## 2018-12-16 NOTE — Transfer of Care (Signed)
Immediate Anesthesia Transfer of Care Note  Patient: Mary [name]  Procedure(s) Performed: Cervical one Laminectomy/Chiari decompression (N/A Head)  Patient Location: PACU  Anesthesia Type:General  Level of Consciousness: awake, alert , oriented and patient cooperative  Airway & Oxygen Therapy: Patient Spontanous Breathing and Patient connected to nasal cannula oxygen  Post-op Assessment: Report given to RN, Post -op Vital signs reviewed and stable and Patient moving all extremities X 4  Post vital signs: Reviewed and stable  Last Vitals:  Vitals Value Taken Time  BP    Temp    Pulse 105 12/16/2018  3:24 PM  Resp 20 12/16/2018  3:24 PM  SpO2 91 % 12/16/2018  3:24 PM  Vitals shown include unvalidated device data.  Last Pain:  Vitals:   12/16/18 0959  PainSc: 0-No pain      Patients Stated Pain Goal: 4 (12/16/18 0959)  Complications: No apparent anesthesia complications

## 2018-12-16 NOTE — H&P (Signed)
Mary [name] is an 33 y.o. female.   Chief Complaint: headaches HPI:  33 year old female with long-standing intractable headaches refractory to all forms of conservative treatment. Workup revealed Chiari I malformation with severe crowding tonsillar descensiondown to the level of the C1 lamina. Minimal CSF space around her brainstem and tonsils. Due to patient's progression of clinical syndrome imaging findings and failure conservative treatment I recommended suboccipital craniectomy for Chiari decompression and C1 laminectomy. I've extensively gone over the risks and benefits of the operation with her as well as perioperative course expectations of outcome alternatives of surgery and she understood and agreed to proceed forward.  Past Medical History:  Diagnosis Date  . Asthma   . Bronchitis 01/31/2016  . Chiari malformation type I (HCC)   . Depression   . GERD (gastroesophageal reflux disease)   . H/O multiple concussions   . Headache(784.0)   . History of palpitations   . Hx of fracture of nose   . Mental disorder   . Multiple fractures    Hx: of a leg and arm fracture as a child  . Pneumonia   . PONV (postoperative nausea and vomiting)   . Post traumatic stress disorder (PTSD)   . Pregnant 01/21/2016  . PTSD (post-traumatic stress disorder)   . Sinus infection 01/31/2016  . Supervision of normal pregnancy, antepartum 02/08/2016    [facility] at             13+1 week FOB   Mary [name] 33 yo WM Dating By  LMP and US Pap  02/08/16 GC/CT Initial:                36+wks: Genetic Screen NT/IT:  CF screen  Anatomic US  Flu vaccine  Tdap Recommended ~ 28wks Glucose Screen  2 hr GBS  Feed Preference  Contraception  Circumcision  Childbirth Classes  Pediatrician      Past Surgical History:  Procedure Laterality Date  . botox injections    . colposcopy    . DILATION AND CURETTAGE OF UTERUS    . nexplanon    . WISDOM TOOTH EXTRACTION      Family History  Problem  Relation Age of Onset  . Cancer Mother [id number]       breast  . Hypertension Mother   . Depression Mother   . Depression Father   . Seizures Father   . Cancer Maternal Grandmother        breast and cervical  . Asthma Son   . Asthma Son   . Hypertension Other   . Cancer Other    Social History:  reports that she has been smoking cigarettes. She has been smoking about 0.50 packs per day. She has never used smokeless tobacco. She reports current alcohol use. She reports that she does not use drugs.  Allergies:  Allergies  Allergen Reactions  . Codeine Shortness Of Breath and Other (See Comments)    Can not take in liquid form but does not recall exactly what formulation she had Has taken vicodin and percocet without problem per pt  . Peanut-Containing Drug Products Anaphylaxis  . Sulfa Antibiotics Anaphylaxis and Itching    Facility-Administered Medications Prior to Admission  Medication Dose Route Frequency Provider Last Rate Last Dose  . Vitamin D (Ergocalciferol) (DRISDOL) capsule 50,000 Units  50,000 Units Oral Q7 days Judi SaaSmith, Zachary M, DO       Medications Prior to Admission  Medication Sig Dispense Refill  . acetaminophen (  TYLENOL) 325 MG tablet Take 650 mg by mouth every 6 (six) hours as needed for moderate pain or headache. Reported on 05/01/2016    . fluticasone (FLONASE) 50 MCG/ACT nasal spray Place 2 sprays into both nostrils daily.     . hydrOXYzine (ATARAX/VISTARIL) 10 MG tablet Take 1 tablet (10 mg total) by mouth 3 (three) times daily as needed. 30 tablet 0  . montelukast (SINGULAIR) 10 MG tablet Take 1 tablet (10 mg total) by mouth at bedtime. 30 tablet 1  . SUMAtriptan (IMITREX) 50 MG tablet Take 1 tablet earliest onset of headache.  May repeat once in 2 hours if headache persists or recurs. 10 tablet 2  . venlafaxine XR (EFFEXOR-XR) 37.5 MG 24 hr capsule TAKE 1 CAPSULE (37.5 MG TOTAL) BY MOUTH DAILY WITH BREAKFAST. (Patient taking differently: Take 37.5 mg by mouth daily  with lunch. ) 90 capsule 1    Results for orders placed or performed during the hospital encounter of 12/16/18 (from the past 48 hour(s))  Pregnancy, urine POC     Status: None   Collection Time: 12/16/18  9:45 AM  Result Value Ref Range   Preg Test, Ur NEGATIVE NEGATIVE    Comment:        THE SENSITIVITY OF THIS METHODOLOGY IS >24 mIU/mL    No results found.  Review of Systems  Musculoskeletal: Positive for neck pain.  Neurological: Positive for headaches.    Blood pressure (!) 110/93, pulse 93, resp. rate 18, height 5\' 4"  (1.626 m), weight 77.1 kg, last menstrual period 11/22/2018, SpO2 99 %. Physical Exam  Constitutional: She is oriented to person, place, and time. She appears well-developed and well-nourished.  HENT:  Head: Normocephalic.  Eyes: Pupils are equal, round, and reactive to light.  Neck: Normal range of motion.  Respiratory: Effort normal.  GI: Soft.  Neurological: She is alert and oriented to person, place, and time. She has normal strength. GCS eye subscore is 4. GCS verbal subscore is 5. GCS motor subscore is 6.  Patient is awake alert pupils are equal cranial nerves are intact. Strength is 5 out of 5 upper and lower extremities  Skin: Skin is warm and dry.     Assessment/Plan 33 year female presents for suboccipital craniectomy and C1 laminectomy for Chiari decompression.  Mary [name] P, MD 12/16/2018, 12:06 PM

## 2018-12-16 NOTE — Anesthesia Procedure Notes (Signed)
Procedure Name: Intubation Date/Time: 12/16/2018 12:33 PM Performed by: Lowella Dell, CRNA Pre-anesthesia Checklist: Patient identified, Emergency Drugs available, Suction available and Patient being monitored Patient Re-evaluated:Patient Re-evaluated prior to induction Oxygen Delivery Method: Circle System Utilized Preoxygenation: Pre-oxygenation with 100% oxygen Induction Type: IV induction Ventilation: Mask ventilation without difficulty Laryngoscope Size: Mac and 3 Grade View: Grade I Tube type: Oral Tube size: 7.0 mm Number of attempts: 1 Airway Equipment and Method: Stylet Placement Confirmation: ETT inserted through vocal cords under direct vision,  positive ETCO2 and breath sounds checked- equal and bilateral Secured at: 21 cm Tube secured with: Tape Dental Injury: Teeth and Oropharynx as per pre-operative assessment

## 2018-12-17 ENCOUNTER — Encounter (HOSPITAL_COMMUNITY): Payer: Self-pay | Admitting: Neurosurgery

## 2018-12-17 ENCOUNTER — Ambulatory Visit: Payer: Medicare Other | Admitting: Cardiovascular Disease

## 2018-12-17 LAB — BASIC METABOLIC PANEL
Anion gap: 7 (ref 5–15)
BUN: 6 mg/dL (ref 6–20)
CO2: 21 mmol/L — ABNORMAL LOW (ref 22–32)
Calcium: 8.7 mg/dL — ABNORMAL LOW (ref 8.9–10.3)
Chloride: 107 mmol/L (ref 98–111)
Creatinine, Ser: 0.66 mg/dL (ref 0.44–1.00)
GFR calc Af Amer: >60 mL/min (ref >60)
GFR calc non Af Amer: >60 mL/min (ref >60)
Glucose, Bld: 140 mg/dL — ABNORMAL HIGH (ref 70–99)
Potassium: 3.9 mmol/L (ref 3.5–5.1)
Sodium: 135 mmol/L (ref 135–145)

## 2018-12-17 MED ORDER — PANTOPRAZOLE SODIUM 40 MG PO TBEC
40.0000 mg | DELAYED_RELEASE_TABLET | Freq: Every day | ORAL | Status: DC
  Administered 2018-12-17 – 2018-12-18 (×2): 40 mg via ORAL
  Filled 2018-12-17 (×2): qty 1

## 2018-12-17 NOTE — Progress Notes (Signed)
Subjective: Patient reports some moderate headaches 5/10 sometimes but mostly stiffness. Did really well overnight with no compications  Objective: Vital signs in last 24 hours: Temp:  [97 F (36.1 C)-98.3 F (36.8 C)] 98.1 F (36.7 C) (01/07 0400) Pulse Rate:  [54-110] 67 (01/07 0800) Resp:  [8-19] 17 (01/07 0800) BP: (100-131)/(64-102) 121/83 (01/07 0800) SpO2:  [90 %-100 %] 93 % (01/07 0800) Weight:  [77.1 kg] 77.1 kg (01/06 0930)  Intake/Output from previous day: 01/06 0701 - 01/07 0700 In: 2110.2 [I.V.:2010.2; IV Piggyback:100] Out: 1650 [Urine:1600; Blood:50] Intake/Output this shift: Total I/O In: 145.5 [I.V.:145.5] Out: -   Neurologic: Grossly normal  Lab Results: Lab Results  Component Value Date   WBC 14.4 (H) 12/16/2018   HGB 12.8 12/16/2018   HCT 42.3 12/16/2018   MCV 95.5 12/16/2018   PLT 221 12/16/2018   No results found for: INR, PROTIME BMET Lab Results  Component Value Date   NA 135 12/17/2018   K 3.9 12/17/2018   CL 107 12/17/2018   CO2 21 (L) 12/17/2018   GLUCOSE 140 (H) 12/17/2018   BUN 6 12/17/2018   CREATININE 0.66 12/17/2018   CALCIUM 8.7 (L) 12/17/2018    Studies/Results: No results found.  Assessment/Plan: Postop day 1 from chiari decompression. Pt looks great this morning. Will ambulate today, diet ordered, foley dc. Tolerating pain well. Transfer to stepdown.    LOS: 1 day    Tiana Loft Josy Peaden 12/17/2018, 8:21 AM

## 2018-12-17 NOTE — Plan of Care (Signed)
Pt has excellent appetite and consumes 100% of meals. Pt able to ambulate in room and to restroom w/ standby assist. Pt c/o posterior head/neck pain, able to reach tolerable pain level w/ PRN medications. Overall, pt states she is comfortable and eager to return home.

## 2018-12-18 MED ORDER — OXYCODONE-ACETAMINOPHEN 5-325 MG PO TABS
1.0000 | ORAL_TABLET | ORAL | Status: DC | PRN
  Administered 2018-12-18 – 2018-12-19 (×5): 1 via ORAL
  Filled 2018-12-18 (×6): qty 1

## 2018-12-18 NOTE — Progress Notes (Signed)
Subjective: Patient reports patient overall doing very well had episode of sneezing and coughing after receiving Vicodin increased neck pain following that.  Objective: Vital signs in last 24 hours: Temp:  [98.2 F (36.8 C)-99.5 F (37.5 C)] 99.3 F (37.4 C) (01/08 0400) Pulse Rate:  [69-91] 70 (01/07 1600) Resp:  [12-17] 16 (01/07 1650) BP: (100-131)/(67-94) 100/82 (01/08 0400) SpO2:  [90 %-98 %] 98 % (01/07 1650)  Intake/Output from previous day: 01/07 0701 - 01/08 0700 In: 558.6 [I.V.:558.6] Out: -  Intake/Output this shift: No intake/output data recorded.  awake alert neurologically nonfocal incision clean dry and intact  Lab Results: Recent Labs    12/16/18 2018  WBC 14.4*  HGB 12.8  HCT 42.3  PLT 221   BMET Recent Labs    12/16/18 2018 12/17/18 0440  NA 137 135  K 3.7 3.9  CL 108 107  CO2 21* 21*  GLUCOSE 141* 140*  BUN 9 6  CREATININE 0.94 0.66  CALCIUM 8.6* 8.7*    Studies/Results: No results found.  Assessment/Plan: Continue to mobilize today we'll discontinue the Vicodin will start her on Percocet continue to wean her steroids plan discharge in the morning  LOS: 2 days     Desiree Fleming P 12/18/2018, 9:23 AM

## 2018-12-19 MED ORDER — OXYCODONE-ACETAMINOPHEN 5-325 MG PO TABS
1.0000 | ORAL_TABLET | Freq: Once | ORAL | Status: AC
  Administered 2018-12-19: 1 via ORAL
  Filled 2018-12-19: qty 1

## 2018-12-19 MED ORDER — METHOCARBAMOL 500 MG PO TABS: 500.0000 mg | ORAL_TABLET | Freq: Four times a day (QID) | ORAL | 0 refills | Status: DC

## 2018-12-19 MED ORDER — METHOCARBAMOL 500 MG PO TABS
500.0000 mg | ORAL_TABLET | Freq: Three times a day (TID) | ORAL | Status: DC
  Administered 2018-12-19: 500 mg via ORAL
  Filled 2018-12-19: qty 1

## 2018-12-19 MED ORDER — OXYCODONE-ACETAMINOPHEN 5-325 MG PO TABS: 1.0000 | ORAL_TABLET | ORAL | 0 refills | Status: DC | PRN

## 2018-12-19 NOTE — Discharge Summary (Addendum)
Physician Discharge Summary  Patient ID: Mary [name] MRN: [id number] DOB/AGE: 01-07-1986 33 y.o.  Admit date: 12/16/2018 Discharge date: 12/19/2018  Admission Diagnoses: Chiari I malformation with tonsillar descension below the C1 lamina   Discharge Diagnoses: same   Discharged Condition: good  Hospital Course: The patient was admitted on 12/16/2018 and taken to the operating room where the patient underwent suboccipital craniectomy and C1 lami for decompression. The patient tolerated the procedure well and was taken to the recovery room and then to the ICU in stable condition. The hospital course was routine. There were no complications. The wound remained clean dry and intact. Pt had appropriate head and neck soreness. No complaints of arm pain or new N/T/W. The patient remained afebrile with stable vital signs, and tolerated a regular diet. The patient continued to increase activities, and pain was well controlled with oral pain medications.   Consults: None  Significant Diagnostic Studies:  Results for orders placed or performed during the hospital encounter of 12/16/18  MRSA PCR Screening  Result Value Ref Range   MRSA by PCR NEGATIVE NEGATIVE  Basic metabolic panel  Result Value Ref Range   Sodium 137 135 - 145 mmol/L   Potassium 3.7 3.5 - 5.1 mmol/L   Chloride 108 98 - 111 mmol/L   CO2 21 (L) 22 - 32 mmol/L   Glucose, Bld 141 (H) 70 - 99 mg/dL   BUN 9 6 - 20 mg/dL   Creatinine, Ser 5.620.94 0.44 - 1.00 mg/dL   Calcium 8.6 (L) 8.9 - 10.3 mg/dL   GFR calc non Af Amer >60 >60 mL/min   GFR calc Af Amer >60 >60 mL/min   Anion gap 8 5 - 15  CBC  Result Value Ref Range   WBC 14.4 (H) 4.0 - 10.5 K/uL   RBC 4.43 3.87 - 5.11 MIL/uL   Hemoglobin 12.8 12.0 - 15.0 g/dL   HCT 13.042.3 86.536.0 - 78.446.0 %   MCV 95.5 80.0 - 100.0 fL   MCH 28.9 26.0 - 34.0 pg   MCHC 30.3 30.0 - 36.0 g/dL   RDW 69.613.9 29.511.5 - 28.415.5 %   Platelets 221 150 - 400 K/uL   nRBC 0.0 0.0 - 0.2 %  Basic metabolic panel   Result Value Ref Range   Sodium 135 135 - 145 mmol/L   Potassium 3.9 3.5 - 5.1 mmol/L   Chloride 107 98 - 111 mmol/L   CO2 21 (L) 22 - 32 mmol/L   Glucose, Bld 140 (H) 70 - 99 mg/dL   BUN 6 6 - 20 mg/dL   Creatinine, Ser 1.320.66 0.44 - 1.00 mg/dL   Calcium 8.7 (L) 8.9 - 10.3 mg/dL   GFR calc non Af Amer >60 >60 mL/min   GFR calc Af Amer >60 >60 mL/min   Anion gap 7 5 - 15  Pregnancy, urine POC  Result Value Ref Range   Preg Test, Ur NEGATIVE NEGATIVE    No results found.  Antibiotics:  Anti-infectives (From admission, onward)   Start     Dose/Rate Route Frequency Ordered Stop   12/16/18 2100  ceFAZolin (ANCEF) IVPB 2g/100 mL premix     2 g 200 mL/hr over 30 Minutes Intravenous Every 8 hours 12/16/18 1859 12/17/18 0603   12/16/18 1315  bacitracin 50,000 Units in sodium chloride 0.9 % 500 mL irrigation  Status:  Discontinued       As needed 12/16/18 1316 12/16/18 1516   12/16/18 1115  ceFAZolin (ANCEF) IVPB 2g/100 mL  premix     2 g 200 mL/hr over 30 Minutes Intravenous On call to O.R. 12/16/18 0932 12/16/18 1253      Discharge Exam: Blood pressure 117/76, pulse 72, temperature 97.9 F (36.6 C), temperature source Oral, resp. rate 16, height 5\' 4"  (1.626 m), weight 77.1 kg, last menstrual period 11/22/2018, SpO2 92 %. Neurologic: Grossly normal Ambulating and voiding well, incision CDI  Discharge Medications:   Allergies as of 12/19/2018      Reactions   Codeine Shortness Of Breath, Other (See Comments)   Can not take in liquid form but does not recall exactly what formulation she had Has taken vicodin and percocet without problem per pt   Peanut-containing Drug Products Anaphylaxis   Sulfa Antibiotics Anaphylaxis, Itching      Medication List    TAKE these medications   acetaminophen 325 MG tablet Commonly known as:  TYLENOL Take 650 mg by mouth every 6 (six) hours as needed for moderate pain or headache. Reported on 05/01/2016   fluticasone 50 MCG/ACT nasal  spray Commonly known as:  FLONASE Place 2 sprays into both nostrils daily.   hydrOXYzine 10 MG tablet Commonly known as:  ATARAX/VISTARIL Take 1 tablet (10 mg total) by mouth 3 (three) times daily as needed.   methocarbamol 500 MG tablet Commonly known as:  ROBAXIN Take 1 tablet (500 mg total) by mouth 4 (four) times daily.   montelukast 10 MG tablet Commonly known as:  SINGULAIR Take 1 tablet (10 mg total) by mouth at bedtime.   oxyCODONE-acetaminophen 5-325 MG tablet Commonly known as:  PERCOCET/ROXICET Take 1 tablet by mouth every 4 (four) hours as needed for moderate pain.   SUMAtriptan 50 MG tablet Commonly known as:  IMITREX Take 1 tablet earliest onset of headache.  May repeat once in 2 hours if headache persists or recurs.   venlafaxine XR 37.5 MG 24 hr capsule Commonly known as:  EFFEXOR-XR TAKE 1 CAPSULE (37.5 MG TOTAL) BY MOUTH DAILY WITH BREAKFAST. What changed:  when to take this       Disposition: home   Final Dx: chiari decompression  Discharge Instructions    Call MD for:  difficulty breathing, headache or visual disturbances   Complete by:  As directed    Call MD for:  hives   Complete by:  As directed    Call MD for:  persistant dizziness or light-headedness   Complete by:  As directed    Call MD for:  persistant nausea and vomiting   Complete by:  As directed    Call MD for:  redness, tenderness, or signs of infection (pain, swelling, redness, odor or green/yellow discharge around incision site)   Complete by:  As directed    Call MD for:  severe uncontrolled pain   Complete by:  As directed    Call MD for:  temperature >100.4   Complete by:  As directed    Diet - low sodium heart healthy   Complete by:  As directed    Driving Restrictions   Complete by:  As directed    No driving for 2 weeks, no riding in the car for 1 week   Increase activity slowly   Complete by:  As directed    Remove dressing in 24 hours   Complete by:  As directed           Signed: Tiana Loft Meyran 12/19/2018, 7:36 AM

## 2018-12-19 NOTE — Progress Notes (Signed)
Pt being discharged home via wheelchair with family. Pt alert and oriented x4. VSS. Pt c/o no pain at this time. No signs of respiratory distress. Education complete and care plans resolved. IV removed with catheter intact and pt tolerated well. No further issues at this time. Pt to follow up with PCP. Jasaun Carn R, RN 

## 2018-12-19 NOTE — Care Management Obs Status (Signed)
MEDICARE OBSERVATION STATUS NOTIFICATION   Patient Details  Name: Mary [name] MRN: [id number] Date of Birth: 10/03/1986   Medicare Observation Status Notification Given:       Dorena Bodo 12/19/2018, 2:47 PM

## 2018-12-19 NOTE — Progress Notes (Signed)
Pt c/o pain with little relief from Percocet. Notified Kim, [place] with neurosurgery and orders received. Will continue to monitor pt closely. Charleene Callegari R,RN

## 2018-12-19 NOTE — Consult Note (Signed)
            [facility] CM Primary Care Navigator  12/19/2018  Mary [name] 01/12/1986 [id number]   Seenpatientand husband Casimiro Needle) at the bedsidetoidentify possible discharge needs.  Patientstates having long history of migraine headaches that failed conservative treatments. Further work-up showed Chiari I malformation with severe crowding tonsillar descension and was admitted for craniectomy for Chiari decompression and C1 laminectomy.   Patientreportsthat herprevious primary care providerwas Jannifer Rodney, NP with [facility]  But had [facility] (not a [facility] providerandnot affiliated with [facility] but with [employer]). Patient has appointment scheduled to see Dr. Otilio Miu for follow-up on 12/30/2018 at 1:05 pm.  Anticipateddischarge plan ishome per patient. Patientwas encouraged to keep her scheduled follow-up appointment with new primary care provider in order to get help managing her health issues/ needs after discharge. Patient mentioned of possible transportation issue to doctor's appointments. [employer] transportation benefits explained to patient and husband and both were grateful of information.    For additional questions please contact:  Karin Golden A. Angelise Petrich, BSN, RN-BC [facility] PRIMARY CARE Navigator Cell: [phone]

## 2018-12-30 ENCOUNTER — Ambulatory Visit: Payer: Self-pay | Admitting: Family Medicine

## 2018-12-30 DIAGNOSIS — G935 Compression of brain: Secondary | ICD-10-CM | POA: Diagnosis not present

## 2018-12-30 DIAGNOSIS — Z7689 Persons encountering health services in other specified circumstances: Secondary | ICD-10-CM | POA: Diagnosis not present
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
## 2019-01-15 ENCOUNTER — Ambulatory Visit: Payer: Self-pay | Admitting: Neurology

## 2019-01-28 ENCOUNTER — Ambulatory Visit: Payer: Self-pay | Admitting: Family Medicine

## 2019-02-24 DIAGNOSIS — H5319 Other subjective visual disturbances: Secondary | ICD-10-CM | POA: Diagnosis not present

## 2019-02-28 ENCOUNTER — Telehealth: Payer: Self-pay | Admitting: *Deleted

## 2019-02-28 NOTE — Telephone Encounter (Signed)
.     Cardiac Questionnaire:    Since your last visit or hospitalization:    1. Have you been having new or worsening chest pain? NO    2. Have you been having new or worsening shortness of breath? NO 3. Have you been having new or worsening leg swelling, wt gain, or increase in abdominal girth (pants fitting more tightly)? NO   4. Have you had any passing out spells? NO     *A YES to any of these questions would result in the appointment being kept. *If all the answers to these questions are NO, we should indicate that given the current situation regarding the worldwide coronarvirus pandemic, at the recommendation of the CDC, we are looking to limit gatherings in our waiting area, and thus will reschedule their appointment beyond four weeks from today.        Primary Cardiologist:  Chilton Si, MD   Patient contacted.  History reviewed.  No symptoms to suggest any unstable cardiac conditions.  Based on discussion, with current pandemic situation, we will be postponing this appointment If symptoms change, she has been instructed to contact our office.   Routing to C19 CANCEL pool for tracking and assigning priority 2  Regis Bill, LPN  3/89/3734 2:87 PM         .

## 2019-03-04 ENCOUNTER — Ambulatory Visit: Payer: Medicare Other | Admitting: Cardiovascular Disease

## 2019-03-12 ENCOUNTER — Encounter: Payer: Self-pay | Admitting: *Deleted

## 2019-03-21 NOTE — Progress Notes (Signed)
[name] [address] [place], [place] [address] Phone: [phone] Subjective:    Virtual Visit via Video Note  I connected with Mary [name] on 03/21/19 at  9:45 AM EDT by a video enabled telemedicine application and verified that I am speaking with the correct person using two identifiers.   I discussed the limitations of evaluation and management by telemedicine and the availability of in person appointments. The patient expressed understanding and agreed to proceed.    I discussed the assessment and treatment plan with the patient. The patient was provided an opportunity to ask questions and all were answered. The patient agreed with the plan and demonstrated an understanding of the instructions.   The patient was advised to call back or seek an in-person evaluation if the symptoms worsen or if the condition fails to improve as anticipated.  I provided 35 minutes of non-face-to-face time during this encounter.   Judi Saa, DO    CC: Headache follow-up  [id number]  Mary [name] is a 33 y.o. female coming in with complaint of headache and neck pain follow-up.  Had a Chiari malformation.  Recently did a decompression surgery in January of this year. Doing well, mild headaches, discussed that they are better, looking to be more active, still using pain meds and muscle relaxer fairly regularly.       Past Medical History:  Diagnosis Date  . Asthma   . Bronchitis 01/31/2016  . Chiari malformation type I (HCC)   . Depression   . GERD (gastroesophageal reflux disease)   . H/O multiple concussions   . Headache(784.0)   . History of palpitations   . Hx of fracture of nose   . Mental disorder   . Multiple fractures    Hx: of a leg and arm fracture as a child  . Pneumonia   . PONV (postoperative nausea and vomiting)   . Post traumatic stress disorder (PTSD)   . Pregnant 01/21/2016  . PTSD (post-traumatic stress disorder)   .  Sinus infection 01/31/2016  . Supervision of normal pregnancy, antepartum 02/08/2016    [facility] at             13+1 week FOB   Lashante [name] 33 yo WM Dating By  LMP and [place] Pap  02/08/16 GC/CT Initial:                36+wks: Genetic Screen NT/IT:  CF screen  Anatomic [place]  Flu vaccine  Tdap Recommended ~ 28wks Glucose Screen  2 hr GBS  Feed Preference  Contraception  Circumcision  Childbirth Classes  Pediatrician     Past Surgical History:  Procedure Laterality Date  . botox injections    . colposcopy    . DILATION AND CURETTAGE OF UTERUS    . nexplanon    . SUBOCCIPITAL CRANIECTOMY CERVICAL LAMINECTOMY N/A 12/16/2018   Procedure: Cervical one Laminectomy/Chiari decompression;  Surgeon: Donalee Citrin, MD;  Location: [facility] OR;  Service: Neurosurgery;  Laterality: N/A;  . WISDOM TOOTH EXTRACTION     Social History   Socioeconomic History  . Marital status: Legally Separated    Spouse name: Not on file  . Number of children: 4  . Years of education: Not on file  . Highest education level: GED or equivalent  Occupational History  . Occupation: disabled    Comment: PTSD  Social Needs  . Financial resource strain: Not on file  . Food  insecurity:    Worry: Not on file    Inability: Not on file  . Transportation needs:    Medical: Not on file    Non-medical: Not on file  Tobacco Use  . Smoking status: Current Every Day Smoker    Packs/day: 0.50    Types: Cigarettes  . Smokeless tobacco: Never Used  . Tobacco comment: NEVER USE SNUFF OR CHEWING TOBACCO  Substance and Sexual Activity  . Alcohol use: Yes    Comment: social  . Drug use: No  . Sexual activity: Yes    Birth control/protection: None, Condom  Lifestyle  . Physical activity:    Days per week: Not on file    Minutes per session: Not on file  . Stress: Not on file  Relationships  . Social connections:    Talks on phone: Not on file    Gets together: Not on file    Attends religious service: Not on file     Active member of club or organization: Not on file    Attends meetings of clubs or organizations: Not on file    Relationship status: Not on file  Other Topics Concern  . Not on file  Social History Narrative   Patient is right-handed. She is separated, has 4 sons. Is on disabilty for PTSD. She has one caffeine drink daily, walks 3 x week.   Allergies  Allergen Reactions  . Codeine Shortness Of Breath and Other (See Comments)    Can not take in liquid form but does not recall exactly what formulation she had Has taken vicodin and percocet without problem per pt  . Peanut-Containing Drug Products Anaphylaxis  . Sulfa Antibiotics Anaphylaxis and Itching   Family History  Problem Relation Age of Onset  . Cancer Mother [id number]       breast  . Hypertension Mother   . Depression Mother   . Depression Father   . Seizures Father   . Cancer Maternal Grandmother        breast and cervical  . Asthma Son   . Asthma Son   . Hypertension Other   . Cancer Other         Current Outpatient Medications (Respiratory):  .  fluticasone (FLONASE) 50 MCG/ACT nasal spray, Place 2 sprays into both nostrils daily.  .  montelukast (SINGULAIR) 10 MG tablet, Take 1 tablet (10 mg total) by mouth at bedtime.   Current Outpatient Medications (Analgesics):  .  acetaminophen (TYLENOL) 325 MG tablet, Take 650 mg by mouth every 6 (six) hours as needed for moderate pain or headache. Reported on 05/01/2016 .  oxyCODONE-acetaminophen (PERCOCET/ROXICET) 5-325 MG tablet, Take 1 tablet by mouth every 4 (four) hours as needed for moderate pain. .  SUMAtriptan (IMITREX) 50 MG tablet, Take 1 tablet earliest onset of headache.  May repeat once in 2 hours if headache persists or recurs.     Current Outpatient Medications (Other):  .  hydrOXYzine (ATARAX/VISTARIL) 10 MG tablet, Take 1 tablet (10 mg total) by mouth 3 (three) times daily as needed. .  methocarbamol (ROBAXIN) 500 MG tablet, Take 1 tablet (500 mg  total) by mouth 4 (four) times daily. Marland Kitchen.  venlafaxine XR (EFFEXOR-XR) 37.5 MG 24 hr capsule, TAKE 1 CAPSULE (37.5 MG TOTAL) BY MOUTH DAILY WITH BREAKFAST. (Patient taking differently: Take 37.5 mg by mouth daily with lunch. )  Current Facility-Administered Medications (Other):  Marland Kitchen.  Vitamin D (Ergocalciferol) (DRISDOL) capsule 50,000 Units    Past medical history, social, surgical  and family history all reviewed in electronic medical record.  No pertanent information unless stated regarding to the chief complaint.   Review of Systems:  No visual changes, nausea, vomiting, diarrhea, constipation, dizziness, abdominal pain, skin rash, fevers, chills, night sweats, weight loss, swollen lymph nodes, body aches, joint swelling, muscle aches, chest pain, shortness of breath, mood changes.  Positive headaches  Objective     General: No apparent distress alert and oriented x3 mood and affect normal, dressed appropriately.     Impression and Recommendations:     This case required medical decision making of moderate complexity. The above documentation has been reviewed and is accurate and complete Judi Saa, DO       Note: This dictation was prepared with Dragon dictation along with smaller phrase technology. Any transcriptional errors that result from this process are unintentional.

## 2019-03-24 ENCOUNTER — Encounter: Payer: Self-pay | Admitting: Family Medicine

## 2019-03-24 ENCOUNTER — Ambulatory Visit (INDEPENDENT_AMBULATORY_CARE_PROVIDER_SITE_OTHER): Payer: Medicare Other | Admitting: Family Medicine

## 2019-03-24 DIAGNOSIS — G935 Compression of brain: Secondary | ICD-10-CM

## 2019-03-24 DIAGNOSIS — G44209 Tension-type headache, unspecified, not intractable: Secondary | ICD-10-CM

## 2019-03-24 NOTE — Assessment & Plan Note (Signed)
S/p surgical intervention.  Discussed HEP discussed which activities to do  Emailed HEP to work on muscle tightness. Discussed medications  RTC or virtual in 3-4 weeks due to corona virus

## 2019-03-24 NOTE — Assessment & Plan Note (Signed)
Muscle tension discussed hydroxyzine. Think it will be better Discussed HEP  email of info including HEP  Discussed which activities to avoid.  Possible restart OMT in 6 weeks

## 2019-03-26 ENCOUNTER — Telehealth (INDEPENDENT_AMBULATORY_CARE_PROVIDER_SITE_OTHER): Payer: Medicare Other | Admitting: Cardiovascular Disease

## 2019-03-26 ENCOUNTER — Encounter: Payer: Self-pay | Admitting: Cardiovascular Disease

## 2019-03-26 DIAGNOSIS — R42 Dizziness and giddiness: Secondary | ICD-10-CM | POA: Diagnosis not present

## 2019-03-26 DIAGNOSIS — R002 Palpitations: Secondary | ICD-10-CM | POA: Diagnosis not present

## 2019-03-26 DIAGNOSIS — G935 Compression of brain: Secondary | ICD-10-CM

## 2019-03-26 NOTE — Patient Instructions (Signed)
Medication Instructions:  Your physician recommends that you continue on your current medications as directed. Please refer to the Current Medication list given to you today.  If you need a refill on your cardiac medications before your next appointment, please call your pharmacy.   Lab work: NONE  Testing/Procedures: NONE  Follow-Up: AS NEEDED   

## 2019-03-26 NOTE — Progress Notes (Signed)
Virtual Visit via Video Note   This visit type was conducted due to national recommendations for restrictions regarding the COVID-19 Pandemic (e.g. social distancing) in an effort to limit this patient's exposure and mitigate transmission in our community.  Due to her co-morbid illnesses, this patient is at least at moderate risk for complications without adequate follow up.  This format is felt to be most appropriate for this patient at this time.  All issues noted in this document were discussed and addressed.  A limited physical exam was performed with this format.  Please refer to the patient's chart for her consent to telehealth for [facility].   Evaluation Performed:  Follow-up visit  Date:  03/26/2019   ID:  Mary [name] Levay, DOB 12/17/1985, MRN [id number]  Patient Location: Home Provider Location: Office  PCP:  Kela MillinBarrino, Alethea Y, MD  Cardiologist:  Chilton Siiffany Hampden, MD  Electrophysiologist:  None   Chief Complaint:  Follow up  History of Present Illness:    Mary [name] Wellen is a 33 y.o. female with palpitations, near syncope, Chiari I malformation and PTSD who presents for follow-up.  She saw Corine ShelterLuke Kilroy, PA-C on 07/2018.  She would describe several episodes of near syncope and tachycardia.  She reported that she drink caffeine and vapes CBD oil.  He recommended that she limit the use of both of these and check a 7-day Holter monitor.  She had an echo 08/29/2018 that revealed normal systolic function and was otherwise unremarkable.  A 7 day Zio revealed sinus rhythm with PACs.  [name] underwent decompression surgery for Chiari on 12/2018.  Immediately after she felt relief.  She no longer had headaches or dizziness.  She continued to have some palpitations but much less than in the past.  She thinks that this was attributable to anxiety and panic attacks.  When it happens she gets chest pain and her heart races.  This never occurs with exertion.  She was at home recovering for 3  months and is now at home due to COVID-19.  She was started on atarax and this seems to have helped a lot.  She walks for exericse and has no exertional symptoms.  She has no lower extremity edema, orthopnea or PND.  She does have numbness in her hands L>R.     The patient does have symptoms concerning for COVID-19 infection (fever, chills, cough, or new shortness of breath).    Past Medical History:  Diagnosis Date   Asthma    Bronchitis 01/31/2016   Chiari malformation type I (HCC)    Depression    GERD (gastroesophageal reflux disease)    H/O multiple concussions    Headache(784.0)    History of palpitations    Hx of fracture of nose    Mental disorder    Multiple fractures    Hx: of a leg and arm fracture as a child   Pneumonia    PONV (postoperative nausea and vomiting)    Post traumatic stress disorder (PTSD)    Pregnant 01/21/2016   PTSD (post-traumatic stress disorder)    Sinus infection 01/31/2016   Supervision of normal pregnancy, antepartum 02/08/2016    [facility] at             13+1 week FOB   Fabienne BrunsMichael Lagan 33 yo WM Dating By  LMP and US Pap  02/08/16 GC/CT Initial:                36+wks:  Genetic Screen NT/IT:  CF screen  Anatomic [place]  Flu vaccine  Tdap Recommended ~ 28wks Glucose Screen  2 hr GBS  Feed Preference  Contraception  Circumcision  Childbirth Classes  Pediatrician     Past Surgical History:  Procedure Laterality Date   botox injections     colposcopy     DILATION AND CURETTAGE OF UTERUS     nexplanon     SUBOCCIPITAL CRANIECTOMY CERVICAL LAMINECTOMY N/A 12/16/2018   Procedure: Cervical one Laminectomy/Chiari decompression;  Surgeon: Donalee Citrin, MD;  Location: [facility];  Service: Neurosurgery;  Laterality: N/A;   WISDOM TOOTH EXTRACTION       Current Meds  Medication Sig   acetaminophen (TYLENOL) 325 MG tablet Take 650 mg by mouth every 6 (six) hours as needed for moderate pain or headache. Reported on 05/01/2016     fluticasone (FLONASE) 50 MCG/ACT nasal spray Place 2 sprays into both nostrils daily.    hydrOXYzine (ATARAX/VISTARIL) 10 MG tablet Take 1 tablet (10 mg total) by mouth 3 (three) times daily as needed.   methocarbamol (ROBAXIN) 500 MG tablet Take 1 tablet (500 mg total) by mouth 4 (four) times daily.   montelukast (SINGULAIR) 10 MG tablet Take 1 tablet (10 mg total) by mouth at bedtime.   oxyCODONE-acetaminophen (PERCOCET/ROXICET) 5-325 MG tablet Take 1 tablet by mouth every 4 (four) hours as needed for moderate pain.     Allergies:   Codeine; Peanut-containing drug products; and Sulfa antibiotics   Social History   Tobacco Use   Smoking status: Current Every Day Smoker    Packs/day: 0.50    Types: Cigarettes   Smokeless tobacco: Never Used   Tobacco comment: NEVER USE SNUFF OR CHEWING TOBACCO  Substance Use Topics   Alcohol use: Yes    Comment: social   Drug use: No     Family Hx: The patient's family history includes Asthma in her son and son; Cancer in her maternal grandmother and another family member; Cancer (age of onset: 65) in her mother; Depression in her father and mother; Hypertension in her mother and another family member; Seizures in her father.  ROS:   Please see the history of present illness.     All other systems reviewed and are negative.   Prior CV studies:   The following studies were reviewed today:  Echo 08/29/18:  Study Conclusions  - Left ventricle: The cavity size was normal. Wall thickness was normal. Systolic function was normal. The estimated ejection fraction was in the range of 60% to 65%. Wall motion was normal; there were no regional wall motion abnormalities. Left ventricular diastolic function parameters were normal. - Aortic valve: Valve area (VTI): 1.98 cm^2. Valve area (Vmax): 1.83 cm^2. Valve area (Vmean): 1.9 cm^2. - Technically adequate study.   Labs/Other Tests and Data Reviewed:    EKG:  No ECG  reviewed.  Recent Labs: 06/25/2018: ALT 14; TSH 1.44 12/16/2018: Hemoglobin 12.8; Platelets 221 12/17/2018: BUN 6; Creatinine, Ser 0.66; Potassium 3.9; Sodium 135   Recent Lipid Panel No results found for: CHOL, TRIG, HDL, CHOLHDL, LDLCALC, LDLDIRECT  Wt Readings from Last 3 Encounters:  12/16/18 170 lb (77.1 kg)  12/10/18 175 lb 8 oz (79.6 kg)  12/10/18 176 lb (79.8 kg)     Objective:    Ht  (1.626 m)    BMI 29.18 kg/m  GENERAL: Well-appearing.  No acute distress. HEENT: Pupils equal round.  Oral mucosa unremarkable NECK:  No jugular venous distention, no visible thyromegaly  EXT:  No edema, no cyanosis no clubbing SKIN:  No rashes no nodules NEURO:  Speech fluent.  Cranial nerves grossly intact.  Moves all 4 extremities freely PSYCH:  Cognitively intact, oriented to person place and time   ASSESSMENT & PLAN:    # Near syncope: # Dizziness: Symptoms have improved significantly after Chiari decompression surgery.  # Palpitations: PACs seen on monitor.  Improved with treatment of anxiety. No additional interventions required.   COVID-19 Education: The signs and symptoms of COVID-19 were discussed with the patient and how to seek care for testing (follow up with PCP or arrange E-visit).  The importance of social distancing was discussed today.  Time:   Today, I have spent 15 minutes with the patient with telehealth technology discussing the above problems.     Medication Adjustments/Labs and Tests Ordered: Current medicines are reviewed at length with the patient today.  Concerns regarding medicines are outlined above.   Tests Ordered: No orders of the defined types were placed in this encounter.   Medication Changes: No orders of the defined types were placed in this encounter.   Disposition:  Follow up prn  Signed, Chilton Si, MD  03/26/2019 8:50 PM    [facility]

## 2019-03-27 ENCOUNTER — Ambulatory Visit (INDEPENDENT_AMBULATORY_CARE_PROVIDER_SITE_OTHER): Payer: Medicare Other | Admitting: Family Medicine

## 2019-03-27 ENCOUNTER — Encounter: Payer: Self-pay | Admitting: Family Medicine

## 2019-03-27 ENCOUNTER — Telehealth: Payer: Self-pay

## 2019-03-27 DIAGNOSIS — G935 Compression of brain: Secondary | ICD-10-CM | POA: Diagnosis not present

## 2019-03-27 NOTE — Telephone Encounter (Signed)
Patient called stating that she is having severe headaches and neck pain since last speaking with Dr. Katrinka Blazing. Is using Atarax and hydroxizine. Feels that these medications have helped her nausea and panic attacks. Would like to discuss current symptoms further via a virtual visit. Scheduled for this afternoon.

## 2019-03-27 NOTE — Progress Notes (Signed)
[name] [address] [place], [place] [address] Phone: [phone] Subjective:    Virtual Visit via Video Note  I connected with Mary [name] on 03/27/19 at  1:45 PM EDT by a video enabled telemedicine application and verified that I am speaking with the correct person using two identifiers.   I discussed the limitations of evaluation and management by telemedicine and the availability of in person appointments. The patient expressed understanding and agreed to proceed.  Patient was in her home setting and was in the office setting.  We were the only 2 that was on the virtual platform    I discussed the assessment and treatment plan with the patient. The patient was provided an opportunity to ask questions and all were answered. The patient agreed with the plan and demonstrated an understanding of the instructions.   The patient was advised to call back or seek an in-person evaluation if the symptoms worsen or if the condition fails to improve as anticipated.  I provided 29 minutes of non-face-to-face time during this encounter.   Judi Saa, DO    CC: Headache follow-up  [id number]  Mary [name] is a 33 y.o. female coming in with complaint of headache.  Patient status post decompression for Chiari malformation type I.  Put on hydroxyzine and Robaxin after last video platform secondary to headaches and nausea and anxiety.  Doing significantly better at this time.  Patient states anxiety was significantly improved.  Unfortunately had a severe headache.  Was unable to even sit up without severe amount of pain in the head and neck.  States her back pain and anxiety though has been the best it has been in years.      Past Medical History:  Diagnosis Date  . Asthma   . Bronchitis 01/31/2016  . Chiari malformation type I (HCC)   . Depression   . GERD (gastroesophageal reflux disease)   . H/O multiple concussions   . Headache(784.0)    . History of palpitations   . Hx of fracture of nose   . Mental disorder   . Multiple fractures    Hx: of a leg and arm fracture as a child  . Pneumonia   . PONV (postoperative nausea and vomiting)   . Post traumatic stress disorder (PTSD)   . Pregnant 01/21/2016  . PTSD (post-traumatic stress disorder)   . Sinus infection 01/31/2016  . Supervision of normal pregnancy, antepartum 02/08/2016    [facility]             13+1 week FOB   Mary [name] 33 yo WM Dating By  LMP and [place] Pap  02/08/16 GC/CT Initial:                36+wks: Genetic Screen NT/IT:  CF screen  Anatomic [place]  Flu vaccine  Tdap Recommended ~ 28wks Glucose Screen  2 hr GBS  Feed Preference  Contraception  Circumcision  Childbirth Classes  Pediatrician     Past Surgical History:  Procedure Laterality Date  . botox injections    . colposcopy    . DILATION AND CURETTAGE OF UTERUS    . nexplanon    . SUBOCCIPITAL CRANIECTOMY CERVICAL LAMINECTOMY N/A 12/16/2018   Procedure: Cervical one Laminectomy/Chiari decompression;  Surgeon: Donalee Citrin, MD;  Location: [facility] OR;  Service: Neurosurgery;  Laterality: N/A;  . WISDOM TOOTH EXTRACTION     Social History   Socioeconomic History  .  Marital status: Legally Separated    Spouse name: Not on file  . Number of children: 4  . Years of education: Not on file  . Highest education level: GED or equivalent  Occupational History  . Occupation: disabled    Comment: PTSD  Social Needs  . Financial resource strain: Not on file  . Food insecurity:    Worry: Not on file    Inability: Not on file  . Transportation needs:    Medical: Not on file    Non-medical: Not on file  Tobacco Use  . Smoking status: Current Every Day Smoker    Packs/day: 0.50    Types: Cigarettes  . Smokeless tobacco: Never Used  . Tobacco comment: NEVER USE SNUFF OR CHEWING TOBACCO  Substance and Sexual Activity  . Alcohol use: Yes    Comment: social  . Drug use: No  . Sexual activity:  Yes    Birth control/protection: None, Condom  Lifestyle  . Physical activity:    Days per week: Not on file    Minutes per session: Not on file  . Stress: Not on file  Relationships  . Social connections:    Talks on phone: Not on file    Gets together: Not on file    Attends religious service: Not on file    Active member of club or organization: Not on file    Attends meetings of clubs or organizations: Not on file    Relationship status: Not on file  Other Topics Concern  . Not on file  Social History Narrative   Patient is right-handed. She is separated, has 4 sons. Is on disabilty for PTSD. She has one caffeine drink daily, walks 3 x week.   Allergies  Allergen Reactions  . Codeine Shortness Of Breath and Other (See Comments)    Can not take in liquid form but does not recall exactly what formulation she had Has taken vicodin and percocet without problem per pt  . Peanut-Containing Drug Products Anaphylaxis  . Sulfa Antibiotics Anaphylaxis and Itching   Family History  Problem Relation Age of Onset  . Cancer Mother 847       breast  . Hypertension Mother   . Depression Mother   . Depression Father   . Seizures Father   . Cancer Maternal Grandmother        breast and cervical  . Asthma Son   . Asthma Son   . Hypertension Other   . Cancer Other       Current Outpatient Medications (Respiratory):  .  fluticasone (FLONASE) 50 MCG/ACT nasal spray, Place 2 sprays into both nostrils daily.  .  montelukast (SINGULAIR) 10 MG tablet, Take 1 tablet (10 mg total) by mouth at bedtime.  Current Outpatient Medications (Analgesics):  .  acetaminophen (TYLENOL) 325 MG tablet, Take 650 mg by mouth every 6 (six) hours as needed for moderate pain or headache. Reported on 05/01/2016 .  oxyCODONE-acetaminophen (PERCOCET/ROXICET) 5-325 MG tablet, Take 1 tablet by mouth every 4 (four) hours as needed for moderate pain.   Current Outpatient Medications (Other):  .  hydrOXYzine  (ATARAX/VISTARIL) 10 MG tablet, Take 1 tablet (10 mg total) by mouth 3 (three) times daily as needed. .  methocarbamol (ROBAXIN) 500 MG tablet, Take 1 tablet (500 mg total) by mouth 4 (four) times daily.    Past medical history, social, surgical and family history all reviewed in electronic medical record.  No pertanent information unless stated regarding to the chief  complaint.   Review of Systems:  No , visual changes, nausea, vomiting, diarrhea, constipation, dizziness, abdominal pain, skin rash, fevers, chills, night sweats, weight loss, swollen lymph nodes, , joint swelling,  chest pain, shortness of breath, mood changes.  Positive muscle soreness, headaches  Objective      General: No apparent distress alert and oriented x3 mood and affect normal, dressed appropriately.  HEENT: Pupils equal, extraocular movements intact  Respiratory: Patient's speak in full sentences and does not appear short of breath      Impression and Recommendations:     This case required medical decision making of moderate complexity. The above documentation has been reviewed and is accurate and complete Judi Saa, DO       Note: This dictation was prepared with Dragon dictation along with smaller phrase technology. Any transcriptional errors that result from this process are unintentional.

## 2019-03-27 NOTE — Assessment & Plan Note (Signed)
History of a Chiari malformation status post decompression.  Patient does have anxiety and feels that the hydroxyzine has been beneficial but a possible side effect could be some of the headaches.  We will discuss this with patient.  Patient could also be having an exacerbation secondary to the muscle relaxer.  Discussed with patient again about icing regimen posture and ergonomics and how to break it.  Follow-up with me again in 5 days to make sure patient is improving.

## 2019-03-31 ENCOUNTER — Encounter: Payer: Self-pay | Admitting: Family Medicine

## 2019-03-31 ENCOUNTER — Ambulatory Visit (INDEPENDENT_AMBULATORY_CARE_PROVIDER_SITE_OTHER): Payer: Medicare Other | Admitting: Family Medicine

## 2019-03-31 DIAGNOSIS — F329 Major depressive disorder, single episode, unspecified: Secondary | ICD-10-CM

## 2019-03-31 DIAGNOSIS — F32A Depression, unspecified: Secondary | ICD-10-CM

## 2019-03-31 NOTE — Assessment & Plan Note (Signed)
Patient believe that more of his depression causing contributing to a lot of the discomfort.  Discussed with patient to try the hydroxyzine at half dose during the day.  If no improvement patient will ask mother which she has used in the past.  I would consider BuSpar as an alternative.  Patient wants to avoid any type of daily medications if possible.  We discussed with patient though limiting many factors with her having some mild allergic reactions.  Patient will send a MyChart message in the near future and if continues have difficulty we will need to consider another virtual visit.

## 2019-03-31 NOTE — Progress Notes (Signed)
[name] [address] [place], [place] [address] Phone: [phone] Subjective:    Virtual Visit via Video Note  I connected with [name] on 03/31/19 at 11:45 AM EDT by a video enabled telemedicine application and verified that I am speaking with the correct person using two identifiers.   I discussed the limitations of evaluation and management by telemedicine and the availability of in person appointments. The patient expressed understanding and agreed to proceed.  She was on the video platform at her home residence and I was in the office setting.    I discussed the assessment and treatment plan with the patient. The patient was provided an opportunity to ask questions and all were answered. The patient agreed with the plan and demonstrated an understanding of the instructions.   The patient was advised to call back or seek an in-person evaluation if the symptoms worsen or if the condition fails to improve as anticipated.  I provided 10 minutes of non-face-to-face time during this encounter.   Judi Saa, DO    CC: Headaches follow-up  [id number]  MCKENNZIE GORT is a 33 y.o. female coming in with complaint of headache.  Patient does have a Chiari malformation type I that is status post decompression. Acute onset of neck and headaches.  Patient was started on hydroxyzine to help with her anxiety and was helping significantly with this.  Was given a muscle relaxer but seem to be giving her more headaches in the morning.  Patient was to discontinue the muscle relaxer through the weekend.  Patient states since discontinuing the muscle relaxer the headaches are completely resolved.  Patient now states that the Atarax that did help her with her anxiety though seem to give her some mild difficulty breathing at night.  Patient is going to try during the day.  Patient was wondering what all other alternative treatments would be available.     Past Medical History:  Diagnosis Date  . Asthma   . Bronchitis 01/31/2016  . Chiari malformation type I (HCC)   . Depression   . GERD (gastroesophageal reflux disease)   . H/O multiple concussions   . Headache(784.0)   . History of palpitations   . Hx of fracture of nose   . Mental disorder   . Multiple fractures    Hx: of a leg and arm fracture as a child  . Pneumonia   . PONV (postoperative nausea and vomiting)   . Post traumatic stress disorder (PTSD)   . Pregnant 01/21/2016  . PTSD (post-traumatic stress disorder)   . Sinus infection 01/31/2016  . Supervision of normal pregnancy, antepartum 02/08/2016    [facility]             13+1 week FOB   Chanyah Isaak 33 yo WM Dating By  LMP and [place] Pap  02/08/16 GC/CT Initial:                36+wks: Genetic Screen NT/IT:  CF screen  Anatomic [place]  Flu vaccine  Tdap Recommended ~ 28wks Glucose Screen  2 hr GBS  Feed Preference  Contraception  Circumcision  Childbirth Classes  Pediatrician     Past Surgical History:  Procedure Laterality Date  . botox injections    . colposcopy    . DILATION AND CURETTAGE OF UTERUS    . nexplanon    . SUBOCCIPITAL CRANIECTOMY CERVICAL LAMINECTOMY N/A 12/16/2018   Procedure: Cervical  one Laminectomy/Chiari decompression;  Surgeon: Donalee Citrin, MD;  Location: [facility] OR;  Service: Neurosurgery;  Laterality: N/A;  . WISDOM TOOTH EXTRACTION     Social History   Socioeconomic History  . Marital status: Legally Separated    Spouse name: Not on file  . Number of children: 4  . Years of education: Not on file  . Highest education level: GED or equivalent  Occupational History  . Occupation: disabled    Comment: PTSD  Social Needs  . Financial resource strain: Not on file  . Food insecurity:    Worry: Not on file    Inability: Not on file  . Transportation needs:    Medical: Not on file    Non-medical: Not on file  Tobacco Use  . Smoking status: Current Every Day Smoker    Packs/day:  0.50    Types: Cigarettes  . Smokeless tobacco: Never Used  . Tobacco comment: NEVER USE SNUFF OR CHEWING TOBACCO  Substance and Sexual Activity  . Alcohol use: Yes    Comment: social  . Drug use: No  . Sexual activity: Yes    Birth control/protection: None, Condom  Lifestyle  . Physical activity:    Days per week: Not on file    Minutes per session: Not on file  . Stress: Not on file  Relationships  . Social connections:    Talks on phone: Not on file    Gets together: Not on file    Attends religious service: Not on file    Active member of club or organization: Not on file    Attends meetings of clubs or organizations: Not on file    Relationship status: Not on file  Other Topics Concern  . Not on file  Social History Narrative   Patient is right-handed. She is separated, has 4 sons. Is on disabilty for PTSD. She has one caffeine drink daily, walks 3 x week.   Allergies  Allergen Reactions  . Codeine Shortness Of Breath and Other (See Comments)    Can not take in liquid form but does not recall exactly what formulation she had Has taken vicodin and percocet without problem per pt  . Peanut-Containing Drug Products Anaphylaxis  . Sulfa Antibiotics Anaphylaxis and Itching   Family History  Problem Relation Age of Onset  . Cancer Mother 97       breast  . Hypertension Mother   . Depression Mother   . Depression Father   . Seizures Father   . Cancer Maternal Grandmother        breast and cervical  . Asthma Son   . Asthma Son   . Hypertension Other   . Cancer Other       Current Outpatient Medications (Respiratory):  .  fluticasone (FLONASE) 50 MCG/ACT nasal spray, Place 2 sprays into both nostrils daily.  .  montelukast (SINGULAIR) 10 MG tablet, Take 1 tablet (10 mg total) by mouth at bedtime.  Current Outpatient Medications (Analgesics):  .  acetaminophen (TYLENOL) 325 MG tablet, Take 650 mg by mouth every 6 (six) hours as needed for moderate pain or  headache. Reported on 05/01/2016 .  oxyCODONE-acetaminophen (PERCOCET/ROXICET) 5-325 MG tablet, Take 1 tablet by mouth every 4 (four) hours as needed for moderate pain.   Current Outpatient Medications (Other):  .  hydrOXYzine (ATARAX/VISTARIL) 10 MG tablet, Take 1 tablet (10 mg total) by mouth 3 (three) times daily as needed. .  methocarbamol (ROBAXIN) 500 MG tablet, Take 1 tablet (500  mg total) by mouth 4 (four) times daily.    Past medical history, social, surgical and family history all reviewed in electronic medical record.  No pertanent information unless stated regarding to the chief complaint.   Review of Systems:  No visual changes, nausea, vomiting, diarrhea, constipation, dizziness, abdominal pain, skin rash, fevers, chills, night sweats, weight loss, swollen lymph nodes, body aches, joint swelling, chest pain, shortness of breath, mood changes.  Positive headaches and muscle aches  Objective    General: No apparent distress alert and oriented x3 mood and affect normal, dressed appropriately.  HEENT: Pupils equal, extraocular movements intact   tone of shoulders, elbows, wrist, hip, knee and ankles bilaterally.    Impression and Recommendations:     This case required medical decision making of moderate complexity. The above documentation has been reviewed and is accurate and complete Judi SaaZachary M Shatonya Passon, DO       Note: This dictation was prepared with Dragon dictation along with smaller phrase technology. Any transcriptional errors that result from this process are unintentional.

## 2019-04-01 DIAGNOSIS — Z0389 Encounter for observation for other suspected diseases and conditions ruled out: Secondary | ICD-10-CM | POA: Diagnosis not present

## 2019-04-01 DIAGNOSIS — M79675 Pain in left toe(s): Secondary | ICD-10-CM | POA: Diagnosis not present

## 2019-04-01 DIAGNOSIS — M79676 Pain in unspecified toe(s): Secondary | ICD-10-CM | POA: Diagnosis not present

## 2019-04-01 DIAGNOSIS — L02612 Cutaneous abscess of left foot: Secondary | ICD-10-CM | POA: Diagnosis not present

## 2019-04-21 ENCOUNTER — Encounter: Payer: Self-pay | Admitting: Family Medicine

## 2019-04-22 ENCOUNTER — Other Ambulatory Visit: Payer: Self-pay

## 2019-04-22 DIAGNOSIS — M542 Cervicalgia: Secondary | ICD-10-CM

## 2019-04-22 DIAGNOSIS — G8929 Other chronic pain: Secondary | ICD-10-CM

## 2019-04-22 DIAGNOSIS — R519 Headache, unspecified: Secondary | ICD-10-CM

## 2019-04-22 NOTE — Progress Notes (Unsigned)
RI

## 2019-04-24 ENCOUNTER — Encounter: Payer: Self-pay | Admitting: Family Medicine

## 2019-04-25 ENCOUNTER — Encounter: Payer: Self-pay | Admitting: Family Medicine

## 2019-04-25 ENCOUNTER — Other Ambulatory Visit: Payer: Self-pay

## 2019-04-25 ENCOUNTER — Ambulatory Visit (INDEPENDENT_AMBULATORY_CARE_PROVIDER_SITE_OTHER): Payer: Medicare Other | Admitting: Family Medicine

## 2019-04-25 ENCOUNTER — Other Ambulatory Visit: Payer: Self-pay | Admitting: *Deleted

## 2019-04-25 ENCOUNTER — Ambulatory Visit (INDEPENDENT_AMBULATORY_CARE_PROVIDER_SITE_OTHER)
Admission: RE | Admit: 2019-04-25 | Discharge: 2019-04-25 | Disposition: A | Discharge status: Home / Self Care | Payer: Medicare Other | Source: Ambulatory Visit | Attending: Family Medicine | Admitting: Family Medicine

## 2019-04-25 VITALS — BP 100/82 | HR 90 | Ht 64.0 in | Wt 169.0 lb

## 2019-04-25 DIAGNOSIS — M542 Cervicalgia: Secondary | ICD-10-CM | POA: Diagnosis not present

## 2019-04-25 DIAGNOSIS — G935 Compression of brain: Secondary | ICD-10-CM | POA: Diagnosis not present

## 2019-04-25 DIAGNOSIS — G4489 Other headache syndrome: Secondary | ICD-10-CM

## 2019-04-25 MED ORDER — KETOROLAC TROMETHAMINE 60 MG/2ML IM SOLN
60.0000 mg | Freq: Once | INTRAMUSCULAR | Status: AC
  Administered 2019-04-25: 60 mg via INTRAMUSCULAR

## 2019-04-25 MED ORDER — METHYLPREDNISOLONE ACETATE 80 MG/ML IJ SUSP
80.0000 mg | Freq: Once | INTRAMUSCULAR | Status: AC
  Administered 2019-04-25: 13:00:00 80 mg via INTRAMUSCULAR

## 2019-04-25 MED ORDER — GABAPENTIN 100 MG PO CAPS: 200.0000 mg | ORAL_CAPSULE | Freq: Every day | ORAL | 3 refills | Status: DC

## 2019-04-25 NOTE — Assessment & Plan Note (Addendum)
Worsening headaches with patient actually 4 months out from the surgery.  Likely patient was doing well but I do feel that a repeat advanced imaging is necessary at this time.  Patient knows if any nausea, vomiting, visual changes occur the patient is to seek medical attention immediately.  We discussed the possibility of steroid-induced but patient would like to hold off on that secondary to some side effects.  We discussed that this could decrease the amount of pressure of the brain at the moment.  If he felt like it was a concern.  Discussed effectiveness of doing which wants to avoid.  Follow-up

## 2019-04-25 NOTE — Patient Instructions (Addendum)
Good to see you  Ice is your friend 2 injections today  Gabapentin 200mg  at night should help a lot I am hoping.  Stay active We will get MRI and call [phone] See me again in 3 weeks

## 2019-04-25 NOTE — Progress Notes (Signed)
[name] [address] [place], [place] [address] Phone: [phone] Subjective:   I Mary [name] am serving as a Neurosurgeon for Dr. Antoine Primas. CC: Neck pain and headache  LGX:[id number]  Mary [name] is a 33 y.o. female coming in with complaint of neck pain. Pain all over. From the neck to the bottom of her foot (heel). Bruising on the calf. Left leg pain. Has been laying in bed since monday. Wednesday states that she felt she had a fever in her left leg (calf). Has been having muscle spasms all over her body.  Patient has a past medical history significant for Chiari malformation status post decompression.  This was done in January.  Seem to be doing relatively well but was starting to have increasing discomfort as well as headaches.  Patient states that it got severe.  9 out of 10 pain.  Started affecting daily activities.  Patient states was unable to even sleep.  New medications including the gabapentin and the muscle relaxer was not helping. Patient is concerned something is wrong.  States that she did have some shooting pain that went down her legs.   Past Medical History:  Diagnosis Date  . Asthma   . Bronchitis 01/31/2016  . Chiari malformation type I (HCC)   . Depression   . GERD (gastroesophageal reflux disease)   . H/O multiple concussions   . Headache(784.0)   . History of palpitations   . Hx of fracture of nose   . Mental disorder   . Multiple fractures    Hx: of a leg and arm fracture as a child  . Pneumonia   . PONV (postoperative nausea and vomiting)   . Post traumatic stress disorder (PTSD)   . Pregnant 01/21/2016  . PTSD (post-traumatic stress disorder)   . Sinus infection 01/31/2016  . Supervision of normal pregnancy, antepartum 02/08/2016    [facility]             13+1 week FOB   Mary [name] 33 yo WM Dating By  LMP and [place] Pap  02/08/16 GC/CT Initial:                36+wks: Genetic Screen NT/IT:  CF  screen  Anatomic [place]  Flu vaccine  Tdap Recommended ~ 28wks Glucose Screen  2 hr GBS  Feed Preference  Contraception  Circumcision  Childbirth Classes  Pediatrician     Past Surgical History:  Procedure Laterality Date  . botox injections    . colposcopy    . DILATION AND CURETTAGE OF UTERUS    . nexplanon    . SUBOCCIPITAL CRANIECTOMY CERVICAL LAMINECTOMY N/A 12/16/2018   Procedure: Cervical one Laminectomy/Chiari decompression;  Surgeon: Donalee Citrin, MD;  Location: [facility] OR;  Service: Neurosurgery;  Laterality: N/A;  . WISDOM TOOTH EXTRACTION     Social History   Socioeconomic History  . Marital status: Legally Separated    Spouse name: Not on file  . Number of children: 4  . Years of education: Not on file  . Highest education level: GED or equivalent  Occupational History  . Occupation: disabled    Comment: PTSD  Social Needs  . Financial resource strain: Not on file  . Food insecurity:    Worry: Not on file    Inability: Not on file  . Transportation needs:    Medical: Not on file    Non-medical: Not on file  Tobacco Use  .  Smoking status: Current Every Day Smoker    Packs/day: 0.50    Types: Cigarettes  . Smokeless tobacco: Never Used  . Tobacco comment: NEVER USE SNUFF OR CHEWING TOBACCO  Substance and Sexual Activity  . Alcohol use: Yes    Comment: social  . Drug use: No  . Sexual activity: Yes    Birth control/protection: None, Condom  Lifestyle  . Physical activity:    Days per week: Not on file    Minutes per session: Not on file  . Stress: Not on file  Relationships  . Social connections:    Talks on phone: Not on file    Gets together: Not on file    Attends religious service: Not on file    Active member of club or organization: Not on file    Attends meetings of clubs or organizations: Not on file    Relationship status: Not on file  Other Topics Concern  . Not on file  Social History Narrative   Patient is right-handed. She is separated, has 4 sons.  Is on disabilty for PTSD. She has one caffeine drink daily, walks 3 x week.   Allergies  Allergen Reactions  . Codeine Shortness Of Breath and Other (See Comments)    Can not take in liquid form but does not recall exactly what formulation she had Has taken vicodin and percocet without problem per pt  . Peanut-Containing Drug Products Anaphylaxis  . Sulfa Antibiotics Anaphylaxis and Itching   Family History  Problem Relation Age of Onset  . Cancer Mother 19       breast  . Hypertension Mother   . Depression Mother   . Depression Father   . Seizures Father   . Cancer Maternal Grandmother        breast and cervical  . Asthma Son   . Asthma Son   . Hypertension Other   . Cancer Other       Current Outpatient Medications (Respiratory):  .  fluticasone (FLONASE) 50 MCG/ACT nasal spray, Place 2 sprays into both nostrils daily.  .  montelukast (SINGULAIR) 10 MG tablet, Take 1 tablet (10 mg total) by mouth at bedtime.  Current Outpatient Medications (Analgesics):  .  acetaminophen (TYLENOL) 325 MG tablet, Take 650 mg by mouth every 6 (six) hours as needed for moderate pain or headache. Reported on 05/01/2016 .  oxyCODONE-acetaminophen (PERCOCET/ROXICET) 5-325 MG tablet, Take 1 tablet by mouth every 4 (four) hours as needed for moderate pain.   Current Outpatient Medications (Other):  .  hydrOXYzine (ATARAX/VISTARIL) 10 MG tablet, Take 1 tablet (10 mg total) by mouth 3 (three) times daily as needed. .  methocarbamol (ROBAXIN) 500 MG tablet, Take 1 tablet (500 mg total) by mouth 4 (four) times daily. Marland Kitchen  gabapentin (NEURONTIN) 100 MG capsule, Take 2 capsules (200 mg total) by mouth at bedtime.    Past medical history, social, surgical and family history all reviewed in electronic medical record.  No pertanent information unless stated regarding to the chief complaint.   Review of Systems:  No headache, visual changes, nausea, vomiting, diarrhea, constipation, dizziness, abdominal  pain, skin rash, fevers, chills, night sweats, weight loss, swollen lymph nodes, body aches, joint swelling,chest pain, shortness of breath, mood changes.  Positive muscle aches  Objective  Blood pressure 100/82, pulse 90, height  (1.626 m), weight 169 lb (76.7 kg), last menstrual period 04/24/2019, SpO2 98 %.    General: No apparent distress alert and oriented x3 mood and  affect normal, dressed appropriately.  Appears to be significantly uncomfortable HEENT: Pupils equal, extraocular movements intact  Respiratory: Patient's speak in full sentences and does not appear short of breath  Cardiovascular: No lower extremity edema, non tender, no erythema  Skin: Warm dry intact with no signs of infection or rash on extremities or on axial skeleton.  Abdomen: Soft nontender  Neuro: Cranial nerves II through XII are intact, neurovascularly intact in all extremities with 2+ DTRs and 2+ pulses.  Lymph: No lymphadenopathy of posterior or anterior cervical chain or axillae bilaterally.  Gait normal with good balance and coordination.  MSK:  Non tender with full range of motion and good stability and symmetric strength and tone of shoulders, elbows, wrist, hip, knee and ankles bilaterally.  Exam shows a lack of the last 10 degrees of forward flexion.  Patient does have only 10 degrees of extension.  Worsening pain with some mild pain going down the arms.  Patient states most of the pain seems to be in the head.  Patient appears to have full strength of the upper extremities and the lower extremities.  Deep tendon reflexes are all intact.  No signs of any numbness with moving any of the extremities or with palpation.    Impression and Recommendations:     This case required medical decision making of moderate complexity. The above documentation has been reviewed and is accurate and complete Judi SaaZachary M Ayomide Purdy, DO       Note: This dictation was prepared with Dragon dictation along with smaller phrase  technology. Any transcriptional errors that result from this process are unintentional.

## 2019-04-29 DIAGNOSIS — M542 Cervicalgia: Secondary | ICD-10-CM | POA: Diagnosis not present

## 2019-05-10 ENCOUNTER — Other Ambulatory Visit: Payer: Medicare Other

## 2019-05-15 ENCOUNTER — Ambulatory Visit: Payer: Medicare Other | Admitting: Family Medicine

## 2019-05-19 DIAGNOSIS — G935 Compression of brain: Secondary | ICD-10-CM | POA: Diagnosis not present

## 2019-05-19 DIAGNOSIS — G894 Chronic pain syndrome: Secondary | ICD-10-CM | POA: Diagnosis not present
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
## 2019-05-23 ENCOUNTER — Telehealth (INDEPENDENT_AMBULATORY_CARE_PROVIDER_SITE_OTHER): Payer: Medicare Other | Admitting: Neurology

## 2019-05-23 ENCOUNTER — Other Ambulatory Visit: Payer: Self-pay

## 2019-05-23 ENCOUNTER — Other Ambulatory Visit: Payer: Self-pay | Admitting: Neurology

## 2019-05-23 ENCOUNTER — Encounter: Payer: Self-pay | Admitting: Neurology

## 2019-05-23 ENCOUNTER — Telehealth: Payer: Self-pay | Admitting: Neurology

## 2019-05-23 DIAGNOSIS — G935 Compression of brain: Secondary | ICD-10-CM

## 2019-05-23 DIAGNOSIS — M5416 Radiculopathy, lumbar region: Secondary | ICD-10-CM

## 2019-05-23 DIAGNOSIS — G43009 Migraine without aura, not intractable, without status migrainosus: Secondary | ICD-10-CM

## 2019-05-23 DIAGNOSIS — M542 Cervicalgia: Secondary | ICD-10-CM

## 2019-05-23 MED ORDER — GABAPENTIN 100 MG PO CAPS: 100.0000 mg | ORAL_CAPSULE | Freq: Three times a day (TID) | ORAL | 3 refills | Status: DC

## 2019-05-23 NOTE — Telephone Encounter (Signed)
Patient has a prescription for gabapentin 100mg  and takes 1 at bedtime.  She wants to know if she should continue taking it this way or increase.  Please advise.

## 2019-05-23 NOTE — Telephone Encounter (Signed)
Left message with the after hour service at 12:14 on 05-23-19   Caller states that she wants to speak to [facility]

## 2019-05-23 NOTE — Telephone Encounter (Signed)
I want her to take 100mg  three times daily.  I sent a new prescription to her pharmacy in [place] ([place])

## 2019-05-23 NOTE — Telephone Encounter (Signed)
Left message informing patient to take gabapentin tid.

## 2019-06-07 ENCOUNTER — Ambulatory Visit
Admission: RE | Admit: 2019-06-07 | Discharge: 2019-06-07 | Disposition: A | Discharge status: Home / Self Care | Payer: Medicare Other | Source: Ambulatory Visit | Attending: Family Medicine | Admitting: Family Medicine

## 2019-06-07 ENCOUNTER — Other Ambulatory Visit: Payer: Self-pay

## 2019-06-07 DIAGNOSIS — M542 Cervicalgia: Secondary | ICD-10-CM | POA: Diagnosis not present

## 2019-06-07 DIAGNOSIS — G4489 Other headache syndrome: Secondary | ICD-10-CM

## 2019-06-07 DIAGNOSIS — R51 Headache: Secondary | ICD-10-CM | POA: Diagnosis not present

## 2019-06-07 MED ORDER — GADOBENATE DIMEGLUMINE 529 MG/ML IV SOLN
15.0000 mL | Freq: Once | INTRAVENOUS | Status: AC | PRN
  Administered 2019-06-07: 15 mL via INTRAVENOUS

## 2019-06-08 ENCOUNTER — Encounter: Payer: Self-pay | Admitting: Family Medicine

## 2019-06-09 ENCOUNTER — Ambulatory Visit: Payer: Medicare Other | Admitting: Family Medicine

## 2019-06-16 ENCOUNTER — Encounter: Payer: Self-pay | Admitting: Family Medicine

## 2019-06-17 ENCOUNTER — Encounter: Payer: Self-pay | Admitting: Family Medicine

## 2019-06-17 ENCOUNTER — Ambulatory Visit: Payer: Medicare Other | Admitting: Family Medicine

## 2019-06-23 ENCOUNTER — Encounter: Payer: Self-pay | Admitting: Family Medicine

## 2019-06-25 DIAGNOSIS — Q0700 Arnold-Chiari syndrome without spina bifida or hydrocephalus: Secondary | ICD-10-CM | POA: Diagnosis not present

## 2019-06-25 DIAGNOSIS — G43B1 Ophthalmoplegic migraine, intractable: Secondary | ICD-10-CM | POA: Diagnosis not present

## 2019-06-26 DIAGNOSIS — Q0700 Arnold-Chiari syndrome without spina bifida or hydrocephalus: Secondary | ICD-10-CM | POA: Diagnosis not present

## 2019-06-26 DIAGNOSIS — G43B1 Ophthalmoplegic migraine, intractable: Secondary | ICD-10-CM | POA: Diagnosis not present

## 2019-07-01 DIAGNOSIS — M5440 Lumbago with sciatica, unspecified side: Secondary | ICD-10-CM | POA: Diagnosis not present

## 2019-07-01 DIAGNOSIS — G935 Compression of brain: Secondary | ICD-10-CM | POA: Diagnosis not present

## 2019-07-03 ENCOUNTER — Other Ambulatory Visit: Payer: Self-pay | Admitting: Neurosurgery

## 2019-07-03 DIAGNOSIS — M5440 Lumbago with sciatica, unspecified side: Secondary | ICD-10-CM

## 2019-07-14 ENCOUNTER — Ambulatory Visit: Payer: Medicare Other | Admitting: Adult Health

## 2019-07-23 ENCOUNTER — Encounter: Payer: Self-pay | Admitting: Family Medicine

## 2019-07-23 DIAGNOSIS — M545 Low back pain: Secondary | ICD-10-CM | POA: Diagnosis not present

## 2019-07-25 DIAGNOSIS — Z23 Encounter for immunization: Secondary | ICD-10-CM | POA: Diagnosis not present

## 2019-07-30 ENCOUNTER — Other Ambulatory Visit: Payer: Self-pay

## 2019-07-30 ENCOUNTER — Encounter: Payer: Self-pay | Admitting: Internal Medicine

## 2019-07-30 ENCOUNTER — Ambulatory Visit (INDEPENDENT_AMBULATORY_CARE_PROVIDER_SITE_OTHER): Payer: Medicare Other | Admitting: Internal Medicine

## 2019-07-30 DIAGNOSIS — G935 Compression of brain: Secondary | ICD-10-CM | POA: Diagnosis not present

## 2019-07-30 DIAGNOSIS — J4520 Mild intermittent asthma, uncomplicated: Secondary | ICD-10-CM

## 2019-07-30 DIAGNOSIS — M542 Cervicalgia: Secondary | ICD-10-CM | POA: Diagnosis not present

## 2019-07-30 NOTE — Progress Notes (Signed)
Virtual Visit via Video Note  I connected with Mary [name] on 07/30/19 at  2:00 PM EDT by a video enabled telemedicine application and verified that I am speaking with the correct person using two identifiers.  Location patient: home Location provider: work office Persons participating in the virtual visit: patient, provider  I discussed the limitations of evaluation and management by telemedicine and the availability of in person appointments. The patient expressed understanding and agreed to proceed.   HPI: She has scheduled this visit to establish care and discuss chronic conditions.  She was recently released from her prior PCPs office because of incompatibility.  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  She is a smoker since her early 6020s of 1/2 pack a day, drinks alcohol occasionally.  She has allergies to sulfa drugs which cause swelling, peanuts and codeine which causes an itch.  Her family history is significant for a mother with hypertension and breast cancer at age 33 and a maternal grandmother who passed with pancreatic cancer last year.  Her past medical history significant for:  1.  Asthma that is well-controlled on Singulair and as needed albuterol.  She also has allergic rhinitis and Flonase helps.  2.  Most significantly she has a Budd-Chiari malformation, was found to have 12 mm tonsillar herniation and underwent surgical repair in January of this year.  She is followed by multiple specialists including neurosurgery Dr. Wynetta [name], neurology Dr. Everlena [name], Sports medicine Dr. Katrinka [name].  She has been prescribed gabapentin but is not currently taking it.  She had some leftover Percocets from her surgery in January and this seems to help best, she is not requesting pain medication today.  She has an MRI of her spine scheduled for later this month but she has been having increasing neck pain as well as left-sided hand and  feet numbness.   ROS: Constitutional: Denies fever, chills, diaphoresis, appetite change and fatigue.  HEENT: Denies photophobia, eye pain, redness, hearing loss, ear pain, congestion, sore throat, rhinorrhea, sneezing, mouth sores, trouble swallowing, neck pain, neck stiffness and tinnitus.   Respiratory: Denies SOB, DOE, cough, chest tightness,  and wheezing.   Cardiovascular: Denies chest pain, palpitations and leg swelling.  Gastrointestinal: Denies nausea, vomiting, abdominal pain, diarrhea, constipation, blood in stool and abdominal distention.  Genitourinary: Denies dysuria, urgency, frequency, hematuria, flank pain and difficulty urinating.  Endocrine: Denies: hot or cold intolerance, sweats, changes in hair or nails, polyuria, polydipsia. Musculoskeletal: Denies myalgias, back pain, joint swelling, arthralgias and gait problem.  Skin: Denies pallor, rash and wound.  Neurological: Denies dizziness, seizures, syncope, weakness, light-headedness, numbness and headaches.  Hematological: Denies adenopathy. Easy bruising, personal or family bleeding history  Psychiatric/Behavioral: Denies suicidal ideation, mood changes, confusion, nervousness, sleep disturbance and agitation   Past Medical History:  Diagnosis Date   Asthma    Bronchitis 01/31/2016   Chiari malformation type I (HCC)    Depression    GERD (gastroesophageal reflux disease)    H/O multiple concussions    Headache(784.0)    History of palpitations    Hx of fracture of nose    Mental disorder    Multiple fractures    Hx: of a leg and arm fracture as a child   Pneumonia    PONV (postoperative nausea and vomiting)    Post traumatic stress disorder (PTSD)    Pregnant 01/21/2016   PTSD (post-traumatic stress disorder)  Sinus infection 01/31/2016   Supervision of normal pregnancy, antepartum 02/08/2016    [facility] at             13+1 week FOB   Mary [name] 33 yo WM Dating  By  LMP and [place] Pap  02/08/16 GC/CT Initial:                36+wks: Genetic Screen NT/IT:  CF screen  Anatomic [place]  Flu vaccine  Tdap Recommended ~ 28wks Glucose Screen  2 hr GBS  Feed Preference  Contraception  Circumcision  Childbirth Classes  Pediatrician      Past Surgical History:  Procedure Laterality Date   botox injections     colposcopy     DILATION AND CURETTAGE OF UTERUS     nexplanon     SUBOCCIPITAL CRANIECTOMY CERVICAL LAMINECTOMY N/A 12/16/2018   Procedure: Cervical one Laminectomy/Chiari decompression;  Surgeon: Mary Kos, MD;  Location: [facility];  Service: Neurosurgery;  Laterality: N/A;   WISDOM TOOTH EXTRACTION      Family History  Problem Relation Age of Onset   Cancer Mother 31       breast   Hypertension Mother    Depression Mother    Depression Father    Seizures Father    Cancer Maternal Grandmother        breast and cervical   Asthma Son    Asthma Son    Hypertension Other    Cancer Other     SOCIAL HX:   reports that she has been smoking cigarettes. She has been smoking about 0.50 packs per day. She has never used smokeless tobacco. She reports current alcohol use. She reports that she does not use drugs.   Current Outpatient Medications:    fluticasone (FLONASE) 50 MCG/ACT nasal spray, Place 2 sprays into both nostrils daily. , Disp: , Rfl:    gabapentin (NEURONTIN) 100 MG capsule, Take 1 capsule (100 mg total) by mouth 3 (three) times daily., Disp: 90 capsule, Rfl: 3   hydrOXYzine (ATARAX/VISTARIL) 10 MG tablet, Take 1 tablet (10 mg total) by mouth 3 (three) times daily as needed., Disp: 30 tablet, Rfl: 0   montelukast (SINGULAIR) 10 MG tablet, Take 1 tablet (10 mg total) by mouth at bedtime., Disp: 30 tablet, Rfl: 1   oxyCODONE-acetaminophen (PERCOCET/ROXICET) 5-325 MG tablet, Take 1 tablet by mouth every 4 (four) hours as needed for moderate pain., Disp: 30 tablet, Rfl: 0   VITAMIN D PO, Take 2,000 Int'l Units by mouth daily. ,  Disp: , Rfl:   EXAM:   VITALS per patient if applicable: None reported  GENERAL: alert, oriented, appears well and in no acute distress  HEENT: atraumatic, conjunttiva clear, no obvious abnormalities on inspection of external nose and ears  NECK: normal movements of the head and neck  LUNGS: on inspection no signs of respiratory distress, breathing rate appears normal, no obvious gross increased work of breathing, gasping or wheezing  CV: no obvious cyanosis  MS: moves all visible extremities without noticeable abnormality  PSYCH/NEURO: pleasant and cooperative, no obvious depression or anxiety, speech and thought processing grossly intact  ASSESSMENT AND PLAN:   Chiari I malformation ([place]) -Continue follow-up as scheduled with neurology and neurosurgery. -MRI scheduled for later this month.  Mild intermittent asthma without complication -Seems well controlled.  Neck pain -Pretty well controlled with over-the-counter pain medication, icing, does not want to take gabapentin for now.     I discussed the assessment  and treatment plan with the patient. The patient was provided an opportunity to ask questions and all were answered. The patient agreed with the plan and demonstrated an understanding of the instructions.   The patient was advised to call back or seek an in-person evaluation if the symptoms worsen or if the condition fails to improve as anticipated.    Chaya JanEstela Hernandez Acosta, MD  [facility]

## 2019-08-09 ENCOUNTER — Other Ambulatory Visit: Payer: Medicare Other

## 2019-08-26 ENCOUNTER — Telehealth: Payer: Self-pay | Admitting: *Deleted

## 2019-08-26 NOTE — Telephone Encounter (Signed)
Copied from CRM #[id number]. Topic: Appointment Scheduling - Scheduling Inquiry for Clinic >> Aug 25, 2019  1:58 PM Johnson, Chaz E wrote: Reason for CRM: Pt called to schedule a nurse visit for the Varicella vaccine for class and wanted to know if she can get it during her CPE appt on 10.1.20 or if it needs to be closer to the due date which is 10.17.20/ please advise 

## 2019-08-27 NOTE — Telephone Encounter (Signed)
Left message on machine for patient.  Will discuss varicella vaccine at CPE.

## 2019-08-27 NOTE — Telephone Encounter (Signed)
Copied from [id number]. Topic: Appointment Scheduling - Scheduling Inquiry for Clinic >> Aug 25, 2019  1:58 PM Alanda Slim E wrote: Reason for CRM: Pt called to schedule a nurse visit for the Varicella vaccine for class and wanted to know if she can get it during her CPE appt on 10.1.20 or if it needs to be closer to the due date which is 10.17.20/ please advise

## 2019-09-10 ENCOUNTER — Ambulatory Visit
Admission: RE | Admit: 2019-09-10 | Discharge: 2019-09-10 | Disposition: A | Discharge status: Home / Self Care | Payer: Medicare Other | Source: Ambulatory Visit | Attending: Neurosurgery | Admitting: Neurosurgery

## 2019-09-10 ENCOUNTER — Other Ambulatory Visit: Payer: Self-pay | Admitting: Neurosurgery

## 2019-09-10 DIAGNOSIS — M5440 Lumbago with sciatica, unspecified side: Secondary | ICD-10-CM

## 2019-09-11 ENCOUNTER — Other Ambulatory Visit: Payer: Self-pay

## 2019-09-11 ENCOUNTER — Other Ambulatory Visit: Payer: Self-pay | Admitting: Internal Medicine

## 2019-09-11 ENCOUNTER — Ambulatory Visit (INDEPENDENT_AMBULATORY_CARE_PROVIDER_SITE_OTHER): Payer: Medicare Other | Admitting: Internal Medicine

## 2019-09-11 ENCOUNTER — Encounter: Payer: Self-pay | Admitting: Internal Medicine

## 2019-09-11 VITALS — BP 110/70 | HR 86 | Temp 97.2°F | Ht 65.0 in | Wt 176.0 lb

## 2019-09-11 DIAGNOSIS — E785 Hyperlipidemia, unspecified: Secondary | ICD-10-CM | POA: Insufficient documentation

## 2019-09-11 DIAGNOSIS — E559 Vitamin D deficiency, unspecified: Secondary | ICD-10-CM

## 2019-09-11 DIAGNOSIS — Z803 Family history of malignant neoplasm of breast: Secondary | ICD-10-CM

## 2019-09-11 DIAGNOSIS — N870 Mild cervical dysplasia: Secondary | ICD-10-CM | POA: Diagnosis not present

## 2019-09-11 DIAGNOSIS — Z20820 Contact with and (suspected) exposure to varicella: Secondary | ICD-10-CM | POA: Diagnosis not present

## 2019-09-11 DIAGNOSIS — Z23 Encounter for immunization: Secondary | ICD-10-CM | POA: Diagnosis not present

## 2019-09-11 DIAGNOSIS — G935 Compression of brain: Secondary | ICD-10-CM

## 2019-09-11 DIAGNOSIS — Z0000 Encounter for general adult medical examination without abnormal findings: Secondary | ICD-10-CM | POA: Diagnosis not present

## 2019-09-11 LAB — COMPREHENSIVE METABOLIC PANEL
ALT: 18 U/L (ref 0–35)
AST: 12 U/L (ref 0–37)
Albumin: 4.2 g/dL (ref 3.5–5.2)
Alkaline Phosphatase: 67 U/L (ref 39–117)
BUN: 11 mg/dL (ref 6–23)
CO2: 26 mEq/L (ref 19–32)
Calcium: 9.4 mg/dL (ref 8.4–10.5)
Chloride: 105 mEq/L (ref 96–112)
Creatinine, Ser: 0.82 mg/dL (ref 0.40–1.20)
GFR: 80.36 mL/min (ref >60.00)
Glucose, Bld: 83 mg/dL (ref 70–99)
Potassium: 4 mEq/L (ref 3.5–5.1)
Sodium: 139 mEq/L (ref 135–145)
Total Bilirubin: 0.5 mg/dL (ref 0.2–1.2)
Total Protein: 6.9 g/dL (ref 6.0–8.3)

## 2019-09-11 LAB — CBC WITH DIFFERENTIAL/PLATELET
Basophils Absolute: 0 10*3/uL (ref 0.0–0.1)
Basophils Relative: 0.6 % (ref 0.0–3.0)
Eosinophils Absolute: 0.3 10*3/uL (ref 0.0–0.7)
Eosinophils Relative: 4.1 % (ref 0.0–5.0)
HCT: 39.7 % (ref 36.0–46.0)
Hemoglobin: 12.9 g/dL (ref 12.0–15.0)
Lymphocytes Relative: 27.7 % (ref 12.0–46.0)
Lymphs Abs: 2.2 10*3/uL (ref 0.7–4.0)
MCHC: 32.5 g/dL (ref 30.0–36.0)
MCV: 94.4 fl (ref 78.0–100.0)
Monocytes Absolute: 0.4 10*3/uL (ref 0.1–1.0)
Monocytes Relative: 5.6 % (ref 3.0–12.0)
Neutro Abs: 4.9 10*3/uL (ref 1.4–7.7)
Neutrophils Relative %: 62 % (ref 43.0–77.0)
Platelets: 265 10*3/uL (ref 150.0–400.0)
RBC: 4.2 Mil/uL (ref 3.87–5.11)
RDW: 13.9 % (ref 11.5–15.5)
WBC: 7.9 10*3/uL (ref 4.0–10.5)

## 2019-09-11 LAB — LIPID PANEL
Cholesterol: 192 mg/dL (ref 0–200)
HDL: 32.1 mg/dL — ABNORMAL LOW (ref >39.00)
LDL Cholesterol: 135 mg/dL — ABNORMAL HIGH (ref 0–99)
NonHDL: 160.01
Total CHOL/HDL Ratio: 6
Triglycerides: 125 mg/dL (ref 0.0–149.0)
VLDL: 25 mg/dL (ref 0.0–40.0)

## 2019-09-11 LAB — VITAMIN B12: Vitamin B-12: 326 pg/mL (ref 211–911)

## 2019-09-11 LAB — VITAMIN D 25 HYDROXY (VIT D DEFICIENCY, FRACTURES): VITD: 22.55 ng/mL — ABNORMAL LOW (ref 30.00–100.00)

## 2019-09-11 LAB — HEMOGLOBIN A1C: Hgb A1c MFr Bld: 5.6 % (ref 4.6–6.5)

## 2019-09-11 LAB — TSH: TSH: 1.71 u[IU]/mL (ref 0.35–4.50)

## 2019-09-11 MED ORDER — VITAMIN D (ERGOCALCIFEROL) 1.25 MG (50000 UNIT) PO CAPS: 50000.0000 [IU] | ORAL_CAPSULE | ORAL | 0 refills | Status: AC

## 2019-09-11 NOTE — Addendum Note (Signed)
Addended by: Suzette Battiest on: 09/11/2019 10:44 AM   Modules accepted: Orders

## 2019-09-11 NOTE — Patient Instructions (Addendum)
-Nice seeing you today!!  -Lab work today; will notify you once results are available.  -Varicella vaccine today.  -Schedule follow up in 1 year or sooner as needed.   Preventive Care 28-33 Years Old, Female Preventive care refers to visits with your health care provider and lifestyle choices that can promote health and wellness. This includes:  A yearly physical exam. This may also be called an annual well check.  Regular dental visits and eye exams.  Immunizations.  Screening for certain conditions.  Healthy lifestyle choices, such as eating a healthy diet, getting regular exercise, not using drugs or products that contain nicotine and tobacco, and limiting alcohol use. What can I expect for my preventive care visit? Physical exam Your health care provider will check your:  Height and weight. This may be used to calculate body mass index (BMI), which tells if you are at a healthy weight.  Heart rate and blood pressure.  Skin for abnormal spots. Counseling Your health care provider may ask you questions about your:  Alcohol, tobacco, and drug use.  Emotional well-being.  Home and relationship well-being.  Sexual activity.  Eating habits.  Work and work Statistician.  Method of birth control.  Menstrual cycle.  Pregnancy history. What immunizations do I need?  Influenza (flu) vaccine  This is recommended every year. Tetanus, diphtheria, and pertussis (Tdap) vaccine  You may need a Td booster every 10 years. Varicella (chickenpox) vaccine  You may need this if you have not been vaccinated. Human papillomavirus (HPV) vaccine  If recommended by your health care provider, you may need three doses over 6 months. Measles, mumps, and rubella (MMR) vaccine  You may need at least one dose of MMR. You may also need a second dose. Meningococcal conjugate (MenACWY) vaccine  One dose is recommended if you are age 90-21 years and a first-year college student  living in a residence hall, or if you have one of several medical conditions. You may also need additional booster doses. Pneumococcal conjugate (PCV13) vaccine  You may need this if you have certain conditions and were not previously vaccinated. Pneumococcal polysaccharide (PPSV23) vaccine  You may need one or two doses if you smoke cigarettes or if you have certain conditions. Hepatitis A vaccine  You may need this if you have certain conditions or if you travel or work in places where you may be exposed to hepatitis A. Hepatitis B vaccine  You may need this if you have certain conditions or if you travel or work in places where you may be exposed to hepatitis B. Haemophilus influenzae type b (Hib) vaccine  You may need this if you have certain conditions. You may receive vaccines as individual doses or as more than one vaccine together in one shot (combination vaccines). Talk with your health care provider about the risks and benefits of combination vaccines. What tests do I need?  Blood tests  Lipid and cholesterol levels. These may be checked every 5 years starting at age 77.  Hepatitis C test.  Hepatitis B test. Screening  Diabetes screening. This is done by checking your blood sugar (glucose) after you have not eaten for a while (fasting).  Sexually transmitted disease (STD) testing.  BRCA-related cancer screening. This may be done if you have a family history of breast, ovarian, tubal, or peritoneal cancers.  Pelvic exam and Pap test. This may be done every 3 years starting at age 44. Starting at age 85, this may be done every 5 years  if you have a Pap test in combination with an HPV test. Talk with your health care provider about your test results, treatment options, and if necessary, the need for more tests. Follow these instructions at home: Eating and drinking   Eat a diet that includes fresh fruits and vegetables, whole grains, lean protein, and low-fat  dairy.  Take vitamin and mineral supplements as recommended by your health care provider.  Do not drink alcohol if: ? Your health care provider tells you not to drink. ? You are pregnant, may be pregnant, or are planning to become pregnant.  If you drink alcohol: ? Limit how much you have to 0-1 drink a day. ? Be aware of how much alcohol is in your drink. In the [place] one drink equals one 12 oz bottle of beer (355 mL), one 5 oz glass of wine (148 mL), or one 1 oz glass of hard liquor (44 mL). Lifestyle  Take daily care of your teeth and gums.  Stay active. Exercise for at least 30 minutes on 5 or more days each week.  Do not use any products that contain nicotine or tobacco, such as cigarettes, e-cigarettes, and chewing tobacco. If you need help quitting, ask your health care provider.  If you are sexually active, practice safe sex. Use a condom or other form of birth control (contraception) in order to prevent pregnancy and STIs (sexually transmitted infections). If you plan to become pregnant, see your health care provider for a preconception visit. What's next?  Visit your health care provider once a year for a well check visit.  Ask your health care provider how often you should have your eyes and teeth checked.  Stay up to date on all vaccines. This information is not intended to replace advice given to you by your health care provider. Make sure you discuss any questions you have with your health care provider. Document Released: 01/23/2002 Document Revised: 08/08/2018 Document Reviewed: 08/08/2018 Elsevier Patient Education  2020 [employer].

## 2019-09-11 NOTE — Progress Notes (Signed)
Established Patient Office Visit     CC/Reason for Visit: Annual preventive exam  HPI: Mary [name] is a 33 y.o. female who is coming in today for the above mentioned reasons. Past Medical History is significant for: Ongoing tobacco abuse, well-controlled asthma, Budd-Chiari malformation followed by Dr. Saintclair Halsted with follow-up MRI coming up soon.  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  Her mom had a history of breast cancer at age 65.  She had an allergic reaction to flu vaccine earlier this year.  She has routine eye and dental care.  She has a history of abnormal Pap smears but follows with GYN, she is due for Pap test.   Past Medical/Surgical History: Past Medical History:  Diagnosis Date  . Asthma   . Bronchitis 01/31/2016  . Chiari malformation type I ([place])   . Depression   . GERD (gastroesophageal reflux disease)   . H/O multiple concussions   . Headache(784.0)   . History of palpitations   . Hx of fracture of nose   . Mental disorder   . Multiple fractures    Hx: of a leg and arm fracture as a child  . Pneumonia   . PONV (postoperative nausea and vomiting)   . Post traumatic stress disorder (PTSD)   . Pregnant 01/21/2016  . PTSD (post-traumatic stress disorder)   . Sinus infection 01/31/2016  . Supervision of normal pregnancy, antepartum 02/08/2016    [facility]             13+1 week FOB   Sharah Finnell 33 yo WM Dating By  LMP and [place] Pap  02/08/16 GC/CT Initial:                36+wks: Genetic Screen NT/IT:  CF screen  Anatomic [place]  Flu vaccine  Tdap Recommended ~ 28wks Glucose Screen  2 hr GBS  Feed Preference  Contraception  Circumcision  Childbirth Classes  Pediatrician      Past Surgical History:  Procedure Laterality Date  . botox injections    . colposcopy    . DILATION AND CURETTAGE OF UTERUS    . nexplanon    . SUBOCCIPITAL CRANIECTOMY CERVICAL LAMINECTOMY N/A 12/16/2018   Procedure: Cervical one Laminectomy/Chiari decompression;  Surgeon: Kary Kos, MD;  Location: [facility];  Service: Neurosurgery;  Laterality: N/A;  . WISDOM TOOTH EXTRACTION      Social History:  reports that she has been smoking cigarettes. She has been smoking about 0.50 packs per day. She has never used smokeless tobacco. She reports current alcohol use. She reports that she does not use drugs.  Allergies: Allergies  Allergen Reactions  . Codeine Shortness Of Breath and Other (See Comments)    Can not take in liquid form but does not recall exactly what formulation she had Has taken vicodin and percocet without problem per pt  . Peanut-Containing Drug Products Anaphylaxis  . Sulfa Antibiotics Anaphylaxis and Itching  . Robaxin [Methocarbamol] Other (See Comments)    Migraine    Family History:  Family History  Problem Relation Age of Onset  . Cancer Mother 75       breast  . Hypertension Mother   . Depression Mother   . Depression Father   . Seizures Father   . Cancer Maternal Grandmother        breast and cervical  . Asthma Son   . Asthma Son   .  Hypertension Other   . Cancer Other      Current Outpatient Medications:  .  fluticasone (FLONASE) 50 MCG/ACT nasal spray, Place 2 sprays into both nostrils daily. , Disp: , Rfl:  .  montelukast (SINGULAIR) 10 MG tablet, Take 1 tablet (10 mg total) by mouth at bedtime., Disp: 30 tablet, Rfl: 1 .  VITAMIN D PO, Take 2,000 Int'l Units by mouth daily. , Disp: , Rfl:   Review of Systems:  Constitutional: Denies fever, chills, diaphoresis, appetite change and fatigue.  HEENT: Denies photophobia, eye pain, redness, hearing loss, ear pain, congestion, sore throat, rhinorrhea, sneezing, mouth sores, trouble swallowing, neck pain, neck stiffness and tinnitus.   Respiratory: Denies SOB, DOE, cough, chest tightness,  and wheezing.   Cardiovascular: Denies chest pain, palpitations and leg swelling.  Gastrointestinal: Denies nausea,  vomiting, abdominal pain, diarrhea, constipation, blood in stool and abdominal distention.  Genitourinary: Denies dysuria, urgency, frequency, hematuria, flank pain and difficulty urinating.  Endocrine: Denies: hot or cold intolerance, sweats, changes in hair or nails, polyuria, polydipsia. Musculoskeletal: Denies myalgias, back pain, joint swelling, arthralgias and gait problem.  Skin: Denies pallor, rash and wound.  Neurological: Denies dizziness, seizures, syncope, weakness, light-headedness, numbness and headaches.  Hematological: Denies adenopathy. Easy bruising, personal or family bleeding history  Psychiatric/Behavioral: Denies suicidal ideation, mood changes, confusion, nervousness, sleep disturbance and agitation    Physical Exam: Vitals:   09/11/19 0946  BP: 110/70  Pulse: 86  Temp: (!) 97.2 F (36.2 C)  TempSrc: Temporal  SpO2: 97%  Weight: 176 lb (79.8 kg)  Height: '5\' 5"'$  (1.651 m)    Body mass index is 29.29 kg/m.   Constitutional: NAD, calm, comfortable Eyes: PERRL, lids and conjunctivae normal ENMT: Mucous membranes are moist.  Tympanic membrane is pearly white, no erythema or bulging. Neck: normal, supple, no masses, no thyromegaly Respiratory: clear to auscultation bilaterally, no wheezing, no crackles. Normal respiratory effort. No accessory muscle use.  Cardiovascular: Regular rate and rhythm, no murmurs / rubs / gallops. No extremity edema. 2+ pedal pulses. No carotid bruits.  Abdomen: no tenderness, no masses palpated. No hepatosplenomegaly. Bowel sounds positive.  Musculoskeletal: no clubbing / cyanosis. No joint deformity upper and lower extremities. Good ROM, no contractures. Normal muscle tone.  Skin: no rashes, lesions, ulcers. No induration Neurologic: CN 2-12 grossly intact. Sensation intact, DTR normal. Strength 5/5 in all 4.  Psychiatric: Normal judgment and insight. Alert and oriented x 3. Normal mood.    Impression and Plan:  Encounter for  preventive health examination  -She has routine eye and dental care. -Varicella vaccine today, otherwise immunizations are up-to-date and age-appropriate. -Screening labs today. -Healthy lifestyle has been discussed in detail. -Her mother had breast cancer at age 57, will send for early mammogram. -Commence routine colon cancer screening age 59. -She has a history of abnormal Pap smears and is due for repeat Pap that she gets with her GYN.  CIN I (cervical intraepithelial neoplasia I) -Needs follow-up with GYN.  Chiari I malformation ([place]) -Has MRI coming up soon for follow-up, she sees Dr. Saintclair Halsted.    Patient Instructions  -Nice seeing you today!!  -Lab work today; will notify you once results are available.  -Varicella vaccine today.  -Schedule follow up in 1 year or sooner as needed.   Preventive Care 38-33 Years Old, Female Preventive care refers to visits with your health care provider and lifestyle choices that can promote health and wellness. This includes:  A yearly physical exam. This may  also be called an annual well check.  Regular dental visits and eye exams.  Immunizations.  Screening for certain conditions.  Healthy lifestyle choices, such as eating a healthy diet, getting regular exercise, not using drugs or products that contain nicotine and tobacco, and limiting alcohol use. What can I expect for my preventive care visit? Physical exam Your health care provider will check your:  Height and weight. This may be used to calculate body mass index (BMI), which tells if you are at a healthy weight.  Heart rate and blood pressure.  Skin for abnormal spots. Counseling Your health care provider may ask you questions about your:  Alcohol, tobacco, and drug use.  Emotional well-being.  Home and relationship well-being.  Sexual activity.  Eating habits.  Work and work Statistician.  Method of birth control.  Menstrual cycle.  Pregnancy history.  What immunizations do I need?  Influenza (flu) vaccine  This is recommended every year. Tetanus, diphtheria, and pertussis (Tdap) vaccine  You may need a Td booster every 10 years. Varicella (chickenpox) vaccine  You may need this if you have not been vaccinated. Human papillomavirus (HPV) vaccine  If recommended by your health care provider, you may need three doses over 6 months. Measles, mumps, and rubella (MMR) vaccine  You may need at least one dose of MMR. You may also need a second dose. Meningococcal conjugate (MenACWY) vaccine  One dose is recommended if you are age 68-21 years and a first-year college student living in a residence hall, or if you have one of several medical conditions. You may also need additional booster doses. Pneumococcal conjugate (PCV13) vaccine  You may need this if you have certain conditions and were not previously vaccinated. Pneumococcal polysaccharide (PPSV23) vaccine  You may need one or two doses if you smoke cigarettes or if you have certain conditions. Hepatitis A vaccine  You may need this if you have certain conditions or if you travel or work in places where you may be exposed to hepatitis A. Hepatitis B vaccine  You may need this if you have certain conditions or if you travel or work in places where you may be exposed to hepatitis B. Haemophilus influenzae type b (Hib) vaccine  You may need this if you have certain conditions. You may receive vaccines as individual doses or as more than one vaccine together in one shot (combination vaccines). Talk with your health care provider about the risks and benefits of combination vaccines. What tests do I need?  Blood tests  Lipid and cholesterol levels. These may be checked every 5 years starting at age 2.  Hepatitis C test.  Hepatitis B test. Screening  Diabetes screening. This is done by checking your blood sugar (glucose) after you have not eaten for a while (fasting).   Sexually transmitted disease (STD) testing.  BRCA-related cancer screening. This may be done if you have a family history of breast, ovarian, tubal, or peritoneal cancers.  Pelvic exam and Pap test. This may be done every 3 years starting at age 32. Starting at age 18, this may be done every 5 years if you have a Pap test in combination with an HPV test. Talk with your health care provider about your test results, treatment options, and if necessary, the need for more tests. Follow these instructions at home: Eating and drinking   Eat a diet that includes fresh fruits and vegetables, whole grains, lean protein, and low-fat dairy.  Take vitamin and mineral supplements as  recommended by your health care provider.  Do not drink alcohol if: ? Your health care provider tells you not to drink. ? You are pregnant, may be pregnant, or are planning to become pregnant.  If you drink alcohol: ? Limit how much you have to 0-1 drink a day. ? Be aware of how much alcohol is in your drink. In the [place] one drink equals one 12 oz bottle of beer (355 mL), one 5 oz glass of wine (148 mL), or one 1 oz glass of hard liquor (44 mL). Lifestyle  Take daily care of your teeth and gums.  Stay active. Exercise for at least 30 minutes on 5 or more days each week.  Do not use any products that contain nicotine or tobacco, such as cigarettes, e-cigarettes, and chewing tobacco. If you need help quitting, ask your health care provider.  If you are sexually active, practice safe sex. Use a condom or other form of birth control (contraception) in order to prevent pregnancy and STIs (sexually transmitted infections). If you plan to become pregnant, see your health care provider for a preconception visit. What's next?  Visit your health care provider once a year for a well check visit.  Ask your health care provider how often you should have your eyes and teeth checked.  Stay up to date on all vaccines. This  information is not intended to replace advice given to you by your health care provider. Make sure you discuss any questions you have with your health care provider. Document Released: 01/23/2002 Document Revised: 08/08/2018 Document Reviewed: 08/08/2018 Elsevier Patient Education  2020 [place], MD [facility]

## 2019-09-11 NOTE — Addendum Note (Signed)
Addended by: Westley Hummer B on: 09/11/2019 10:37 AM   Modules accepted: Orders

## 2019-09-16 IMAGING — MR MRI CERVICAL SPINE WITHOUT CONTRAST
5 series · 29 of 48 positions shown · non-contrast
Comparison: 03/03/2018

CLINICAL DATA: Cervicalgia.

EXAM:
MRI CERVICAL SPINE WITHOUT CONTRAST
TECHNIQUE: Multiplanar, multisequence MR imaging of the cervical spine was
performed. No intravenous contrast was administered.

[Series 2: T2 · sagittal · 3.0mm · 0.66mm/px · 6 of 13 slices shown (1 of 2)]
[im 1/13]
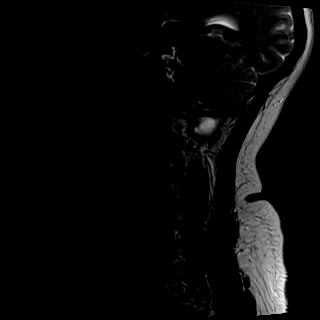
[im 3/13]
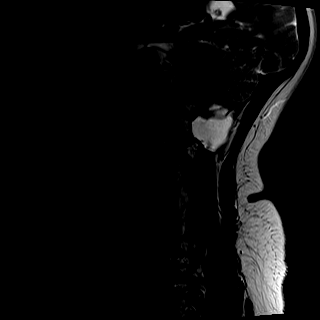
[im 5/13]
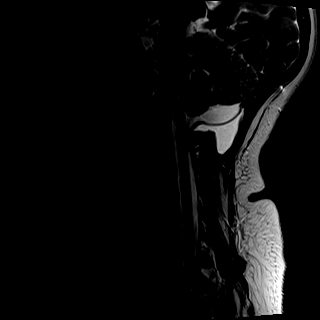
[im 8/13]
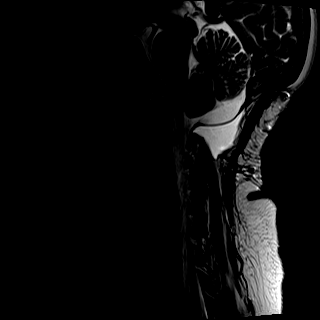
[im 10/13]
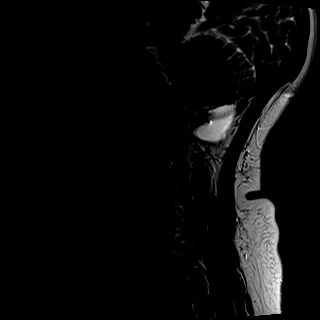
[im 13/13]
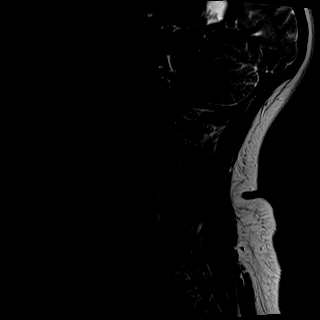

[Series 3: STIR · sagittal · 3.0mm · 0.41mm/px · 6 of 13 slices shown]
[im 1/13]
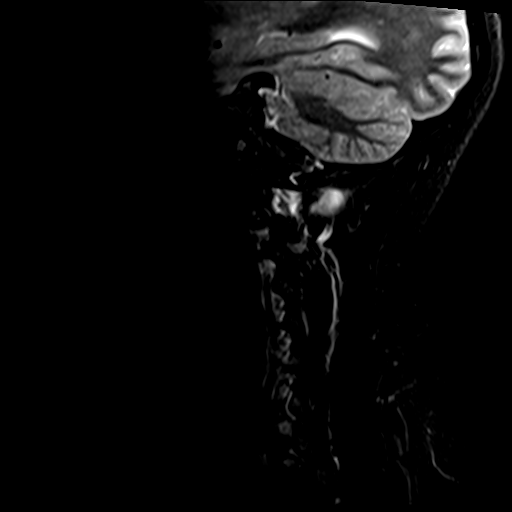
[im 3/13]
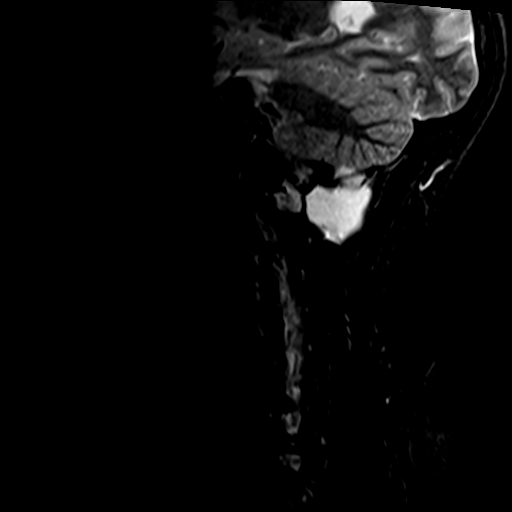
[im 5/13]
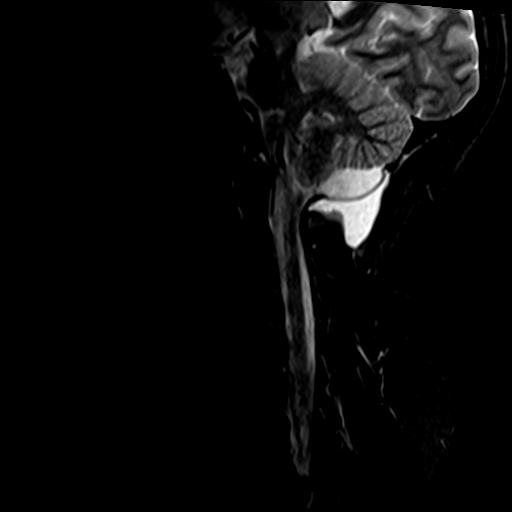
[im 8/13]
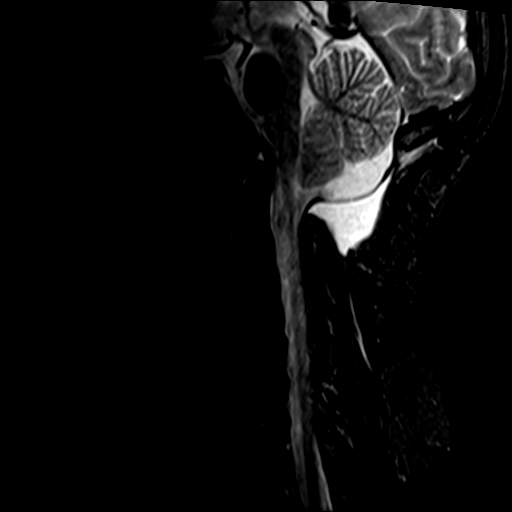
[im 10/13]
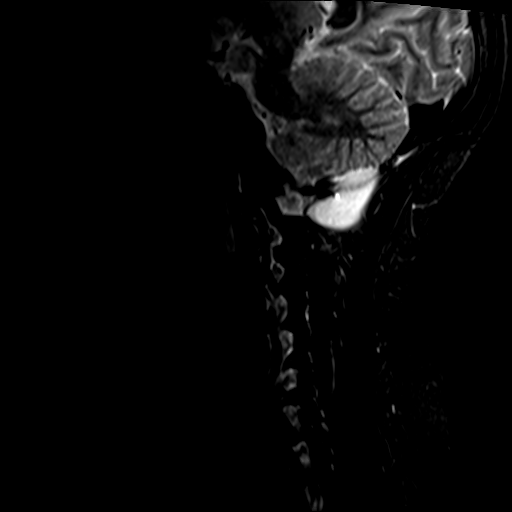
[im 13/13]
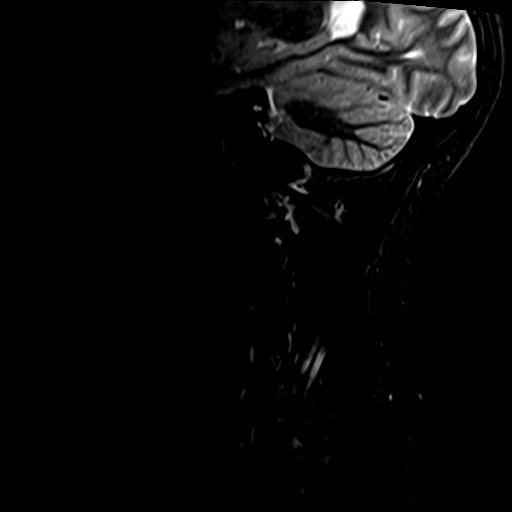

[Series 4: T1 · sagittal · 3.0mm · 0.41mm/px · 6 of 13 slices shown]
[im 1/13]
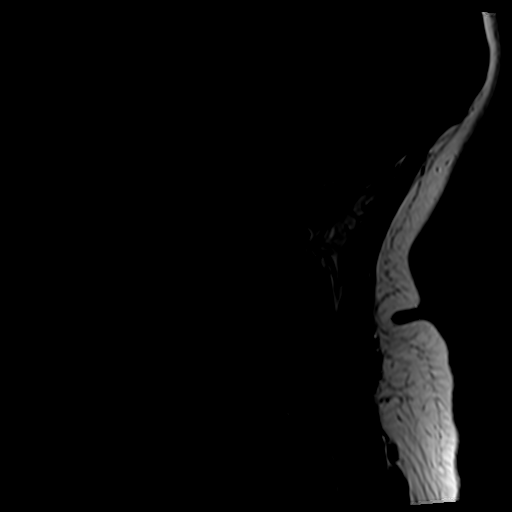
[im 3/13]
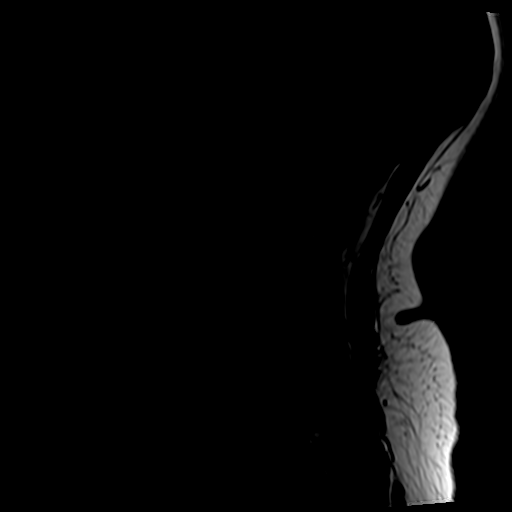
[im 5/13]
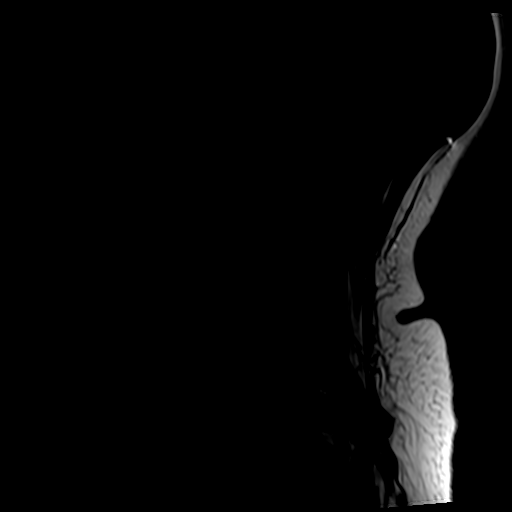
[im 8/13]
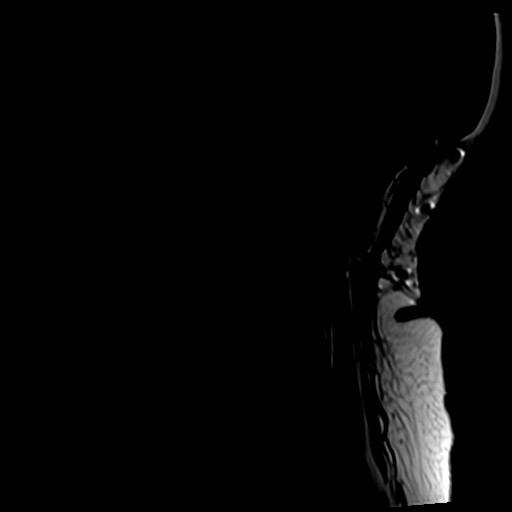
[im 10/13]
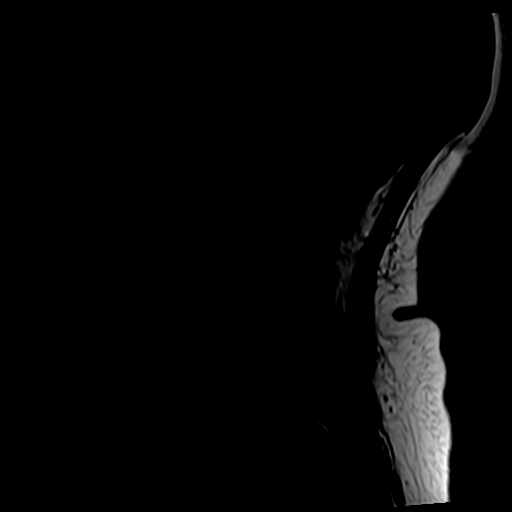
[im 13/13]
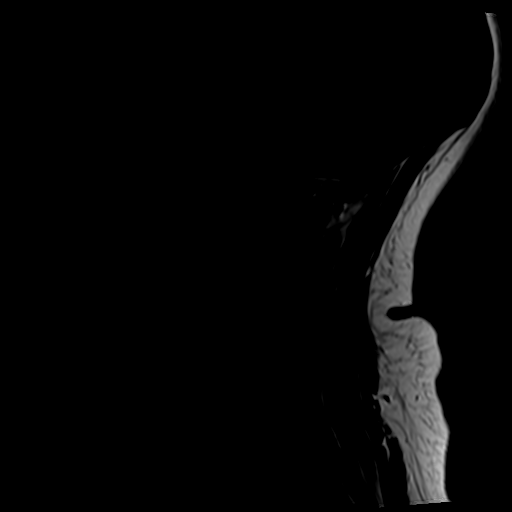

[Series 5: GRE · axial · 3.0mm · 0.35mm/px · z∈[-42,-28]mm · 2 of 31 slices shown]
[im 1/31]
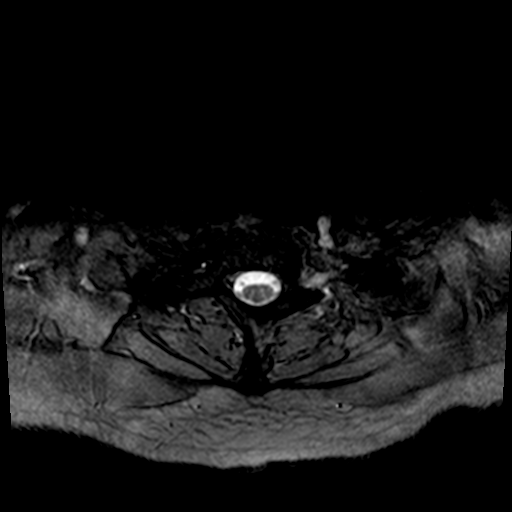
[im 5/31]
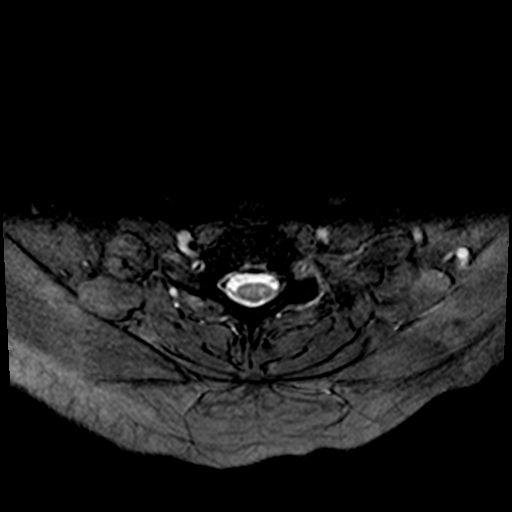

[Series 6: T2 · axial · 3.0mm · 0.70mm/px · z∈[-42,+67]mm · 9 of 31 slices shown (2 of 2)]
[im 1/31]
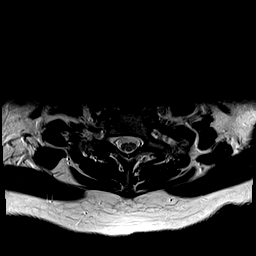
[im 5/31]
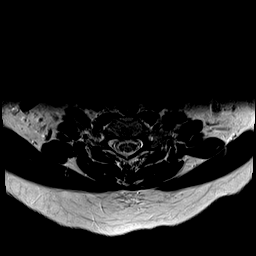
[im 9/31]
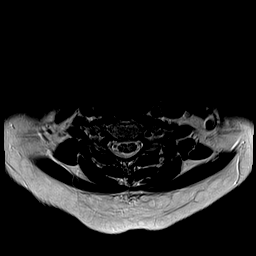
[im 13/31]
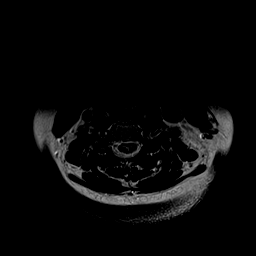
[im 16/31]
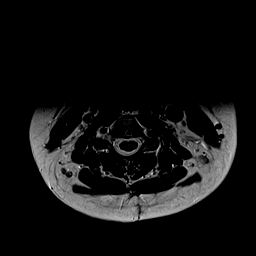
[im 18/31]
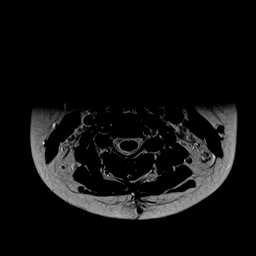
[im 22/31]
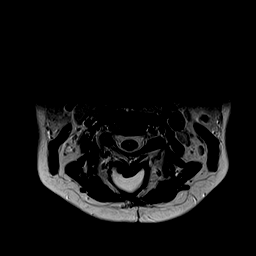
[im 26/31]
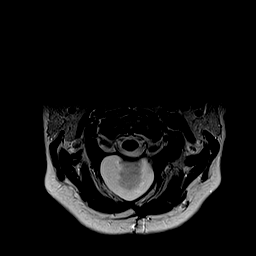
[im 31/31]
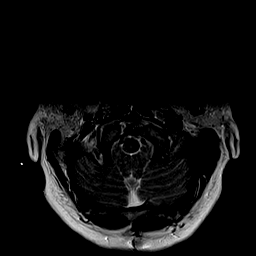

[29 of 48 positions shown; findings below may reference images not displayed]

FINDINGS: Alignment: Straightening of the cervical spine

Vertebrae: Suboccipital craniectomy and C1 laminectomy. No fracture,
discitis, or aggressive bone lesion.

Cord: Normal signal and morphology. No intraspinal collection
including syrinx.

Posterior Fossa, vertebral arteries, paraspinal tissues: Resolved
cerebellar mass effect. There is fluid external to the suboccipital
membrane which appears contained. No brain sagging.

Disc levels:

Well preserved disc height and hydration. No facet spurring or
impingement
IMPRESSION: Suboccipital craniectomy and C1 laminectomy with resolved foramen
magnum stenosis. There is encapsulated fluid beneath the occipital
membrane with no brain sagging or associated mass effect.

## 2019-09-24 ENCOUNTER — Other Ambulatory Visit: Payer: Self-pay

## 2019-09-24 ENCOUNTER — Emergency Department (HOSPITAL_COMMUNITY): Payer: Medicare Other

## 2019-09-24 ENCOUNTER — Other Ambulatory Visit: Payer: Self-pay | Admitting: Internal Medicine

## 2019-09-24 ENCOUNTER — Emergency Department (HOSPITAL_COMMUNITY)
Admission: EM | Admit: 2019-09-24 | Discharge: 2019-09-24 | Disposition: A | Discharge status: Home / Self Care | Payer: Medicare Other | Attending: Emergency Medicine | Admitting: Emergency Medicine

## 2019-09-24 ENCOUNTER — Encounter (HOSPITAL_COMMUNITY): Payer: Self-pay | Admitting: *Deleted

## 2019-09-24 ENCOUNTER — Ambulatory Visit: Payer: Self-pay | Admitting: *Deleted

## 2019-09-24 DIAGNOSIS — R918 Other nonspecific abnormal finding of lung field: Secondary | ICD-10-CM | POA: Diagnosis not present

## 2019-09-24 DIAGNOSIS — J45909 Unspecified asthma, uncomplicated: Secondary | ICD-10-CM | POA: Diagnosis not present

## 2019-09-24 DIAGNOSIS — R9389 Abnormal findings on diagnostic imaging of other specified body structures: Secondary | ICD-10-CM | POA: Insufficient documentation

## 2019-09-24 DIAGNOSIS — R21 Rash and other nonspecific skin eruption: Secondary | ICD-10-CM | POA: Diagnosis not present

## 2019-09-24 DIAGNOSIS — R0789 Other chest pain: Secondary | ICD-10-CM | POA: Diagnosis not present

## 2019-09-24 DIAGNOSIS — Z79899 Other long term (current) drug therapy: Secondary | ICD-10-CM | POA: Diagnosis not present

## 2019-09-24 DIAGNOSIS — E559 Vitamin D deficiency, unspecified: Secondary | ICD-10-CM

## 2019-09-24 DIAGNOSIS — F17210 Nicotine dependence, cigarettes, uncomplicated: Secondary | ICD-10-CM | POA: Insufficient documentation

## 2019-09-24 DIAGNOSIS — R079 Chest pain, unspecified: Secondary | ICD-10-CM | POA: Diagnosis not present

## 2019-09-24 MED ORDER — CEPHALEXIN 500 MG PO CAPS
500.0000 mg | ORAL_CAPSULE | Freq: Once | ORAL | Status: AC
  Administered 2019-09-24: 500 mg via ORAL
  Filled 2019-09-24: qty 1

## 2019-09-24 MED ORDER — CEPHALEXIN 500 MG PO CAPS: 500.0000 mg | ORAL_CAPSULE | Freq: Three times a day (TID) | ORAL | 0 refills | Status: AC

## 2019-09-24 NOTE — Telephone Encounter (Signed)
Patient is calling to report she has had chest pain since Friday- it was worse Friday and Saturday- but is still present today- she reports she also had varicella vaccine in R arm and the injection site is red, painful and she rash blistery rash present- vaccine was 2 weeks ago and she is not sure how long the rash has been present- it is of the back of arm. Due to consistent chest pain and vaccine reaction- per protocol advised ED for evaluation.  Reason for Disposition . [1] Redness or red streak around the injection site AND [2] begins > 48 hours after shot AND [3] no fever  (Exception: red area < 1 inch or 2.5 cm wide) . Chickenpox (varicella) vaccine reactions . [1] Chest pain lasts > 5 minutes AND [2] occurred in past 3 days (72 hours)    Chest pain was severe- now mild- but still present. Last constantly since Friday- advised ED.  Answer Assessment - Initial Assessment Questions 1. LOCATION: "Where does it hurt?"       Over heart 2. RADIATION: "Does the pain go anywhere else?" (e.g., into neck, jaw, arms, back)     No radiation 3. ONSET: "When did the chest pain begin?" (Minutes, hours or days)      Friday- started - still present but not as bad 4. PATTERN "Does the pain come and go, or has it been constant since it started?"  "Does it get worse with exertion?"      Constant- very slight 5. DURATION: "How long does it last" (e.g., seconds, minutes, hours)     Severe pain - Friday- Monday - thought it was anxiety 6. SEVERITY: "How bad is the pain?"  (e.g., Scale 1-10; mild, moderate, or severe)    - MILD (1-3): doesn't interfere with normal activities     - MODERATE (4-7): interferes with normal activities or awakens from sleep    - SEVERE (8-10): excruciating pain, unable to do any normal activities       Mild- 2 7. CARDIAC RISK FACTORS: "Do you have any history of heart problems or risk factors for heart disease?" (e.g., angina, prior heart attack; diabetes, high blood pressure, high  cholesterol, smoker, or strong family history of heart disease)     Chiari malformation does have heart problems associated with it 8. PULMONARY RISK FACTORS: "Do you have any history of lung disease?"  (e.g., blood clots in lung, asthma, emphysema, birth control pills)     asthma 9. CAUSE: "What do you think is causing the chest pain?"     Anxiety- has been long time since patient has had attack 10. OTHER SYMPTOMS: "Do you have any other symptoms?" (e.g., dizziness, nausea, vomiting, sweating, fever, difficulty breathing, cough)       Dizziness, nausea- all the time 11. PREGNANCY: "Is there any chance you are pregnant?" "When was your last menstrual period?"       No- LMP- 10/3-ended  Answer Assessment - Initial Assessment Questions 1. APPEARANCE of RASH: "Describe the rash."      Red painful hot- possible blisters 2. LOCATION: "Where is the rash located?"      R arm at injection site- varecella 3. NUMBER: "How many spots are there?"      Half dollar size area 4. SIZE: "How big are the spots?" (Inches, centimeters or compare to size of a coin)      Half dollar size area 5. ONSET: "When did the rash start?"      Not  sure 6. ITCHING: "Does the rash itch?" If so, ask: "How bad is the itch?"  (Scale 1-10; or mild, moderate, severe)     Not asked 7. PAIN: "Does the rash hurt?" If so, ask: "How bad is the pain?"  (Scale 1-10; or mild, moderate, severe)     painful 8. OTHER SYMPTOMS: "Do you have any other symptoms?" (e.g., fever)     Chest pain 9. PREGNANCY: "Is there any chance you are pregnant?" "When was your last menstrual period?"     no  Protocols used: IMMUNIZATION REACTIONS-A-AH, CHEST PAIN-A-AH, RASH OR REDNESS - LOCALIZED-A-AH

## 2019-09-24 NOTE — ED Notes (Signed)
Red area to back of right arm. Warm to touch

## 2019-09-24 NOTE — ED Provider Notes (Signed)
[facility] EMERGENCY DEPARTMENT Provider Note   CSN: [id number] Arrival date & time: 09/24/19  1027     History   Chief Complaint Chief Complaint  Patient presents with  . Chest Pain  . Rash    HPI Panagiota Perfetti is a 33 y.o. female.     HPI  This patient is a 33 year old female, she has a known history of asthma bronchitis, she has PTSD and presents to the hospital today, with a complaint of left-sided chest pain as well as a complaint of a right arm rash.  She reports that she had a "varicella vaccination" on October 1.  She had no significant signs or symptoms until the last 24 hours when she developed a rash at that same area which is warm, tender but not associated with a systemic fever.  She called her family doctor today because of some left-sided chest pain which is been going on for several days, constant, does not seem to be related to exertion, is definitely not associated with any shortness of breath or coughing.  It is sometimes sharp, sometimes dull, always located on the left side, sometimes she is pain-free.  She has no prior history of cardiac disease.  She was recommended to come to the hospital by her family doctor for an evaluation of the chest pain as well.  She has a history of PTSD and she feels like when she practices and focuses on her breathing exercises the pain gets better.  Past Medical History:  Diagnosis Date  . Asthma   . Bronchitis 01/31/2016  . Chiari malformation type I ([place])   . Depression   . GERD (gastroesophageal reflux disease)   . H/O multiple concussions   . Headache(784.0)   . History of palpitations   . Hx of fracture of nose   . Mental disorder   . Multiple fractures    Hx: of a leg and arm fracture as a child  . Pneumonia   . PONV (postoperative nausea and vomiting)   . Post traumatic stress disorder (PTSD)   . Pregnant 01/21/2016  . PTSD (post-traumatic stress disorder)   . Sinus infection 01/31/2016  . Supervision of  normal pregnancy, antepartum 02/08/2016    [facility]             13+1 week FOB   Silas Sedam 33 yo WM Dating By  LMP and [place] Pap  02/08/16 GC/CT Initial:                36+wks: Genetic Screen NT/IT:  CF screen  Anatomic [place]  Flu vaccine  Tdap Recommended ~ 28wks Glucose Screen  2 hr GBS  Feed Preference  Contraception  Circumcision  Childbirth Classes  Pediatrician      Patient Active Problem List   Diagnosis Date Noted  . Vitamin D deficiency 09/11/2019  . Hyperlipidemia 09/11/2019  . Status post surgery 12/16/2018  . Nonallopathic lesion of sacral region 12/02/2018  . Palpitations 07/30/2018  . Acute bilateral low back pain without sciatica 07/19/2018  . Near syncope 06/25/2018  . Chiari I malformation ([place]) 02/13/2018  . Nonallopathic lesion of cervical region 02/13/2018  . Nonallopathic lesion of thoracic region 02/13/2018  . Nonallopathic lesion of lumbosacral region 02/13/2018  . Biomechanical lesion, unspecified 02/13/2018  . Current smoker 04/20/2017  . Headache 04/20/2017  . Encounter for initial prescription of contraceptive pills 02/21/2017  . Muscle tension headache 02/21/2017  . Postpartum depression 10/10/2016  . Rubella non-immune  status, antepartum 07/04/2016  . Susceptible to varicella (non-immune), currently pregnant 07/04/2016  . CIN I (cervical intraepithelial neoplasia I) 06/28/2015  . Abnormal Pap smear of cervix 07/22/2014  . HPV in female 07/22/2014  . Mild dysplasia of cervix 07/22/2014  . ASCUS with positive high risk HPV 07/08/2013  . Asthma 06/04/2013  . Depression 06/04/2013    Past Surgical History:  Procedure Laterality Date  . botox injections    . colposcopy    . DILATION AND CURETTAGE OF UTERUS    . nexplanon    . SUBOCCIPITAL CRANIECTOMY CERVICAL LAMINECTOMY N/A 12/16/2018   Procedure: Cervical one Laminectomy/Chiari decompression;  Surgeon: Donalee Citrin, MD;  Location: [facility] OR;  Service: Neurosurgery;  Laterality: N/A;   . WISDOM TOOTH EXTRACTION       OB History    Gravida  5   Para  4   Term  4   Preterm      AB  1   Living  4     SAB  1   TAB      Ectopic      Multiple  0   Live Births  4            Home Medications    Prior to Admission medications   Medication Sig Start Date End Date Taking? Authorizing Provider  cephALEXin (KEFLEX) 500 MG capsule Take 1 capsule (500 mg total) by mouth 3 (three) times daily for 7 days. 09/24/19 10/01/19  Eber Hong, MD  fluticasone (FLONASE) 50 MCG/ACT nasal spray Place 2 sprays into both nostrils daily.     [provider]  montelukast (SINGULAIR) 10 MG tablet Take 1 tablet (10 mg total) by mouth at bedtime. 01/31/16   Adline Potter, NP  VITAMIN D PO Take 2,000 Int'l Units by mouth daily.     [provider]  Vitamin D, Ergocalciferol, (DRISDOL) 1.25 MG (50000 UT) CAPS capsule Take 1 capsule (50,000 Units total) by mouth every 7 (seven) days for 12 doses. 09/11/19 11/28/19  Henderson Cloud, MD    Family History Family History  Problem Relation Age of Onset  . Cancer Mother 71       breast  . Hypertension Mother   . Depression Mother   . Depression Father   . Seizures Father   . Cancer Maternal Grandmother        breast and cervical  . Asthma Son   . Asthma Son   . Hypertension Other   . Cancer Other     Social History Social History   Tobacco Use  . Smoking status: Current Every Day Smoker    Packs/day: 0.50    Types: Cigarettes  . Smokeless tobacco: Never Used  . Tobacco comment: NEVER USE SNUFF OR CHEWING TOBACCO  Substance Use Topics  . Alcohol use: Yes    Comment: social  . Drug use: No     Allergies   Codeine, Peanut-containing drug products, Sulfa antibiotics, and Robaxin [methocarbamol]   Review of Systems Review of Systems  All other systems reviewed and are negative.    Physical Exam Updated Vital Signs BP 114/88 (BP Location: Left Arm)   Pulse 95   Temp 98.2  F (36.8 C) (Oral)   Resp (!) 22   Ht 1.626 m (5\' 4" )   Wt 79.4 kg   LMP 09/13/2019   SpO2 95%   BMI 30.04 kg/m   Physical Exam Vitals signs and nursing note reviewed.  Constitutional:  General: She is not in acute distress.    Appearance: She is well-developed.  HENT:     Head: Normocephalic and atraumatic.     Mouth/Throat:     Pharynx: No oropharyngeal exudate.  Eyes:     General: No scleral icterus.       Right eye: No discharge.        Left eye: No discharge.     Conjunctiva/sclera: Conjunctivae normal.     Pupils: Pupils are equal, round, and reactive to light.  Neck:     Musculoskeletal: Normal range of motion and neck supple.     Thyroid: No thyromegaly.     Vascular: No JVD.  Cardiovascular:     Rate and Rhythm: Normal rate and regular rhythm.     Heart sounds: Normal heart sounds. No murmur. No friction rub. No gallop.   Pulmonary:     Effort: Pulmonary effort is normal. No respiratory distress.     Breath sounds: Normal breath sounds. No wheezing or rales.  Abdominal:     General: Bowel sounds are normal. There is no distension.     Palpations: Abdomen is soft. There is no mass.     Tenderness: There is no abdominal tenderness.  Musculoskeletal: Normal range of motion.        General: No tenderness.  Lymphadenopathy:     Cervical: No cervical adenopathy.  Skin:    General: Skin is warm and dry.     Findings: Erythema present. No rash.     Comments: There is a 4 cm area of erythema with mild tenderness but no induration, no purulence, no fluctuance, no open sores on the right shoulder laterally  Neurological:     Mental Status: She is alert.     Coordination: Coordination normal.  Psychiatric:        Behavior: Behavior normal.      ED Treatments / Results  Labs (all labs ordered are listed, but only abnormal results are displayed) Labs Reviewed - No data to display  EKG EKG Interpretation  Date/Time:  Wednesday September 24 2019 10:48:45 EDT  Ventricular Rate:  89 PR Interval:    QRS Duration: 104 QT Interval:  348 QTC Calculation: 424 R Axis:   96 Text Interpretation:  Sinus rhythm Consider right ventricular hypertrophy ST elev, probable normal early repol pattern Baseline wander in lead(s) II III aVF No old tracing to compare Confirmed by Eber HongMiller, Harjit Leider ([id number]) on 09/24/2019 10:55:03 AM   Radiology Dg Chest 2 View  Result Date: 09/24/2019 CLINICAL DATA:  Left-sided chest pain. Redness and warmth around immunization site to the right arm, which was performed on 09/11/2019. EXAM: CHEST - 2 VIEW COMPARISON:  11/06/2015 FINDINGS: Cardiac silhouette is normal in size. There is opacity at the right cardiophrenic angle, which appears to have increased from prior radiographs. Remaining mediastinal contours are within normal limits. No hilar masses or evidence of adenopathy. Lungs otherwise clear.  No pleural effusion or pneumothorax. Skeletal structures are intact. IMPRESSION: 1. Opacity at the right cardiophrenic angle which appears increased from prior chest radiographs. Although this still may reflect prominent cardiophrenic angle fat, atelectasis/scarring or a combination, the apparent change warrants further assessment. Recommend follow-up chest CT without contrast. 2. Exam otherwise unremarkable. No findings to account for left-sided chest pain. Electronically Signed   By: Amie Portlandavid  Ormond M.D.   On: 09/24/2019 12:30    Procedures Procedures (including critical care time)  Medications Ordered in ED Medications  cephALEXin (KEFLEX) capsule 500 mg (500  mg Oral Given 09/24/19 1135)     Initial Impression / Assessment and Plan / ED Course  I have reviewed the triage vital signs and the nursing notes.  Pertinent labs & imaging results that were available during my care of the patient were reviewed by me and considered in my medical decision making (see chart for details).  Clinical Course as of Sep 24 1235  Wed Sep 24, 2019  1233  Need for CT scan explained to the patient.  She will be given a copy of this x-ray, so she can follow-up with her family doctor.  Patient agreeable, vital signs unremarkable.  EKG unremarkable, stable for discharge   [BM]    Clinical Course User Index [BM] Eber Hong, MD      We will perform an EKG and a chest x-ray to further evaluate the cause of the patient's chest pain however this is unlikely to be cardiac.  As far as her right shoulder goes this does appear to be either a delayed reaction to the vaccination or possibly a cellulitis.  She appears well without fever or tachycardia, will give Keflex and she can follow-up outpatient.  She has no signs of systemic reaction including swelling urticaria or oropharyngeal or airway involvement.  She has clear lungs, clear heart sounds, no signs of anaphylaxis or severe reaction.    Final Clinical Impressions(s) / ED Diagnoses   Final diagnoses:  Non-cardiac chest pain  Abnormal chest x-ray    ED Discharge Orders         Ordered    cephALEXin (KEFLEX) 500 MG capsule  3 times daily     09/24/19 1235           Eber Hong, MD 09/24/19 1236

## 2019-09-24 NOTE — Telephone Encounter (Signed)
Patient went to ER>

## 2019-09-24 NOTE — ED Triage Notes (Signed)
Pt c/o redness and warmth around immunization site to right arm that was done on 09/11/19. Pt reports on 10/9 she started having left sided chest pain. Pt reports she has PTSD and thought maybe she was having a panic attack although she hasn't had one in a long time. Pt did her breathing exercises and reports it started feeling better. Pt called her PCP and was told to come to the ED.

## 2019-09-24 NOTE — Discharge Instructions (Signed)
Your chest x-ray had a very slight abnormality that will need to be followed up with a CT scan done as an outpatient over the next couple of weeks.  This is very subtle and may not be anything at all but is slightly abnormal compared to the prior x-ray.  You have no signs of heart attack, your arm shows some signs of early cellulitis so please take the antibiotic called Keflex 3 times daily for the next 7 days  Seek medical exam for severe or worsening symptoms.  Please share your results with your family doctor within the next several days to follow-up with CT scan.

## 2019-09-25 ENCOUNTER — Telehealth: Payer: Self-pay | Admitting: *Deleted

## 2019-09-25 NOTE — Telephone Encounter (Signed)
Okay to schedule virtual visit for follow up ER per Dr Jerilee Hoh.  Left message on machine   CRM

## 2019-09-25 NOTE — Telephone Encounter (Signed)
Copied from [place] [phone]. Topic: Appointment Scheduling - Scheduling Inquiry for Clinic >> Sep 25, 2019 12:47 PM Alanda Slim E wrote: Reason for CRM: Pt went to hospital and called to schedule hosp f/u / Pt is waiting on a call if she can be seen on 10.21.20 at 10am . Pt also asked for Dr. Jerilee Hoh opinion on if she should have an in office visit or virtual/ please advise

## 2019-09-26 NOTE — Telephone Encounter (Signed)
Patient requesting call back from [place] to discuss further.

## 2019-09-30 ENCOUNTER — Telehealth (INDEPENDENT_AMBULATORY_CARE_PROVIDER_SITE_OTHER): Payer: Medicare Other | Admitting: Internal Medicine

## 2019-09-30 ENCOUNTER — Other Ambulatory Visit: Payer: Self-pay

## 2019-09-30 DIAGNOSIS — R519 Headache, unspecified: Secondary | ICD-10-CM

## 2019-09-30 DIAGNOSIS — R05 Cough: Secondary | ICD-10-CM | POA: Diagnosis not present

## 2019-09-30 DIAGNOSIS — Z20828 Contact with and (suspected) exposure to other viral communicable diseases: Secondary | ICD-10-CM

## 2019-09-30 DIAGNOSIS — Z20822 Contact with and (suspected) exposure to covid-19: Secondary | ICD-10-CM

## 2019-09-30 DIAGNOSIS — R0602 Shortness of breath: Secondary | ICD-10-CM

## 2019-09-30 MED ORDER — ALBUTEROL SULFATE HFA 108 (90 BASE) MCG/ACT IN AERS: 2.0000 | INHALATION_SPRAY | Freq: Four times a day (QID) | RESPIRATORY_TRACT | 2 refills | Status: DC | PRN

## 2019-09-30 NOTE — Telephone Encounter (Signed)
Was discussed at virtual office visit 09/30/2019

## 2019-09-30 NOTE — Progress Notes (Signed)
Virtual Visit via Telephone Note  I connected with Mary [name] on 09/30/19 at  3:00 PM EDT by telephone and verified that I am speaking with the correct person using two identifiers.   I discussed the limitations, risks, security and privacy concerns of performing an evaluation and management service by telephone and the availability of in person appointments. I also discussed with the patient that there may be a patient responsible charge related to this service. The patient expressed understanding and agreed to proceed.  We initially attempted to connect via video chat but were unable to due to technical difficulties on the patient's end, so we converted this visit to a phone visit.   Location patient: home Location provider: work office Participants present for the call: patient, provider Patient did not have a visit in the prior 7 days to address this/these issue(s).   History of Present Illness:  This visit was initially scheduled as an ED follow up. She was seen last Friday with chest discomfort and a presumed cellulitis of the arm. She was given keflex, which she has not completed as she wanted my opinion. There was a "spot" on her CXR and f/u CT scan was recommended. Since seen in th ED last week, she has progressed to mild SOB, non-productive cough, and HA. She believes this is a flare-up of her asthma that she gets every year around the fall. No fevers, but has had chills. Also hears a "rattling" in her chest.   Observations/Objective: Patient sounds cheerful and well on the phone. I do not appreciate any increased work of breathing. Speech and thought processing are grossly intact. Patient reported vitals: none reported   Current Outpatient Medications:  .  albuterol (VENTOLIN HFA) 108 (90 Base) MCG/ACT inhaler, Inhale 2 puffs into the lungs every 6 (six) hours as needed for wheezing or shortness of breath., Disp: 1 g, Rfl: 2 .  cephALEXin (KEFLEX) 500 MG capsule, Take  1 capsule (500 mg total) by mouth 3 (three) times daily for 7 days., Disp: 21 capsule, Rfl: 0 .  fluticasone (FLONASE) 50 MCG/ACT nasal spray, Place 2 sprays into both nostrils daily. , Disp: , Rfl:  .  montelukast (SINGULAIR) 10 MG tablet, Take 1 tablet (10 mg total) by mouth at bedtime., Disp: 30 tablet, Rfl: 1 .  VITAMIN D PO, Take 2,000 Int'l Units by mouth daily. , Disp: , Rfl:  .  Vitamin D, Ergocalciferol, (DRISDOL) 1.25 MG (50000 UT) CAPS capsule, Take 1 capsule (50,000 Units total) by mouth every 7 (seven) days for 12 doses., Disp: 12 capsule, Rfl: 0  Review of Systems:  Constitutional: Denies fever,diaphoresis, appetite change. HEENT: Denies photophobia, eye pain, redness, hearing loss, ear pain, congestion, sore throat, rhinorrhea, sneezing, mouth sores, trouble swallowing, neck pain, neck stiffness and tinnitus.   Respiratory: Positive for SOB, DOE, cough, chest tightness,  and wheezing.   Cardiovascular: Denies  palpitations and leg swelling.  Gastrointestinal: Denies nausea, vomiting, abdominal pain, diarrhea, constipation, blood in stool and abdominal distention.  Genitourinary: Denies dysuria, urgency, frequency, hematuria, flank pain and difficulty urinating.  Endocrine: Denies: hot or cold intolerance, sweats, changes in hair or nails, polyuria, polydipsia. Musculoskeletal: Denies myalgias, back pain, joint swelling, arthralgias and gait problem.  Skin: Denies pallor, rash and wound.  Neurological: Denies dizziness, seizures, syncope, weakness, light-headedness, numbness and headaches.  Hematological: Denies adenopathy. Easy bruising, personal or family bleeding history  Psychiatric/Behavioral: Denies suicidal ideation, mood changes, confusion, nervousness, sleep disturbance and agitation   Assessment  and Plan:  Encounter by telehealth for suspected COVID-19   -Altho could be c/w with asthma flareup, COVID-19 needs to be ruled out. -She will go to the [place] testing  site. -Advised she complete keflex course that was prescribed. May also use OTC mucinex and anti-histamines. -Refill of proair provided. -Advised repeat ED visit if fevers or increased SOB and cough. -She needs CT chest due to findings on CXR, but will hold until COVID results are back.   I discussed the assessment and treatment plan with the patient. The patient was provided an opportunity to ask questions and all were answered. The patient agreed with the plan and demonstrated an understanding of the instructions.   The patient was advised to call back or seek an in-person evaluation if the symptoms worsen or if the condition fails to improve as anticipated.  I provided 16 minutes of non-face-to-face time during this encounter.   Lelon Frohlich, MD [facility] at [facility]

## 2019-10-01 ENCOUNTER — Other Ambulatory Visit: Payer: Medicare Other

## 2019-10-14 ENCOUNTER — Other Ambulatory Visit: Payer: Self-pay

## 2019-10-14 ENCOUNTER — Ambulatory Visit: Payer: Self-pay | Admitting: *Deleted

## 2019-10-14 ENCOUNTER — Telehealth (INDEPENDENT_AMBULATORY_CARE_PROVIDER_SITE_OTHER): Payer: Medicare Other | Admitting: Internal Medicine

## 2019-10-14 DIAGNOSIS — F419 Anxiety disorder, unspecified: Secondary | ICD-10-CM

## 2019-10-14 NOTE — Progress Notes (Signed)
Virtual Visit via Telephone Note  I connected with Mary [name] on 10/14/19 at  3:30 PM EST by telephone and verified that I am speaking with the correct person using two identifiers.   I discussed the limitations, risks, security and privacy concerns of performing an evaluation and management service by telephone and the availability of in person appointments. I also discussed with the patient that there may be a patient responsible charge related to this service. The patient expressed understanding and agreed to proceed.  We initially attempted to connect via video chat but were unable to due to technical difficulties on the patient's end, so we converted this visit to a phone visit.   Location patient: home Location provider: work office Participants present for the call: patient, provider Patient did not have a visit in the prior 7 days to address this/these issue(s).   History of Present Illness:  She has scheduled this visit to discuss anxiety. She has had a significant increase in stress over the past few months: she had surgery for her Budd Chiari Malformation, she has started [place], she has young children and she is separating from her husband. Also her MIL was just diagnosed with terminal Lung cancer. States "sometimes I feel like half a person". She does not feel depressed, she does not have suicidal ideation. Yoga helps. She has had CBT in the past that helped as well. She has a h/o PTSD, stopped seeing her previous mental health clinic as it was 1.5 hours away. She has been tried on multiple medications in the past. I mentioned zoloft, wellbutrin, lexapro and buspirone as possible medication alternatives; however she has had either untoward side effects with these meds or felt like they "didn't work". States xanax worked for her in the past. States her anxiety mainly manifests as chest discomfort and sometimes palpitations. Was seen in the ED about 3 weeks ago for this and  workup was uneventful.   Observations/Objective: Patient sounds cheerful and well on the phone. I do not appreciate any increased work of breathing. Speech and thought processing are grossly intact. Patient reported vitals: none reported   Current Outpatient Medications:    albuterol (VENTOLIN HFA) 108 (90 Base) MCG/ACT inhaler, Inhale 2 puffs into the lungs every 6 (six) hours as needed for wheezing or shortness of breath., Disp: 1 g, Rfl: 2   fluticasone (FLONASE) 50 MCG/ACT nasal spray, Place 2 sprays into both nostrils daily. , Disp: , Rfl:    montelukast (SINGULAIR) 10 MG tablet, Take 1 tablet (10 mg total) by mouth at bedtime., Disp: 30 tablet, Rfl: 1   VITAMIN D PO, Take 2,000 Int'l Units by mouth daily. , Disp: , Rfl:    Vitamin D, Ergocalciferol, (DRISDOL) 1.25 MG (50000 UT) CAPS capsule, Take 1 capsule (50,000 Units total) by mouth every 7 (seven) days for 12 doses., Disp: 12 capsule, Rfl: 0  Review of Systems:  Constitutional: Denies fever, chills, diaphoresis, appetite change and fatigue.  HEENT: Denies photophobia, eye pain, redness, hearing loss, ear pain, congestion, sore throat, rhinorrhea, sneezing, mouth sores, trouble swallowing, neck pain, neck stiffness and tinnitus.   Respiratory: Denies SOB, DOE, cough, chest tightness,  and wheezing.   Cardiovascular: Denies palpitations and leg swelling.  Gastrointestinal: Denies nausea, vomiting, abdominal pain, diarrhea, constipation, blood in stool and abdominal distention.  Genitourinary: Denies dysuria, urgency, frequency, hematuria, flank pain and difficulty urinating.  Endocrine: Denies: hot or cold intolerance, sweats, changes in hair or nails, polyuria, polydipsia. Musculoskeletal: Denies  myalgias, back pain, joint swelling, arthralgias and gait problem.  Skin: Denies pallor, rash and wound.  Neurological: Denies dizziness, seizures, syncope, weakness, light-headedness, numbness and headaches.  Hematological: Denies  adenopathy. Easy bruising, personal or family bleeding history  Psychiatric/Behavioral: Denies suicidal ideation,  confusion, nervousness, sleep disturbance and agitation   Assessment and Plan:  Anxiety -As she has some concerns about all the medications I have offered, have recommended psychiatry referral. -She will think about the options I have given her tonight and will contact me tomorrow with her choice of medication or psych referral. -She will also consider resuming CBT sessions. -I have also advised in person appointment to assess for other potential causes of CP and she will schedule.   I discussed the assessment and treatment plan with the patient. The patient was provided an opportunity to ask questions and all were answered. The patient agreed with the plan and demonstrated an understanding of the instructions.   The patient was advised to call back or seek an in-person evaluation if the symptoms worsen or if the condition fails to improve as anticipated.  I provided 24 minutes of non-face-to-face time during this encounter.   Chaya Jan, MD [facility] at [facility]

## 2019-10-14 NOTE — Telephone Encounter (Signed)
Noted. Appointment scheduled.

## 2019-10-14 NOTE — Telephone Encounter (Signed)
Patient was checked for chest pain mid-October at ED- patient was diagnosed with panic attack. Chest pain has been off and on- has increased the last 2 days. Patient has had a lot going on- she is under stress. Patient states she would like to talk to PCP about this and see if there is something that will help.  Reason for Disposition . [1] Chest pain lasts > 5 minutes AND [2] occurred in past 3 days (72 hours)  Answer Assessment - Initial Assessment Questions 1. LOCATION: "Where does it hurt?"       Left side above heart- same as previous 2. RADIATION: "Does the pain go anywhere else?" (e.g., into neck, jaw, arms, back)     no 3. ONSET: "When did the chest pain begin?" (Minutes, hours or days)      2 weeks ago- comes and goes, on going pain 4. PATTERN "Does the pain come and go, or has it been constant since it started?"  "Does it get worse with exertion?"      Comes and goes- patient feels she can't be active 5. DURATION: "How long does it last" (e.g., seconds, minutes, hours)     Pain last all day- patient did not sleep well last night 6. SEVERITY: "How bad is the pain?"  (e.g., Scale 1-10; mild, moderate, or severe)    - MILD (1-3): doesn't interfere with normal activities     - MODERATE (4-7): interferes with normal activities or awakens from sleep    - SEVERE (8-10): excruciating pain, unable to do any normal activities       Moderate- sharp stabbing pain 7. CARDIAC RISK FACTORS: "Do you have any history of heart problems or risk factors for heart disease?" (e.g., angina, prior heart attack; diabetes, high blood pressure, high cholesterol, smoker, or strong family history of heart disease)     No- borderline high BP 8. PULMONARY RISK FACTORS: "Do you have any history of lung disease?"  (e.g., blood clots in lung, asthma, emphysema, birth control pills)     asthma 9. CAUSE: "What do you think is causing the chest pain?"     Stress related 10. OTHER SYMPTOMS: "Do you have any other  symptoms?" (e.g., dizziness, nausea, vomiting, sweating, fever, difficulty breathing, cough)       no 11. PREGNANCY: "Is there any chance you are pregnant?" "When was your last menstrual period?"       No- LMP- 10/21 ended  Protocols used: CHEST PAIN-A-AH

## 2019-10-20 ENCOUNTER — Encounter: Payer: Self-pay | Admitting: Internal Medicine

## 2019-10-20 DIAGNOSIS — H10023 Other mucopurulent conjunctivitis, bilateral: Secondary | ICD-10-CM | POA: Diagnosis not present

## 2019-10-20 DIAGNOSIS — G44209 Tension-type headache, unspecified, not intractable: Secondary | ICD-10-CM | POA: Diagnosis not present

## 2019-10-21 ENCOUNTER — Other Ambulatory Visit: Payer: Self-pay | Admitting: Internal Medicine

## 2019-10-21 DIAGNOSIS — F32A Depression, unspecified: Secondary | ICD-10-CM

## 2019-10-21 DIAGNOSIS — F329 Major depressive disorder, single episode, unspecified: Secondary | ICD-10-CM

## 2019-10-21 MED ORDER — BUPROPION HCL ER (XL) 150 MG PO TB24: 150.0000 mg | ORAL_TABLET | Freq: Every day | ORAL | 1 refills | Status: DC
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
## 2019-10-30 ENCOUNTER — Ambulatory Visit: Payer: Medicare Other | Admitting: Neurology
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
## 2019-11-10 ENCOUNTER — Telehealth (INDEPENDENT_AMBULATORY_CARE_PROVIDER_SITE_OTHER): Payer: Medicare Other | Admitting: Neurology

## 2019-11-10 ENCOUNTER — Encounter: Payer: Self-pay | Admitting: Neurology

## 2019-11-10 ENCOUNTER — Other Ambulatory Visit: Payer: Self-pay

## 2019-11-10 VITALS — Ht 64.0 in | Wt 168.0 lb

## 2019-11-10 DIAGNOSIS — G43009 Migraine without aura, not intractable, without status migrainosus: Secondary | ICD-10-CM

## 2019-11-10 DIAGNOSIS — G935 Compression of brain: Secondary | ICD-10-CM

## 2019-11-10 MED ORDER — RIZATRIPTAN BENZOATE 10 MG PO TABS: ORAL_TABLET | ORAL | 3 refills | Status: DC

## 2019-11-10 MED ORDER — AIMOVIG 70 MG/ML ~~LOC~~ SOAJ: 70.0000 mg | SUBCUTANEOUS | 11 refills | Status: DC

## 2019-11-10 NOTE — Progress Notes (Signed)
Waiting for Determination Please wait for [employer] 2017 NCP[employer] to return a determination.  Via covermymeds

## 2019-11-11 NOTE — Progress Notes (Signed)
Received hard fax from [facility] regarding Rx  Status: approve  Through 12/10/2020 under medicare part D  Ref# [id number] Pt ID# [id number]

## 2019-12-02 ENCOUNTER — Inpatient Hospital Stay: Admission: RE | Admit: 2019-12-02 | Payer: Medicare Other | Source: Ambulatory Visit

## 2019-12-02 ENCOUNTER — Other Ambulatory Visit: Payer: Self-pay

## 2019-12-02 ENCOUNTER — Ambulatory Visit
Admission: RE | Admit: 2019-12-02 | Discharge: 2019-12-02 | Disposition: A | Discharge status: Home / Self Care | Payer: Medicare Other | Source: Ambulatory Visit | Attending: Neurosurgery | Admitting: Neurosurgery

## 2019-12-02 DIAGNOSIS — M48061 Spinal stenosis, lumbar region without neurogenic claudication: Secondary | ICD-10-CM | POA: Diagnosis not present

## 2019-12-02 DIAGNOSIS — M5440 Lumbago with sciatica, unspecified side: Secondary | ICD-10-CM

## 2019-12-23 DIAGNOSIS — M5440 Lumbago with sciatica, unspecified side: Secondary | ICD-10-CM | POA: Diagnosis not present

## 2019-12-23 DIAGNOSIS — G935 Compression of brain: Secondary | ICD-10-CM | POA: Diagnosis not present

## 2020-02-01 DIAGNOSIS — R079 Chest pain, unspecified: Secondary | ICD-10-CM | POA: Diagnosis not present

## 2020-02-03 ENCOUNTER — Other Ambulatory Visit: Payer: Self-pay

## 2020-02-04 ENCOUNTER — Encounter: Payer: Self-pay | Admitting: Family Medicine

## 2020-02-04 ENCOUNTER — Ambulatory Visit (INDEPENDENT_AMBULATORY_CARE_PROVIDER_SITE_OTHER): Payer: Medicare Other | Admitting: Family Medicine

## 2020-02-04 VITALS — BP 110/80 | HR 104 | Temp 97.1°F | Wt 166.0 lb

## 2020-02-04 DIAGNOSIS — F411 Generalized anxiety disorder: Secondary | ICD-10-CM

## 2020-02-04 DIAGNOSIS — F410 Panic disorder [episodic paroxysmal anxiety] without agoraphobia: Secondary | ICD-10-CM | POA: Diagnosis not present

## 2020-02-04 MED ORDER — VITAMIN D3 1.25 MG (50000 UT) PO CAPS: ORAL_CAPSULE | ORAL | 3 refills | Status: DC

## 2020-02-04 MED ORDER — BUSPIRONE HCL 10 MG PO TABS: 10.0000 mg | ORAL_TABLET | Freq: Three times a day (TID) | ORAL | 1 refills | Status: DC
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
## 2020-02-26 ENCOUNTER — Ambulatory Visit (INDEPENDENT_AMBULATORY_CARE_PROVIDER_SITE_OTHER): Payer: Medicare Other | Admitting: Psychiatry

## 2020-02-26 ENCOUNTER — Encounter (HOSPITAL_COMMUNITY): Payer: Self-pay | Admitting: Psychiatry

## 2020-02-26 ENCOUNTER — Other Ambulatory Visit: Payer: Self-pay

## 2020-02-26 DIAGNOSIS — F411 Generalized anxiety disorder: Secondary | ICD-10-CM | POA: Diagnosis not present

## 2020-02-26 DIAGNOSIS — F4310 Post-traumatic stress disorder, unspecified: Secondary | ICD-10-CM

## 2020-02-26 DIAGNOSIS — F410 Panic disorder [episodic paroxysmal anxiety] without agoraphobia: Secondary | ICD-10-CM | POA: Diagnosis not present

## 2020-02-26 NOTE — Patient Instructions (Signed)
1. Referral to therapy  2. I would advise to start antidepressant (such as duloxetine) for anxiety  3. Next appointment: 4/14 at 11:20
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
## 2020-03-16 ENCOUNTER — Other Ambulatory Visit: Payer: Self-pay

## 2020-03-16 ENCOUNTER — Ambulatory Visit (INDEPENDENT_AMBULATORY_CARE_PROVIDER_SITE_OTHER): Payer: Medicare Other | Admitting: Neurology

## 2020-03-16 ENCOUNTER — Encounter: Payer: Self-pay | Admitting: Neurology

## 2020-03-16 VITALS — BP 116/82 | HR 83 | Ht 64.0 in | Wt 169.0 lb

## 2020-03-16 DIAGNOSIS — M5416 Radiculopathy, lumbar region: Secondary | ICD-10-CM

## 2020-03-16 DIAGNOSIS — G43009 Migraine without aura, not intractable, without status migrainosus: Secondary | ICD-10-CM

## 2020-03-16 DIAGNOSIS — G935 Compression of brain: Secondary | ICD-10-CM | POA: Diagnosis not present

## 2020-03-16 MED ORDER — AIMOVIG 70 MG/ML ~~LOC~~ SOAJ: 70.0000 mg | SUBCUTANEOUS | 11 refills | Status: DC

## 2020-03-16 MED ORDER — RIZATRIPTAN BENZOATE 10 MG PO TABS: ORAL_TABLET | ORAL | 3 refills | Status: DC

## 2020-03-16 NOTE — Patient Instructions (Signed)
1.  Refilled rizatriptan 2.  Prescribed Aimovig 70mg  injection every 28 days 3.  Limit use of pain relievers to no more than 2 days out of week to prevent risk of rebound or medication-overuse headache. 4.  Keep headache diary 5.  Follow up in 4 months.

## 2020-03-17 ENCOUNTER — Encounter: Payer: Self-pay | Admitting: Advanced Practice Midwife

## 2020-03-17 ENCOUNTER — Ambulatory Visit (INDEPENDENT_AMBULATORY_CARE_PROVIDER_SITE_OTHER): Payer: Medicare Other | Admitting: Advanced Practice Midwife

## 2020-03-17 ENCOUNTER — Other Ambulatory Visit: Payer: Self-pay

## 2020-03-17 VITALS — BP 128/90 | HR 88 | Ht 64.0 in | Wt 172.0 lb

## 2020-03-17 DIAGNOSIS — Z3202 Encounter for pregnancy test, result negative: Secondary | ICD-10-CM

## 2020-03-17 DIAGNOSIS — N946 Dysmenorrhea, unspecified: Secondary | ICD-10-CM

## 2020-03-17 LAB — POCT URINE PREGNANCY: Preg Test, Ur: NEGATIVE

## 2020-03-17 NOTE — Progress Notes (Signed)
   GYN VISIT Patient name: Mary [name] MRN [id number]  Date of birth: 1986/02/24 Chief Complaint:   having irregular periods (painful periods)  History of Present Illness:   Mary [name] is a 34 y.o. [phone] Caucasian female being seen today for having a longer than typical cycle this month, proceeded by abd/pelvic cramping for the week prior. Has never had cramps this painful before. Is interested in trying to conceive in the near future, so prefers not to take contraception for cycle management. This is a new partner for her. Of note, she was dx with Chiari malformation in 2019 with decompression in January 2020 (Dr Wynetta Emery).  Depression screen [facility] 2/9 09/11/2019 10/09/2017 05/15/2017 04/20/2017  Decreased Interest 0 1 0 0  Down, Depressed, Hopeless 0 1 0 0  PHQ - 2 Score 0 2 0 0  Altered sleeping 0 3 - -  Tired, decreased energy 0 2 - -  Change in appetite 0 2 - -  Feeling bad or failure about yourself  0 2 - -  Trouble concentrating 2 3 - -  Moving slowly or fidgety/restless 0 1 - -  Suicidal thoughts 0 0 - -  PHQ-9 Score 2 15 - -  Difficult doing work/chores Not difficult at all - - -    Patient's last menstrual period was 03/09/2020. The current method of family planning is none.  Last pap Feb 2017. Results were:  normal Review of Systems:   Pertinent items are noted in HPI Denies fever/chills, dizziness, headaches, visual disturbances, fatigue, shortness of breath, chest pain, abdominal pain, vomiting, abnormal vaginal discharge/itching/odor/irritation, problems with periods, bowel movements, urination, or intercourse unless otherwise stated above.  Pertinent History Reviewed:  Reviewed past medical,surgical, social, obstetrical and family history.  Reviewed problem list, medications and allergies. Physical Assessment:   Vitals:   03/17/20 1455  BP: 128/90  Pulse: 88  Weight: 172 lb (78 kg)  Height: 5\' 4"  (1.626 m)  Body mass index is 29.52 kg/m.       Physical  Examination:   General appearance: alert, well appearing, and in no distress  Mental status: alert, oriented to person, place, and time  Skin: warm & dry   Cardiovascular: normal heart rate noted  Respiratory: normal respiratory effort, no distress  Abdomen: soft, non-tender   Pelvic: examination not indicated  Extremities: no edema     Results for orders placed or performed in visit on 03/17/20 (from the past 24 hour(s))  POCT urine pregnancy   Collection Time: 03/17/20  2:56 PM  Result Value Ref Range   Preg Test, Ur Negative Negative    Assessment & Plan:  1) Dysmenorrhea with last cycle> rx Naprosyn to take if this persists  2) Chiari malformation> recommend speak with neurologist prior to attempting to conceive in order to review concerns with pregnancy/L&D  Meds: No orders of the defined types were placed in this encounter.   Orders Placed This Encounter  Procedures  . POCT urine pregnancy    Return for Pap & Physical, prn.  05/17/20 [facility] 03/17/2020 3:31 PM

## 2020-03-18 MED ORDER — NAPROXEN 500 MG PO TABS: 500.0000 mg | ORAL_TABLET | Freq: Two times a day (BID) | ORAL | 1 refills | Status: DC
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
## 2020-03-24 ENCOUNTER — Encounter (HOSPITAL_COMMUNITY): Payer: Self-pay | Admitting: Psychiatry

## 2020-03-24 ENCOUNTER — Other Ambulatory Visit: Payer: Self-pay

## 2020-03-24 ENCOUNTER — Ambulatory Visit (INDEPENDENT_AMBULATORY_CARE_PROVIDER_SITE_OTHER): Payer: Medicare Other | Admitting: Psychiatry

## 2020-03-24 DIAGNOSIS — F410 Panic disorder [episodic paroxysmal anxiety] without agoraphobia: Secondary | ICD-10-CM

## 2020-03-24 DIAGNOSIS — F4310 Post-traumatic stress disorder, unspecified: Secondary | ICD-10-CM

## 2020-03-24 DIAGNOSIS — F411 Generalized anxiety disorder: Secondary | ICD-10-CM

## 2020-03-24 NOTE — Patient Instructions (Addendum)
I would recommend you start duloxetine for your mood symptoms. Please contact our office for follow up if you are interested in pharmacological treatment.

## 2020-03-26 ENCOUNTER — Other Ambulatory Visit: Payer: Self-pay

## 2020-03-26 DIAGNOSIS — J0100 Acute maxillary sinusitis, unspecified: Secondary | ICD-10-CM | POA: Diagnosis not present

## 2020-03-26 MED ORDER — RIZATRIPTAN BENZOATE 10 MG PO TABS: ORAL_TABLET | ORAL | 3 refills | Status: DC

## 2020-03-31 ENCOUNTER — Other Ambulatory Visit (HOSPITAL_COMMUNITY)
Admission: RE | Admit: 2020-03-31 | Discharge: 2020-03-31 | Disposition: A | Discharge status: Home / Self Care | Payer: Medicare Other | Source: Ambulatory Visit | Attending: Advanced Practice Midwife | Admitting: Advanced Practice Midwife

## 2020-03-31 ENCOUNTER — Encounter: Payer: Self-pay | Admitting: Advanced Practice Midwife

## 2020-03-31 ENCOUNTER — Ambulatory Visit (INDEPENDENT_AMBULATORY_CARE_PROVIDER_SITE_OTHER): Payer: Medicare Other | Admitting: Advanced Practice Midwife

## 2020-03-31 ENCOUNTER — Other Ambulatory Visit: Payer: Self-pay

## 2020-03-31 VITALS — BP 131/89 | HR 91 | Ht 64.0 in | Wt 175.0 lb

## 2020-03-31 DIAGNOSIS — Z01419 Encounter for gynecological examination (general) (routine) without abnormal findings: Secondary | ICD-10-CM | POA: Insufficient documentation

## 2020-03-31 DIAGNOSIS — Z1151 Encounter for screening for human papillomavirus (HPV): Secondary | ICD-10-CM | POA: Insufficient documentation

## 2020-03-31 NOTE — Progress Notes (Signed)
WELL-WOMAN EXAMINATION Patient name: Mary [name] MRN [id number]  Date of birth: 11/24/1986 Chief Complaint:   Gynecologic Exam  History of Present Illness:   Mary [name] is a 34 y.o. [phone] Caucasian female being seen today for a routine well-woman exam.  Current complaints: really wanting to be pregnant! Has a new partner; also dx with Chiari malformation in 2019 and will talk to her neuro re implications with pregnancy.  Depression screen [facility] 2/9 03/31/2020 09/11/2019 10/09/2017 05/15/2017 04/20/2017  Decreased Interest 0 0 1 0 0  Down, Depressed, Hopeless 0 0 1 0 0  PHQ - 2 Score 0 0 2 0 0  Altered sleeping 1 0 3 - -  Tired, decreased energy 0 0 2 - -  Change in appetite 0 0 2 - -  Feeling bad or failure about yourself  0 0 2 - -  Trouble concentrating 1 2 3  - -  Moving slowly or fidgety/restless 0 0 1 - -  Suicidal thoughts 0 0 0 - -  PHQ-9 Score 2 2 15  - -  Difficult doing work/chores - Not difficult at all - - -     PCP: Dr      does not desire labs Patient's last menstrual period was 03/09/2020. The current method of family planning is none.  Last pap Feb 2017. Results were: normal. H/O abnormal pap: yes , has had ASCUS with HRHPV in 2014 and dysplasia on colpo March 2015 Last mammogram: never.  Family h/o breast cancer: yes mother dx at age 51- lumpectomy with chemo & radiation, doing well now Last colonoscopy: never. Family h/o colorectal cancer: no Review of Systems:   Pertinent items are noted in HPI Denies any headaches, blurred vision, fatigue, shortness of breath, chest pain, abdominal pain, abnormal vaginal discharge/itching/odor/irritation, problems with periods, bowel movements, urination, or intercourse unless otherwise stated above. Pertinent History Reviewed:  Reviewed past medical,surgical, social and family history.  Reviewed problem list, medications and allergies. Physical Assessment:   Vitals:   03/31/20 1126  BP: 131/89  Pulse:  91  Weight: 175 lb (79.4 kg)  Height: 5\' 4"  (1.626 m)  Body mass index is 30.04 kg/m.        Physical Examination:   General appearance - well appearing, and in no distress  Mental status - alert, oriented to person, place, and time  Psych:  She has a normal mood and affect  Skin - warm and dry, normal color, no suspicious lesions noted  Chest - effort normal, all lung fields clear to auscultation bilaterally  Heart - normal rate and regular rhythm  Neck:  midline trachea, no thyromegaly or nodules  Breasts - breasts appear normal, no suspicious masses, no skin or nipple changes or  axillary nodes  Abdomen - soft, nontender, nondistended, no masses or organomegaly  Pelvic - VULVA: normal appearing vulva with no masses, tenderness or lesions  VAGINA: normal appearing vagina with normal color and discharge, no lesions  CERVIX: normal appearing cervix without discharge or lesions, no CMT  Thin prep pap is done with HR HPV cotesting  UTERUS: uterus is felt to be normal size, shape, consistency and nontender   ADNEXA: No adnexal masses or tenderness noted.  Extremities:  No swelling or varicosities noted  Chaperone: Peggy Dones    No results found for this or any previous visit (from the past 24 hour(s)).  Assessment & Plan:  1) Well-Woman Exam  2) Trying to conceive, to call with +UPT (cycle due  May 2nd); start PNV  3) Dx of Chiari I malformation, speak to neurologist re implications in preg  Labs/procedures today: Pap  Mammogram age 70 or sooner if problems Colonoscopy age 39 or sooner if problems  No orders of the defined types were placed in this encounter.   Meds: No orders of the defined types were placed in this encounter.   Follow-up: Return for prn.  Myrtis Ser CNM 03/31/2020 3:15 PM

## 2020-04-01 LAB — CYTOLOGY - PAP
Adequacy: ABSENT
Comment: NEGATIVE
Diagnosis: NEGATIVE
High risk HPV: NEGATIVE

## 2020-04-15 DIAGNOSIS — Q0700 Arnold-Chiari syndrome without spina bifida or hydrocephalus: Secondary | ICD-10-CM | POA: Diagnosis not present

## 2020-04-15 DIAGNOSIS — G245 Blepharospasm: Secondary | ICD-10-CM | POA: Diagnosis not present

## 2020-07-07 ENCOUNTER — Ambulatory Visit (INDEPENDENT_AMBULATORY_CARE_PROVIDER_SITE_OTHER): Payer: Medicare Other | Admitting: Internal Medicine

## 2020-07-07 ENCOUNTER — Encounter: Payer: Self-pay | Admitting: Internal Medicine

## 2020-07-07 ENCOUNTER — Other Ambulatory Visit: Payer: Self-pay

## 2020-07-07 ENCOUNTER — Other Ambulatory Visit: Payer: Self-pay | Admitting: Internal Medicine

## 2020-07-07 VITALS — BP 102/68 | HR 107 | Temp 98.0°F | Wt 172.9 lb

## 2020-07-07 DIAGNOSIS — E559 Vitamin D deficiency, unspecified: Secondary | ICD-10-CM

## 2020-07-07 DIAGNOSIS — T148XXA Other injury of unspecified body region, initial encounter: Secondary | ICD-10-CM

## 2020-07-07 DIAGNOSIS — R42 Dizziness and giddiness: Secondary | ICD-10-CM | POA: Diagnosis not present

## 2020-07-07 DIAGNOSIS — E785 Hyperlipidemia, unspecified: Secondary | ICD-10-CM

## 2020-07-07 NOTE — Progress Notes (Signed)
Established Patient Office Visit     This visit occurred during the SARS-CoV-2 public health emergency.  Safety protocols were in place, including screening questions prior to the visit, additional usage of staff PPE, and extensive cleaning of exam room while observing appropriate contact time as indicated for disinfecting solutions.    CC/Reason for Visit: Discuss some acute concerns, requests labs.  HPI: Mary [name] is a 34 y.o. female who is coming in today for the above mentioned reasons. Past Medical History is significant for: Budd-Chiari malformation status post repair, migraine headaches followed by neurology, vitamin D deficiency, anxiety/depression for which she has refused medications.  She is here today because she is concerned about excessive bruising and hair loss.  She states she will have unprovoked bruising mainly over her legs.  She does sometimes get dizzy and this can happen at different times of the day, when lying down or sitting up, while driving, usually unprovoked.   Past Medical/Surgical History: Past Medical History:  Diagnosis Date  . Asthma   . Bronchitis 01/31/2016  . Chiari malformation type I ([facility])   . Depression   . GERD (gastroesophageal reflux disease)   . H/O multiple concussions   . Headache(784.0)   . History of palpitations   . Hx of fracture of nose   . Mental disorder   . Multiple fractures    Hx: of a leg and arm fracture as a child  . Pneumonia   . PONV (postoperative nausea and vomiting)   . Post traumatic stress disorder (PTSD)   . Pregnant 01/21/2016  . PTSD (post-traumatic stress disorder)   . Sinus infection 01/31/2016  . Supervision of normal pregnancy, antepartum 02/08/2016    [facility] at             13+1 week FOB   Mario Voong 34 yo WM Dating By  LMP and [place] Pap  02/08/16 GC/CT Initial:                36+wks: Genetic Screen NT/IT:  CF screen  Anatomic [place]  Flu vaccine  Tdap Recommended ~ 28wks Glucose  Screen  2 hr GBS  Feed Preference  Contraception  Circumcision  Childbirth Classes  Pediatrician      Past Surgical History:  Procedure Laterality Date  . botox injections    . colposcopy    . DILATION AND CURETTAGE OF UTERUS    . nexplanon    . SUBOCCIPITAL CRANIECTOMY CERVICAL LAMINECTOMY N/A 12/16/2018   Procedure: Cervical one Laminectomy/Chiari decompression;  Surgeon: Donalee Citrin, MD;  Location: [facility] OR;  Service: Neurosurgery;  Laterality: N/A;  . WISDOM TOOTH EXTRACTION      Social History:  reports that she has been smoking cigarettes. She has been smoking about 0.50 packs per day. She has never used smokeless tobacco. She reports current alcohol use. She reports that she does not use drugs.  Allergies: Allergies  Allergen Reactions  . Codeine Shortness Of Breath and Other (See Comments)    Can not take in liquid form but does not recall exactly what formulation she had Has taken vicodin and percocet without problem per pt  . Peanut-Containing Drug Products Anaphylaxis  . Sulfa Antibiotics Anaphylaxis and Itching  . Robaxin [Methocarbamol] Other (See Comments)    Migraine    Family History:  Family History  Problem Relation Age of Onset  . Cancer Mother 63       breast  . Hypertension Mother   .  Depression Mother   . Depression Father   . Seizures Father   . Cancer Maternal Grandmother        breast and cervical  . Asthma Son   . Asthma Son   . Drug abuse Paternal Grandmother   . Alcohol abuse Paternal Grandmother   . Hypertension Other   . Cancer Other      Current Outpatient Medications:  .  albuterol (VENTOLIN HFA) 108 (90 Base) MCG/ACT inhaler, Inhale 2 puffs into the lungs every 6 (six) hours as needed for wheezing or shortness of breath., Disp: 1 g, Rfl: 2 .  BIOTIN PO, Take by mouth., Disp: , Rfl:  .  busPIRone (BUSPAR) 10 MG tablet, Take 1 tablet (10 mg total) by mouth 3 (three) times daily., Disp: 90 tablet, Rfl: 1 .  Cholecalciferol (VITAMIN D3) 1.25  MG (50000 UT) CAPS, One by mouth once per week., Disp: 12 capsule, Rfl: 3 .  Erenumab-aooe (AIMOVIG) 70 MG/ML SOAJ, Inject 70 mg into the skin every 28 (twenty-eight) days., Disp: 1 pen, Rfl: 11 .  fluticasone (FLONASE) 50 MCG/ACT nasal spray, Place 2 sprays into both nostrils daily. , Disp: , Rfl:  .  montelukast (SINGULAIR) 10 MG tablet, Take 1 tablet (10 mg total) by mouth at bedtime., Disp: 30 tablet, Rfl: 1 .  naproxen (NAPROSYN) 500 MG tablet, Take 1 tablet (500 mg total) by mouth 2 (two) times daily with a meal., Disp: 60 tablet, Rfl: 1 .  rizatriptan (MAXALT) 10 MG tablet, Take 1 tablet earliest onset of migraine.  May repeat in 2 hours if needed.  Maximum 2 tablets in 24 hours, Disp: 10 tablet, Rfl: 3  Review of Systems:  Constitutional: Denies fever, chills, diaphoresis, appetite change. HEENT: Denies photophobia, eye pain, redness, hearing loss, ear pain, congestion, sore throat, rhinorrhea, sneezing, mouth sores, trouble swallowing, neck pain, neck stiffness and tinnitus.   Respiratory: Denies SOB, DOE, cough, chest tightness,  and wheezing.   Cardiovascular: Denies chest pain, palpitations and leg swelling.  Gastrointestinal: Denies nausea, vomiting, abdominal pain, diarrhea, constipation, blood in stool and abdominal distention.  Genitourinary: Denies dysuria, urgency, frequency, hematuria, flank pain and difficulty urinating.  Endocrine: Denies: hot or cold intolerance, sweats,  polyuria, polydipsia. Musculoskeletal: Denies myalgias, back pain, joint swelling, arthralgias and gait problem.  Skin: Denies pallor, rash and wound.  Neurological: Denies  seizures, syncope, weakness, light-headedness, numbness and headaches.  Hematological: Denies adenopathy.  personal or family bleeding history  Psychiatric/Behavioral: Denies suicidal ideation, mood changes, confusion, nervousness, sleep disturbance and agitation    Physical Exam: Vitals:   07/07/20 1012  BP: 102/68  Pulse: (!)  107  Temp: 98 F (36.7 C)  TempSrc: Oral  SpO2: 98%  Weight: 172 lb 14.4 oz (78.4 kg)    Body mass index is 29.68 kg/m.   Constitutional: NAD, calm, comfortable Eyes: PERRL, lids and conjunctivae normal ENMT: Mucous membranes are moist.  Skin: No bruising identified on exam today Neurologic: Grossly intact and nonfocal Psychiatric: Normal judgment and insight. Alert and oriented x 3. Normal mood.    Impression and Plan:  Bruising  - Plan: CBC with Differential/Platelet, Comprehensive metabolic panel -No bruising identified on exam today. -She is concerned about a family history of leukemia, she had normal blood counts in October, will repeat today.  Dizziness  - Plan: TSH, Vitamin B12 -Do not think related to issues with her Budd-Chiari as she has not been having headaches to think that this could be a leak. -More likely related  to her migraines, she will follow-up with neurology if needed.  Hyperlipidemia, unspecified hyperlipidemia type  - Plan: Lipid panel -Not currently on medications.  Vitamin D deficiency  - Plan: VITAMIN D 25 Hydroxy (Vit-D Deficiency, Fractures)    Patient Instructions  -Nice seeing you today!!  -Lab work today; will notify you once results are available.      Chaya Jan, MD [facility]

## 2020-07-07 NOTE — Patient Instructions (Signed)
-  Nice seeing you today!!  -Lab work today; will notify you once results are available.   

## 2020-07-08 LAB — LIPID PANEL
Cholesterol: 186 mg/dL (ref <200)
HDL: 34 mg/dL — ABNORMAL LOW (ref >=50)
LDL Cholesterol (Calc): 122 mg/dL (calc) — ABNORMAL HIGH
Non-HDL Cholesterol (Calc): 152 mg/dL (calc) — ABNORMAL HIGH (ref <130)
Total CHOL/HDL Ratio: 5.5 (calc) — ABNORMAL HIGH (ref <5.0)
Triglycerides: 181 mg/dL — ABNORMAL HIGH (ref <150)

## 2020-07-08 LAB — CBC WITH DIFFERENTIAL/PLATELET
Absolute Monocytes: 524 cells/uL (ref 200–950)
Basophils Absolute: 49 cells/uL (ref 0–200)
Basophils Relative: 0.5 %
Eosinophils Absolute: 407 cells/uL (ref 15–500)
Eosinophils Relative: 4.2 %
HCT: 42 % (ref 35.0–45.0)
Hemoglobin: 13.8 g/dL (ref 11.7–15.5)
Lymphs Abs: 2289 cells/uL (ref 850–3900)
MCH: 30.5 pg (ref 27.0–33.0)
MCHC: 32.9 g/dL (ref 32.0–36.0)
MCV: 92.7 fL (ref 80.0–100.0)
MPV: 10.2 fL (ref 7.5–12.5)
Monocytes Relative: 5.4 %
Neutro Abs: 6431 cells/uL (ref 1500–7800)
Neutrophils Relative %: 66.3 %
Platelets: 296 10*3/uL (ref 140–400)
RBC: 4.53 10*6/uL (ref 3.80–5.10)
RDW: 13.3 % (ref 11.0–15.0)
Total Lymphocyte: 23.6 %
WBC: 9.7 10*3/uL (ref 3.8–10.8)

## 2020-07-08 LAB — VITAMIN D 25 HYDROXY (VIT D DEFICIENCY, FRACTURES): Vit D, 25-Hydroxy: 43 ng/mL (ref 30–100)

## 2020-07-08 LAB — COMPREHENSIVE METABOLIC PANEL
AG Ratio: 1.5 (calc) (ref 1.0–2.5)
ALT: 11 U/L (ref 6–29)
AST: 15 U/L (ref 10–30)
Albumin: 4.2 g/dL (ref 3.6–5.1)
Alkaline phosphatase (APISO): 69 U/L (ref 31–125)
BUN: 9 mg/dL (ref 7–25)
CO2: 25 mmol/L (ref 20–32)
Calcium: 9.3 mg/dL (ref 8.6–10.2)
Chloride: 104 mmol/L (ref 98–110)
Creat: 0.86 mg/dL (ref 0.50–1.10)
Globulin: 2.8 g/dL (calc) (ref 1.9–3.7)
Glucose, Bld: 83 mg/dL (ref 65–99)
Potassium: 4.3 mmol/L (ref 3.5–5.3)
Sodium: 137 mmol/L (ref 135–146)
Total Bilirubin: 0.5 mg/dL (ref 0.2–1.2)
Total Protein: 7 g/dL (ref 6.1–8.1)

## 2020-07-08 LAB — TSH: TSH: 2.9 mIU/L

## 2020-07-08 LAB — VITAMIN B12: Vitamin B-12: 539 pg/mL (ref 200–1100)

## 2020-07-23 ENCOUNTER — Other Ambulatory Visit: Payer: Self-pay

## 2020-07-23 DIAGNOSIS — G9601 Cranial cerebrospinal fluid leak, spontaneous: Secondary | ICD-10-CM
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
## 2020-07-30 ENCOUNTER — Other Ambulatory Visit: Payer: Self-pay

## 2020-07-30 ENCOUNTER — Ambulatory Visit (INDEPENDENT_AMBULATORY_CARE_PROVIDER_SITE_OTHER): Payer: Medicare Other | Admitting: Neurology

## 2020-07-30 ENCOUNTER — Encounter: Payer: Self-pay | Admitting: Neurology

## 2020-07-30 VITALS — BP 117/86 | HR 90 | Ht 64.0 in | Wt 173.2 lb

## 2020-07-30 DIAGNOSIS — R42 Dizziness and giddiness: Secondary | ICD-10-CM | POA: Diagnosis not present

## 2020-07-30 DIAGNOSIS — G43009 Migraine without aura, not intractable, without status migrainosus: Secondary | ICD-10-CM

## 2020-07-30 DIAGNOSIS — J3489 Other specified disorders of nose and nasal sinuses: Secondary | ICD-10-CM | POA: Diagnosis not present

## 2020-07-30 DIAGNOSIS — G935 Compression of brain: Secondary | ICD-10-CM | POA: Diagnosis not present

## 2020-07-30 NOTE — Patient Instructions (Signed)
1.  Continue Aimovig and rizatriptan or Tylenol 2.  Will refer you to ear, nose and throat. If they rule out things, we will evaluate further 3.  Otherwise, follow up in 6 months.

## 2020-08-04 ENCOUNTER — Telehealth: Payer: Self-pay | Admitting: Neurology

## 2020-08-04 NOTE — Telephone Encounter (Signed)
Patient is scheduled for 08/18/20 at 3:30PM with Dr. Newman Pies for ENT.

## 2020-08-11 ENCOUNTER — Telehealth: Payer: Self-pay | Admitting: Neurology

## 2020-08-11 NOTE — Telephone Encounter (Signed)
That's pretty normal feeling after bumping your head.  It does not necessarily indicate concussion or anything like that.
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
## 2020-08-11 NOTE — Telephone Encounter (Signed)
Pt advised of dr. Everlena Cooper note. Advised if she feels worse please give [place] a call back or go the ED

## 2020-08-18 DIAGNOSIS — R42 Dizziness and giddiness: Secondary | ICD-10-CM | POA: Diagnosis not present

## 2020-08-18 DIAGNOSIS — H6982 Other specified disorders of Eustachian tube, left ear: Secondary | ICD-10-CM | POA: Diagnosis not present

## 2020-08-18 DIAGNOSIS — H9012 Conductive hearing loss, unilateral, left ear, with unrestricted hearing on the contralateral side: Secondary | ICD-10-CM | POA: Diagnosis not present

## 2020-09-04 DIAGNOSIS — Z20822 Contact with and (suspected) exposure to covid-19: Secondary | ICD-10-CM | POA: Diagnosis not present

## 2020-09-07 ENCOUNTER — Telehealth: Payer: Self-pay | Admitting: Internal Medicine

## 2020-09-07 NOTE — Telephone Encounter (Signed)
Patient is requesting a letter stating that PTSD/Depression so she can move to a new apartment.

## 2020-09-07 NOTE — Telephone Encounter (Signed)
Patient is aware.  Patient states she is on disability for PTSD/anxiety and she will call that office to see if they can help also.

## 2020-09-07 NOTE — Telephone Encounter (Signed)
pt needs a note to give  to her landlord about her health [phone] no other information was given

## 2020-09-07 NOTE — Telephone Encounter (Signed)
Ok to send letter stating she has depression. Specify on the letter that she has given perimission to share her medical diagnoses.

## 2020-09-28 ENCOUNTER — Telehealth: Payer: Self-pay | Admitting: Neurology

## 2020-09-28 NOTE — Telephone Encounter (Signed)
Patient called and said, "When I get stressed out, it feels like I'm having a seizure but I'm awake. It seems like it could be a frontal lobe seizure. Yesterday, it was very scary and terrible and I had an electronic shot go up the back of my head. I feel like I need to be seen today."  Routing to triage for review.

## 2020-09-28 NOTE — Telephone Encounter (Signed)
To be honest, this sounds like anxiety, not seizure.  I recommend that she follow up with her PCP

## 2020-09-28 NOTE — Telephone Encounter (Signed)
Please advise 

## 2020-09-28 NOTE — Telephone Encounter (Signed)
Pt called an advised to contact PCP. Patient agreed to do so.

## 2020-09-29 ENCOUNTER — Ambulatory Visit: Payer: Medicare Other | Admitting: Internal Medicine

## 2020-10-12 ENCOUNTER — Telehealth (INDEPENDENT_AMBULATORY_CARE_PROVIDER_SITE_OTHER): Payer: Medicare Other | Admitting: Family Medicine

## 2020-10-12 DIAGNOSIS — R21 Rash and other nonspecific skin eruption: Secondary | ICD-10-CM

## 2020-10-12 MED ORDER — TRIAMCINOLONE ACETONIDE 0.1 % EX CREA: 1.0000 "application " | TOPICAL_CREAM | Freq: Two times a day (BID) | CUTANEOUS | 0 refills | Status: DC

## 2020-10-12 NOTE — Progress Notes (Signed)
Virtual Visit via Video Note  I connected with Mary [name]  on 10/12/20 at 10:40 AM EDT by a video enabled telemedicine application and verified that I am speaking with the correct person using two identifiers.  Location patient: home Location provider:work or home office Persons participating in the virtual visit: patient, provider  I discussed the limitations of evaluation and management by telemedicine and the availability of in person appointments. The patient expressed understanding and agreed to proceed.   HPI:  Acute telemedicine visit for "Eczema": -Onset: has had eczema "my entire life"  -this flare started about 3 weeks ago -Symptoms include: on hands, neck, face - very itchy, dry skin -Denies: fevers, chills, cough, malaise -Has tried: benadryl, allegra, OTC anti-itch cream, hydrocortisone cream, Olay lotion -Pertinent past medical history: Excema -Pertinent medication allergies: asthma, terrible allergies - to many things (trees, grass, pets) -She recently moved into a new apartment and found out that the prior owner had a cat in one room, she is worried that she has had some exposure and she is allergic to cats  ROS: See pertinent positives and negatives per HPI.  Past Medical History:  Diagnosis Date  . Asthma   . Bronchitis 01/31/2016  . Chiari malformation type I (HCC)   . Depression   . GERD (gastroesophageal reflux disease)   . H/O multiple concussions   . Headache(784.0)   . History of palpitations   . Hx of fracture of nose   . Mental disorder   . Multiple fractures    Hx: of a leg and arm fracture as a child  . Pneumonia   . PONV (postoperative nausea and vomiting)   . Post traumatic stress disorder (PTSD)   . Pregnant 01/21/2016  . PTSD (post-traumatic stress disorder)   . Sinus infection 01/31/2016  . Supervision of normal pregnancy, antepartum 02/08/2016    [facility] at             13+1 week FOB   Mikita Lesmeister 34 yo WM Dating By   LMP and [place] Pap  02/08/16 GC/CT Initial:                36+wks: Genetic Screen NT/IT:  CF screen  Anatomic [place]  Flu vaccine  Tdap Recommended ~ 28wks Glucose Screen  2 hr GBS  Feed Preference  Contraception  Circumcision  Childbirth Classes  Pediatrician      Past Surgical History:  Procedure Laterality Date  . botox injections    . colposcopy    . DILATION AND CURETTAGE OF UTERUS    . nexplanon    . SUBOCCIPITAL CRANIECTOMY CERVICAL LAMINECTOMY N/A 12/16/2018   Procedure: Cervical one Laminectomy/Chiari decompression;  Surgeon: Donalee Citrin, MD;  Location: [facility] OR;  Service: Neurosurgery;  Laterality: N/A;  . WISDOM TOOTH EXTRACTION       Current Outpatient Medications:  .  albuterol (VENTOLIN HFA) 108 (90 Base) MCG/ACT inhaler, Inhale 2 puffs into the lungs every 6 (six) hours as needed for wheezing or shortness of breath., Disp: 1 g, Rfl: 2 .  BIOTIN PO, Take by mouth., Disp: , Rfl:  .  busPIRone (BUSPAR) 10 MG tablet, Take 1 tablet (10 mg total) by mouth 3 (three) times daily. (Patient not taking: Reported on 07/30/2020), Disp: 90 tablet, Rfl: 1 .  Cholecalciferol (VITAMIN D3) 1.25 MG (50000 UT) CAPS, One by mouth once per week., Disp: 12 capsule, Rfl: 3 .  Erenumab-aooe (AIMOVIG) 70 MG/ML SOAJ, Inject 70 mg into the skin  every 28 (twenty-eight) days., Disp: 1 pen, Rfl: 11 .  fluticasone (FLONASE) 50 MCG/ACT nasal spray, Place 2 sprays into both nostrils daily. , Disp: , Rfl:  .  montelukast (SINGULAIR) 10 MG tablet, Take 1 tablet (10 mg total) by mouth at bedtime., Disp: 30 tablet, Rfl: 1 .  naproxen (NAPROSYN) 500 MG tablet, Take 1 tablet (500 mg total) by mouth 2 (two) times daily with a meal. (Patient not taking: Reported on 07/30/2020), Disp: 60 tablet, Rfl: 1 .  rizatriptan (MAXALT) 10 MG tablet, Take 1 tablet earliest onset of migraine.  May repeat in 2 hours if needed.  Maximum 2 tablets in 24 hours, Disp: 10 tablet, Rfl: 3 .  triamcinolone cream (KENALOG) 0.1 %, Apply 1 application  topically 2 (two) times daily., Disp: 30 g, Rfl: 0  EXAM:  VITALS per patient if applicable:  GENERAL: alert, oriented, appears well and in no acute distress  HEENT: atraumatic, conjunttiva clear, no obvious abnormalities on inspection of external nose and ears  NECK: normal movements of the head and neck  LUNGS: on inspection no signs of respiratory distress, breathing rate appears normal, no obvious gross SOB, gasping or wheezing  CV: no obvious cyanosis  SKIN: The quality of the video visit is not very good, however I can see several erythematous patches on her neck, one on her face and some irregular erythematous patches on her hand, they appear dry.  2 of the patches on the neck appear to possibly have some central clearing, but it is hard to tell.  MS: moves all visible extremities without noticeable abnormality  PSYCH/NEURO: pleasant and cooperative, no obvious depression or anxiety, speech and thought processing grossly intact  ASSESSMENT AND PLAN:  Discussed the following assessment and plan:  Skin rash  -we discussed possible serious and likely etiologies, options for evaluation and workup, limitations of telemedicine visit vs in person visit, treatment, treatment risks and precautions. Pt prefers to treat via telemedicine empirically rather than in person at this moment.  She feels certain this is eczema, and it certainly sounds like that may be the case.  I did let her know that the video quality was poor, so a good exam was not possible.  We opted to try treatment with an hypoallergenic skin regimen, a good emollient and a topical steroid.  These recommendations were summarized in her patient instructions.  Sent triamcinolone 0.1% cream to her pharmacy.  Recommended an in person follow-up with her primary care doctor in 2 to 4 weeks to ensure improving or resolution, and confirm diagnosis.  Did talk about possibility of fungal infections and other causes of itchy rashes as  well.  She feels certain this is eczema as it looks the same as her prior flares per her report.  Scheduled follow up with PCP offered:   Sent message to schedulers to assist and advised patient to contact PCP office to schedule if does not receive call back in next 24 hours.  Advised to seek prompt in person care if worsening, new symptoms arise, or if is not improving with treatment. Discussed options for inperson care if PCP office not available. Did let this patient know that I only do telemedicine on Tuesdays and Thursdays for [place]. Advised to schedule follow up visit with PCP or [facility] if any further questions or concerns to avoid delays in care.   I discussed the assessment and treatment plan with the patient. The patient was provided an opportunity to ask questions and all were  answered. The patient agreed with the plan and demonstrated an understanding of the instructions.     Lucretia Kern, DO

## 2020-10-12 NOTE — Patient Instructions (Signed)
-  I sent the medication(s) we discussed to your pharmacy: Meds ordered this encounter  Medications  . triamcinolone cream (KENALOG) 0.1 %    Sig: Apply 1 application topically 2 (two) times daily.    Dispense:  30 g    Refill:  0   -use only hypoallergenic laundry detergent, skin wash, facial wash -such as Cetaphil or CeraVe day  -Use a good emollient such as Eucerin for eczema or Aquaphor daily and after washing hands  -Use hydrocortisone 1-2 times daily on the face and neck, use the triamcinolone cream on other areas twice daily (but not in the creases)  I hope you are feeling better soon!  Schedule an in person follow-up appointment with Dr. Ardyth Harps in 2 - 4 weeks.  Seek in person care promptly if your symptoms worsen, new concerns arise or you are not improving with treatment.  It was nice to meet you today. I help [facility] out with telemedicine visits on Tuesdays and Thursdays and am available for visits on those days. If you have any concerns or questions following this visit please schedule a follow up visit with your Primary Care doctor or seek care at a local urgent care clinic to avoid delays in care.

## 2020-10-14 DIAGNOSIS — R21 Rash and other nonspecific skin eruption: Secondary | ICD-10-CM | POA: Diagnosis not present

## 2020-10-14 DIAGNOSIS — L2389 Allergic contact dermatitis due to other agents: Secondary | ICD-10-CM | POA: Diagnosis not present

## 2020-10-20 ENCOUNTER — Other Ambulatory Visit: Payer: Self-pay

## 2020-10-20 ENCOUNTER — Ambulatory Visit (INDEPENDENT_AMBULATORY_CARE_PROVIDER_SITE_OTHER): Payer: Medicare Other | Admitting: Family Medicine

## 2020-10-20 ENCOUNTER — Encounter: Payer: Self-pay | Admitting: Family Medicine

## 2020-10-20 VITALS — BP 110/86 | HR 98 | Temp 99.2°F | Wt 172.2 lb

## 2020-10-20 DIAGNOSIS — M545 Low back pain, unspecified: Secondary | ICD-10-CM | POA: Diagnosis not present

## 2020-10-20 DIAGNOSIS — Z91010 Allergy to peanuts: Secondary | ICD-10-CM | POA: Diagnosis not present

## 2020-10-20 DIAGNOSIS — S39012A Strain of muscle, fascia and tendon of lower back, initial encounter: Secondary | ICD-10-CM | POA: Diagnosis not present

## 2020-10-20 DIAGNOSIS — M546 Pain in thoracic spine: Secondary | ICD-10-CM | POA: Diagnosis not present

## 2020-10-20 DIAGNOSIS — R109 Unspecified abdominal pain: Secondary | ICD-10-CM

## 2020-10-20 DIAGNOSIS — Z79899 Other long term (current) drug therapy: Secondary | ICD-10-CM | POA: Diagnosis not present

## 2020-10-20 DIAGNOSIS — Z882 Allergy status to sulfonamides status: Secondary | ICD-10-CM | POA: Diagnosis not present

## 2020-10-20 DIAGNOSIS — M62838 Other muscle spasm: Secondary | ICD-10-CM | POA: Diagnosis not present

## 2020-10-20 DIAGNOSIS — M4184 Other forms of scoliosis, thoracic region: Secondary | ICD-10-CM | POA: Diagnosis not present

## 2020-10-20 NOTE — Progress Notes (Signed)
Subjective:    Patient ID: Mary [name], female    DOB: September 24, 1986, 34 y.o.   MRN: [id number]  No chief complaint on file. Patient accompanied by her significant other [name].  HPI Patient is a 34 year old female with past medical history significant for history of asthma, Chiari malformation type I, vitamin D deficiency, depression, PTSD, nonallopathic lesions of spine, history of multiple fractures followed by Dr. Ardyth Harps who was seen for acute complaint.  Patient endorses intense right-sided thoracic back and flank pain, 10/10 pain x3 days.  Pt notes pain started after bending over to tie her son's shoe while he was sitting on the bed.  Pain noted as sharp, does not move.  Difficult for pt to get comfortable.  Pt endorses right mid back with swelling.  Pt drinking 1 bottle of water per day recently.  Pt with LLE weakness at baseline, denies any increased symptoms.  Pt denies loss of bowel or bladder, dysuria, suprapubic pain or pressure, nausea/vomiting, fever, chills.   Pt endorses a pain/pulling sensation when bending her neck forward.  Patient mentions recent UC visit.  Given steroid injection in right butt for allergic reaction.  Past Medical History:  Diagnosis Date  . Asthma   . Bronchitis 01/31/2016  . Chiari malformation type I (HCC)   . Depression   . GERD (gastroesophageal reflux disease)   . H/O multiple concussions   . Headache(784.0)   . History of palpitations   . Hx of fracture of nose   . Mental disorder   . Multiple fractures    Hx: of a leg and arm fracture as a child  . Pneumonia   . PONV (postoperative nausea and vomiting)   . Post traumatic stress disorder (PTSD)   . Pregnant 01/21/2016  . PTSD (post-traumatic stress disorder)   . Sinus infection 01/31/2016  . Supervision of normal pregnancy, antepartum 02/08/2016    [facility] at             13+1 week FOB   Breaunna Gottlieb 34 yo WM Dating By  LMP and [place] Pap  02/08/16 GC/CT Initial:                 36+wks: Genetic Screen NT/IT:  CF screen  Anatomic [place]  Flu vaccine  Tdap Recommended ~ 28wks Glucose Screen  2 hr GBS  Feed Preference  Contraception  Circumcision  Childbirth Classes  Pediatrician      Allergies  Allergen Reactions  . Codeine Shortness Of Breath and Other (See Comments)    Can not take in liquid form but does not recall exactly what formulation she had Has taken vicodin and percocet without problem per pt  . Peanut-Containing Drug Products Anaphylaxis  . Sulfa Antibiotics Anaphylaxis and Itching  . Robaxin [Methocarbamol] Other (See Comments)    Migraine    ROS General: Denies fever, chills, night sweats, changes in weight, changes in appetite HEENT: Denies headaches, ear pain, changes in vision, rhinorrhea, sore throat CV: Denies CP, palpitations, SOB, orthopnea Pulm: Denies SOB, cough, wheezing GI: Denies abdominal pain, nausea, vomiting, diarrhea, constipation GU: Denies dysuria, hematuria, frequency, vaginal discharge Msk: Denies muscle cramps, joint pains  + right sided mid back pain/flank pain Neuro: Denies weakness, numbness, tingling  + LLE weakness at baseline Skin: Denies rashes, bruising Psych: Denies depression, anxiety, hallucinations    Objective:    Blood pressure 110/86, pulse 98, temperature 99.2 F (37.3 C), temperature source Oral, weight 172 lb 3.2 oz (78.1 kg),  SpO2 98 %.  Gen. Pleasant, well-nourished, in moderate distress, tearful, anxious. HEENT: [place]/AT, face symmetric, conjunctiva clear, no scleral icterus, PERRLA, EOMI, nares patent without drainage Lungs: no accessory muscle use, CTAB, no wheezes or rales Cardiovascular: RRR, no m/r/g, no peripheral edema Musculoskeletal: Soft tissue edema right upper thoracic spine.  TTP of right lower thoracic spine paraspinal muscles and right flank.  TTP of lower thoracic spine, lumbar spine/sacral spine midline.  No deformities, no cyanosis or clubbing, normal tone Neuro:  A&Ox3, CN II-XII  intact, slowed gait.  Patient had difficulty moving from chair to exam table 2/2 pain in back.  Patient unable to lay flat on exam table 2/2 pain. Skin:  Warm, no lesions/ rash.     Wt Readings from Last 3 Encounters:  10/20/20 172 lb 3.2 oz (78.1 kg)  07/30/20 173 lb 3.2 oz (78.6 kg)  07/07/20 172 lb 14.4 oz (78.4 kg)    Lab Results  Component Value Date   WBC 9.7 07/07/2020   HGB 13.8 07/07/2020   HCT 42.0 07/07/2020   PLT 296 07/07/2020   GLUCOSE 83 07/07/2020   CHOL 186 07/07/2020   TRIG 181 (H) 07/07/2020   HDL 34 (L) 07/07/2020   LDLCALC 122 (H) 07/07/2020   ALT 11 07/07/2020   AST 15 07/07/2020   NA 137 07/07/2020   K 4.3 07/07/2020   CL 104 07/07/2020   CREATININE 0.86 07/07/2020   BUN 9 07/07/2020   CO2 25 07/07/2020   TSH 2.90 07/07/2020   HGBA1C 5.6 09/11/2019    Assessment/Plan:  Acute right-sided thoracic back pain  Acute right flank pain   Patient unable to provide urine sample.  Discussed possible causes of backslash flank pain including renal calculi versus nerve compression from herniated disc.  Also consider meningitis given pain/pulling sensation noted with flexion of neck.  Given degree of pain and ongoing symptoms, patient advised to proceed to nearest emergency department for further evaluation and imaging.  Patient in agreement with this plan.  F/u with PCP as needed.  Abbe Amsterdam, MD

## 2020-10-21 ENCOUNTER — Other Ambulatory Visit: Payer: Self-pay

## 2020-10-21 ENCOUNTER — Telehealth: Payer: Self-pay | Admitting: Internal Medicine

## 2020-10-21 NOTE — Telephone Encounter (Signed)
Patient called back and stated that she was seen yesterday by Dr. Salomon Fick and was instructed to go to the hospital and wanted to speak to Dr. Salomon Fick or the nurse to discuss outcome of the visit, please advise. CB is [phone]

## 2020-10-22 ENCOUNTER — Ambulatory Visit (INDEPENDENT_AMBULATORY_CARE_PROVIDER_SITE_OTHER): Payer: Medicare Other | Admitting: Family Medicine

## 2020-10-22 ENCOUNTER — Other Ambulatory Visit: Payer: Self-pay

## 2020-10-22 ENCOUNTER — Encounter: Payer: Self-pay | Admitting: Family Medicine

## 2020-10-22 VITALS — BP 118/80 | HR 87 | Temp 98.0°F | Wt 176.2 lb

## 2020-10-22 DIAGNOSIS — G935 Compression of brain: Secondary | ICD-10-CM

## 2020-10-22 DIAGNOSIS — T148XXA Other injury of unspecified body region, initial encounter: Secondary | ICD-10-CM | POA: Diagnosis not present

## 2020-10-22 MED ORDER — PREDNISONE 5 MG PO TABS: ORAL_TABLET | ORAL | 0 refills | Status: DC

## 2020-10-22 NOTE — Progress Notes (Signed)
Subjective:    Patient ID: Mary [name], female    DOB: 1986/03/15, 34 y.o.   MRN: [id number]  No chief complaint on file. Pt is a accompanied by her bf.  HPI Patient is a 34 year old female with past medical history significant for asthma, Chiari I malformation, dysplasia of cervix, depression, HPV, tobacco use, palpitations, HLD, vitamin D deficiency who is followed by Dr. Ardyth Harps and seen today for f/u.   Pt seen in the ED for back pain on 10/20/2020. Pt advised symptoms 2/2 back strain. Patient given baclofen and naproxen. Patient notes improvement in symptoms taking baclofen. Has not taken naproxen. Patient endorses continued spinal pain but notes improvement in edema of right mid back.  Pt's bf and kids afraid to touch her 2/2 her level of discomfort.  Patient inquires about referral to pain clinic.   Past Medical History:  Diagnosis Date  . Asthma   . Bronchitis 01/31/2016  . Chiari malformation type I (HCC)   . Depression   . GERD (gastroesophageal reflux disease)   . H/O multiple concussions   . Headache(784.0)   . History of palpitations   . Hx of fracture of nose   . Mental disorder   . Multiple fractures    Hx: of a leg and arm fracture as a child  . Pneumonia   . PONV (postoperative nausea and vomiting)   . Post traumatic stress disorder (PTSD)   . Pregnant 01/21/2016  . PTSD (post-traumatic stress disorder)   . Sinus infection 01/31/2016  . Supervision of normal pregnancy, antepartum 02/08/2016    [facility] at             13+1 week FOB   Keandra Medero 34 yo WM Dating By  LMP and [place] Pap  02/08/16 GC/CT Initial:                36+wks: Genetic Screen NT/IT:  CF screen  Anatomic [place]  Flu vaccine  Tdap Recommended ~ 28wks Glucose Screen  2 hr GBS  Feed Preference  Contraception  Circumcision  Childbirth Classes  Pediatrician      Allergies  Allergen Reactions  . Codeine Shortness Of Breath and Other (See Comments)    Can not take in liquid form but  does not recall exactly what formulation she had Has taken vicodin and percocet without problem per pt  . Peanut-Containing Drug Products Anaphylaxis  . Sulfa Antibiotics Anaphylaxis and Itching  . Robaxin [Methocarbamol] Other (See Comments)    Migraine    ROS General: Denies fever, chills, night sweats, changes in weight, changes in appetite HEENT: Denies headaches, ear pain, changes in vision, rhinorrhea, sore throat CV: Denies CP, palpitations, SOB, orthopnea Pulm: Denies SOB, cough, wheezing GI: Denies abdominal pain, nausea, vomiting, diarrhea, constipation GU: Denies dysuria, hematuria, frequency, vaginal discharge Msk: Denies muscle cramps, joint pains  + back and spine pain Neuro: Denies weakness, numbness, tingling Skin: Denies rashes, bruising Psych: Denies depression, anxiety, hallucinations      Objective:    Blood pressure 118/80, pulse 87, temperature 98 F (36.7 C), temperature source Oral, weight 176 lb 3.2 oz (79.9 kg), SpO2 98 %.  Gen. Pleasant, well-nourished, in no distress, normal affect   HEENT: [place]/AT, face symmetric, conjunctiva clear, no scleral icterus, PERRLA, EOMI, nares patent without drainage Lungs: no accessory muscle use Cardiovascular: RRR, no peripheral edema Musculoskeletal: No deformities, no cyanosis or clubbing, normal tone Neuro:  A&Ox3, CN II-XII intact, normal gait Skin:  Warm, no  lesions/ rash   Wt Readings from Last 3 Encounters:  10/20/20 172 lb 3.2 oz (78.1 kg)  07/30/20 173 lb 3.2 oz (78.6 kg)  07/07/20 172 lb 14.4 oz (78.4 kg)    Lab Results  Component Value Date   WBC 9.7 07/07/2020   HGB 13.8 07/07/2020   HCT 42.0 07/07/2020   PLT 296 07/07/2020   GLUCOSE 83 07/07/2020   CHOL 186 07/07/2020   TRIG 181 (H) 07/07/2020   HDL 34 (L) 07/07/2020   LDLCALC 122 (H) 07/07/2020   ALT 11 07/07/2020   AST 15 07/07/2020   NA 137 07/07/2020   K 4.3 07/07/2020   CL 104 07/07/2020   CREATININE 0.86 07/07/2020   BUN 9  07/07/2020   CO2 25 07/07/2020   TSH 2.90 07/07/2020   HGBA1C 5.6 09/11/2019    Assessment/Plan:  Muscle strain  -Improving -Patient advised to continue supportive care including stretching, heat, massage, NSAIDs, topical analgesic.  Also consider water aerobics and acupuncture. -Also advised to start naproxen.  Has prescription -Continue muscle relaxer.  Also previously prescribed -Discussed short prednisone taper.  Patient concerned prednisone will make her jittery. -Given precautions - Plan: predniSONE (DELTASONE) 5 MG tablet  Chiari I malformation (HCC) -Stable  F/u prn with pcp  Abbe Amsterdam, MD

## 2020-10-22 NOTE — Patient Instructions (Addendum)
A new prescription for prednisone was sent to your [place].  It is for 20 mg on days 1 and 2.  Then 15 mg on day 3.  Then 10 mg on day 4 and 5 mg on day 5.  Hopefully this will give you less of the jittery feeling.  Start the Naproxen 500 mg.    Thoracic Strain A thoracic strain, which is sometimes called a mid-back strain, is an injury to the muscles or tendons that attach to the upper part of your back behind your chest. This type of injury occurs when a muscle is overstretched or overloaded. Thoracic strains can range from mild to severe. Mild strains may involve stretching a muscle or tendon without tearing it. These injuries may heal in 1-2 weeks. More severe strains involve tearing of muscle fibers or tendons. These will cause more pain and may take 6-8 weeks to heal. What are the causes? This condition may be caused by:  Trauma, such as a fall or a hit to the body.  Twisting or overstretching the back. This may result from doing activities that require a lot of energy, such as lifting heavy objects. In some cases, the cause may not be known. What increases the risk? This injury is more common in:  Athletes.  People with obesity. What are the signs or symptoms? The main symptom of this condition is pain in the middle back, especially with movement. Other symptoms include:  Stiffness or limited range of motion.  Sudden muscle tightening (spasms). How is this diagnosed? This condition may be diagnosed based on:  Your symptoms.  Your medical history.  A physical exam.  Imaging tests, such as X-rays or an MRI. How is this treated? This condition may be treated with:  Resting the injured area.  Applying heat and cold to the injured area.  Over-the-counter medicines for pain and inflammation, such as NSAIDs.  Prescription pain medicine or muscle relaxants may be needed for a short time.  Physical therapy. This will involve doing stretching and strengthening  exercises. Follow these instructions at home: Managing pain, stiffness, and swelling      If directed, put ice on the injured area. ? Put ice in a plastic bag. ? Place a towel between your skin and the bag. ? Leave the ice on for 20 minutes, 2-3 times a day.  If directed, apply heat to the affected area as often as told by your health care provider. Use the heat source that your health care provider recommends, such as a moist heat pack or a heating pad. ? Place a towel between your skin and the heat source. ? Leave the heat on for 20-30 minutes. ? Remove the heat if your skin turns bright red. This is especially important if you are unable to feel pain, heat, or cold. You may have a greater risk of getting burned. Activity  Rest and return to your normal activities as told by your health care provider. Ask your health care provider what activities are safe for you.  Do exercises as told by your health care provider. Medicines  Take over-the-counter and prescription medicines only as told by your health care provider.  Ask your health care provider if the medicine prescribed to you: ? Requires you to avoid driving or using heavy machinery. ? Can cause constipation. You may need to take these actions to prevent or treat constipation:  Drink enough fluid to keep your urine pale yellow.  Take over-the-counter or prescription medicines.  Eat foods that are high in fiber, such as beans, whole grains, and fresh fruits and vegetables.  Limit foods that are high in fat and processed sugars, such as fried or sweet foods. Injury prevention To prevent a future mid-back injury:  Always warm up properly before physical activity or sports.  Cool down and stretch after being active.  Use correct form when playing sports and lifting heavy objects. Bend your knees before you lift heavy objects.  Use good posture when sitting and standing.  Stay physically fit and maintain a healthy  weight. ? Do at least 150 minutes of moderate-intensity exercise each week, such as brisk walking or water aerobics. ? Do strength exercises at least 2 times each week.  General instructions  Do not use any products that contain nicotine or tobacco, such as cigarettes, e-cigarettes, and chewing tobacco. If you need help quitting, ask your health care provider.  Keep all follow-up visits as told by your health care provider. This is important. Contact a health care provider if:  Your pain is not helped by medicine.  Your pain or stiffness is getting worse.  You develop pain or stiffness in your neck or lower back. Get help right away if you:  Have shortness of breath.  Have chest pain.  Develop numbness or weakness in your legs or arms.  Have involuntary loss of urine (urinary incontinence). Summary  A thoracic strain, which is sometimes called a mid-back strain, is an injury to the muscles or tendons that attach to the upper part of your back behind your chest.  This type of injury occurs when a muscle is overstretched or overloaded.  Rest and return to your normal activities as told by your health care provider. If directed, apply heat or ice to the affected area as often as told by your health care provider.  Take over-the-counter and prescription medicines only as told by your health care provider.  Contact a health care provider if you have new or worsening symptoms. This information is not intended to replace advice given to you by your health care provider. Make sure you discuss any questions you have with your health care provider. Document Revised: 10/15/2018 Document Reviewed: 10/15/2018 Elsevier Patient Education  [place] Injury Prevention Back injuries can be very painful. They can also be difficult to heal. After having one back injury, you are more likely to have another one again. It is important to learn how to avoid injuring or re-injuring  your back. The following tips can help you to prevent a back injury. What actions can I take to prevent back injuries? Changes in your diet Talk with your doctor about what to eat. Some foods can make the bones strong.  Talk with your doctor about how much calcium and vitamin D you need each day. These nutrients help to prevent weakening of the bones (osteoporosis).  Eat foods that have calcium. These include: ? Dairy products. ? Green leafy vegetables. ? Food and drinks that have had calcium added to them (fortified).  Eat foods that have vitamin D. These include: ? Milk. ? Food and drinks that have had vitamin D added to them.  Take other supplements and vitamins only as told by your doctor. Physical fitness Physical fitness makes your bones and muscles strong. It also improves your balance and strength.  Exercise for 30 minutes per day on most days of the week, or as told by your doctor. Make sure to: ? Do aerobic  exercises, such as walking, jogging, biking, or swimming. ? Do exercises that increase balance and strength, such as tai chi and yoga. ? Do stretching exercises. This helps with flexibility. ? Develop strong belly (abdominal) muscles. Your belly muscles help to support your back.  Stay at a healthy weight. This lowers your risk of a back injury. Good posture        Prevent back injuries by developing and maintaining a good posture. To do this:  Sit up straight and stand up straight. Avoid leaning forward when you sit or hunching over when you stand.  Choose chairs that have good low-back (lumbar) support.  If you work at a desk: ? Sit close to it so you do not need to lean over. ? Keep your chin tucked in. ? Keep your neck drawn back. ? Keep your elbows bent so that your arms make a corner (right angle).  When you drive: ? Sit high and close to the steering wheel. Add a low-back support to your car seat, if needed. ? Take breaks every hour if you are  driving for long periods of time.  Avoid sitting or standing in one position for very long. Take breaks to get up, stretch, and walk around at least once every hour.  Sleep on your side with your knees slightly bent, or sleep on your back with a pillow under your knees.  Lifting, twisting, and reaching   Heavy lifting ? Avoid heavy lifting, especially lifting over and over again. If you must do heavy lifting:  Stretch before lifting.  Work slowly.  Rest between lifts.  Use a tool such as a cart or a dolly to move objects if one is available.  Make several small trips instead of carrying one heavy load.  Ask for help when you need it, especially when moving big objects. ? Follow these steps when lifting:  Stand with your feet shoulder-width apart.  Get as close to the object as you can. Do not pick up a heavy object that is far from your body.  Use handles or lifting straps if they are available.  Bend at your knees. Squat down, but keep your heels off the floor.  Keep your shoulders back. Keep your chin tucked in. Keep your back straight.  Lift the object slowly while you tighten the muscles in your legs, belly, and bottom. Keep the object as close to the center of your body as possible. ? Follow these steps when putting down a heavy load:  Stand with your feet shoulder-width apart.  Lower the object slowly while you tighten the muscles in your legs, belly, and bottom. Keep the object as close to the center of your body as possible.  Keep your shoulders back. Keep your chin tucked in. Keep your back straight.  Bend at your knees. Squat down, but keep your heels off the floor.  Use handles or lifting straps if they are available.  Twisting and reaching ? Avoid lifting heavy objects above your waist. ? Do not twist at your waist while you are lifting or carrying a load. If you need to turn, move your feet. ? Do not bend over without bending at your knees. ? Avoid  reaching over your head, across a table, or for an object on a high surface. Other things to do   Avoid wet floors and icy ground. Keep sidewalks clear of ice to prevent falls.  Do not sleep on a mattress that is too soft or too  hard.  Store heavier objects on shelves at waist level.  Store lighter objects on lower or higher shelves.  Find ways to lower your stress, such as: ? Exercise. ? Massage. ? Relaxation techniques.  Talk with your doctor if you feel anxious or depressed. These conditions can make back pain worse.  Wear flat heel shoes with cushioned soles.  Use both shoulder straps when carrying a backpack.  Do not use any products that contain nicotine or tobacco, such as cigarettes and e-cigarettes. If you need help quitting, ask your doctor. Summary  Back injuries can be very painful and difficult to heal.  You can keep your back healthy by making certain changes. These include eating foods that make bones strong, working on being physically fit, developing a good posture, and lifting heavy objects in a safe way. This information is not intended to replace advice given to you by your health care provider. Make sure you discuss any questions you have with your health care provider. Document Revised: 08/20/2019 Document Reviewed: 01/18/2018 Elsevier Patient Education  [place].  Chiari Malformation  Chiari malformation (CM) is a type of brain abnormality that affects the parts of the brain called the cerebellum and the brain stem. The cerebellum is important for balance and the brain stem is important for basic body functions, such as breathing and swallowing. Normally, the cerebellum is located in a space at the back of the skull, just above the opening in the skull (foramen magnum)where the spinal cord meets the brain stem. With CM, part of the cerebellum is located below the foramen magnum instead. The malformation can be mild with no or few symptoms, or it  can be severe. CM can cause neck pain, headaches, balance problems, and other symptoms. What are the causes? CM is a condition that a person is born with (congenital). In rare cases, CM may also develop later in life (acquired CM or secondary CM). These cases may be caused by a leak of the fluid (cerebrospinal fluid) around the brain and spinal cord, leading to low pressure. In acquired or secondary CM, abnormal pressure develops in the brain. This pushes the cerebellum down into the foramen magnum. What increases the risk? The following factors may make you more likely to develop this condition:  Being female.  Having a family history of CM. What are the signs or symptoms? Symptoms of this condition may vary depending on the severity of your CM. In some cases, you may not have any symptoms. In other cases, symptoms may come and go. The most common symptom is a severe headache in the back of the head. The headache:  May come and go.  May spread to your neck and shoulders.  May be worse when you cough, sneeze, or strain. Other symptoms include:  Difficulty balancing.  Loss of coordination.  Trouble swallowing or speaking.  Muscle weakness.  Feeling dizzy.  Ringing in the ears.  Fainting.  Trouble sleeping.  Fatigue.  Tingling or burning sensations in the fingers or toes.  Hearing problems.  Vision problems.  Vomiting.  Depression.  Seizures, in severe cases. How is this diagnosed? This condition may be evaluated with a medical history and physical exam. This may include tests to check your balance and nerves (neurological exam). You may also have imaging tests, such as CT scan or MRI. How is this treated? Treatment for this condition depends on the severity of your symptoms. You may be treated with:  Surgery to prevent the malformation  from getting worse, or to treat severe symptoms or symptoms that are getting worse.  Medicines or alternative treatments to  relieve headaches or neck pain. If you do not have symptoms, you may not need treatment. Follow these instructions at home: If you have seizures:  Do not drive, swim, or do any other activities that would be dangerous if you had a seizure. Wait until your health care provider says it is safe to do them.  Avoid any substances that may prevent your medicine from working properly, such as alcohol.  Check with your local Department of Motor Vehicles [facility]) to find out about local driving laws. Each state has specific rules about when you can legally return to driving.  Get enough rest. Lack of sleep can make seizures more likely to occur. Medicines  Take over-the-counter and prescription medicines only as told by your health care provider.  Do not drive or use heavy machinery while taking prescription pain medicine.  If you are taking blood pressure or heart medicine, get up slowly and take several minutes to sit and then stand. This can reduce dizziness. General instructions  If you feel like you might faint: ? Lie down right away and raise (elevate) your feet above the level of your heart. ? Breathe deeply and steadily. Wait until all of the symptoms have passed.  Ask your health care provider which activities are safe for you, and if you have any activity restrictions.  Do not use any products that contain nicotine or tobacco, such as cigarettes and e-cigarettes. If you need help quitting, ask your health care provider.  Drink enough fluid to keep your urine pale yellow.  Consider joining a CM support group.  Keep all follow-up visits as told by your health care provider. This is important. Contact a health care provider if:  You have new symptoms.  Your symptoms get worse. Get help right away if:  You have seizures that are new or different from other seizures that you have had.  You develop weakness or numbness in one or all of your limbs.  You develop dizziness, slurred  speech, double vision, weakness, or numbness with a severe headache. Summary  A Chiari malformation is a condition in which part of the cerebellum moves through the foramen magnum.  The malformation can be mild with no symptoms, or it can be severe.  In some patients, no treatment is needed. In others, medicines are used to treat headaches. Surgery is done in the worst of cases. This information is not intended to replace advice given to you by your health care provider. Make sure you discuss any questions you have with your health care provider. Document Revised: 11/29/2017 Document Reviewed: 09/05/2017 Elsevier Patient Education  2020 [employer].

## 2020-10-25 NOTE — Telephone Encounter (Signed)
Pt was seen 11/12.

## 2020-10-27 ENCOUNTER — Ambulatory Visit: Payer: Medicare Other | Admitting: Family Medicine

## 2020-10-30 DIAGNOSIS — J020 Streptococcal pharyngitis: Secondary | ICD-10-CM | POA: Diagnosis not present

## 2020-10-30 DIAGNOSIS — R0981 Nasal congestion: Secondary | ICD-10-CM | POA: Diagnosis not present

## 2020-10-30 DIAGNOSIS — J029 Acute pharyngitis, unspecified: Secondary | ICD-10-CM | POA: Diagnosis not present

## 2020-10-30 DIAGNOSIS — R059 Cough, unspecified: Secondary | ICD-10-CM | POA: Diagnosis not present

## 2020-11-01 ENCOUNTER — Encounter: Payer: Self-pay | Admitting: Family Medicine

## 2020-11-01 ENCOUNTER — Ambulatory Visit: Payer: Medicare Other | Admitting: Family Medicine

## 2020-11-19 ENCOUNTER — Ambulatory Visit (INDEPENDENT_AMBULATORY_CARE_PROVIDER_SITE_OTHER): Payer: Medicare Other | Admitting: Family Medicine

## 2020-11-19 ENCOUNTER — Other Ambulatory Visit: Payer: Self-pay

## 2020-11-19 ENCOUNTER — Encounter: Payer: Self-pay | Admitting: Family Medicine

## 2020-11-19 VITALS — BP 118/76 | HR 97 | Temp 98.6°F | Wt 175.2 lb

## 2020-11-19 DIAGNOSIS — S161XXA Strain of muscle, fascia and tendon at neck level, initial encounter: Secondary | ICD-10-CM | POA: Diagnosis not present

## 2020-11-19 DIAGNOSIS — M436 Torticollis: Secondary | ICD-10-CM

## 2020-11-19 DIAGNOSIS — G8929 Other chronic pain: Secondary | ICD-10-CM

## 2020-11-19 MED ORDER — TIZANIDINE HCL 2 MG PO TABS: 2.0000 mg | ORAL_TABLET | Freq: Every evening | ORAL | 0 refills | Status: DC | PRN

## 2020-11-19 NOTE — Progress Notes (Signed)
Subjective:    Patient ID: Mary [name], female    DOB: 1986/04/19, 34 y.o.   MRN: [id number]  No chief complaint on file.   HPI Pt is a 34 year old female with past medical history significant for asthma, Chiari I malformation s/p repair, h/o CIN-1, depression, HPV, tobacco use, vitamin D deficiency, HLD who is followed by Dr. Ardyth Harps and seen for follow-up of ongoing concerns.  Patient endorses neck pain x1 week making it difficult to turn to the left.  Patient also endorses "pain in spine".  Patient concerned history of Chiari malformation 1 is causing her symptoms.  Patient endorses increased stress in the last few weeks 2/2 the death of a family member.  Considering acupuncture.  Has follow-up with neurology in February 2022.  Patient mentions history of anxiety and not depression as listed in her chart.  Past Medical History:  Diagnosis Date  . Asthma   . Bronchitis 01/31/2016  . Chiari malformation type I (HCC)   . Depression   . GERD (gastroesophageal reflux disease)   . H/O multiple concussions   . Headache(784.0)   . History of palpitations   . Hx of fracture of nose   . Mental disorder   . Multiple fractures    Hx: of a leg and arm fracture as a child  . Pneumonia   . PONV (postoperative nausea and vomiting)   . Post traumatic stress disorder (PTSD)   . Pregnant 01/21/2016  . PTSD (post-traumatic stress disorder)   . Sinus infection 01/31/2016  . Supervision of normal pregnancy, antepartum 02/08/2016    [facility] at             13+1 week FOB   Jakara Blatter 34 yo WM Dating By  LMP and [place] Pap  02/08/16 GC/CT Initial:                36+wks: Genetic Screen NT/IT:  CF screen  Anatomic [place]  Flu vaccine  Tdap Recommended ~ 28wks Glucose Screen  2 hr GBS  Feed Preference  Contraception  Circumcision  Childbirth Classes  Pediatrician      Allergies  Allergen Reactions  . Codeine Shortness Of Breath and Other (See Comments)    Can not take in liquid form  but does not recall exactly what formulation she had Has taken vicodin and percocet without problem per pt  . Peanut-Containing Drug Products Anaphylaxis  . Sulfa Antibiotics Anaphylaxis and Itching  . Robaxin [Methocarbamol] Other (See Comments)    Migraine    ROS General: Denies fever, chills, night sweats, changes in weight, changes in appetite HEENT: Denies headaches, ear pain, changes in vision, rhinorrhea, sore throat CV: Denies CP, palpitations, SOB, orthopnea Pulm: Denies SOB, cough, wheezing GI: Denies abdominal pain, nausea, vomiting, diarrhea, constipation GU: Denies dysuria, hematuria, frequency, vaginal discharge Msk: Denies muscle cramps, joint pains  + left neck stiffness Neuro: Denies weakness, numbness, tingling Skin: Denies rashes, bruising Psych: Denies depression, anxiety, hallucinations     Objective:    Blood pressure 118/76, pulse 97, temperature 98.6 F (37 C), temperature source Oral, weight 175 lb 3.2 oz (79.5 kg), SpO2 99 %.  Gen. Pleasant, well-nourished, in no distress, normal affect   HEENT: [place]/AT, face symmetric, conjunctiva clear, no scleral icterus, PERRLA, EOMI, nares patent without drainage Lungs: no accessory muscle use Cardiovascular: RRR, no m/r/g, no peripheral edema Musculoskeletal: Increased tone/tightness of lateral left neck at trapezius and sternocleidomastoid muscles at the insertion.  No deformities, no cyanosis  or clubbing, normal tone Neuro:  A&Ox3, CN II-XII intact, normal gait Skin:  Warm, no lesions/ rash   Wt Readings from Last 3 Encounters:  11/19/20 175 lb 3.2 oz (79.5 kg)  10/22/20 176 lb 3.2 oz (79.9 kg)  10/20/20 172 lb 3.2 oz (78.1 kg)    Lab Results  Component Value Date   WBC 9.7 07/07/2020   HGB 13.8 07/07/2020   HCT 42.0 07/07/2020   PLT 296 07/07/2020   GLUCOSE 83 07/07/2020   CHOL 186 07/07/2020   TRIG 181 (H) 07/07/2020   HDL 34 (L) 07/07/2020   LDLCALC 122 (H) 07/07/2020   ALT 11 07/07/2020   AST 15  07/07/2020   NA 137 07/07/2020   K 4.3 07/07/2020   CL 104 07/07/2020   CREATININE 0.86 07/07/2020   BUN 9 07/07/2020   CO2 25 07/07/2020   TSH 2.90 07/07/2020   HGBA1C 5.6 09/11/2019    Assessment/Plan:  Neck stiffness  -likely 2/2 increased stress.  Also consider muscle strain -Discussed supportive care including heat, massage, rest, stretching, NSAIDs as needed, and muscle relaxer -Discussed importance of self-care -Advised to consider PT and acupuncture. - Plan: tiZANidine (ZANAFLEX) 2 MG tablet  Strain of neck muscle, initial encounter -Continue supportive care as described above -Consider physical therapy  Chronic pain -More likely 2/2 increased muscle tension from stress.  Less likely 2/2 history of Chiari malformation type I s/p repair. -Advised to consider counseling, PT, acupuncture. -Also consider water aerobics -Continue follow-up with PCP and neurology for ongoing symptoms.  F/u as needed with PCP  Abbe Amsterdam, MD

## 2020-11-24 ENCOUNTER — Telehealth: Payer: Self-pay | Admitting: Neurology

## 2020-11-24 NOTE — Telephone Encounter (Signed)
Please call patient she states that she is in a lot of pain, she states that the pain has went up her back to her neck and she is unable to turn her head. She reached out to [place] about a month ago by Mychart and we asked her to follow up with PCP and she did. The PCP sent her to the ED . Please call

## 2020-11-25 NOTE — Telephone Encounter (Signed)
This seems musculoskeletal and not specifically neurologic.  She needs to follow up with her PCP or be referred to a pain specialist.

## 2020-11-25 NOTE — Telephone Encounter (Signed)
Tried calling pt, No answer. Will send a my chart message as well.

## 2020-11-26 ENCOUNTER — Telehealth: Payer: Self-pay | Admitting: Internal Medicine

## 2020-11-26 DIAGNOSIS — M436 Torticollis: Secondary | ICD-10-CM

## 2020-11-26 DIAGNOSIS — S161XXA Strain of muscle, fascia and tendon at neck level, initial encounter: Secondary | ICD-10-CM

## 2020-11-26 DIAGNOSIS — G8929 Other chronic pain: Secondary | ICD-10-CM

## 2020-11-26 NOTE — Telephone Encounter (Signed)
Spoke with patient:  Mary [name] -  This seems musculoskeletal and not specifically neurologic.  She needs to follow up with her PCP or be referred to a pain specialist.  From Neuro doctor   Okay to place an order?

## 2020-11-26 NOTE — Telephone Encounter (Signed)
Referral placed.

## 2020-11-26 NOTE — Telephone Encounter (Signed)
Ok for referral?

## 2020-11-26 NOTE — Telephone Encounter (Signed)
No information given  . Patient would like Mary [name] to give her a call [phone]

## 2020-11-30 ENCOUNTER — Ambulatory Visit: Payer: Medicare Other | Admitting: Physical Therapy

## 2020-12-01 ENCOUNTER — Encounter: Payer: Self-pay | Admitting: Physical Medicine and Rehabilitation

## 2020-12-15 ENCOUNTER — Other Ambulatory Visit: Payer: Self-pay

## 2020-12-15 ENCOUNTER — Ambulatory Visit: Payer: Medicare Other | Attending: Family Medicine | Admitting: Physical Therapy

## 2020-12-15 ENCOUNTER — Encounter: Payer: Self-pay | Admitting: Physical Therapy

## 2020-12-15 DIAGNOSIS — R293 Abnormal posture: Secondary | ICD-10-CM | POA: Diagnosis not present

## 2020-12-15 DIAGNOSIS — M542 Cervicalgia: Secondary | ICD-10-CM | POA: Diagnosis not present

## 2020-12-15 NOTE — Therapy (Signed)
[facility] [address] [place], [place], [address] Phone: [phone]   Fax:  [phone]  Physical Therapy Evaluation  Patient Details  Name: Mary [name] MRN: [id number] Date of Birth: 06/17/1986 Referring Provider (PT): Ernestina Patches   Encounter Date: 12/15/2020   PT End of Session - 12/15/20 1240    Visit Number 1    Number of Visits 12    Date for PT Re-Evaluation 03/15/21    Authorization Type PROGRESS NOTE AT 10TH VISIT.  KX MODIFIER AFTER 15 VISITS.    PT Start Time 1030    PT Stop Time 1118    PT Time Calculation (min) 48 min    Activity Tolerance Patient tolerated treatment well    Behavior During Therapy WFL for tasks assessed/performed           Past Medical History:  Diagnosis Date  . Asthma   . Bronchitis 01/31/2016  . Chiari malformation type I (HCC)   . Depression   . GERD (gastroesophageal reflux disease)   . H/O multiple concussions   . Headache(784.0)   . History of palpitations   . Hx of fracture of nose   . Mental disorder   . Multiple fractures    Hx: of a leg and arm fracture as a child  . Pneumonia   . PONV (postoperative nausea and vomiting)   . Post traumatic stress disorder (PTSD)   . Pregnant 01/21/2016  . PTSD (post-traumatic stress disorder)   . Sinus infection 01/31/2016  . Supervision of normal pregnancy, antepartum 02/08/2016    [facility] at             13+1 week FOB   Ebonye Reade 35 yo WM Dating By  LMP and [place] Pap  02/08/16 GC/CT Initial:                36+wks: Genetic Screen NT/IT:  CF screen  Anatomic [place]  Flu vaccine  Tdap Recommended ~ 28wks Glucose Screen  2 hr GBS  Feed Preference  Contraception  Circumcision  Childbirth Classes  Pediatrician      Past Surgical History:  Procedure Laterality Date  . botox injections    . colposcopy    . DILATION AND CURETTAGE OF UTERUS    . nexplanon    . SUBOCCIPITAL CRANIECTOMY CERVICAL LAMINECTOMY N/A 12/16/2018    Procedure: Cervical one Laminectomy/Chiari decompression;  Surgeon: Donalee Citrin, MD;  Location: [facility] OR;  Service: Neurosurgery;  Laterality: N/A;  . WISDOM TOOTH EXTRACTION      There were no vitals filed for this visit.    Subjective Assessment - 12/15/20 1202    Subjective COVID-19 screen performed prior to patient entering clinic.  The patient presents to the clinic today with c/o left sided neck pain.  That has been on/off since 2020.  She had a more incident in which she had severe right sided thoracic pain.  This seems to have resolved at this time.  She has not found anything that has really helped her decrease her pain.  Certain positions increase her pain.  Her pain-level today is rated at 4/10 and can become severe at times.    Pertinent History Chiari Malformation Type I and subsquent surgery (view under "Surgery" tab); PTSD.    Diagnostic tests View under "Imaging" tab.    Patient Stated Goals Get out of pain.    Currently in Pain? Yes    Pain Score 4     Pain Descriptors /  Indicators Aching    Pain Type Chronic pain    Pain Onset More than a month ago    Pain Frequency Constant    Aggravating Factors  Certain positions.    Pain Relieving Factors 'Nothing"              [facility] PT Assessment - 12/15/20 0001      Assessment   Medical Diagnosis Strain of neck muscle.    Referring Provider (PT) Shanon Banks    Onset Date/Surgical Date --   Ongoing.     Precautions   Precaution Comments C1 laminectomy      Restrictions   Weight Bearing Restrictions No      Balance Screen   Has the patient fallen in the past 6 months No    Has the patient had a decrease in activity level because of a fear of falling?  Yes    Is the patient reluctant to leave their home because of a fear of falling?  No      Home Environment   Living Environment Private residence      Prior Function   Level of Independence Independent      Posture/Postural Control   Posture/Postural Control  Postural limitations    Postural Limitations Rounded Shoulders;Forward head      Deep Tendon Reflexes   DTR Assessment Site Biceps;Brachioradialis;Triceps    Biceps DTR 0    Brachioradialis DTR 2+    Triceps DTR 1+      ROM / Strength   AROM / PROM / Strength AROM;Strength      AROM   Overall AROM Comments Bilateral active cervical rotation is 60 degrees.  Normal left UE AROM.      Strength   Overall Strength Comments Normal left elbow strength.      Palpation   Palpation comment Patients CC today is tender to palpation left of C6-C7, left UT and Levator Scapulae.                      Objective measurements completed on examination: See above findings.       [facility] Adult PT Treatment/Exercise - 12/15/20 0001      Modalities   Modalities Electrical Stimulation;Moist Heat      Moist Heat Therapy   Number Minutes Moist Heat 15 Minutes    Moist Heat Location --   Left UT.     Acupuncturist Location Left C6-7/UT.    Electrical Stimulation Action Low-level IFC    Electrical Stimulation Parameters 80-150 Hz on 40% scan x 15 minutes.    Electrical Stimulation Goals Pain;Tone                       PT Long Term Goals - 12/15/20 1244      PT LONG TERM GOAL #1   Title Independent with a HEP.    Time 6    Period Weeks    Status New      PT LONG TERM GOAL #2   Title Increase active cervical rotation to 75 degrees+ so patient can turn head more easily while driving.    Time 6    Period Weeks    Status New      PT LONG TERM GOAL #3   Title Perform ADL's with pain not > 3/10.    Time 6    Period Weeks    Status New  Plan - 12/15/20 1234    Clinical Impression Statement The patient presents to [facility] with c/o left sided neck pain that has been ongoing since 2020.  Her PMH is remarkable for a C1 laminectomy due to a Chiari Type I malformation.  Her pain can become severe at times.  She has  limited active cervical range of motion and is tender to palpation left of C6-7, her UT and levator Scapulae.Patient will benefit from skilled physical therapy intervention to address deficits and pain.    Personal Factors and Comorbidities Comorbidity 1    Comorbidities Chiari Malformation Type I and subsquent surgery (view under "Surgery" tab); PTSD.    Examination-Activity Limitations Other    Examination-Participation Restrictions Other    Stability/Clinical Decision Making Evolving/Moderate complexity    Clinical Decision Making Low    Rehab Potential Good    PT Duration 6 weeks    PT Treatment/Interventions ADLs/Self Care Home Management;Cryotherapy;Electrical Stimulation;Ultrasound;Moist Heat;Therapeutic activities;Therapeutic exercise;Manual techniques;Patient/family education           Patient will benefit from skilled therapeutic intervention in order to improve the following deficits and impairments:  Decreased activity tolerance,Increased muscle spasms  Visit Diagnosis: Cervicalgia - Plan: PT plan of care cert/re-cert  Abnormal posture - Plan: PT plan of care cert/re-cert     Problem List Patient Active Problem List   Diagnosis Date Noted  . Vitamin D deficiency 09/11/2019  . Hyperlipidemia 09/11/2019  . Status post surgery 12/16/2018  . Nonallopathic lesion of sacral region 12/02/2018  . Palpitations 07/30/2018  . Acute bilateral low back pain without sciatica 07/19/2018  . Near syncope 06/25/2018  . Chiari I malformation (HCC) 02/13/2018  . Nonallopathic lesion of cervical region 02/13/2018  . Nonallopathic lesion of thoracic region 02/13/2018  . Nonallopathic lesion of lumbosacral region 02/13/2018  . Biomechanical lesion, unspecified 02/13/2018  . Current smoker 04/20/2017  . Headache 04/20/2017  . Encounter for initial prescription of contraceptive pills 02/21/2017  . Muscle tension headache 02/21/2017  . CIN I (cervical intraepithelial neoplasia I)  06/28/2015  . Abnormal Pap smear of cervix 07/22/2014  . HPV in female 07/22/2014  . Mild dysplasia of cervix 07/22/2014  . ASCUS with positive high risk HPV 07/08/2013  . Asthma 06/04/2013  . Depression 06/04/2013    Heraclio Seidman, [place] MPT 12/15/2020, 12:49 PM  [facility] [address] [place], [place], [address] Phone: [phone]   Fax:  [phone]  Name: Yoshi Vicencio MRN: [id number] Date of Birth: 09-Jul-1986

## 2020-12-16 ENCOUNTER — Encounter (INDEPENDENT_AMBULATORY_CARE_PROVIDER_SITE_OTHER): Payer: Self-pay

## 2020-12-20 ENCOUNTER — Encounter: Payer: Self-pay | Admitting: Neurology

## 2020-12-20 ENCOUNTER — Other Ambulatory Visit: Payer: Self-pay

## 2020-12-20 ENCOUNTER — Ambulatory Visit: Payer: Medicare Other | Admitting: Physical Therapy

## 2020-12-20 DIAGNOSIS — M542 Cervicalgia: Secondary | ICD-10-CM | POA: Diagnosis not present

## 2020-12-20 DIAGNOSIS — R293 Abnormal posture: Secondary | ICD-10-CM | POA: Diagnosis not present

## 2020-12-20 NOTE — Progress Notes (Signed)
Mary [name] (Key: [id number]) Aimovig 70MG /ML auto-injectors   Form [employer] Medicare Part D Electronic Prior Authorization Form (2017 NCPDP) Created 1 day ago Sent to Plan less than a minute ago Plan Response less than a minute ago Submit Clinical Questions Determination N/A Message from Plan This medication or product was previously approved on [id number] from 2020-12-11 to 2021-12-10. **Please note: Formulary lowering, tiering exception, cost reduction and/or pre-benefit determination review (including prospective Medicare hospice reviews) requests cannot be requested using this method of submission. Please contact 2021-12-12 at [phone] instead.

## 2020-12-20 NOTE — Therapy (Signed)
[facility] [address] [place], [place], [address] Phone: [phone]   Fax:  [phone]  Physical Therapy Treatment  Patient Details  Name: Mary [name] MRN: [id number] Date of Birth: [birth year] Referring Provider (PT): Mary [name]   Encounter Date: 12/20/2020   PT End of Session - 12/20/20 1159    Visit Number 2    Number of Visits 12    Date for PT Re-Evaluation 03/15/21    Authorization Type PROGRESS NOTE AT 10TH VISIT.  KX MODIFIER AFTER 15 VISITS.    PT Start Time 1032    PT Stop Time 1117    PT Time Calculation (min) 45 min    Activity Tolerance Patient tolerated treatment well    Behavior During Therapy WFL for tasks assessed/performed           Past Medical History:  Diagnosis Date  . Asthma   . Bronchitis 01/31/2016  . Chiari malformation type I (HCC)   . Depression   . GERD (gastroesophageal reflux disease)   . H/O multiple concussions   . Headache(784.0)   . History of palpitations   . Hx of fracture of nose   . Mental disorder   . Multiple fractures    Hx: of a leg and arm fracture as a child  . Pneumonia   . PONV (postoperative nausea and vomiting)   . Post traumatic stress disorder (PTSD)   . Pregnant 01/21/2016  . PTSD (post-traumatic stress disorder)   . Sinus infection 01/31/2016  . Supervision of normal pregnancy, antepartum 02/08/2016    [facility] at             13+1 week FOB   Mary [name] 35 yo WM Dating By  LMP and [place] Pap  02/08/16 GC/CT Initial:                36+wks: Genetic Screen NT/IT:  CF screen  Anatomic [place]  Flu vaccine  Tdap Recommended ~ 28wks Glucose Screen  2 hr GBS  Feed Preference  Contraception  Circumcision  Childbirth Classes  Pediatrician      Past Surgical History:  Procedure Laterality Date  . botox injections    . colposcopy    . DILATION AND CURETTAGE OF UTERUS    . nexplanon    . SUBOCCIPITAL CRANIECTOMY CERVICAL LAMINECTOMY N/A 12/16/2018    Procedure: Cervical one Laminectomy/Chiari decompression;  Surgeon: Mary Citrin, MD;  Location: [facility] OR;  Service: Neurosurgery;  Laterality: N/A;  . WISDOM TOOTH EXTRACTION      There were no vitals filed for this visit.   Subjective Assessment - 12/20/20 1033    Subjective COVID-19 screening performed upon arrival    Pertinent History Chiari Malformation Type I and subsquent surgery (view under "Surgery" tab); PTSD.    Diagnostic tests View under "Imaging" tab.    Patient Stated Goals Get out of pain.    Currently in Pain? Yes    Pain Location Neck    Pain Orientation Right;Left    Pain Descriptors / Indicators Discomfort    Pain Type Chronic pain    Pain Onset More than a month ago    Pain Frequency Intermittent    Aggravating Factors  certain movements    Pain Relieving Factors rest and ES                             [facility] Adult PT Treatment/Exercise - 12/20/20 0001  Modalities   Modalities Electrical Stimulation;Ultrasound      Moist Heat Therapy   Number Minutes Moist Heat 15 Minutes    Moist Heat Location Cervical      Electrical Stimulation   Electrical Stimulation Location bil C6-C7/UT    Electrical Stimulation Action low level IFC    Electrical Stimulation Parameters 80-150hz  x38min    Electrical Stimulation Goals Pain;Tone      Ultrasound   Ultrasound Location bil cervical paraspinals at level C6-C7 and UT's    Ultrasound Parameters combo 1.5w/cm2/50%/59mhz x31min    Ultrasound Goals Pain      Manual Therapy   Manual Therapy Soft tissue mobilization    Manual therapy comments manual STW to bil cervical paraspinals at C6-C7 and bil UT to decrease pain and tone                       PT Long Term Goals - 12/20/20 1201      PT LONG TERM GOAL #1   Title Independent with a HEP.    Time 6    Period Weeks    Status On-going      PT LONG TERM GOAL #2   Title Increase active cervical rotation to 75 degrees+ so patient can turn  head more easily while driving.    Time 6    Period Weeks    Status On-going      PT LONG TERM GOAL #3   Title Perform ADL's with pain not > 3/10.    Time 6    Period Weeks    Status On-going                 Plan - 12/20/20 1202    Clinical Impression Statement Patient tolerated treatment well today. Patient has reported pain on bil sides today and difficulty with rotation at times. Patient reported good response after last treatment with elctrical stimulation. Today added combo and STW to reduce pain and tightness in bil cervical spine and UT. Focus at level C6-C7 and UT's today bil due to pain and tone. Goals progressing today.    Personal Factors and Comorbidities Comorbidity 1    Comorbidities Chiari Malformation Type I and subsquent surgery (view under "Surgery" tab); PTSD.    Examination-Activity Limitations Other    Examination-Participation Restrictions Other    Stability/Clinical Decision Making Evolving/Moderate complexity    Rehab Potential Good    PT Duration 6 weeks    PT Treatment/Interventions ADLs/Self Care Home Management;Cryotherapy;Electrical Stimulation;Ultrasound;Moist Heat;Therapeutic activities;Therapeutic exercise;Manual techniques;Patient/family education    PT Next Visit Plan assess and cont with POC    Consulted and Agree with Plan of Care Patient           Patient will benefit from skilled therapeutic intervention in order to improve the following deficits and impairments:  Decreased activity tolerance,Increased muscle spasms  Visit Diagnosis: Cervicalgia  Abnormal posture     Problem List Patient Active Problem List   Diagnosis Date Noted  . Vitamin D deficiency 09/11/2019  . Hyperlipidemia 09/11/2019  . Status post surgery 12/16/2018  . Nonallopathic lesion of sacral region 12/02/2018  . Palpitations 07/30/2018  . Acute bilateral low back pain without sciatica 07/19/2018  . Near syncope 06/25/2018  . Chiari I malformation (HCC)  02/13/2018  . Nonallopathic lesion of cervical region 02/13/2018  . Nonallopathic lesion of thoracic region 02/13/2018  . Nonallopathic lesion of lumbosacral region 02/13/2018  . Biomechanical lesion, unspecified 02/13/2018  . Current smoker 04/20/2017  .  Headache 04/20/2017  . Encounter for initial prescription of contraceptive pills 02/21/2017  . Muscle tension headache 02/21/2017  . CIN I (cervical intraepithelial neoplasia I) 06/28/2015  . Abnormal Pap smear of cervix 07/22/2014  . HPV in female 07/22/2014  . Mild dysplasia of cervix 07/22/2014  . ASCUS with positive high risk HPV 07/08/2013  . Asthma 06/04/2013  . Depression 06/04/2013    Hermelinda Dellen, PTA 12/20/2020, 12:08 PM  [facility] [address] [place], [place], [address] Phone: [phone]   Fax:  [phone]  Name: Mary [name] MRN: [id number] Date of Birth: October 03, 1986

## 2020-12-23 ENCOUNTER — Ambulatory Visit: Payer: Medicare Other | Admitting: Physical Therapy

## 2020-12-23 ENCOUNTER — Other Ambulatory Visit: Payer: Self-pay

## 2020-12-23 DIAGNOSIS — R293 Abnormal posture: Secondary | ICD-10-CM

## 2020-12-23 DIAGNOSIS — M542 Cervicalgia: Secondary | ICD-10-CM

## 2020-12-23 NOTE — Therapy (Signed)
[facility] [address] [place], [place], [address] Phone: [phone]   Fax:  [phone]  Physical Therapy Treatment  Patient Details  Name: Mary [name] MRN: [id number] Date of Birth: 1986-07-01 Referring Provider (PT): Ernestina Patches   Encounter Date: 12/23/2020   PT End of Session - 12/23/20 1118    Visit Number 3    Number of Visits 12    Date for PT Re-Evaluation 03/15/21    Authorization Type PROGRESS NOTE AT 10TH VISIT.  KX MODIFIER AFTER 15 VISITS.    PT Start Time 1033    PT Stop Time 1116    PT Time Calculation (min) 43 min    Activity Tolerance Patient tolerated treatment well    Behavior During Therapy WFL for tasks assessed/performed           Past Medical History:  Diagnosis Date  . Asthma   . Bronchitis 01/31/2016  . Chiari malformation type I (HCC)   . Depression   . GERD (gastroesophageal reflux disease)   . H/O multiple concussions   . Headache(784.0)   . History of palpitations   . Hx of fracture of nose   . Mental disorder   . Multiple fractures    Hx: of a leg and arm fracture as a child  . Pneumonia   . PONV (postoperative nausea and vomiting)   . Post traumatic stress disorder (PTSD)   . Pregnant 01/21/2016  . PTSD (post-traumatic stress disorder)   . Sinus infection 01/31/2016  . Supervision of normal pregnancy, antepartum 02/08/2016    [facility] at             35+1 week FOB   Mary [name] 35 yo WM Dating By  LMP and [place] Pap  02/08/16 GC/CT Initial:                36+wks: Genetic Screen NT/IT:  CF screen  Anatomic [place]  Flu vaccine  Tdap Recommended ~ 28wks Glucose Screen  2 hr GBS  Feed Preference  Contraception  Circumcision  Childbirth Classes  Pediatrician      Past Surgical History:  Procedure Laterality Date  . botox injections    . colposcopy    . DILATION AND CURETTAGE OF UTERUS    . nexplanon    . SUBOCCIPITAL CRANIECTOMY CERVICAL LAMINECTOMY N/A 12/16/2018    Procedure: Cervical one Laminectomy/Chiari decompression;  Surgeon: Donalee Citrin, MD;  Location: [facility] OR;  Service: Neurosurgery;  Laterality: N/A;  . WISDOM TOOTH EXTRACTION      There were no vitals filed for this visit.   Subjective Assessment - 12/23/20 1035    Subjective COVID-19 screening performed upon arrival. Patient reported doing well and with some ongoing discomfort    Pertinent History Chiari Malformation Type I and subsquent surgery (view under "Surgery" tab); PTSD.    Diagnostic tests View under "Imaging" tab.    Patient Stated Goals Get out of pain.    Currently in Pain? Yes    Pain Score 4     Pain Location Neck    Pain Orientation Right;Left    Pain Descriptors / Indicators Discomfort    Pain Type Chronic pain    Pain Onset More than a month ago    Pain Frequency Intermittent    Aggravating Factors  certain movements    Pain Relieving Factors rest  [facility] Adult PT Treatment/Exercise - 12/23/20 0001      Moist Heat Therapy   Number Minutes Moist Heat 15 Minutes    Moist Heat Location Cervical      Electrical Stimulation   Electrical Stimulation Location bil C6-C7/UT    Electrical Stimulation Action low level IFC    Electrical Stimulation Parameters 80-150hz  x66min    Electrical Stimulation Goals Pain;Tone      Ultrasound   Ultrasound Location bil cervical paraspinals level C6-C7    Ultrasound Parameters combo US/ES 1.5w/cm2/50%/73mhz x44min    Ultrasound Goals Pain      Manual Therapy   Manual Therapy Soft tissue mobilization    Manual therapy comments manual STW to bil cervical paraspinals at C6-C7 and bil UT to decrease pain and tone                       PT Long Term Goals - 12/20/20 1201      PT LONG TERM GOAL #1   Title Independent with a HEP.    Time 6    Period Weeks    Status On-going      PT LONG TERM GOAL #2   Title Increase active cervical rotation to 75 degrees+ so patient can turn  head more easily while driving.    Time 6    Period Weeks    Status On-going      PT LONG TERM GOAL #3   Title Perform ADL's with pain not > 3/10.    Time 6    Period Weeks    Status On-going                 Plan - 12/23/20 1127    Clinical Impression Statement Patient tolerated treatment well today. Patient continues to have some discomfort in cervical spine esp in left side today. Patient continues to respond well to modalities and STW today with palapable tighhtness in left side. Patient has ongoing limitations with rotation. Goals progresisng.    Personal Factors and Comorbidities Comorbidity 1    Comorbidities Chiari Malformation Type I and subsquent surgery (view under "Surgery" tab); PTSD.    Examination-Activity Limitations Other    Examination-Participation Restrictions Other    Stability/Clinical Decision Making Evolving/Moderate complexity    Rehab Potential Good    PT Duration 6 weeks    PT Treatment/Interventions ADLs/Self Care Home Management;Cryotherapy;Electrical Stimulation;Ultrasound;Moist Heat;Therapeutic activities;Therapeutic exercise;Manual techniques;Patient/family education    PT Next Visit Plan cont with POC    Consulted and Agree with Plan of Care Patient           Patient will benefit from skilled therapeutic intervention in order to improve the following deficits and impairments:  Decreased activity tolerance,Increased muscle spasms  Visit Diagnosis: Cervicalgia  Abnormal posture     Problem List Patient Active Problem List   Diagnosis Date Noted  . Vitamin D deficiency 09/11/2019  . Hyperlipidemia 09/11/2019  . Status post surgery 12/16/2018  . Nonallopathic lesion of sacral region 12/02/2018  . Palpitations 07/30/2018  . Acute bilateral low back pain without sciatica 07/19/2018  . Near syncope 06/25/2018  . Chiari I malformation (HCC) 02/13/2018  . Nonallopathic lesion of cervical region 02/13/2018  . Nonallopathic lesion of  thoracic region 02/13/2018  . Nonallopathic lesion of lumbosacral region 02/13/2018  . Biomechanical lesion, unspecified 02/13/2018  . Current smoker 04/20/2017  . Headache 04/20/2017  . Encounter for initial prescription of contraceptive pills 02/21/2017  . Muscle tension headache 02/21/2017  .  CIN I (cervical intraepithelial neoplasia I) 06/28/2015  . Abnormal Pap smear of cervix 07/22/2014  . HPV in female 07/22/2014  . Mild dysplasia of cervix 07/22/2014  . ASCUS with positive high risk HPV 07/08/2013  . Asthma 06/04/2013  . Depression 06/04/2013    Hermelinda Dellen, PTA 12/23/2020, 11:32 AM  [facility] [address] [place], [place], [address] Phone: [phone]   Fax:  [phone]  Name: Mary [name] MRN: [id number] Date of Birth: [birth year]

## 2020-12-28 ENCOUNTER — Ambulatory Visit: Payer: Medicare Other | Admitting: *Deleted

## 2020-12-30 ENCOUNTER — Encounter: Payer: Medicare Other | Admitting: *Deleted

## 2021-01-01 DIAGNOSIS — Z20822 Contact with and (suspected) exposure to covid-19: Secondary | ICD-10-CM | POA: Diagnosis not present

## 2021-01-04 ENCOUNTER — Ambulatory Visit: Payer: Medicare Other | Admitting: *Deleted

## 2021-01-06 ENCOUNTER — Ambulatory Visit: Payer: Medicare Other | Admitting: Physical Therapy

## 2021-01-13 ENCOUNTER — Other Ambulatory Visit: Payer: Self-pay

## 2021-01-13 ENCOUNTER — Ambulatory Visit: Payer: Medicare Other | Attending: Family Medicine | Admitting: *Deleted

## 2021-01-13 DIAGNOSIS — M542 Cervicalgia: Secondary | ICD-10-CM | POA: Diagnosis not present

## 2021-01-13 DIAGNOSIS — R293 Abnormal posture: Secondary | ICD-10-CM | POA: Insufficient documentation

## 2021-01-13 NOTE — Therapy (Signed)
[facility] [address] [place], [place], [address] Phone: [phone]   Fax:  [phone]  Physical Therapy Treatment  Patient Details  Name: Mary [name] MRN: [id number] Date of Birth: 1986/06/03 Referring Provider (PT): Ernestina Patches   Encounter Date: 01/13/2021   PT End of Session - 01/13/21 1039    Visit Number 4    Number of Visits 12    Date for PT Re-Evaluation 03/15/21    Authorization Type PROGRESS NOTE AT 10TH VISIT.  KX MODIFIER AFTER 15 VISITS.    PT Start Time 1035    PT Stop Time 1125    PT Time Calculation (min) 50 min           Past Medical History:  Diagnosis Date  . Asthma   . Bronchitis 01/31/2016  . Chiari malformation type I (HCC)   . Depression   . GERD (gastroesophageal reflux disease)   . H/O multiple concussions   . Headache(784.0)   . History of palpitations   . Hx of fracture of nose   . Mental disorder   . Multiple fractures    Hx: of a leg and arm fracture as a child  . Pneumonia   . PONV (postoperative nausea and vomiting)   . Post traumatic stress disorder (PTSD)   . Pregnant 01/21/2016  . PTSD (post-traumatic stress disorder)   . Sinus infection 01/31/2016  . Supervision of normal pregnancy, antepartum 02/08/2016    [facility] at             13+1 week FOB   Mary [name] 35 yo WM Dating By  LMP and [place] Pap  02/08/16 GC/CT Initial:                36+wks: Genetic Screen NT/IT:  CF screen  Anatomic [place]  Flu vaccine  Tdap Recommended ~ 28wks Glucose Screen  2 hr GBS  Feed Preference  Contraception  Circumcision  Childbirth Classes  Pediatrician      Past Surgical History:  Procedure Laterality Date  . botox injections    . colposcopy    . DILATION AND CURETTAGE OF UTERUS    . nexplanon    . SUBOCCIPITAL CRANIECTOMY CERVICAL LAMINECTOMY N/A 12/16/2018   Procedure: Cervical one Laminectomy/Chiari decompression;  Surgeon: Donalee Citrin, MD;  Location: [facility] OR;  Service:  Neurosurgery;  Laterality: N/A;  . WISDOM TOOTH EXTRACTION      There were no vitals filed for this visit.   Subjective Assessment - 01/13/21 1038    Subjective COVID-19 screening performed upon arrival. Patient reported doing well and with some ongoing discomfort    Pertinent History Chiari Malformation Type I and subsquent surgery (view under "Surgery" tab); PTSD.    Diagnostic tests View under "Imaging" tab.    Patient Stated Goals Get out of pain.    Currently in Pain? Yes    Pain Location Neck    Pain Orientation Right;Left    Pain Descriptors / Indicators Discomfort    Pain Type Chronic pain    Pain Onset More than a month ago                             [facility] Adult PT Treatment/Exercise - 01/13/21 0001      Modalities   Modalities Electrical Stimulation;Ultrasound      Moist Heat Therapy   Number Minutes Moist Heat 15 Minutes    Moist Heat Location  Cervical      Electrical Stimulation   Electrical Stimulation Location bil C6-C7/UT    Electrical Stimulation Action low level IFC    Electrical Stimulation Parameters 80-150hz  x 15 mins    Electrical Stimulation Goals Pain;Tone      Ultrasound   Ultrasound Location bil cerv paras and bil. UT    Ultrasound Parameters combo [place] /ES    Ultrasound Goals Pain      Manual Therapy   Manual Therapy Soft tissue mobilization    Manual therapy comments manual STW to bil cervical paraspinals at C6-C7 and bil UT to decrease pain and tone                       PT Long Term Goals - 12/20/20 1201      PT LONG TERM GOAL #1   Title Independent with a HEP.    Time 6    Period Weeks    Status On-going      PT LONG TERM GOAL #2   Title Increase active cervical rotation to 75 degrees+ so patient can turn head more easily while driving.    Time 6    Period Weeks    Status On-going      PT LONG TERM GOAL #3   Title Perform ADL's with pain not > 3/10.    Time 6    Period Weeks    Status On-going                  Plan - 01/13/21 1127    Clinical Impression Statement Pt arrived today doing fair , but still with neck and UT pain.Rx focused on Bil. UT's and into cerv paras with [place] combo and STW  f/b estim/HMP. She did well with Rx with TPR to bil. UTs and decreased pain end of session.    Personal Factors and Comorbidities Comorbidity 1    Comorbidities Chiari Malformation Type I and subsquent surgery (view under "Surgery" tab); PTSD.    PT Treatment/Interventions ADLs/Self Care Home Management;Cryotherapy;Electrical Stimulation;Ultrasound;Moist Heat;Therapeutic activities;Therapeutic exercise;Manual techniques;Patient/family education    PT Next Visit Plan cont with POC    Consulted and Agree with Plan of Care Patient           Patient will benefit from skilled therapeutic intervention in order to improve the following deficits and impairments:  Decreased activity tolerance,Increased muscle spasms  Visit Diagnosis: Cervicalgia  Abnormal posture     Problem List Patient Active Problem List   Diagnosis Date Noted  . Vitamin D deficiency 09/11/2019  . Hyperlipidemia 09/11/2019  . Status post surgery 12/16/2018  . Nonallopathic lesion of sacral region 12/02/2018  . Palpitations 07/30/2018  . Acute bilateral low back pain without sciatica 07/19/2018  . Near syncope 06/25/2018  . Chiari I malformation (HCC) 02/13/2018  . Nonallopathic lesion of cervical region 02/13/2018  . Nonallopathic lesion of thoracic region 02/13/2018  . Nonallopathic lesion of lumbosacral region 02/13/2018  . Biomechanical lesion, unspecified 02/13/2018  . Current smoker 04/20/2017  . Headache 04/20/2017  . Encounter for initial prescription of contraceptive pills 02/21/2017  . Muscle tension headache 02/21/2017  . CIN I (cervical intraepithelial neoplasia I) 06/28/2015  . Abnormal Pap smear of cervix 07/22/2014  . HPV in female 07/22/2014  . Mild dysplasia of cervix 07/22/2014  . ASCUS  with positive high risk HPV 07/08/2013  . Asthma 06/04/2013  . Depression 06/04/2013    Mary [name],CHRIS, PTA 01/13/2021, 1:29 PM  [facility] [address] [place], [place], [address] Phone: [phone]   Fax:  [phone]  Name: Mary [name] MRN: [id number] Date of Birth: 09-03-1986

## 2021-01-17 ENCOUNTER — Ambulatory Visit: Payer: Medicare Other | Admitting: Physical Therapy

## 2021-01-18 ENCOUNTER — Encounter: Payer: Medicare Other | Admitting: Physical Medicine and Rehabilitation

## 2021-01-20 ENCOUNTER — Telehealth: Payer: Self-pay | Admitting: Internal Medicine

## 2021-01-20 ENCOUNTER — Ambulatory Visit: Payer: Medicare Other | Admitting: Physical Therapy

## 2021-01-20 ENCOUNTER — Encounter: Payer: Self-pay | Admitting: Physical Therapy

## 2021-01-20 ENCOUNTER — Other Ambulatory Visit: Payer: Self-pay

## 2021-01-20 DIAGNOSIS — M542 Cervicalgia: Secondary | ICD-10-CM | POA: Diagnosis not present

## 2021-01-20 DIAGNOSIS — R293 Abnormal posture: Secondary | ICD-10-CM | POA: Diagnosis not present

## 2021-01-20 DIAGNOSIS — M436 Torticollis: Secondary | ICD-10-CM

## 2021-01-20 DIAGNOSIS — S161XXA Strain of muscle, fascia and tendon at neck level, initial encounter: Secondary | ICD-10-CM

## 2021-01-20 DIAGNOSIS — G8929 Other chronic pain: Secondary | ICD-10-CM

## 2021-01-20 NOTE — Telephone Encounter (Signed)
I believe this is for pain management.  Okay to refer?

## 2021-01-20 NOTE — Therapy (Signed)
[facility] [address] [place], [place], [address] Phone: [phone]   Fax:  [phone]  Physical Therapy Treatment  Patient Details  Name: Mary [name] MRN: [id number] Date of Birth: [birth year] Referring Provider (PT): Ernestina Patches   Encounter Date: 01/20/2021   PT End of Session - 01/20/21 1226    Visit Number 5    Number of Visits 12    Date for PT Re-Evaluation 03/15/21    Authorization Type PROGRESS NOTE AT 10TH VISIT.  KX MODIFIER AFTER 15 VISITS.    PT Start Time 1030    PT Stop Time 1120    PT Time Calculation (min) 50 min    Activity Tolerance Patient tolerated treatment well    Behavior During Therapy WFL for tasks assessed/performed           Past Medical History:  Diagnosis Date  . Asthma   . Bronchitis 01/31/2016  . Chiari malformation type I (HCC)   . Depression   . GERD (gastroesophageal reflux disease)   . H/O multiple concussions   . Headache(784.0)   . History of palpitations   . Hx of fracture of nose   . Mental disorder   . Multiple fractures    Hx: of a leg and arm fracture as a child  . Pneumonia   . PONV (postoperative nausea and vomiting)   . Post traumatic stress disorder (PTSD)   . Pregnant 01/21/2016  . PTSD (post-traumatic stress disorder)   . Sinus infection 01/31/2016  . Supervision of normal pregnancy, antepartum 02/08/2016    [facility] at             13+1 week FOB   Dahlia Nifong 35 yo WM Dating By  LMP and [place] Pap  02/08/16 GC/CT Initial:                36+wks: Genetic Screen NT/IT:  CF screen  Anatomic [place]  Flu vaccine  Tdap Recommended ~ 28wks Glucose Screen  2 hr GBS  Feed Preference  Contraception  Circumcision  Childbirth Classes  Pediatrician      Past Surgical History:  Procedure Laterality Date  . botox injections    . colposcopy    . DILATION AND CURETTAGE OF UTERUS    . nexplanon    . SUBOCCIPITAL CRANIECTOMY CERVICAL LAMINECTOMY N/A 12/16/2018    Procedure: Cervical one Laminectomy/Chiari decompression;  Surgeon: Donalee Citrin, MD;  Location: [facility] OR;  Service: Neurosurgery;  Laterality: N/A;  . WISDOM TOOTH EXTRACTION      There were no vitals filed for this visit.   Subjective Assessment - 01/20/21 1215    Subjective COVID-19 screening performed upon arrival. Patient reported some soreness after last session but doing alright. Reports more thoracic pain as well.    Pertinent History Chiari Malformation Type I and subsquent surgery (view under "Surgery" tab); PTSD.    Diagnostic tests View under "Imaging" tab.    Patient Stated Goals Get out of pain.    Currently in Pain? Yes    Pain Score 4     Pain Location Neck    Pain Orientation Left;Right    Pain Descriptors / Indicators Discomfort    Pain Type Chronic pain    Pain Onset More than a month ago    Pain Frequency Intermittent              [facility] PT Assessment - 01/20/21 0001      Assessment   Medical Diagnosis  Strain of neck muscle.    Referring Provider (PT) Shanon Banks      Precautions   Precaution Comments C1 laminectomy      Restrictions   Weight Bearing Restrictions No                         [facility] Adult PT Treatment/Exercise - 01/20/21 0001      Modalities   Modalities Electrical Stimulation;Ultrasound      Moist Heat Therapy   Number Minutes Moist Heat 10 Minutes    Moist Heat Location Cervical      Electrical Stimulation   Electrical Stimulation Location bil C6-C7/UT    Electrical Stimulation Action low level IFC    Electrical Stimulation Parameters 80-150 hz x10 mins    Electrical Stimulation Goals Pain;Tone      Ultrasound   Ultrasound Location bilateral cervical paraspina;s, UT and thoracic paraspinals    Ultrasound Parameters combo US/e-stim    Ultrasound Goals Pain      Manual Therapy   Manual Therapy Soft tissue mobilization    Manual therapy comments manual STW to bil cervical paraspinals at C6-C7 and bil UT, and  thoracic paraspinals  to decrease pain and tone                       PT Long Term Goals - 12/20/20 1201      PT LONG TERM GOAL #1   Title Independent with a HEP.    Time 6    Period Weeks    Status On-going      PT LONG TERM GOAL #2   Title Increase active cervical rotation to 75 degrees+ so patient can turn head more easily while driving.    Time 6    Period Weeks    Status On-going      PT LONG TERM GOAL #3   Title Perform ADL's with pain not > 3/10.    Time 6    Period Weeks    Status On-going                 Plan - 01/20/21 1226    Clinical Impression Statement Patient doing fairly well but with thoracic pain. Patient attempted UBE but with pain to 7-8/10. Combo performed to bilateral UTs and into thoracic paraspinals with no adverse affects. STW/M performed in prone with no adverse affects. Normal response to modalities upon removal of modalities.    Personal Factors and Comorbidities Comorbidity 1    Comorbidities Chiari Malformation Type I and subsquent surgery (view under "Surgery" tab); PTSD.    Examination-Activity Limitations Other    Examination-Participation Restrictions Other    Stability/Clinical Decision Making Evolving/Moderate complexity    Clinical Decision Making Low    Rehab Potential Good    PT Duration 6 weeks    PT Treatment/Interventions ADLs/Self Care Home Management;Cryotherapy;Electrical Stimulation;Ultrasound;Moist Heat;Therapeutic activities;Therapeutic exercise;Manual techniques;Patient/family education    PT Next Visit Plan cont with POC attempt gentle postural exercises as tolerable    Consulted and Agree with Plan of Care Patient           Patient will benefit from skilled therapeutic intervention in order to improve the following deficits and impairments:  Decreased activity tolerance,Increased muscle spasms  Visit Diagnosis: Cervicalgia  Abnormal posture     Problem List Patient Active Problem List    Diagnosis Date Noted  . Vitamin D deficiency 09/11/2019  . Hyperlipidemia 09/11/2019  . Status post surgery  12/16/2018  . Nonallopathic lesion of sacral region 12/02/2018  . Palpitations 07/30/2018  . Acute bilateral low back pain without sciatica 07/19/2018  . Near syncope 06/25/2018  . Chiari I malformation (HCC) 02/13/2018  . Nonallopathic lesion of cervical region 02/13/2018  . Nonallopathic lesion of thoracic region 02/13/2018  . Nonallopathic lesion of lumbosacral region 02/13/2018  . Biomechanical lesion, unspecified 02/13/2018  . Current smoker 04/20/2017  . Headache 04/20/2017  . Encounter for initial prescription of contraceptive pills 02/21/2017  . Muscle tension headache 02/21/2017  . CIN I (cervical intraepithelial neoplasia I) 06/28/2015  . Abnormal Pap smear of cervix 07/22/2014  . HPV in female 07/22/2014  . Mild dysplasia of cervix 07/22/2014  . ASCUS with positive high risk HPV 07/08/2013  . Asthma 06/04/2013  . Depression 06/04/2013    Guss Bunde, PT, DPT 01/20/2021, 12:36 PM  [facility] [address] [place], [place], [address] Phone: [phone]   Fax:  [phone]  Name: Mary [name] MRN: [id number] Date of Birth: June 21, 1986

## 2021-01-20 NOTE — Telephone Encounter (Signed)
Mary [name] call and stated she want a new referral to go to [facility] in [place] .

## 2021-01-21 NOTE — Telephone Encounter (Signed)
Yes

## 2021-01-21 NOTE — Telephone Encounter (Signed)
Referral placed.

## 2021-01-25 ENCOUNTER — Other Ambulatory Visit: Payer: Self-pay

## 2021-01-25 ENCOUNTER — Ambulatory Visit: Payer: Medicare Other | Admitting: Physical Therapy

## 2021-01-25 DIAGNOSIS — R293 Abnormal posture: Secondary | ICD-10-CM

## 2021-01-25 DIAGNOSIS — M542 Cervicalgia: Secondary | ICD-10-CM | POA: Diagnosis not present

## 2021-01-25 NOTE — Therapy (Signed)
[facility] [address] [place], [place], [address] Phone: [phone]   Fax:  [phone]  Physical Therapy Treatment  Patient Details  Name: Mary [name] MRN: [id number] Date of Birth: 06-26-1986 Referring Provider (PT): Ernestina Patches   Encounter Date: 01/25/2021   PT End of Session - 01/25/21 1152    Visit Number 6    Number of Visits 12    Date for PT Re-Evaluation 03/15/21    Authorization Type PROGRESS NOTE AT 10TH VISIT.  KX MODIFIER AFTER 15 VISITS.    PT Start Time 1031    PT Stop Time 1126    PT Time Calculation (min) 55 min    Activity Tolerance Patient tolerated treatment well    Behavior During Therapy WFL for tasks assessed/performed           Past Medical History:  Diagnosis Date  . Asthma   . Bronchitis 01/31/2016  . Chiari malformation type I (HCC)   . Depression   . GERD (gastroesophageal reflux disease)   . H/O multiple concussions   . Headache(784.0)   . History of palpitations   . Hx of fracture of nose   . Mental disorder   . Multiple fractures    Hx: of a leg and arm fracture as a child  . Pneumonia   . PONV (postoperative nausea and vomiting)   . Post traumatic stress disorder (PTSD)   . Pregnant 01/21/2016  . PTSD (post-traumatic stress disorder)   . Sinus infection 01/31/2016  . Supervision of normal pregnancy, antepartum 02/08/2016    [facility] at             13+1 week FOB   Tashia Leiterman 35 yo WM Dating By  LMP and [place] Pap  02/08/16 GC/CT Initial:                36+wks: Genetic Screen NT/IT:  CF screen  Anatomic [place]  Flu vaccine  Tdap Recommended ~ 28wks Glucose Screen  2 hr GBS  Feed Preference  Contraception  Circumcision  Childbirth Classes  Pediatrician      Past Surgical History:  Procedure Laterality Date  . botox injections    . colposcopy    . DILATION AND CURETTAGE OF UTERUS    . nexplanon    . SUBOCCIPITAL CRANIECTOMY CERVICAL LAMINECTOMY N/A 12/16/2018    Procedure: Cervical one Laminectomy/Chiari decompression;  Surgeon: Donalee Citrin, MD;  Location: [facility] OR;  Service: Neurosurgery;  Laterality: N/A;  . WISDOM TOOTH EXTRACTION      There were no vitals filed for this visit.   Subjective Assessment - 01/25/21 1151    Subjective COVID-19 screen performed prior to patient entering clinic.  Pain high today.  Better neck motion though.    Pertinent History Chiari Malformation Type I and subsquent surgery (view under "Surgery" tab); PTSD.    Diagnostic tests View under "Imaging" tab.    Patient Stated Goals Get out of pain.    Currently in Pain? Yes    Pain Score 7     Pain Location Neck    Pain Descriptors / Indicators Discomfort    Pain Onset More than a month ago              [facility] PT Assessment - 01/25/21 0001      AROM   Overall AROM Comments Active left cervical rotation to 73 degrees today.  [facility] Adult PT Treatment/Exercise - 01/25/21 0001      Modalities   Modalities Electrical Stimulation;Moist Heat;Ultrasound      Moist Heat Therapy   Number Minutes Moist Heat 20 Minutes    Moist Heat Location --   Upper thoracic.     Programme researcher, broadcasting/film/video Location Bilateral upper thoracic.    Electrical Stimulation Action Low level IFC 80-150 Hz    Electrical Stimulation Parameters 40%scan x 20 minutes.    Electrical Stimulation Goals Tone;Pain      Ultrasound   Ultrasound Location Left upper thoracic/mid-portion of UT.    Ultrasound Parameters [place] at 1.50 W/CM2 x 12 minutes.    Ultrasound Goals Pain      Manual Therapy   Manual Therapy Soft tissue mobilization    Manual therapy comments STW/M x 12 minutes to patient's upper thoracic region.                       PT Long Term Goals - 12/20/20 1201      PT LONG TERM GOAL #1   Title Independent with a HEP.    Time 6    Period Weeks    Status On-going      PT LONG TERM GOAL #2   Title Increase  active cervical rotation to 75 degrees+ so patient can turn head more easily while driving.    Time 6    Period Weeks    Status On-going      PT LONG TERM GOAL #3   Title Perform ADL's with pain not > 3/10.    Time 6    Period Weeks    Status On-going                 Plan - 01/25/21 1155    Clinical Impression Statement Patient reporting a 7/10 pain-level today mostly inher upper thoracic region.  Following treatment her pain was down to a 2/10.           Patient will benefit from skilled therapeutic intervention in order to improve the following deficits and impairments:     Visit Diagnosis: Cervicalgia  Abnormal posture     Problem List Patient Active Problem List   Diagnosis Date Noted  . Vitamin D deficiency 09/11/2019  . Hyperlipidemia 09/11/2019  . Status post surgery 12/16/2018  . Nonallopathic lesion of sacral region 12/02/2018  . Palpitations 07/30/2018  . Acute bilateral low back pain without sciatica 07/19/2018  . Near syncope 06/25/2018  . Chiari I malformation (HCC) 02/13/2018  . Nonallopathic lesion of cervical region 02/13/2018  . Nonallopathic lesion of thoracic region 02/13/2018  . Nonallopathic lesion of lumbosacral region 02/13/2018  . Biomechanical lesion, unspecified 02/13/2018  . Current smoker 04/20/2017  . Headache 04/20/2017  . Encounter for initial prescription of contraceptive pills 02/21/2017  . Muscle tension headache 02/21/2017  . CIN I (cervical intraepithelial neoplasia I) 06/28/2015  . Abnormal Pap smear of cervix 07/22/2014  . HPV in female 07/22/2014  . Mild dysplasia of cervix 07/22/2014  . ASCUS with positive high risk HPV 07/08/2013  . Asthma 06/04/2013  . Depression 06/04/2013    Prue Lingenfelter, [place] MPT 01/25/2021, 11:59 AM  [facility] [address] [place], [place], [address] Phone: [phone]   Fax:  [phone]  Name: Mary [name] MRN: [id number] Date of Birth:  [birth year]

## 2021-01-27 ENCOUNTER — Ambulatory Visit: Payer: Medicare Other | Admitting: *Deleted

## 2021-01-27 ENCOUNTER — Other Ambulatory Visit: Payer: Self-pay

## 2021-01-27 DIAGNOSIS — M542 Cervicalgia: Secondary | ICD-10-CM

## 2021-01-27 DIAGNOSIS — R293 Abnormal posture: Secondary | ICD-10-CM | POA: Diagnosis not present

## 2021-01-27 NOTE — Therapy (Signed)
[facility] [address] [place], [place], [address] Phone: [phone]   Fax:  [phone]  Physical Therapy Treatment  Patient Details  Name: Mary [name] MRN: [id number] Date of Birth: [birth year] Referring Provider (PT): Ernestina Patches   Encounter Date: 01/27/2021   PT End of Session - 01/27/21 1153    Visit Number 7    Number of Visits 12    Date for PT Re-Evaluation 03/15/21    Authorization Type PROGRESS NOTE AT 10TH VISIT.  KX MODIFIER AFTER 15 VISITS.    PT Start Time 1035    PT Stop Time 1121    PT Time Calculation (min) 46 min           Past Medical History:  Diagnosis Date  . Asthma   . Bronchitis 01/31/2016  . Chiari malformation type I (HCC)   . Depression   . GERD (gastroesophageal reflux disease)   . H/O multiple concussions   . Headache(784.0)   . History of palpitations   . Hx of fracture of nose   . Mental disorder   . Multiple fractures    Hx: of a leg and arm fracture as a child  . Pneumonia   . PONV (postoperative nausea and vomiting)   . Post traumatic stress disorder (PTSD)   . Pregnant 01/21/2016  . PTSD (post-traumatic stress disorder)   . Sinus infection 01/31/2016  . Supervision of normal pregnancy, antepartum 02/08/2016    [facility] at             13+1 week FOB   Mary [name] 35 yo WM Dating By  LMP and [place] Pap  02/08/16 GC/CT Initial:                36+wks: Genetic Screen NT/IT:  CF screen  Anatomic [place]  Flu vaccine  Tdap Recommended ~ 28wks Glucose Screen  2 hr GBS  Feed Preference  Contraception  Circumcision  Childbirth Classes  Pediatrician      Past Surgical History:  Procedure Laterality Date  . botox injections    . colposcopy    . DILATION AND CURETTAGE OF UTERUS    . nexplanon    . SUBOCCIPITAL CRANIECTOMY CERVICAL LAMINECTOMY N/A 12/16/2018   Procedure: Cervical one Laminectomy/Chiari decompression;  Surgeon: Donalee Citrin, MD;  Location: [facility] OR;  Service:  Neurosurgery;  Laterality: N/A;  . WISDOM TOOTH EXTRACTION      There were no vitals filed for this visit.   Subjective Assessment - 01/27/21 1041    Subjective COVID-19 screen performed prior to patient entering clinic.  Did good after last Rx    Pertinent History Chiari Malformation Type I and subsquent surgery (view under "Surgery" tab); PTSD.    Diagnostic tests View under "Imaging" tab.    Patient Stated Goals Get out of pain.    Currently in Pain? Yes    Pain Score 2     Pain Location Neck    Pain Orientation Right;Left    Pain Descriptors / Indicators Sore;Sharp    Pain Type Chronic pain    Pain Onset More than a month ago                             [facility] Adult PT Treatment/Exercise - 01/27/21 0001      Modalities   Modalities Electrical Stimulation;Moist Heat;Ultrasound      Moist Heat Therapy   Number Minutes Moist Heat  12 Minutes    Moist Heat Location --   Upper thoracic.     Programme researcher, broadcasting/film/video Location Bilateral upper thoracic.    Electrical Stimulation Action IFC 80-150hz  x 12 mins    Electrical Stimulation Parameters 40% scan x 12 mins    Electrical Stimulation Goals Tone;Pain      Ultrasound   Ultrasound Location LT and RT Lower trap/upper thoracic    Ultrasound Parameters [place] combo 1.5 w/cm2 x 12 mins    Ultrasound Goals Pain      Manual Therapy   Manual Therapy Soft tissue mobilization    Manual therapy comments STW/TPR  to patient's upper thoracic region.and Mary [name]. Scap Bil.                       PT Long Term Goals - 12/20/20 1201      PT LONG TERM GOAL #1   Title Independent with a HEP.    Time 6    Period Weeks    Status On-going      PT LONG TERM GOAL #2   Title Increase active cervical rotation to 75 degrees+ so patient can turn head more easily while driving.    Time 6    Period Weeks    Status On-going      PT LONG TERM GOAL #3   Title Perform ADL's with pain not > 3/10.     Time 6    Period Weeks    Status On-going                 Plan - 01/27/21 1301    Clinical Impression Statement Pt arrived today doing fairly well  with decreased pain. Rx performed again with good results. She  had notable TPs lev. scap Bil a swell as UTs with good release with ischemic pressure technique    Personal Factors and Comorbidities Comorbidity 1    Comorbidities Chiari Malformation Type I and subsquent surgery (view under "Surgery" tab); PTSD.    Stability/Clinical Decision Making Evolving/Moderate complexity    PT Treatment/Interventions ADLs/Self Care Home Management;Cryotherapy;Electrical Stimulation;Ultrasound;Moist Heat;Therapeutic activities;Therapeutic exercise;Manual techniques;Patient/family education    PT Next Visit Plan cont with POC attempt gentle postural exercises as tolerable    Consulted and Agree with Plan of Care Patient           Patient will benefit from skilled therapeutic intervention in order to improve the following deficits and impairments:  Decreased activity tolerance,Increased muscle spasms  Visit Diagnosis: Cervicalgia  Abnormal posture     Problem List Patient Active Problem List   Diagnosis Date Noted  . Vitamin D deficiency 09/11/2019  . Hyperlipidemia 09/11/2019  . Status post surgery 12/16/2018  . Nonallopathic lesion of sacral region 12/02/2018  . Palpitations 07/30/2018  . Acute bilateral low back pain without sciatica 07/19/2018  . Near syncope 06/25/2018  . Chiari I malformation (HCC) 02/13/2018  . Nonallopathic lesion of cervical region 02/13/2018  . Nonallopathic lesion of thoracic region 02/13/2018  . Nonallopathic lesion of lumbosacral region 02/13/2018  . Biomechanical lesion, unspecified 02/13/2018  . Current smoker 04/20/2017  . Headache 04/20/2017  . Encounter for initial prescription of contraceptive pills 02/21/2017  . Muscle tension headache 02/21/2017  . CIN I (cervical intraepithelial neoplasia I)  06/28/2015  . Abnormal Pap smear of cervix 07/22/2014  . HPV in female 07/22/2014  . Mild dysplasia of cervix 07/22/2014  . ASCUS with positive high risk HPV 07/08/2013  . Asthma 06/04/2013  .  Depression 06/04/2013    Mary Kanode,CHRIS , PTA 01/27/2021, 3:03 PM  [facility] [address] [place], [place], [address] Phone: [phone]   Fax:  [phone]  Name: Katelan Hirt MRN: [id number] Date of Birth: Oct 22, 1986
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
## 2021-01-31 ENCOUNTER — Encounter: Payer: Self-pay | Admitting: Neurology

## 2021-01-31 ENCOUNTER — Other Ambulatory Visit: Payer: Self-pay

## 2021-01-31 ENCOUNTER — Ambulatory Visit (INDEPENDENT_AMBULATORY_CARE_PROVIDER_SITE_OTHER): Payer: Medicare Other | Admitting: Neurology

## 2021-01-31 VITALS — BP 116/82 | HR 93 | Ht 64.0 in | Wt 180.0 lb

## 2021-01-31 DIAGNOSIS — G43009 Migraine without aura, not intractable, without status migrainosus: Secondary | ICD-10-CM | POA: Diagnosis not present

## 2021-01-31 DIAGNOSIS — G935 Compression of brain: Secondary | ICD-10-CM

## 2021-01-31 NOTE — Patient Instructions (Signed)
1.  Continue Aimovig 70mg  every 28 days 2.  Keep headache diary 3.  Follow up with pain management 4.  Follow up 9 months

## 2021-02-01 ENCOUNTER — Encounter: Payer: Self-pay | Admitting: Physical Therapy

## 2021-02-01 ENCOUNTER — Other Ambulatory Visit: Payer: Self-pay

## 2021-02-01 ENCOUNTER — Ambulatory Visit: Payer: Medicare Other | Admitting: Physical Therapy

## 2021-02-01 DIAGNOSIS — R293 Abnormal posture: Secondary | ICD-10-CM | POA: Diagnosis not present

## 2021-02-01 DIAGNOSIS — M542 Cervicalgia: Secondary | ICD-10-CM

## 2021-02-01 NOTE — Therapy (Signed)
[facility] [address] [place], [place], [address] Phone: [phone]   Fax:  [phone]  Physical Therapy Treatment  Patient Details  Name: Mary [name] MRN: [id number] Date of Birth: [birth year] Referring Provider (PT): Ernestina Patches   Encounter Date: 02/01/2021   PT End of Session - 02/01/21 1106    Visit Number 8    Number of Visits 12    Date for PT Re-Evaluation 03/15/21    Authorization Type PROGRESS NOTE AT 10TH VISIT.  KX MODIFIER AFTER 15 VISITS.    PT Start Time 0945    PT Stop Time 1040    PT Time Calculation (min) 55 min    Activity Tolerance Patient tolerated treatment well    Behavior During Therapy WFL for tasks assessed/performed           Past Medical History:  Diagnosis Date  . Asthma   . Bronchitis 01/31/2016  . Chiari malformation type I (HCC)   . Depression   . GERD (gastroesophageal reflux disease)   . H/O multiple concussions   . Headache(784.0)   . History of palpitations   . Hx of fracture of nose   . Mental disorder   . Multiple fractures    Hx: of a leg and arm fracture as a child  . Pneumonia   . PONV (postoperative nausea and vomiting)   . Post traumatic stress disorder (PTSD)   . Pregnant 01/21/2016  . PTSD (post-traumatic stress disorder)   . Sinus infection 01/31/2016  . Supervision of normal pregnancy, antepartum 02/08/2016    [facility] at             13+1 week FOB   [name] 35 yo WM Dating By  LMP and [place] Pap  02/08/16 GC/CT Initial:                36+wks: Genetic Screen NT/IT:  CF screen  Anatomic [place]  Flu vaccine  Tdap Recommended ~ 28wks Glucose Screen  2 hr GBS  Feed Preference  Contraception  Circumcision  Childbirth Classes  Pediatrician      Past Surgical History:  Procedure Laterality Date  . botox injections    . colposcopy    . DILATION AND CURETTAGE OF UTERUS    . nexplanon    . SUBOCCIPITAL CRANIECTOMY CERVICAL LAMINECTOMY N/A 12/16/2018    Procedure: Cervical one Laminectomy/Chiari decompression;  Surgeon: Donalee Citrin, MD;  Location: [facility] OR;  Service: Neurosurgery;  Laterality: N/A;  . WISDOM TOOTH EXTRACTION      There were no vitals filed for this visit.   Subjective Assessment - 02/01/21 1105    Subjective COVID-19 screen performed prior to patient entering clinic. Patient arrives with 4/10 pain but attributes it to the rain. Patient states ongoing stiffness and tightness in upper thoracic spine    Pertinent History Chiari Malformation Type I and subsquent surgery (view under "Surgery" tab); PTSD.    Diagnostic tests View under "Imaging" tab.    Patient Stated Goals Get out of pain.    Currently in Pain? Yes    Pain Score 4     Pain Location Neck    Pain Orientation Right;Left    Pain Descriptors / Indicators Sore              [facility] PT Assessment - 02/01/21 0001      Assessment   Medical Diagnosis Strain of neck muscle.    Referring Provider (PT) Fransisco Beau Salomon Fick  [facility] Adult PT Treatment/Exercise - 02/01/21 0001      Modalities   Modalities Electrical Stimulation;Moist Heat;Ultrasound      Moist Heat Therapy   Number Minutes Moist Heat 15 Minutes    Moist Heat Location Cervical      Electrical Stimulation   Electrical Stimulation Location Bilateral upper thoracic.    Electrical Stimulation Action IFC    Electrical Stimulation Parameters 40% scan x15 mins    Electrical Stimulation Goals Tone;Pain      Ultrasound   Ultrasound Location bilateral UT    Ultrasound Parameters Combo US/E-stim 1.5 w/cm2 x12 mins    Ultrasound Goals Pain      Manual Therapy   Manual Therapy Soft tissue mobilization    Manual therapy comments STW/M to bilateral UT and upper thoracic spine/rhomboid region to decrease tone and pain                       PT Long Term Goals - 12/20/20 1201      PT LONG TERM GOAL #1   Title Independent with a HEP.    Time 6    Period Weeks     Status On-going      PT LONG TERM GOAL #2   Title Increase active cervical rotation to 75 degrees+ so patient can turn head more easily while driving.    Time 6    Period Weeks    Status On-going      PT LONG TERM GOAL #3   Title Perform ADL's with pain not > 3/10.    Time 6    Period Weeks    Status On-going                 Plan - 02/01/21 1106    Clinical Impression Statement Patient responded well to therapy session with combo and UT STW/M. Patient requested for combo and STW/M to be focused more along UT region than cervical paraspinals as she reports more pain and stiffness after session. Patient with normal response upon removal of modalities.    Personal Factors and Comorbidities Comorbidity 1    Comorbidities Chiari Malformation Type I and subsquent surgery (view under "Surgery" tab); PTSD.    Examination-Activity Limitations Other    Examination-Participation Restrictions Other    Stability/Clinical Decision Making Evolving/Moderate complexity    Clinical Decision Making Low    Rehab Potential Good    PT Duration 6 weeks    PT Treatment/Interventions ADLs/Self Care Home Management;Cryotherapy;Electrical Stimulation;Ultrasound;Moist Heat;Therapeutic activities;Therapeutic exercise;Manual techniques;Patient/family education    PT Next Visit Plan cont with POC attempt gentle postural exercises as tolerable    Consulted and Agree with Plan of Care Patient           Patient will benefit from skilled therapeutic intervention in order to improve the following deficits and impairments:  Decreased activity tolerance,Increased muscle spasms  Visit Diagnosis: Cervicalgia  Abnormal posture     Problem List Patient Active Problem List   Diagnosis Date Noted  . Vitamin D deficiency 09/11/2019  . Hyperlipidemia 09/11/2019  . Status post surgery 12/16/2018  . Nonallopathic lesion of sacral region 12/02/2018  . Palpitations 07/30/2018  . Acute bilateral low back  pain without sciatica 07/19/2018  . Near syncope 06/25/2018  . Chiari I malformation (HCC) 02/13/2018  . Nonallopathic lesion of cervical region 02/13/2018  . Nonallopathic lesion of thoracic region 02/13/2018  . Nonallopathic lesion of lumbosacral region 02/13/2018  . Biomechanical lesion, unspecified 02/13/2018  . Current  smoker 04/20/2017  . Headache 04/20/2017  . Encounter for initial prescription of contraceptive pills 02/21/2017  . Muscle tension headache 02/21/2017  . CIN I (cervical intraepithelial neoplasia I) 06/28/2015  . Abnormal Pap smear of cervix 07/22/2014  . HPV in female 07/22/2014  . Mild dysplasia of cervix 07/22/2014  . ASCUS with positive high risk HPV 07/08/2013  . Asthma 06/04/2013  . Depression 06/04/2013    Guss Bunde, PT, DPT 02/01/2021, 11:20 AM  [facility] [address] [place], [place], [address] Phone: [phone]   Fax:  [phone]  Name: Mary [name] MRN: [id number] Date of Birth: November 09, 1986

## 2021-02-03 ENCOUNTER — Ambulatory Visit: Payer: Medicare Other | Admitting: Physical Therapy

## 2021-02-03 ENCOUNTER — Other Ambulatory Visit: Payer: Self-pay

## 2021-02-03 DIAGNOSIS — M542 Cervicalgia: Secondary | ICD-10-CM | POA: Diagnosis not present

## 2021-02-03 DIAGNOSIS — R293 Abnormal posture: Secondary | ICD-10-CM

## 2021-02-03 NOTE — Therapy (Signed)
[facility] [address] [place], [place], [address] Phone: [phone]   Fax:  [phone]  Physical Therapy Treatment  Patient Details  Name: Mary [name] MRN: [id number] Date of Birth: 18-Jan-1986 Referring Provider (PT): Ernestina Patches   Encounter Date: 02/03/2021   PT End of Session - 02/03/21 1130    Visit Number 9    Number of Visits 12    Date for PT Re-Evaluation 03/15/21    Authorization Type PROGRESS NOTE AT 10TH VISIT.  KX MODIFIER AFTER 15 VISITS.    PT Start Time 1038    PT Stop Time 1125    PT Time Calculation (min) 47 min    Activity Tolerance Patient tolerated treatment well    Behavior During Therapy WFL for tasks assessed/performed           Past Medical History:  Diagnosis Date  . Asthma   . Bronchitis 01/31/2016  . Chiari malformation type I (HCC)   . Depression   . GERD (gastroesophageal reflux disease)   . H/O multiple concussions   . Headache(784.0)   . History of palpitations   . Hx of fracture of nose   . Mental disorder   . Multiple fractures    Hx: of a leg and arm fracture as a child  . Pneumonia   . PONV (postoperative nausea and vomiting)   . Post traumatic stress disorder (PTSD)   . Pregnant 01/21/2016  . PTSD (post-traumatic stress disorder)   . Sinus infection 01/31/2016  . Supervision of normal pregnancy, antepartum 02/08/2016    [facility] at             13+1 week FOB   Mary [name] 35 yo WM Dating By  LMP and [place] Pap  02/08/16 GC/CT Initial:                36+wks: Genetic Screen NT/IT:  CF screen  Anatomic [place]  Flu vaccine  Tdap Recommended ~ 28wks Glucose Screen  2 hr GBS  Feed Preference  Contraception  Circumcision  Childbirth Classes  Pediatrician      Past Surgical History:  Procedure Laterality Date  . botox injections    . colposcopy    . DILATION AND CURETTAGE OF UTERUS    . nexplanon    . SUBOCCIPITAL CRANIECTOMY CERVICAL LAMINECTOMY N/A 12/16/2018    Procedure: Cervical one Laminectomy/Chiari decompression;  Surgeon: Donalee Citrin, MD;  Location: [facility] OR;  Service: Neurosurgery;  Laterality: N/A;  . WISDOM TOOTH EXTRACTION      There were no vitals filed for this visit.   Subjective Assessment - 02/03/21 1040    Subjective COVID-19 screen performed prior to patient entering clinic. Patient reported improvement today.    Pertinent History Chiari Malformation Type I and subsquent surgery (view under "Surgery" tab); PTSD.    Diagnostic tests View under "Imaging" tab.    Patient Stated Goals Get out of pain.    Currently in Pain? Yes    Pain Score 2     Pain Location Neck    Pain Orientation Right;Left    Pain Descriptors / Indicators Sore    Pain Type Chronic pain    Pain Onset More than a month ago    Pain Frequency Intermittent    Aggravating Factors  cervical rotation/movement    Pain Relieving Factors at rest              [facility] PT Assessment - 02/03/21 0001  AROM   AROM Assessment Site Cervical    Cervical - Right Rotation 57    Cervical - Left Rotation 73                         [facility] Adult PT Treatment/Exercise - 02/03/21 0001      Moist Heat Therapy   Number Minutes Moist Heat 15 Minutes    Moist Heat Location Cervical      Electrical Stimulation   Electrical Stimulation Location Bilateral upper thoracic.    Electrical Stimulation Action IFC    Electrical Stimulation Parameters 40% scan x60min    Electrical Stimulation Goals Tone;Pain      Ultrasound   Ultrasound Location bil UT    Ultrasound Parameters combo US/ES @1 .5/50%/54mhz x68min    Ultrasound Goals Pain      Manual Therapy   Manual Therapy Soft tissue mobilization    Manual therapy comments STW/M to bilateral UT and upper thoracic spine/rhomboid region to decrease tone and pain                       PT Long Term Goals - 02/03/21 1116      PT LONG TERM GOAL #1   Title Independent with a HEP.    Time 6    Period  Weeks    Status On-going      PT LONG TERM GOAL #2   Title Increase active cervical rotation to 75 degrees+ so patient can turn head more easily while driving.    Baseline AROM RT 57 /LT 73 cervical rotation 02/03/21    Time 6    Period Weeks    Status On-going      PT LONG TERM GOAL #3   Title Perform ADL's with pain not > 3/10.    Time 6    Period Weeks    Status On-going                 Plan - 02/03/21 1132    Clinical Impression Statement Patient tolerated treatment well today and continues to respond well to treatments. Patient has improved with cervical rotation on the left with some ongoing restrictions on the right today. Patient has palpable tightness in UT region today. Goals ongoing this week.    Personal Factors and Comorbidities Comorbidity 1    Comorbidities Chiari Malformation Type I and subsquent surgery (view under "Surgery" tab); PTSD.    Examination-Activity Limitations Other    Examination-Participation Restrictions Other    Stability/Clinical Decision Making Evolving/Moderate complexity    Rehab Potential Good    PT Duration 6 weeks    PT Treatment/Interventions ADLs/Self Care Home Management;Cryotherapy;Electrical Stimulation;Ultrasound;Moist Heat;Therapeutic activities;Therapeutic exercise;Manual techniques;Patient/family education    PT Next Visit Plan cont with POC attempt gentle postural exercises as tolerable    Consulted and Agree with Plan of Care Patient           Patient will benefit from skilled therapeutic intervention in order to improve the following deficits and impairments:  Decreased activity tolerance,Increased muscle spasms  Visit Diagnosis: Cervicalgia  Abnormal posture     Problem List Patient Active Problem List   Diagnosis Date Noted  . Vitamin D deficiency 09/11/2019  . Hyperlipidemia 09/11/2019  . Status post surgery 12/16/2018  . Nonallopathic lesion of sacral region 12/02/2018  . Palpitations 07/30/2018  .  Acute bilateral low back pain without sciatica 07/19/2018  . Near syncope 06/25/2018  . Chiari I malformation (HCC)  02/13/2018  . Nonallopathic lesion of cervical region 02/13/2018  . Nonallopathic lesion of thoracic region 02/13/2018  . Nonallopathic lesion of lumbosacral region 02/13/2018  . Biomechanical lesion, unspecified 02/13/2018  . Current smoker 04/20/2017  . Headache 04/20/2017  . Encounter for initial prescription of contraceptive pills 02/21/2017  . Muscle tension headache 02/21/2017  . CIN I (cervical intraepithelial neoplasia I) 06/28/2015  . Abnormal Pap smear of cervix 07/22/2014  . HPV in female 07/22/2014  . Mild dysplasia of cervix 07/22/2014  . ASCUS with positive high risk HPV 07/08/2013  . Asthma 06/04/2013  . Depression 06/04/2013    Hermelinda Dellen, PTA 02/03/2021, 11:37 AM  [facility] [address] [place], [place], [address] Phone: [phone]   Fax:  [phone]  Name: Mary [name] MRN: [id number] Date of Birth: Jun 15, 1986

## 2021-02-08 ENCOUNTER — Other Ambulatory Visit: Payer: Self-pay

## 2021-02-08 ENCOUNTER — Ambulatory Visit: Payer: Medicare Other | Attending: Family Medicine | Admitting: *Deleted

## 2021-02-08 DIAGNOSIS — M542 Cervicalgia: Secondary | ICD-10-CM | POA: Diagnosis not present

## 2021-02-08 DIAGNOSIS — R293 Abnormal posture: Secondary | ICD-10-CM

## 2021-02-08 NOTE — Therapy (Signed)
[facility] [address] [place], [place], [address] Phone: [phone]   Fax:  [phone]  Physical Therapy Treatment  Patient Details  Name: Mary [name] MRN: [id number] Date of Birth: Oct 04, 1986 Referring Provider (PT): Ernestina Patches   Encounter Date: 02/08/2021   PT End of Session - 02/08/21 1306    Visit Number 10    Number of Visits 12    Date for PT Re-Evaluation 03/15/21    Authorization Type PROGRESS NOTE AT 10TH VISIT.  KX MODIFIER AFTER 15 VISITS.    PT Start Time 1040    PT Stop Time 1125    PT Time Calculation (min) 45 min           Past Medical History:  Diagnosis Date  . Asthma   . Bronchitis 01/31/2016  . Chiari malformation type I (HCC)   . Depression   . GERD (gastroesophageal reflux disease)   . H/O multiple concussions   . Headache(784.0)   . History of palpitations   . Hx of fracture of nose   . Mental disorder   . Multiple fractures    Hx: of a leg and arm fracture as a child  . Pneumonia   . PONV (postoperative nausea and vomiting)   . Post traumatic stress disorder (PTSD)   . Pregnant 01/21/2016  . PTSD (post-traumatic stress disorder)   . Sinus infection 01/31/2016  . Supervision of normal pregnancy, antepartum 02/08/2016    [facility] at             13+1 week FOB   Mary [name] 35 yo WM Dating By  LMP and [place] Pap  02/08/16 GC/CT Initial:                36+wks: Genetic Screen NT/IT:  CF screen  Anatomic [place]  Flu vaccine  Tdap Recommended ~ 28wks Glucose Screen  2 hr GBS  Feed Preference  Contraception  Circumcision  Childbirth Classes  Pediatrician      Past Surgical History:  Procedure Laterality Date  . botox injections    . colposcopy    . DILATION AND CURETTAGE OF UTERUS    . nexplanon    . SUBOCCIPITAL CRANIECTOMY CERVICAL LAMINECTOMY N/A 12/16/2018   Procedure: Cervical one Laminectomy/Chiari decompression;  Surgeon: Donalee Citrin, MD;  Location: [facility] OR;  Service:  Neurosurgery;  Laterality: N/A;  . WISDOM TOOTH EXTRACTION      There were no vitals filed for this visit.   Subjective Assessment - 02/08/21 1040    Subjective COVID-19 screen performed prior to patient entering clinic. Patient reported Having a H/A today.    Pertinent History Chiari Malformation Type I and subsquent surgery (view under "Surgery" tab); PTSD.    Diagnostic tests View under "Imaging" tab.    Patient Stated Goals Get out of pain.    Currently in Pain? Yes    Pain Score 3     Pain Location Neck    Pain Orientation Right;Left    Pain Descriptors / Indicators Sore    Pain Type Chronic pain    Pain Onset More than a month ago                             [facility] Adult PT Treatment/Exercise - 02/08/21 0001      Modalities   Modalities Electrical Stimulation;Moist Heat;Ultrasound      Moist Heat Therapy   Number Minutes Moist Heat  15 Minutes    Moist Heat Location Cervical      Electrical Stimulation   Electrical Stimulation Location Bilateral upper thoracic.    Electrical Stimulation Action IFC    Electrical Stimulation Parameters 40% scan x15 mins    Electrical Stimulation Goals Tone;Pain      Ultrasound   Ultrasound Location Bil. UT/Levator    Ultrasound Parameters Combo [place] / Estim    Ultrasound Goals Pain      Manual Therapy   Manual Therapy Soft tissue mobilization    Manual therapy comments STW/M to bilateral UT and upper thoracic spine/rhomboid region to decrease tone and pain                       PT Long Term Goals - 02/03/21 1116      PT LONG TERM GOAL #1   Title Independent with a HEP.    Time 6    Period Weeks    Status On-going      PT LONG TERM GOAL #2   Title Increase active cervical rotation to 75 degrees+ so patient can turn head more easily while driving.    Baseline AROM RT 57 /LT 73 cervical rotation 02/03/21    Time 6    Period Weeks    Status On-going      PT LONG TERM GOAL #3   Title Perform  ADL's with pain not > 3/10.    Time 6    Period Weeks    Status On-going                 Plan - 02/08/21 1309    Clinical Impression Statement Pt arrived today doingbetter overall with increased ROM, but with tightness/ pain Bil UTs/Lev. due to doing a lot of walking this weekend. She did well with [place] combo f/b STW/TPR to bil. UT's and Mary [name]. Scap. Good TPR in UT's today with decreased pain end of session.    Comorbidities Chiari Malformation Type I and subsquent surgery (view under "Surgery" tab); PTSD.    Examination-Activity Limitations Other    Examination-Participation Restrictions Other    Stability/Clinical Decision Making Evolving/Moderate complexity    PT Treatment/Interventions ADLs/Self Care Home Management;Cryotherapy;Electrical Stimulation;Ultrasound;Moist Heat;Therapeutic activities;Therapeutic exercise;Manual techniques;Patient/family education    PT Next Visit Plan cont with POC attempt gentle postural exercises as tolerable    Consulted and Agree with Plan of Care Patient           Patient will benefit from skilled therapeutic intervention in order to improve the following deficits and impairments:  Decreased activity tolerance,Increased muscle spasms  Visit Diagnosis: Cervicalgia  Abnormal posture     Problem List Patient Active Problem List   Diagnosis Date Noted  . Vitamin D deficiency 09/11/2019  . Hyperlipidemia 09/11/2019  . Status post surgery 12/16/2018  . Nonallopathic lesion of sacral region 12/02/2018  . Palpitations 07/30/2018  . Acute bilateral low back pain without sciatica 07/19/2018  . Near syncope 06/25/2018  . Chiari I malformation (HCC) 02/13/2018  . Nonallopathic lesion of cervical region 02/13/2018  . Nonallopathic lesion of thoracic region 02/13/2018  . Nonallopathic lesion of lumbosacral region 02/13/2018  . Biomechanical lesion, unspecified 02/13/2018  . Current smoker 04/20/2017  . Headache 04/20/2017  . Encounter for  initial prescription of contraceptive pills 02/21/2017  . Muscle tension headache 02/21/2017  . CIN I (cervical intraepithelial neoplasia I) 06/28/2015  . Abnormal Pap smear of cervix 07/22/2014  . HPV in female 07/22/2014  . Mild  dysplasia of cervix 07/22/2014  . ASCUS with positive high risk HPV 07/08/2013  . Asthma 06/04/2013  . Depression 06/04/2013    Mary [name],CHRIS, PTA  02/08/2021, 1:18 PM  [facility] [address] [place], [place], [address] Phone: [phone]   Fax:  [phone]  Name: Mary [name] MRN: [id number] Date of Birth: 1986/10/03  Progress Note Reporting Period 12/15/20  to 02/08/21.  See note below for Objective Data and Assessment of Progress/Goals. Pleased with with progress.  Cervical range of motion has improved also.    [place] Applegate MPT

## 2021-02-10 ENCOUNTER — Encounter: Payer: Self-pay | Admitting: Internal Medicine

## 2021-02-10 ENCOUNTER — Telehealth (INDEPENDENT_AMBULATORY_CARE_PROVIDER_SITE_OTHER): Payer: Medicare Other | Admitting: Internal Medicine

## 2021-02-10 ENCOUNTER — Ambulatory Visit: Payer: Medicare Other | Admitting: *Deleted

## 2021-02-10 ENCOUNTER — Other Ambulatory Visit: Payer: Self-pay

## 2021-02-10 VITALS — Wt 180.0 lb

## 2021-02-10 DIAGNOSIS — M542 Cervicalgia: Secondary | ICD-10-CM

## 2021-02-10 DIAGNOSIS — F32A Depression, unspecified: Secondary | ICD-10-CM | POA: Diagnosis not present

## 2021-02-10 DIAGNOSIS — R293 Abnormal posture: Secondary | ICD-10-CM

## 2021-02-10 DIAGNOSIS — G44209 Tension-type headache, unspecified, not intractable: Secondary | ICD-10-CM

## 2021-02-10 DIAGNOSIS — F411 Generalized anxiety disorder: Secondary | ICD-10-CM | POA: Diagnosis not present

## 2021-02-10 MED ORDER — SERTRALINE HCL 50 MG PO TABS: 50.0000 mg | ORAL_TABLET | Freq: Every day | ORAL | 1 refills | Status: DC

## 2021-02-10 NOTE — Progress Notes (Signed)
Virtual Visit via Video Note  I connected with Mary [name] on 02/10/21 at  4:00 PM EST by a video enabled telemedicine application and verified that I am speaking with the correct person using two identifiers.  Location patient: home Location provider: work office Persons participating in the virtual visit: patient, provider  I discussed the limitations of evaluation and management by telemedicine and the availability of in person appointments. The patient expressed understanding and agreed to proceed.   HPI: She has scheduled this visit for 2 reasons:  1.  Recently she has had significant increase in anxiety and depression.  She is having a lot of physical manifestations of anxiety.  She thought about going to urgent care due to chest pain but she was able to "talk herself out of it".  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  On top of that they have had a house quarantine due to a positive Covid case and her being in the house has increased her anxiety symptoms.  She has already reached out to pain management and to psychotherapy, they advised her to reach out to me for medication management while they focus on CBT sessions.  She is visibly tearful throughout our exam today.  She denies suicidal intent.  2.  She has been seeing physical therapy for stress and tension headaches.  Her therapy has run out she is wondering if I would resend a referral since she feels like this has worked really well for her.   ROS: Constitutional: Denies fever, chills, diaphoresis, appetite change.  HEENT: Denies photophobia, eye pain, redness, hearing loss, ear pain, congestion, sore throat, rhinorrhea, sneezing, mouth sores, trouble swallowing, neck pain, neck stiffness and tinnitus.   Respiratory: Denies SOB, DOE, cough, chest tightness,  and wheezing.   Cardiovascular: Denies   palpitations and leg swelling.  Gastrointestinal: Denies nausea, vomiting, abdominal pain, diarrhea, constipation, blood in stool and abdominal distention.  Genitourinary: Denies dysuria, urgency, frequency, hematuria, flank pain and difficulty urinating.  Endocrine: Denies: hot or cold intolerance, sweats, changes in hair or nails, polyuria, polydipsia. Musculoskeletal: Denies myalgias, back pain, joint swelling, arthralgias and gait problem.  Skin: Denies pallor, rash and wound.  Neurological: Denies dizziness, seizures, syncope, weakness, light-headedness, numbness and headaches.  Hematological: Denies adenopathy. Easy bruising, personal or family bleeding history  Psychiatric/Behavioral: Denies suicidal ideation.   Past Medical History:  Diagnosis Date  . Asthma   . Bronchitis 01/31/2016  . Chiari malformation type I (HCC)   . Depression   . GERD (gastroesophageal reflux disease)   . H/O multiple concussions   . Headache(784.0)   . History of palpitations   . Hx of fracture of nose   . Mental disorder   . Multiple fractures    Hx: of a leg and arm fracture as a child  . Pneumonia   . PONV (postoperative nausea and vomiting)   . Post traumatic stress disorder (PTSD)   . Pregnant 01/21/2016  . PTSD (post-traumatic stress disorder)   . Sinus infection 01/31/2016  . Supervision of normal pregnancy, antepartum 02/08/2016    [facility] at             13+1 week FOB   Caryssa Elzey 35 yo WM Dating By  LMP and [place] Pap  02/08/16 GC/CT Initial:  36+wks: Genetic Screen NT/IT:  CF screen  Anatomic [place]  Flu vaccine  Tdap Recommended ~ 28wks Glucose Screen  2 hr GBS  Feed Preference  Contraception  Circumcision  Childbirth Classes  Pediatrician      Past Surgical History:  Procedure Laterality Date  . botox injections    . colposcopy    . DILATION AND CURETTAGE OF UTERUS    . nexplanon    . SUBOCCIPITAL CRANIECTOMY CERVICAL LAMINECTOMY N/A 12/16/2018    Procedure: Cervical one Laminectomy/Chiari decompression;  Surgeon: Donalee Citrin, MD;  Location: [facility] OR;  Service: Neurosurgery;  Laterality: N/A;  . WISDOM TOOTH EXTRACTION      Family History  Problem Relation Age of Onset  . Cancer Mother 103       breast  . Hypertension Mother   . Depression Mother   . Depression Father   . Seizures Father   . Cancer Maternal Grandmother        breast and cervical  . Asthma Son   . Asthma Son   . Drug abuse Paternal Grandmother   . Alcohol abuse Paternal Grandmother   . Hypertension Other   . Cancer Other     SOCIAL HX:   reports that she has been smoking cigarettes. She has been smoking about 0.50 packs per day. She has never used smokeless tobacco. She reports current alcohol use. She reports that she does not use drugs.   Current Outpatient Medications:  .  albuterol (VENTOLIN HFA) 108 (90 Base) MCG/ACT inhaler, Inhale 2 puffs into the lungs every 6 (six) hours as needed for wheezing or shortness of breath., Disp: 1 g, Rfl: 2 .  BIOTIN PO, Take by mouth., Disp: , Rfl:  .  Cholecalciferol (VITAMIN D3) 1.25 MG (50000 UT) CAPS, One by mouth once per week., Disp: 12 capsule, Rfl: 3 .  Erenumab-aooe (AIMOVIG) 70 MG/ML SOAJ, Inject 70 mg into the skin every 28 (twenty-eight) days., Disp: 1 pen, Rfl: 11 .  fluticasone (FLONASE) 50 MCG/ACT nasal spray, Place 2 sprays into both nostrils daily., Disp: , Rfl:  .  montelukast (SINGULAIR) 10 MG tablet, Take 1 tablet (10 mg total) by mouth at bedtime., Disp: 30 tablet, Rfl: 1 .  naproxen (NAPROSYN) 500 MG tablet, Take 1 tablet (500 mg total) by mouth 2 (two) times daily with a meal., Disp: 60 tablet, Rfl: 1 .  predniSONE (DELTASONE) 5 MG tablet, Take 4 tabs in am on Day 1 and 2.  Take 3 tabs on day 3.  Take 2 tabs on day 4.  Take 1 tab on day 5. (Patient taking differently: Take 4 tabs in am on Day 1 and 2.  Take 3 tabs on day 3.  Take 2 tabs on day 4.  Take 1 tab on day 5.), Disp: 14 tablet, Rfl: 0 .   rizatriptan (MAXALT) 10 MG tablet, Take 1 tablet earliest onset of migraine.  May repeat in 2 hours if needed.  Maximum 2 tablets in 24 hours, Disp: 10 tablet, Rfl: 3 .  sertraline (ZOLOFT) 50 MG tablet, Take 1 tablet (50 mg total) by mouth daily., Disp: 90 tablet, Rfl: 1 .  tiZANidine (ZANAFLEX) 2 MG tablet, Take 1 tablet (2 mg total) by mouth at bedtime as needed for muscle spasms., Disp: 20 tablet, Rfl: 0 .  triamcinolone cream (KENALOG) 0.1 %, Apply 1 application topically 2 (two) times daily., Disp: 30 g, Rfl: 0  EXAM:   VITALS per patient if applicable: None reported  GENERAL: alert,  oriented, appears well and in no acute distress although visibly tearful.  HEENT: atraumatic, conjunttiva clear, no obvious abnormalities on inspection of external nose and ears  NECK: normal movements of the head and neck  LUNGS: on inspection no signs of respiratory distress, breathing rate appears normal, no obvious gross increased work of breathing, gasping or wheezing  CV: no obvious cyanosis  MS: moves all visible extremities without noticeable abnormality  PSYCH/NEURO: pleasant and cooperative, speech and thought processing grossly intact, visibly tearful and anxious  ASSESSMENT AND PLAN:   Depression, unspecified depression type GAD (generalized anxiety disorder)  -She will be starting CBT sessions soon. -Will start her on Zoloft 50 mg to take at bedtime with follow-up in 8 weeks.  Muscle tension headache  - Plan: Ambulatory referral to Physical Therapy     I discussed the assessment and treatment plan with the patient. The patient was provided an opportunity to ask questions and all were answered. The patient agreed with the plan and demonstrated an understanding of the instructions.   The patient was advised to call back or seek an in-person evaluation if the symptoms worsen or if the condition fails to improve as anticipated.    Chaya Jan, MD  [facility]  at [facility]

## 2021-02-10 NOTE — Therapy (Signed)
[facility] [address] [place], [place], [address] Phone: [phone]   Fax:  [phone]  Physical Therapy Treatment  Patient Details  Name: Mary [name] MRN: [id number] Date of Birth: July 01, 1986 Referring Provider (PT): Ernestina Patches   Encounter Date: 02/10/2021   PT End of Session - 02/10/21 1123    Visit Number 11    Number of Visits 12    Date for PT Re-Evaluation 03/15/21    Authorization Type PROGRESS NOTE AT 10TH VISIT.  KX MODIFIER AFTER 15 VISITS.    PT Start Time 1038    PT Stop Time 1124    PT Time Calculation (min) 46 min           Past Medical History:  Diagnosis Date  . Asthma   . Bronchitis 01/31/2016  . Chiari malformation type I (HCC)   . Depression   . GERD (gastroesophageal reflux disease)   . H/O multiple concussions   . Headache(784.0)   . History of palpitations   . Hx of fracture of nose   . Mental disorder   . Multiple fractures    Hx: of a leg and arm fracture as a child  . Pneumonia   . PONV (postoperative nausea and vomiting)   . Post traumatic stress disorder (PTSD)   . Pregnant 01/21/2016  . PTSD (post-traumatic stress disorder)   . Sinus infection 01/31/2016  . Supervision of normal pregnancy, antepartum 02/08/2016    [facility] at             13+1 week FOB   Mary [name] 35 yo WM Dating By  LMP and [place] Pap  02/08/16 GC/CT Initial:                36+wks: Genetic Screen NT/IT:  CF screen  Anatomic [place]  Flu vaccine  Tdap Recommended ~ 28wks Glucose Screen  2 hr GBS  Feed Preference  Contraception  Circumcision  Childbirth Classes  Pediatrician      Past Surgical History:  Procedure Laterality Date  . botox injections    . colposcopy    . DILATION AND CURETTAGE OF UTERUS    . nexplanon    . SUBOCCIPITAL CRANIECTOMY CERVICAL LAMINECTOMY N/A 12/16/2018   Procedure: Cervical one Laminectomy/Chiari decompression;  Surgeon: Donalee Citrin, MD;  Location: [facility] OR;  Service:  Neurosurgery;  Laterality: N/A;  . WISDOM TOOTH EXTRACTION      There were no vitals filed for this visit.   Subjective Assessment - 02/10/21 1122    Subjective COVID-19 screen performed prior to patient entering clinic. Patient reported Having a H/A today.    Pertinent History Chiari Malformation Type I and subsquent surgery (view under "Surgery" tab); PTSD.    Diagnostic tests View under "Imaging" tab.    Patient Stated Goals Get out of pain.    Currently in Pain? Yes    Pain Score 3     Pain Location Neck    Pain Orientation Right;Left    Pain Descriptors / Indicators Sore    Pain Type Chronic pain    Pain Onset More than a month ago                             [facility] Adult PT Treatment/Exercise - 02/10/21 0001      Modalities   Modalities Electrical Stimulation;Moist Heat;Ultrasound      Moist Heat Therapy   Number Minutes Moist Heat  15 Minutes    Moist Heat Location Cervical      Electrical Stimulation   Electrical Stimulation Location Bilateral upper thoracic.    Electrical Stimulation Action IFC    Electrical Stimulation Parameters 40% scan x 15 mins    Electrical Stimulation Goals Tone;Pain      Ultrasound   Ultrasound Location Bil UT/ Levator    Ultrasound Parameters Combo [place] 1.5 w/cm2 x 10 mins    Ultrasound Goals Pain      Manual Therapy   Manual Therapy Soft tissue mobilization    Manual therapy comments STW/ TPR  to bilateral UT and Levator to decrease tone and pain                       PT Long Term Goals - 02/10/21 1124      PT LONG TERM GOAL #1   Title Independent with a HEP.    Time 6    Period Weeks    Status On-going      PT LONG TERM GOAL #2   Title Increase active cervical rotation to 75 degrees+ so patient can turn head more easily while driving.    Baseline AROM RT 60 /LT 75 cervical rotation 02/10/21    Time 6    Period Weeks    Status On-going      PT LONG TERM GOAL #4   Title Pt will reported  decreased overall pain to 3/10 or < on a daily basis to allow her to perform ADLs and IADLs with greater ease.    Baseline 6/28: reports minimal pain every now and then but overall reports significant improvement    Time 4    Period Weeks    Status Achieved                 Plan - 02/10/21 1316    Clinical Impression Statement Pt arrived today doing better with decreased pain and TPs. She did well with Rx with notable TP's in Bil UT's and had good release. Pt's cervical rotation ROM has improved to LT 75 degrees and RT 60 degrees.    Comorbidities Chiari Malformation Type I and subsquent surgery (view under "Surgery" tab); PTSD.    Examination-Activity Limitations Other    PT Duration 6 weeks    PT Treatment/Interventions ADLs/Self Care Home Management;Cryotherapy;Electrical Stimulation;Ultrasound;Moist Heat;Therapeutic activities;Therapeutic exercise;Manual techniques;Patient/family education    PT Next Visit Plan cont with POC attempt gentle postural exercises as tolerable. MD note next visit    Consulted and Agree with Plan of Care Patient           Patient will benefit from skilled therapeutic intervention in order to improve the following deficits and impairments:  Decreased activity tolerance,Increased muscle spasms  Visit Diagnosis: Cervicalgia  Abnormal posture     Problem List Patient Active Problem List   Diagnosis Date Noted  . Vitamin D deficiency 09/11/2019  . Hyperlipidemia 09/11/2019  . Status post surgery 12/16/2018  . Nonallopathic lesion of sacral region 12/02/2018  . Palpitations 07/30/2018  . Acute bilateral low back pain without sciatica 07/19/2018  . Near syncope 06/25/2018  . Chiari I malformation (HCC) 02/13/2018  . Nonallopathic lesion of cervical region 02/13/2018  . Nonallopathic lesion of thoracic region 02/13/2018  . Nonallopathic lesion of lumbosacral region 02/13/2018  . Biomechanical lesion, unspecified 02/13/2018  . Current smoker  04/20/2017  . Headache 04/20/2017  . Encounter for initial prescription of contraceptive pills 02/21/2017  . Muscle tension headache  02/21/2017  . CIN I (cervical intraepithelial neoplasia I) 06/28/2015  . Abnormal Pap smear of cervix 07/22/2014  . HPV in female 07/22/2014  . Mild dysplasia of cervix 07/22/2014  . ASCUS with positive high risk HPV 07/08/2013  . Asthma 06/04/2013  . Depression 06/04/2013    Matison Nuccio,CHRIS, PTA 02/10/2021, 1:28 PM  [facility] [address] [place], [place], [address] Phone: [phone]   Fax:  [phone]  Name: Mary [name] MRN: [id number] Date of Birth: 06/11/1986

## 2021-02-11 ENCOUNTER — Ambulatory Visit: Payer: Medicare Other | Admitting: Internal Medicine

## 2021-02-15 ENCOUNTER — Other Ambulatory Visit: Payer: Self-pay

## 2021-02-15 ENCOUNTER — Ambulatory Visit: Payer: Medicare Other | Admitting: Physical Therapy

## 2021-02-15 DIAGNOSIS — R293 Abnormal posture: Secondary | ICD-10-CM

## 2021-02-15 DIAGNOSIS — M542 Cervicalgia: Secondary | ICD-10-CM

## 2021-02-15 NOTE — Therapy (Signed)
[facility] [address] [place], [place], [address] Phone: [phone]   Fax:  [phone]  Physical Therapy Treatment  Patient Details  Name: Mary [name] MRN: [id number] Date of Birth: November 19, 1986 Referring Provider (PT): Ernestina Patches   Encounter Date: 02/15/2021   PT End of Session - 02/15/21 1254    Visit Number 12    Number of Visits 20   per new order   Date for PT Re-Evaluation 04/05/21    Authorization Type PROGRESS NOTE AT 10TH VISIT.  KX MODIFIER AFTER 15 VISITS.    PT Start Time 1030    PT Stop Time 1119    PT Time Calculation (min) 49 min    Activity Tolerance Patient tolerated treatment well    Behavior During Therapy WFL for tasks assessed/performed           Past Medical History:  Diagnosis Date  . Asthma   . Bronchitis 01/31/2016  . Chiari malformation type I (HCC)   . Depression   . GERD (gastroesophageal reflux disease)   . H/O multiple concussions   . Headache(784.0)   . History of palpitations   . Hx of fracture of nose   . Mental disorder   . Multiple fractures    Hx: of a leg and arm fracture as a child  . Pneumonia   . PONV (postoperative nausea and vomiting)   . Post traumatic stress disorder (PTSD)   . Pregnant 01/21/2016  . PTSD (post-traumatic stress disorder)   . Sinus infection 01/31/2016  . Supervision of normal pregnancy, antepartum 02/08/2016    [facility] at             13+1 week FOB   Artelia Game 35 yo WM Dating By  LMP and [place] Pap  02/08/16 GC/CT Initial:                36+wks: Genetic Screen NT/IT:  CF screen  Anatomic [place]  Flu vaccine  Tdap Recommended ~ 28wks Glucose Screen  2 hr GBS  Feed Preference  Contraception  Circumcision  Childbirth Classes  Pediatrician      Past Surgical History:  Procedure Laterality Date  . botox injections    . colposcopy    . DILATION AND CURETTAGE OF UTERUS    . nexplanon    . SUBOCCIPITAL CRANIECTOMY CERVICAL LAMINECTOMY N/A  12/16/2018   Procedure: Cervical one Laminectomy/Chiari decompression;  Surgeon: Donalee Citrin, MD;  Location: [facility] OR;  Service: Neurosurgery;  Laterality: N/A;  . WISDOM TOOTH EXTRACTION      There were no vitals filed for this visit.   Subjective Assessment - 02/15/21 1439    Subjective COVID-19 screen performed prior to patient entering clinic. Patient reported dealing with a lot of stress lately but stated she feels her UTs release easier now.    Pertinent History Chiari Malformation Type I and subsquent surgery (view under "Surgery" tab); PTSD.    Diagnostic tests View under "Imaging" tab.    Patient Stated Goals Get out of pain.    Currently in Pain? Yes   did not provide number on pain scale             [facility] PT Assessment - 02/15/21 0001      Assessment   Medical Diagnosis Strain of neck muscle.    Referring Provider (PT) Ernestina Patches    Next MD Visit no follow up  [facility] Adult PT Treatment/Exercise - 02/15/21 0001      Modalities   Modalities Electrical Stimulation;Moist Heat;Ultrasound      Moist Heat Therapy   Number Minutes Moist Heat 15 Minutes    Moist Heat Location Cervical      Electrical Stimulation   Electrical Stimulation Location Bilateral upper thoracic.    Electrical Stimulation Action IFC    Electrical Stimulation Parameters 40% scan x15 mins    Electrical Stimulation Goals Tone;Pain      Ultrasound   Ultrasound Location bilateral UTs and levator    Ultrasound Parameters combo US/E-stim 1.5 w/cm2 100% x10 mins    Ultrasound Goals Pain      Manual Therapy   Manual Therapy Soft tissue mobilization    Manual therapy comments STW/ TPR  to bilateral UT and Levator to decrease tone and pain                       PT Long Term Goals - 02/15/21 1435      PT LONG TERM GOAL #1   Title Independent with a HEP.    Time 6    Period Weeks    Status On-going      PT LONG TERM GOAL #2   Title Increase  active cervical rotation to 75 degrees+ so patient can turn head more easily while driving.    Baseline AROM RT 60 /LT 75 cervical rotation 02/10/21    Time 6    Period Weeks    Status On-going      PT LONG TERM GOAL #3   Title Perform ADL's with pain not > 3/10.    Time 6    Period Weeks    Status On-going      PT LONG TERM GOAL #4   Title --    Time --    Period --    Status --                 Plan - 02/15/21 1300    Clinical Impression Statement Patient responded well to therapy session with decreased trigger points and more relief with tension. Patient provided with combo US/E-Stim and STW/M to bilateral UTs with good release of tension. Patient with notable tone in bilat UTs at start of session. 8 visits added to plan of care with emphasis on more strengthening and stability of shoulders and neck musculature. No adverse affects upon removal of modalities.    Personal Factors and Comorbidities Comorbidity 1    Comorbidities Chiari Malformation Type I and subsquent surgery (view under "Surgery" tab); PTSD.    Examination-Activity Limitations Other    Examination-Participation Restrictions Other    Stability/Clinical Decision Making Evolving/Moderate complexity    Clinical Decision Making Low    Rehab Potential Good    PT Duration 6 weeks    PT Treatment/Interventions ADLs/Self Care Home Management;Cryotherapy;Electrical Stimulation;Ultrasound;Moist Heat;Therapeutic activities;Therapeutic exercise;Manual techniques;Patient/family education    PT Next Visit Plan progress to gentle postural and strengthening TEs to tolerance. Modalities as needed for pain relief.    Consulted and Agree with Plan of Care Patient           Patient will benefit from skilled therapeutic intervention in order to improve the following deficits and impairments:  Decreased activity tolerance,Increased muscle spasms  Visit Diagnosis: Cervicalgia  Abnormal posture     Problem List Patient  Active Problem List   Diagnosis Date Noted  . Vitamin D deficiency 09/11/2019  . Hyperlipidemia 09/11/2019  .  Status post surgery 12/16/2018  . Nonallopathic lesion of sacral region 12/02/2018  . Palpitations 07/30/2018  . Acute bilateral low back pain without sciatica 07/19/2018  . Near syncope 06/25/2018  . Chiari I malformation (HCC) 02/13/2018  . Nonallopathic lesion of cervical region 02/13/2018  . Nonallopathic lesion of thoracic region 02/13/2018  . Nonallopathic lesion of lumbosacral region 02/13/2018  . Biomechanical lesion, unspecified 02/13/2018  . Current smoker 04/20/2017  . Headache 04/20/2017  . Encounter for initial prescription of contraceptive pills 02/21/2017  . Muscle tension headache 02/21/2017  . CIN I (cervical intraepithelial neoplasia I) 06/28/2015  . Abnormal Pap smear of cervix 07/22/2014  . HPV in female 07/22/2014  . Mild dysplasia of cervix 07/22/2014  . ASCUS with positive high risk HPV 07/08/2013  . Asthma 06/04/2013  . Depression 06/04/2013    Guss Bunde 02/15/2021, 2:40 PM  [facility] [address] [place], [place], [address] Phone: [phone]   Fax:  [phone]  Name: Mary [name] MRN: [id number] Date of Birth: Oct 02, 1986

## 2021-02-16 ENCOUNTER — Ambulatory Visit: Payer: Medicare Other | Admitting: Internal Medicine

## 2021-02-17 ENCOUNTER — Ambulatory Visit: Payer: Medicare Other | Admitting: Internal Medicine

## 2021-02-22 ENCOUNTER — Ambulatory Visit: Payer: Medicare Other | Admitting: Physical Therapy

## 2021-02-23 ENCOUNTER — Encounter
Payer: Medicare Other | Attending: Physical Medicine and Rehabilitation | Admitting: Physical Medicine and Rehabilitation

## 2021-02-24 ENCOUNTER — Encounter: Payer: Medicare Other | Admitting: *Deleted

## 2021-03-01 ENCOUNTER — Ambulatory Visit: Payer: Medicare Other | Admitting: *Deleted

## 2021-03-03 ENCOUNTER — Encounter: Payer: Medicare Other | Admitting: *Deleted

## 2021-03-04 ENCOUNTER — Other Ambulatory Visit: Payer: Self-pay

## 2021-03-04 ENCOUNTER — Ambulatory Visit: Payer: Medicare Other | Admitting: Physical Therapy

## 2021-03-04 DIAGNOSIS — R293 Abnormal posture: Secondary | ICD-10-CM

## 2021-03-04 DIAGNOSIS — M542 Cervicalgia: Secondary | ICD-10-CM | POA: Diagnosis not present

## 2021-03-04 NOTE — Therapy (Signed)
[facility] [address] [place], [place], [address] Phone: [phone]   Fax:  [phone]  Physical Therapy Treatment  Patient Details  Name: Mary [name] MRN: [id number] Date of Birth: 01-Sep-1986 Referring Provider (PT): Ernestina Patches   Encounter Date: 03/04/2021   PT End of Session - 03/04/21 1337    Visit Number 13    Number of Visits 20    Date for PT Re-Evaluation 04/05/21    Authorization Type PROGRESS NOTE AT 10TH VISIT.  KX MODIFIER AFTER 15 VISITS.    PT Start Time 1030    PT Stop Time 1127    PT Time Calculation (min) 57 min    Activity Tolerance Patient tolerated treatment well    Behavior During Therapy WFL for tasks assessed/performed           Past Medical History:  Diagnosis Date  . Asthma   . Bronchitis 01/31/2016  . Chiari malformation type I (HCC)   . Depression   . GERD (gastroesophageal reflux disease)   . H/O multiple concussions   . Headache(784.0)   . History of palpitations   . Hx of fracture of nose   . Mental disorder   . Multiple fractures    Hx: of a leg and arm fracture as a child  . Pneumonia   . PONV (postoperative nausea and vomiting)   . Post traumatic stress disorder (PTSD)   . Pregnant 01/21/2016  . PTSD (post-traumatic stress disorder)   . Sinus infection 01/31/2016  . Supervision of normal pregnancy, antepartum 02/08/2016    [facility] at             13+1 week FOB   Sona Nations 35 yo WM Dating By  LMP and [place] Pap  02/08/16 GC/CT Initial:                36+wks: Genetic Screen NT/IT:  CF screen  Anatomic [place]  Flu vaccine  Tdap Recommended ~ 28wks Glucose Screen  2 hr GBS  Feed Preference  Contraception  Circumcision  Childbirth Classes  Pediatrician      Past Surgical History:  Procedure Laterality Date  . botox injections    . colposcopy    . DILATION AND CURETTAGE OF UTERUS    . nexplanon    . SUBOCCIPITAL CRANIECTOMY CERVICAL LAMINECTOMY N/A 12/16/2018    Procedure: Cervical one Laminectomy/Chiari decompression;  Surgeon: Donalee Citrin, MD;  [facility];  Service: Neurosurgery;  Laterality: N/A;  . WISDOM TOOTH EXTRACTION      There were no vitals filed for this visit.   Subjective Assessment - 03/04/21 1326    Subjective COVID-19 screen performed prior to patient entering clinic.  Feeling some better.    Pertinent History Chiari Malformation Type I and subsquent surgery (view under "Surgery" tab); PTSD.    Diagnostic tests View under "Imaging" tab.    Currently in Pain? Yes    Pain Score 3     Pain Location Neck    Pain Orientation Right;Left                             [facility] Adult PT Treatment/Exercise - 03/04/21 0001      Exercises   Exercises Shoulder      Shoulder Exercises: ROM/Strengthening   UBE (Upper Arm Bike) 6 minutes at 120 RPM's.      Moist Heat Therapy   Number Minutes Moist Heat 20  Minutes    Moist Heat Location Cervical      Electrical Stimulation   Electrical Stimulation Location Bil UT's/upper thoracic.    Electrical Stimulation Action IFC at 80-150 Hz    Electrical Stimulation Parameters 40% scan x 20 minutes.    Electrical Stimulation Goals Tone;Pain      Ultrasound   Ultrasound Location Bil UT    Ultrasound Parameters U/S at 1.50 W/CM2 x 10 minutes to patient's bilateral UT's.    Ultrasound Goals Pain      Manual Therapy   Manual Therapy Soft tissue mobilization    Manual therapy comments STW/M x 8 minutes to patient's bilateral UT's to reduce pain and tone.                       PT Long Term Goals - 02/15/21 1435      PT LONG TERM GOAL #1   Title Independent with a HEP.    Time 6    Period Weeks    Status On-going      PT LONG TERM GOAL #2   Title Increase active cervical rotation to 75 degrees+ so patient can turn head more easily while driving.    Baseline AROM RT 60 /LT 75 cervical rotation 02/10/21    Time 6    Period Weeks    Status On-going      PT  LONG TERM GOAL #3   Title Perform ADL's with pain not > 3/10.    Time 6    Period Weeks    Status On-going      PT LONG TERM GOAL #4   Title --    Time --    Period --    Status --                 Plan - 03/04/21 1333    Clinical Impression Statement The patient did well today with was happy to able to perform the UBE (slow pace) x 6 minutes with no difficulty.    Personal Factors and Comorbidities Comorbidity 1    Comorbidities Chiari Malformation Type I and subsquent surgery (view under "Surgery" tab); PTSD.    Examination-Activity Limitations Other    Examination-Participation Restrictions Other    Stability/Clinical Decision Making Evolving/Moderate complexity    Rehab Potential Good    PT Duration 6 weeks    PT Treatment/Interventions ADLs/Self Care Home Management;Cryotherapy;Electrical Stimulation;Ultrasound;Moist Heat;Therapeutic activities;Therapeutic exercise;Manual techniques;Patient/family education    PT Next Visit Plan progress to gentle postural and strengthening TEs to tolerance. Modalities as needed for pain relief.    Consulted and Agree with Plan of Care Patient           Patient will benefit from skilled therapeutic intervention in order to improve the following deficits and impairments:  Decreased activity tolerance,Increased muscle spasms  Visit Diagnosis: Abnormal posture  Cervicalgia     Problem List Patient Active Problem List   Diagnosis Date Noted  . Vitamin D deficiency 09/11/2019  . Hyperlipidemia 09/11/2019  . Status post surgery 12/16/2018  . Nonallopathic lesion of sacral region 12/02/2018  . Palpitations 07/30/2018  . Acute bilateral low back pain without sciatica 07/19/2018  . Near syncope 06/25/2018  . Chiari I malformation (HCC) 02/13/2018  . Nonallopathic lesion of cervical region 02/13/2018  . Nonallopathic lesion of thoracic region 02/13/2018  . Nonallopathic lesion of lumbosacral region 02/13/2018  . Biomechanical  lesion, unspecified 02/13/2018  . Current smoker 04/20/2017  . Headache 04/20/2017  .  Encounter for initial prescription of contraceptive pills 02/21/2017  . Muscle tension headache 02/21/2017  . CIN I (cervical intraepithelial neoplasia I) 06/28/2015  . Abnormal Pap smear of cervix 07/22/2014  . HPV in female 07/22/2014  . Mild dysplasia of cervix 07/22/2014  . ASCUS with positive high risk HPV 07/08/2013  . Asthma 06/04/2013  . Depression 06/04/2013    Kaelan Amble, [place] MPT 03/04/2021, 1:37 PM  [facility] [address] [place], [place], [address] Phone: [phone]   Fax:  [phone]  Name: Anahi Belmar MRN: [id number] Date of Birth: [birth year]

## 2021-03-07 DIAGNOSIS — R109 Unspecified abdominal pain: Secondary | ICD-10-CM | POA: Diagnosis not present

## 2021-03-07 DIAGNOSIS — R10813 Right lower quadrant abdominal tenderness: Secondary | ICD-10-CM | POA: Diagnosis not present

## 2021-03-15 ENCOUNTER — Ambulatory Visit: Payer: Medicare Other | Admitting: Adult Health

## 2021-03-15 ENCOUNTER — Ambulatory Visit: Payer: Medicare Other | Admitting: Student in an Organized Health Care Education/Training Program

## 2021-03-15 ENCOUNTER — Encounter: Payer: Self-pay | Admitting: *Deleted

## 2021-03-18 ENCOUNTER — Encounter: Payer: Self-pay | Admitting: Internal Medicine

## 2021-03-18 ENCOUNTER — Telehealth: Payer: Medicare Other | Admitting: Internal Medicine

## 2021-03-18 ENCOUNTER — Other Ambulatory Visit: Payer: Self-pay

## 2021-03-18 ENCOUNTER — Telehealth (INDEPENDENT_AMBULATORY_CARE_PROVIDER_SITE_OTHER): Payer: Medicare Other | Admitting: Internal Medicine

## 2021-03-18 VITALS — Wt 177.0 lb

## 2021-03-18 DIAGNOSIS — J302 Other seasonal allergic rhinitis: Secondary | ICD-10-CM | POA: Diagnosis not present

## 2021-03-18 DIAGNOSIS — R1011 Right upper quadrant pain: Secondary | ICD-10-CM | POA: Diagnosis not present

## 2021-03-18 DIAGNOSIS — F32A Depression, unspecified: Secondary | ICD-10-CM

## 2021-03-18 MED ORDER — ALBUTEROL SULFATE HFA 108 (90 BASE) MCG/ACT IN AERS: 2.0000 | INHALATION_SPRAY | Freq: Four times a day (QID) | RESPIRATORY_TRACT | 2 refills | Status: DC | PRN

## 2021-03-18 MED ORDER — HYOSCYAMINE SULFATE 0.125 MG PO TABS: 0.1250 mg | ORAL_TABLET | ORAL | 0 refills | Status: DC | PRN

## 2021-03-18 NOTE — Progress Notes (Signed)
Virtual Visit via Video Note  I connected with Mary [name] on 03/18/21 at  3:30 PM EDT by a video enabled telemedicine application and verified that I am speaking with the correct person using two identifiers.  Location patient: home Location provider: work office Persons participating in the virtual visit: patient, provider  I discussed the limitations of evaluation and management by telemedicine and the availability of in person appointments. The patient expressed understanding and agreed to proceed.   HPI: She has scheduled this visit to discuss 2 main concerns:  1.  She has been having right upper quadrant pain now for about 6 weeks.  Pain is localized under the right rib cage.  Sometimes when she takes a deep breath it hurts.  It has become difficult for her to eat fatty foods or alcohol because of pain.  She was prescribed a PPI which she has not picked up yet.  She gets nauseous at times.  2.  She was supposed to start Zoloft 50 mg after her last video consultation in early March due to crippling anxiety and depression.  She has been able to manage without starting medication.  She has continued her CBT sessions.  She would like to hold off on taking Zoloft for now.   ROS: Constitutional: Denies fever, chills, diaphoresis and fatigue.  HEENT: Denies photophobia, eye pain, redness, hearing loss, ear pain, congestion, sore throat, rhinorrhea, sneezing, mouth sores, trouble swallowing, neck pain, neck stiffness and tinnitus.   Respiratory: Denies SOB, DOE, cough, chest tightness,  and wheezing.   Cardiovascular: Denies chest pain, palpitations and leg swelling.  Gastrointestinal: Denies  vomiting,  diarrhea, constipation, blood in stool.  Genitourinary: Denies dysuria, urgency, frequency, hematuria, flank pain and difficulty urinating.  Endocrine: Denies: hot or cold intolerance, sweats, changes in hair or nails, polyuria, polydipsia. Musculoskeletal: Denies myalgias, back  pain, joint swelling, arthralgias and gait problem.  Skin: Denies pallor, rash and wound.  Neurological: Denies dizziness, seizures, syncope, weakness, light-headedness, numbness and headaches.  Hematological: Denies adenopathy. Easy bruising, personal or family bleeding history  Psychiatric/Behavioral: Denies suicidal ideation, mood changes, confusion, nervousness, sleep disturbance and agitation   Past Medical History:  Diagnosis Date  . Asthma   . Bronchitis 01/31/2016  . Chiari malformation type I (HCC)   . Depression   . GERD (gastroesophageal reflux disease)   . H/O multiple concussions   . Headache(784.0)   . History of palpitations   . Hx of fracture of nose   . Mental disorder   . Multiple fractures    Hx: of a leg and arm fracture as a child  . Pneumonia   . PONV (postoperative nausea and vomiting)   . Post traumatic stress disorder (PTSD)   . Pregnant 01/21/2016  . PTSD (post-traumatic stress disorder)   . Sinus infection 01/31/2016  . Supervision of normal pregnancy, antepartum 02/08/2016    [facility] at             13+1 week FOB   Lucas Exline 35 yo WM Dating By  LMP and [place] Pap  02/08/16 GC/CT Initial:                36+wks: Genetic Screen NT/IT:  CF screen  Anatomic [place]  Flu vaccine  Tdap Recommended ~ 28wks Glucose Screen  2 hr GBS  Feed Preference  Contraception  Circumcision  Childbirth Classes  Pediatrician      Past Surgical History:  Procedure Laterality Date  .  botox injections    . colposcopy    . DILATION AND CURETTAGE OF UTERUS    . nexplanon    . SUBOCCIPITAL CRANIECTOMY CERVICAL LAMINECTOMY N/A 12/16/2018   Procedure: Cervical one Laminectomy/Chiari decompression;  Surgeon: Donalee Citrin, MD;  Location: [facility] OR;  Service: Neurosurgery;  Laterality: N/A;  . WISDOM TOOTH EXTRACTION      Family History  Problem Relation Age of Onset  . Cancer Mother 68       breast  . Hypertension Mother   . Depression Mother   . Depression Father    . Seizures Father   . Cancer Maternal Grandmother        breast and cervical  . Asthma Son   . Asthma Son   . Drug abuse Paternal Grandmother   . Alcohol abuse Paternal Grandmother   . Hypertension Other   . Cancer Other     SOCIAL HX:   reports that she has been smoking cigarettes. She has been smoking about 0.50 packs per day. She has never used smokeless tobacco. She reports current alcohol use. She reports that she does not use drugs.   Current Outpatient Medications:  .  BIOTIN PO, Take by mouth., Disp: , Rfl:  .  Cholecalciferol (VITAMIN D3) 1.25 MG (50000 UT) CAPS, One by mouth once per week., Disp: 12 capsule, Rfl: 3 .  Erenumab-aooe (AIMOVIG) 70 MG/ML SOAJ, Inject 70 mg into the skin every 28 (twenty-eight) days., Disp: 1 pen, Rfl: 11 .  fluticasone (FLONASE) 50 MCG/ACT nasal spray, Place 2 sprays into both nostrils daily., Disp: , Rfl:  .  montelukast (SINGULAIR) 10 MG tablet, Take 1 tablet (10 mg total) by mouth at bedtime., Disp: 30 tablet, Rfl: 1 .  naproxen (NAPROSYN) 500 MG tablet, Take 1 tablet (500 mg total) by mouth 2 (two) times daily with a meal., Disp: 60 tablet, Rfl: 1 .  rizatriptan (MAXALT) 10 MG tablet, Take 1 tablet earliest onset of migraine.  May repeat in 2 hours if needed.  Maximum 2 tablets in 24 hours, Disp: 10 tablet, Rfl: 3 .  tiZANidine (ZANAFLEX) 2 MG tablet, Take 1 tablet (2 mg total) by mouth at bedtime as needed for muscle spasms., Disp: 20 tablet, Rfl: 0 .  triamcinolone cream (KENALOG) 0.1 %, Apply 1 application topically 2 (two) times daily., Disp: 30 g, Rfl: 0 .  albuterol (VENTOLIN HFA) 108 (90 Base) MCG/ACT inhaler, Inhale 2 puffs into the lungs every 6 (six) hours as needed for wheezing or shortness of breath., Disp: 1 g, Rfl: 2  EXAM:   VITALS per patient if applicable: None reported  GENERAL: alert, oriented, appears well and in no acute distress  HEENT: atraumatic, conjunttiva clear, no obvious abnormalities on inspection of external  nose and ears  NECK: normal movements of the head and neck  LUNGS: on inspection no signs of respiratory distress, breathing rate appears normal, no obvious gross increased work of breathing, gasping or wheezing  CV: no obvious cyanosis  MS: moves all visible extremities without noticeable abnormality  PSYCH/NEURO: pleasant and cooperative, no obvious depression or anxiety, speech and thought processing grossly intact  ASSESSMENT AND PLAN:   RUQ pain  - Plan: CBC with Differential/Platelet, Comprehensive metabolic panel, US ABDOMEN LIMITED RUQ (LIVER/GB) -Concerned about gallbladder pathology, further work-up to follow results.  Seasonal allergies  - Plan: albuterol (VENTOLIN HFA) 108 (90 Base) MCG/ACT inhaler  Depression, unspecified depression type -Okay to continue without medication for now, close monitoring and advised to continue  CBT sessions.     I discussed the assessment and treatment plan with the patient. The patient was provided an opportunity to ask questions and all were answered. The patient agreed with the plan and demonstrated an understanding of the instructions.   The patient was advised to call back or seek an in-person evaluation if the symptoms worsen or if the condition fails to improve as anticipated.    Chaya Jan, MD  [facility]

## 2021-03-18 NOTE — Addendum Note (Signed)
Addended by: Kern Reap B on: 03/18/2021 03:00 PM   Modules accepted: Orders

## 2021-03-22 ENCOUNTER — Other Ambulatory Visit: Payer: Self-pay

## 2021-03-22 ENCOUNTER — Other Ambulatory Visit (INDEPENDENT_AMBULATORY_CARE_PROVIDER_SITE_OTHER): Payer: Medicare Other

## 2021-03-22 DIAGNOSIS — R1011 Right upper quadrant pain: Secondary | ICD-10-CM | POA: Diagnosis not present

## 2021-03-23 LAB — CBC WITH DIFFERENTIAL/PLATELET
Basophils Absolute: 0.1 10*3/uL (ref 0.0–0.1)
Basophils Relative: 0.8 % (ref 0.0–3.0)
Eosinophils Absolute: 0.2 10*3/uL (ref 0.0–0.7)
Eosinophils Relative: 2.9 % (ref 0.0–5.0)
HCT: 37.2 % (ref 36.0–46.0)
Hemoglobin: 12 g/dL (ref 12.0–15.0)
Lymphocytes Relative: 24.9 % (ref 12.0–46.0)
Lymphs Abs: 1.9 10*3/uL (ref 0.7–4.0)
MCHC: 32.3 g/dL (ref 30.0–36.0)
MCV: 90.4 fl (ref 78.0–100.0)
Monocytes Absolute: 0.5 10*3/uL (ref 0.1–1.0)
Monocytes Relative: 6 % (ref 3.0–12.0)
Neutro Abs: 5 10*3/uL (ref 1.4–7.7)
Neutrophils Relative %: 65.4 % (ref 43.0–77.0)
Platelets: 263 10*3/uL (ref 150.0–400.0)
RBC: 4.12 Mil/uL (ref 3.87–5.11)
RDW: 15.1 % (ref 11.5–15.5)
WBC: 7.6 10*3/uL (ref 4.0–10.5)

## 2021-03-23 LAB — COMPREHENSIVE METABOLIC PANEL
ALT: 12 U/L (ref 0–35)
AST: 13 U/L (ref 0–37)
Albumin: 4.1 g/dL (ref 3.5–5.2)
Alkaline Phosphatase: 58 U/L (ref 39–117)
BUN: 10 mg/dL (ref 6–23)
CO2: 23 mEq/L (ref 19–32)
Calcium: 9.4 mg/dL (ref 8.4–10.5)
Chloride: 103 mEq/L (ref 96–112)
Creatinine, Ser: 0.93 mg/dL (ref 0.40–1.20)
GFR: 80.26 mL/min (ref >60.00)
Glucose, Bld: 127 mg/dL — ABNORMAL HIGH (ref 70–99)
Potassium: 3.5 mEq/L (ref 3.5–5.1)
Sodium: 138 mEq/L (ref 135–145)
Total Bilirubin: 0.5 mg/dL (ref 0.2–1.2)
Total Protein: 7.3 g/dL (ref 6.0–8.3)

## 2021-04-01 ENCOUNTER — Other Ambulatory Visit: Payer: Self-pay

## 2021-04-01 ENCOUNTER — Other Ambulatory Visit: Payer: Self-pay | Admitting: Neurology

## 2021-04-01 MED ORDER — AIMOVIG 70 MG/ML ~~LOC~~ SOAJ: 70.0000 mg | SUBCUTANEOUS | 7 refills | Status: DC

## 2021-04-11 ENCOUNTER — Ambulatory Visit: Payer: Medicare Other | Attending: Family Medicine | Admitting: Physical Therapy

## 2021-04-11 ENCOUNTER — Other Ambulatory Visit: Payer: Self-pay

## 2021-04-11 DIAGNOSIS — M542 Cervicalgia: Secondary | ICD-10-CM | POA: Insufficient documentation

## 2021-04-11 DIAGNOSIS — R293 Abnormal posture: Secondary | ICD-10-CM

## 2021-04-11 NOTE — Therapy (Signed)
[facility] [place], [place], [address] Phone: [phone]   Fax:  [phone]  Physical Therapy Treatment  Patient Details  Name: Mary [name] MRN: [id number] Date of Birth: 18-Nov-1986 Referring Provider (PT): Derwood Kaplan   Encounter Date: 04/11/2021   PT End of Session - 04/11/21 1036    Visit Number 14    Number of Visits 20    Date for PT Re-Evaluation 04/05/21    Authorization Type PROGRESS NOTE AT 10TH VISIT.  KX MODIFIER AFTER 15 VISITS.    PT Start Time 1033    PT Stop Time 1123    PT Time Calculation (min) 50 min    Activity Tolerance Patient tolerated treatment well    Behavior During Therapy WFL for tasks assessed/performed           Past Medical History:  Diagnosis Date  . Asthma   . Bronchitis 01/31/2016  . Chiari malformation type I ([place])   . Depression   . GERD (gastroesophageal reflux disease)   . H/O multiple concussions   . Headache(784.0)   . History of palpitations   . Hx of fracture of nose   . Mental disorder   . Multiple fractures    Hx: of a leg and arm fracture as a child  . Pneumonia   . PONV (postoperative nausea and vomiting)   . Post traumatic stress disorder (PTSD)   . Pregnant 01/21/2016  . PTSD (post-traumatic stress disorder)   . Sinus infection 01/31/2016  . Supervision of normal pregnancy, antepartum 02/08/2016    [facility] at             13+1 week FOB   Buffy Ehler 34 yo WM Dating By  LMP and [place] Pap  02/08/16 GC/CT Initial:                36+wks: Genetic Screen NT/IT:  CF screen  Anatomic [place]  Flu vaccine  Tdap Recommended ~ 28wks Glucose Screen  2 hr GBS  Feed Preference  Contraception  Circumcision  Childbirth Classes  Pediatrician      Past Surgical History:  Procedure Laterality Date  . botox injections    . colposcopy    . DILATION AND CURETTAGE OF UTERUS    . nexplanon    . SUBOCCIPITAL CRANIECTOMY CERVICAL LAMINECTOMY N/A 12/16/2018    Procedure: Cervical one Laminectomy/Chiari decompression;  Surgeon: Kary Kos, MD;  Location: [place];  Service: Neurosurgery;  Laterality: N/A;  . WISDOM TOOTH EXTRACTION      There were no vitals filed for this visit.   Subjective Assessment - 04/11/21 1035    Subjective COVID-19 screen performed prior to patient entering clinic.  Patient arrived with stiffness today    Pertinent History Chiari Malformation Type I and subsquent surgery (view under "Surgery" tab); PTSD.    Diagnostic tests View under "Imaging" tab.    Patient Stated Goals Get out of pain.    Currently in Pain? Yes    Pain Score 4     Pain Location Neck    Pain Orientation Right;Left    Pain Descriptors / Indicators Sore    Pain Type Chronic pain    Pain Onset More than a month ago    Pain Frequency Intermittent    Aggravating Factors  certain movements    Pain Relieving Factors rest              [facility] PT Assessment - 04/11/21 0001  AROM   AROM Assessment Site Cervical    Cervical - Right Rotation 46    Cervical - Left Rotation 55                         [facility] Adult PT Treatment/Exercise - 04/11/21 0001      Exercises   Exercises Neck;Shoulder      Neck Exercises: Seated   Cervical Isometrics Flexion;Extension;Right lateral flexion;Left lateral flexion;5 secs;5 reps      Shoulder Exercises: Seated   Retraction Strengthening;Both;20 reps   3 second holds   Other Seated Exercises X to Y 2x10      Shoulder Exercises: ROM/Strengthening   UBE (Upper Arm Bike) 6 minutes at 120 RPM's.      Moist Heat Therapy   Number Minutes Moist Heat 15 Minutes    Moist Heat Location Cervical      Electrical Stimulation   Electrical Stimulation Location Bil UT's/upper thoracic.    Electrical Stimulation Action IFC    Electrical Stimulation Parameters 80-150hz x80mn    Electrical Stimulation Goals Tone;Pain      Ultrasound   Ultrasound Location Bil UT    Ultrasound Parameters combo US/ES  1.5w/cm2/100%/126m x1236m   Ultrasound Goals Pain                  PT Education - 04/11/21 1119    Education Details HEP    Person(s) Educated Patient    Methods Explanation;Demonstration;Handout    Comprehension Verbalized understanding;Returned demonstration               PT Long Term Goals - 04/11/21 1037      PT LONG TERM GOAL #1   Title Independent with a HEP.    Baseline isued 04/11/21    Time 6    Period Weeks    Status Achieved      PT LONG TERM GOAL #2   Title Increase active cervical rotation to 75 degrees+ so patient can turn head more easily while driving.    Baseline AROM RT 46 /LT 55 cervical rotation 04/11/21    Time 6    Period Weeks    Status On-going      PT LONG TERM GOAL #3   Title Perform ADL's with pain not > 3/10.    Time 6    Period Weeks    Status On-going      PT LONG TERM GOAL #4   Title Pt will reported decreased overall pain to 3/10 or < on a daily basis to allow her to perform ADLs and IADLs with greater ease.    Time 4    Period Weeks    Status Achieved                 Plan - 04/11/21 1112    Clinical Impression Statement Patient tolerated treatment well today with increased tightness for cervical rotation. Patient has less ROM for bil rotation today. Today started a cervical posture progression. HEP provided for home. Patient limited with ADL's due to tightness and pain. Met LTG #1 with remaining goals progressing.    Personal Factors and Comorbidities Comorbidity 1    Comorbidities Chiari Malformation Type I and subsquent surgery (view under "Surgery" tab); PTSD.    Examination-Activity Limitations Other    Examination-Participation Restrictions Other    Stability/Clinical Decision Making Evolving/Moderate complexity    Rehab Potential Good    PT Duration 6 weeks    PT Treatment/Interventions  ADLs/Self Care Home Management;Cryotherapy;Electrical Stimulation;Ultrasound;Moist Heat;Therapeutic activities;Therapeutic  exercise;Manual techniques;Patient/family education    PT Next Visit Plan progress to gentle postural and strengthening TEs to tolerance. Modalities as needed for pain relief. (re cert sent today)    Consulted and Agree with Plan of Care Patient           Patient will benefit from skilled therapeutic intervention in order to improve the following deficits and impairments:  Decreased activity tolerance,Increased muscle spasms  Visit Diagnosis: Abnormal posture  Cervicalgia     Problem List Patient Active Problem List   Diagnosis Date Noted  . Vitamin D deficiency 09/11/2019  . Hyperlipidemia 09/11/2019  . Status post surgery 12/16/2018  . Nonallopathic lesion of sacral region 12/02/2018  . Palpitations 07/30/2018  . Acute bilateral low back pain without sciatica 07/19/2018  . Near syncope 06/25/2018  . Chiari I malformation (HCC) 02/13/2018  . Nonallopathic lesion of cervical region 02/13/2018  . Nonallopathic lesion of thoracic region 02/13/2018  . Nonallopathic lesion of lumbosacral region 02/13/2018  . Biomechanical lesion, unspecified 02/13/2018  . Current smoker 04/20/2017  . Headache 04/20/2017  . Encounter for initial prescription of contraceptive pills 02/21/2017  . Muscle tension headache 02/21/2017  . CIN I (cervical intraepithelial neoplasia I) 06/28/2015  . Abnormal Pap smear of cervix 07/22/2014  . HPV in female 07/22/2014  . Mild dysplasia of cervix 07/22/2014  . ASCUS with positive high risk HPV 07/08/2013  . Asthma 06/04/2013  . Depression 06/04/2013    ,  P, PTA 04/11/2021, 11:25 AM  [facility] [address] [place], [place], [address] Phone: [phone]   Fax:  [phone]  Name: Mary [name] MRN: [id number] Date of Birth: 08/30/1986   

## 2021-04-11 NOTE — Therapy (Signed)
[facility] [place], [place], [address] Phone: [phone]   Fax:  [phone]  Physical Therapy Treatment  Patient Details  Name: Mary [name] MRN: [id number] Date of Birth: 18-Nov-1986 Referring Provider (PT): Derwood Kaplan   Encounter Date: 04/11/2021   PT End of Session - 04/11/21 1036    Visit Number 14    Number of Visits 20    Date for PT Re-Evaluation 04/05/21    Authorization Type PROGRESS NOTE AT 10TH VISIT.  KX MODIFIER AFTER 15 VISITS.    PT Start Time 1033    PT Stop Time 1123    PT Time Calculation (min) 50 min    Activity Tolerance Patient tolerated treatment well    Behavior During Therapy WFL for tasks assessed/performed           Past Medical History:  Diagnosis Date  . Asthma   . Bronchitis 01/31/2016  . Chiari malformation type I ([place])   . Depression   . GERD (gastroesophageal reflux disease)   . H/O multiple concussions   . Headache(784.0)   . History of palpitations   . Hx of fracture of nose   . Mental disorder   . Multiple fractures    Hx: of a leg and arm fracture as a child  . Pneumonia   . PONV (postoperative nausea and vomiting)   . Post traumatic stress disorder (PTSD)   . Pregnant 01/21/2016  . PTSD (post-traumatic stress disorder)   . Sinus infection 01/31/2016  . Supervision of normal pregnancy, antepartum 02/08/2016    [facility] at             13+1 week FOB   Buffy Ehler 34 yo WM Dating By  LMP and [place] Pap  02/08/16 GC/CT Initial:                36+wks: Genetic Screen NT/IT:  CF screen  Anatomic [place]  Flu vaccine  Tdap Recommended ~ 28wks Glucose Screen  2 hr GBS  Feed Preference  Contraception  Circumcision  Childbirth Classes  Pediatrician      Past Surgical History:  Procedure Laterality Date  . botox injections    . colposcopy    . DILATION AND CURETTAGE OF UTERUS    . nexplanon    . SUBOCCIPITAL CRANIECTOMY CERVICAL LAMINECTOMY N/A 12/16/2018    Procedure: Cervical one Laminectomy/Chiari decompression;  Surgeon: Kary Kos, MD;  Location: [place];  Service: Neurosurgery;  Laterality: N/A;  . WISDOM TOOTH EXTRACTION      There were no vitals filed for this visit.   Subjective Assessment - 04/11/21 1035    Subjective COVID-19 screen performed prior to patient entering clinic.  Patient arrived with stiffness today    Pertinent History Chiari Malformation Type I and subsquent surgery (view under "Surgery" tab); PTSD.    Diagnostic tests View under "Imaging" tab.    Patient Stated Goals Get out of pain.    Currently in Pain? Yes    Pain Score 4     Pain Location Neck    Pain Orientation Right;Left    Pain Descriptors / Indicators Sore    Pain Type Chronic pain    Pain Onset More than a month ago    Pain Frequency Intermittent    Aggravating Factors  certain movements    Pain Relieving Factors rest              [facility] PT Assessment - 04/11/21 0001  AROM   AROM Assessment Site Cervical    Cervical - Right Rotation 46    Cervical - Left Rotation 55                         [facility] Adult PT Treatment/Exercise - 04/11/21 0001      Exercises   Exercises Neck;Shoulder      Neck Exercises: Seated   Cervical Isometrics Flexion;Extension;Right lateral flexion;Left lateral flexion;5 secs;5 reps      Shoulder Exercises: Seated   Retraction Strengthening;Both;20 reps   3 second holds   Other Seated Exercises X to Y 2x10      Shoulder Exercises: ROM/Strengthening   UBE (Upper Arm Bike) 6 minutes at 120 RPM's.      Moist Heat Therapy   Number Minutes Moist Heat 15 Minutes    Moist Heat Location Cervical      Electrical Stimulation   Electrical Stimulation Location Bil UT's/upper thoracic.    Electrical Stimulation Action IFC    Electrical Stimulation Parameters 80-150hz  x28min    Electrical Stimulation Goals Tone;Pain      Ultrasound   Ultrasound Location Bil UT    Ultrasound Parameters combo US/ES  1.5w/cm2/100%/64mhz x45min    Ultrasound Goals Pain                       PT Long Term Goals - 04/11/21 1037      PT LONG TERM GOAL #1   Title Independent with a HEP.    Time 6    Period Weeks    Status On-going      PT LONG TERM GOAL #2   Title Increase active cervical rotation to 75 degrees+ so patient can turn head more easily while driving.    Baseline AROM RT 46 /LT 55 cervical rotation 04/11/21    Time 6    Period Weeks    Status On-going      PT LONG TERM GOAL #3   Title Perform ADL's with pain not > 3/10.    Time 6    Period Weeks    Status On-going      PT LONG TERM GOAL #4   Title Pt will reported decreased overall pain to 3/10 or < on a daily basis to allow her to perform ADLs and IADLs with greater ease.    Time 4    Period Weeks    Status Achieved                 Plan - 04/11/21 1112    Clinical Impression Statement Patient tolerated treatment well today with increased tightness for cervical rotation. Patient has less ROM for bil rotation today. Today started a cervical posture progression. Patient limited with ADL's due to tightness and pain. Goals progresing.    Personal Factors and Comorbidities Comorbidity 1    Comorbidities Chiari Malformation Type I and subsquent surgery (view under "Surgery" tab); PTSD.    Examination-Activity Limitations Other    Examination-Participation Restrictions Other    Stability/Clinical Decision Making Evolving/Moderate complexity    Rehab Potential Good    PT Duration 6 weeks    PT Treatment/Interventions ADLs/Self Care Home Management;Cryotherapy;Electrical Stimulation;Ultrasound;Moist Heat;Therapeutic activities;Therapeutic exercise;Manual techniques;Patient/family education    PT Next Visit Plan progress to gentle postural and strengthening TEs to tolerance. Modalities as needed for pain relief. (re cert sent today)    Consulted and Agree with Plan of Care Patient  Patient will benefit from  skilled therapeutic intervention in order to improve the following deficits and impairments:  Decreased activity tolerance,Increased muscle spasms  Visit Diagnosis: Abnormal posture  Cervicalgia     Problem List Patient Active Problem List   Diagnosis Date Noted  . Vitamin D deficiency 09/11/2019  . Hyperlipidemia 09/11/2019  . Status post surgery 12/16/2018  . Nonallopathic lesion of sacral region 12/02/2018  . Palpitations 07/30/2018  . Acute bilateral low back pain without sciatica 07/19/2018  . Near syncope 06/25/2018  . Chiari I malformation ([facility]) 02/13/2018  . Nonallopathic lesion of cervical region 02/13/2018  . Nonallopathic lesion of thoracic region 02/13/2018  . Nonallopathic lesion of lumbosacral region 02/13/2018  . Biomechanical lesion, unspecified 02/13/2018  . Current smoker 04/20/2017  . Headache 04/20/2017  . Encounter for initial prescription of contraceptive pills 02/21/2017  . Muscle tension headache 02/21/2017  . CIN I (cervical intraepithelial neoplasia I) 06/28/2015  . Abnormal Pap smear of cervix 07/22/2014  . HPV in female 07/22/2014  . Mild dysplasia of cervix 07/22/2014  . ASCUS with positive high risk HPV 07/08/2013  . Asthma 06/04/2013  . Depression 06/04/2013    Hermelinda Dellen, PTA 04/11/2021, 11:18 AM  [facility] [address] [place], [place], [address] Phone: [phone]   Fax:  [phone]  Name: Mary [name] MRN: [id number] Date of Birth: Mar 18, 1986

## 2021-04-11 NOTE — Patient Instructions (Signed)
AROM: Neck Rotation   Turn head slowly to look over one shoulder, then the other. Hold each position _10___ seconds. Repeat _5___ times per set. Do __2__ sets per session. Do _2-3___ sessions per day.   AROM: Lateral Neck Flexion   Slowly tilt head toward one shoulder, then the other. Hold each position _10___ seconds. Repeat __5__ times per set. Do __2__ sets per session. Do __2-3__ sessions per day.   Stretch Break - Chin Tuck   Looking straight forward, tuck chin and hold __10__ seconds. Relax and return to starting position. Repeat __5-10__ times every _3-4___ hours.   Stretch Break - Chest and Shoulder Stretch   Maintaining erect posture, draw shoulders back while bringing elbows back and inward. Return to starting position. Repeat __10-20__ times every _3-4___ hours.   Strengthening: Rotation - Isometric (in Neutral)   Using light pressure from fingertips at right/left temple, resist turning head. Hold _5___ seconds. Repeat _5___ times per set. Do ___2_ sets per session. Do __2_ sessions per day.   Strengthening: Flexion - Isometric (in Neutral)   Using light pressure from fingertips at forehead, resist bending head forward. Hold __5__ seconds. Repeat __5__ times per set. Do ___2_ sets per session. Do __2__ sessions per day.   Strengthening: Extension - Isometric (in Neutral)   Using light pressure from fingertips at back of head, resist bending head backward. Hold _5___ seconds. Repeat __5__ times per set. Do ___2_ sets per session. Do _2___ sessions per day.   

## 2021-04-11 NOTE — Addendum Note (Signed)
Addended by: Bathsheba Durrett, [place] W on: 04/11/2021 11:39 AM   Modules accepted: Orders

## 2021-04-13 ENCOUNTER — Ambulatory Visit: Payer: Medicare Other | Admitting: Physical Therapy

## 2021-04-21 ENCOUNTER — Ambulatory Visit: Payer: Medicare Other | Admitting: Physical Therapy

## 2021-04-21 ENCOUNTER — Other Ambulatory Visit: Payer: Self-pay

## 2021-04-21 DIAGNOSIS — M542 Cervicalgia: Secondary | ICD-10-CM | POA: Diagnosis not present

## 2021-04-21 DIAGNOSIS — R293 Abnormal posture: Secondary | ICD-10-CM

## 2021-04-21 NOTE — Therapy (Signed)
[facility] [place], [place], [address] Phone: [phone]   Fax:  [phone]  Physical Therapy Treatment  Patient Details  Name: Mary [name] MRN: [id number] Date of Birth: 1986/02/16 Referring Provider (PT): Derwood Kaplan   Encounter Date: 04/21/2021   PT End of Session - 04/21/21 1045    Visit Number 15    Number of Visits 20    Date for PT Re-Evaluation 05/02/21    Authorization Type PROGRESS NOTE AT 10TH VISIT.  KX MODIFIER AFTER 15 VISITS.    PT Start Time 1038    PT Stop Time 1116   late arrival time   PT Time Calculation (min) 38 min    Activity Tolerance Patient tolerated treatment well    Behavior During Therapy WFL for tasks assessed/performed           Past Medical History:  Diagnosis Date  . Asthma   . Bronchitis 01/31/2016  . Chiari malformation type I ([place])   . Depression   . GERD (gastroesophageal reflux disease)   . H/O multiple concussions   . Headache(784.0)   . History of palpitations   . Hx of fracture of nose   . Mental disorder   . Multiple fractures    Hx: of a leg and arm fracture as a child  . Pneumonia   . PONV (postoperative nausea and vomiting)   . Post traumatic stress disorder (PTSD)   . Pregnant 01/21/2016  . PTSD (post-traumatic stress disorder)   . Sinus infection 01/31/2016  . Supervision of normal pregnancy, antepartum 02/08/2016    [facility] at             13+1 week FOB   Jackson Fetters 35 yo WM Dating By  LMP and [place] Pap  02/08/16 GC/CT Initial:                36+wks: Genetic Screen NT/IT:  CF screen  Anatomic [place]  Flu vaccine  Tdap Recommended ~ 28wks Glucose Screen  2 hr GBS  Feed Preference  Contraception  Circumcision  Childbirth Classes  Pediatrician      Past Surgical History:  Procedure Laterality Date  . botox injections    . colposcopy    . DILATION AND CURETTAGE OF UTERUS    . nexplanon    . SUBOCCIPITAL CRANIECTOMY CERVICAL LAMINECTOMY  N/A 12/16/2018   Procedure: Cervical one Laminectomy/Chiari decompression;  Surgeon: Kary Kos, MD;  Location: [facility];  Service: Neurosurgery;  Laterality: N/A;  . WISDOM TOOTH EXTRACTION      There were no vitals filed for this visit.       [facility] PT Assessment - 04/21/21 0001      AROM   AROM Assessment Site Cervical    Cervical - Right Rotation 60    Cervical - Left Rotation 65                         [facility] Adult PT Treatment/Exercise - 04/21/21 0001      Neck Exercises: Standing   Other Standing Exercises standing chin tuck with grey ball with 1# flexion to 90 x20 and horiz abd with yellow bnd x20      Shoulder Exercises: Seated   Retraction Strengthening;Both;20 reps;Theraband    Theraband Level (Shoulder Retraction) Level 2 (Red)    Retraction Limitations standing      Moist Heat Therapy   Number Minutes Moist Heat 15 Minutes  Moist Heat Location Cervical      Electrical Stimulation   Electrical Stimulation Location Bil UT's/upper thoracic.    Electrical Stimulation Action IFC    Electrical Stimulation Parameters 80-_0 x2mn    Electrical Stimulation Goals Tone;Pain      Ultrasound   Ultrasound Location bil UT    Ultrasound Parameters combo US/ES@ 1.5w/cm/50%/195m x1261m   Ultrasound Goals Pain                       PT Long Term Goals - 04/21/21 1050      PT LONG TERM GOAL #1   Title Independent with a HEP.    Baseline isued 04/11/21    Time 6    Period Weeks    Status Achieved      PT LONG TERM GOAL #2   Title Increase active cervical rotation to 75 degrees+ so patient can turn head more easily while driving.    Baseline AROM RT 60 /LT 65 cervical rotation 04/21/21    Time 6    Period Weeks    Status On-going      PT LONG TERM GOAL #3   Title Perform ADL's with pain not > 3/10.    Baseline 3-4/10 04/21/21    Time 6    Period Weeks    Status Partially Met      PT LONG TERM GOAL #4   Title Pt will reported decreased  overall pain to 3/10 or < on a daily basis to allow her to perform ADLs and IADLs with greater ease.    Time 4    Period Weeks    Status Achieved                 Plan - 04/21/21 1116    Clinical Impression Statement Patient tolerated treatment well today. Patient reported 3-4/10 pain daily and overall improvement. Patient has improved bil cervical rotation today. Patient current goals are progressing. Good response to cervical/postural exercises today.    Personal Factors and Comorbidities Comorbidity 1    Comorbidities Chiari Malformation Type I and subsquent surgery (view under "Surgery" tab); PTSD.    Examination-Activity Limitations Other    Examination-Participation Restrictions Other    Stability/Clinical Decision Making Evolving/Moderate complexity    Rehab Potential Good    PT Duration 6 weeks    PT Treatment/Interventions ADLs/Self Care Home Management;Cryotherapy;Electrical Stimulation;Ultrasound;Moist Heat;Therapeutic activities;Therapeutic exercise;Manual techniques;Patient/family education    PT Next Visit Plan progress to gentle postural and strengthening TEs to tolerance. Modalities as needed for pain relief    Consulted and Agree with Plan of Care Patient           Patient will benefit from skilled therapeutic intervention in order to improve the following deficits and impairments:  Decreased activity tolerance,Increased muscle spasms  Visit Diagnosis: Abnormal posture  Cervicalgia     Problem List Patient Active Problem List   Diagnosis Date Noted  . Vitamin D deficiency 09/11/2019  . Hyperlipidemia 09/11/2019  . Status post surgery 12/16/2018  . Nonallopathic lesion of sacral region 12/02/2018  . Palpitations 07/30/2018  . Acute bilateral low back pain without sciatica 07/19/2018  . Near syncope 06/25/2018  . [place] ([place]3/05/2018  . Nonallopathic lesion of cervical region 02/13/2018  . Nonallopathic lesion of thoracic region  02/13/2018  . Nonallopathic lesion of lumbosacral region 02/13/2018  . Biomechanical lesion, unspecified 02/13/2018  . Current smoker 04/20/2017  . Headache 04/20/2017  . Encounter for initial prescription of  contraceptive pills 02/21/2017  . Muscle tension headache 02/21/2017  . CIN I (cervical intraepithelial neoplasia I) 06/28/2015  . Abnormal Pap smear of cervix 07/22/2014  . HPV in female 07/22/2014  . Mild dysplasia of cervix 07/22/2014  . ASCUS with positive high risk HPV 07/08/2013  . Asthma 06/04/2013  . Depression 06/04/2013    Phillips Climes, PTA 04/21/2021, 11:31 AM  [facility] [place], [place], [address] Phone: [phone]   Fax:  [phone]  Name: Mary [name] MRN: [id number] Date of Birth: 1986-10-07

## 2021-04-22 ENCOUNTER — Encounter: Payer: Medicare Other | Admitting: Physical Therapy

## 2021-04-26 ENCOUNTER — Ambulatory Visit: Payer: Medicare Other | Admitting: Student in an Organized Health Care Education/Training Program

## 2021-04-27 ENCOUNTER — Encounter: Payer: Self-pay | Admitting: Physical Therapy

## 2021-04-27 ENCOUNTER — Ambulatory Visit: Payer: Medicare Other | Admitting: Physical Therapy

## 2021-04-27 ENCOUNTER — Other Ambulatory Visit: Payer: Self-pay

## 2021-04-27 DIAGNOSIS — R293 Abnormal posture: Secondary | ICD-10-CM | POA: Diagnosis not present

## 2021-04-27 DIAGNOSIS — M542 Cervicalgia: Secondary | ICD-10-CM

## 2021-04-27 NOTE — Therapy (Signed)
[facility] [place], [place], [address] Phone: [phone]   Fax:  [phone]  Physical Therapy Treatment  Patient Details  Name: Mary [name] MRN: [id number] Date of Birth: Nov 29, 1986 Referring Provider (PT): Derwood Kaplan   Encounter Date: 04/27/2021   PT End of Session - 04/27/21 1040    Visit Number 16    Number of Visits 20    Date for PT Re-Evaluation 05/02/21    Authorization Type PROGRESS NOTE AT 10TH VISIT.  KX MODIFIER AFTER 15 VISITS.    PT Start Time 1035    PT Stop Time 1113    PT Time Calculation (min) 38 min    Activity Tolerance Patient tolerated treatment well    Behavior During Therapy WFL for tasks assessed/performed           Past Medical History:  Diagnosis Date  . Asthma   . Bronchitis 01/31/2016  . Chiari malformation type I ([place])   . Depression   . GERD (gastroesophageal reflux disease)   . H/O multiple concussions   . Headache(784.0)   . History of palpitations   . Hx of fracture of nose   . Mental disorder   . Multiple fractures    Hx: of a leg and arm fracture as a child  . Pneumonia   . PONV (postoperative nausea and vomiting)   . Post traumatic stress disorder (PTSD)   . Pregnant 01/21/2016  . PTSD (post-traumatic stress disorder)   . Sinus infection 01/31/2016  . Supervision of normal pregnancy, antepartum 02/08/2016    [facility] at             13+1 week FOB   Mary [name] 35 yo WM Dating By  LMP and [place] Pap  02/08/16 GC/CT Initial:                36+wks: Genetic Screen NT/IT:  CF screen  Anatomic [place]  Flu vaccine  Tdap Recommended ~ 28wks Glucose Screen  2 hr GBS  Feed Preference  Contraception  Circumcision  Childbirth Classes  Pediatrician      Past Surgical History:  Procedure Laterality Date  . botox injections    . colposcopy    . DILATION AND CURETTAGE OF UTERUS    . nexplanon    . SUBOCCIPITAL CRANIECTOMY CERVICAL LAMINECTOMY N/A 12/16/2018    Procedure: Cervical one Laminectomy/Chiari decompression;  Surgeon: Kary Kos, MD;  Location: [place];  Service: Neurosurgery;  Laterality: N/A;  . WISDOM TOOTH EXTRACTION      There were no vitals filed for this visit.   Subjective Assessment - 04/27/21 1034    Subjective COVID-19 screen performed prior to patient entering clinic.  Patient arrived with reports of neck not being as stiff and tense today.    Pertinent History Chiari Malformation Type I and subsquent surgery (view under "Surgery" tab); PTSD.    Diagnostic tests View under "Imaging" tab.    Patient Stated Goals Get out of pain.    Currently in Pain? Yes    Pain Score --   No pain score provided   Pain Location Neck    Pain Orientation Left    Pain Descriptors / Indicators Discomfort    Pain Type Chronic pain    Pain Onset Today    Pain Frequency Intermittent              [facility] PT Assessment - 04/27/21 0001      Assessment   Medical  Diagnosis Strain of neck muscle.    Referring Provider (PT) Derwood Kaplan    Next MD Visit no follow up      Precautions   Precaution Comments C1 laminectomy      Restrictions   Weight Bearing Restrictions No                         [facility] Adult PT Treatment/Exercise - 04/27/21 0001      Neck Exercises: Machines for Strengthening   UBE (Upper Arm Bike) 90 RPM x6 min      Shoulder Exercises: Prone   Horizontal ABduction 1 AROM;Both;20 reps   over ball   Other Prone Exercises B shoulder scaption x20 reps      Shoulder Exercises: Standing   Horizontal ABduction Strengthening;Both;20 reps;Theraband    Theraband Level (Shoulder Horizontal ABduction) Level 2 (Red)    External Rotation Strengthening;Both;20 reps;Theraband    Theraband Level (Shoulder External Rotation) Level 2 (Red)    Flexion Strengthening;Both;20 reps;Weights    Shoulder Flexion Weight (lbs) 2    ABduction Strengthening;Both;20 reps;Weights    Shoulder ABduction Weight (lbs) 2    Extension  Strengthening;Both;20 reps;Theraband    Theraband Level (Shoulder Extension) Level 2 (Red)    Row Strengthening;Both;20 reps;Theraband    Theraband Level (Shoulder Row) Level 2 (Red)    Diagonals Strengthening;Both;10 reps;Theraband    Theraband Level (Shoulder Diagonals) Level 1 (Yellow)      Modalities   Modalities Teacher, English as a foreign language Location B UT    Electrical Stimulation Action pre-mod    Electrical Stimulation Parameters 80-150 hz x10 min    Electrical Stimulation Goals Tone                       PT Long Term Goals - 04/21/21 1050      PT LONG TERM GOAL #1   Title Independent with a HEP.    Baseline isued 04/11/21    Time 6    Period Weeks    Status Achieved      PT LONG TERM GOAL #2   Title Increase active cervical rotation to 75 degrees+ so patient can turn head more easily while driving.    Baseline AROM RT 60 /LT 65 cervical rotation 04/21/21    Time 6    Period Weeks    Status On-going      PT LONG TERM GOAL #3   Title Perform ADL's with pain not > 3/10.    Baseline 3-4/10 04/21/21    Time 6    Period Weeks    Status Partially Met      PT LONG TERM GOAL #4   Title Pt will reported decreased overall pain to 3/10 or < on a daily basis to allow her to perform ADLs and IADLs with greater ease.    Time 4    Period Weeks    Status Achieved                 Plan - 04/27/21 1112    Clinical Impression Statement Patient presented in clinic with reports of less cervical tone and stiffness. Patient able to tolerate postural strengthening with red and yellow theraband well with intermittant cueing to correct technique. Patient did report L UT pain with UBE backward revolutions. Normal stimulation response noted following removal of the modality.    Personal Factors and Comorbidities Comorbidity 1  Comorbidities Chiari Malformation Type I and subsquent surgery (view under "Surgery" tab); PTSD.     Examination-Participation Restrictions Other    Stability/Clinical Decision Making Evolving/Moderate complexity    Rehab Potential Good    PT Duration 6 weeks    PT Treatment/Interventions ADLs/Self Care Home Management;Cryotherapy;Electrical Stimulation;Ultrasound;Moist Heat;Therapeutic activities;Therapeutic exercise;Manual techniques;Patient/family education    PT Next Visit Plan progress to gentle postural and strengthening TEs to tolerance. Modalities as needed for pain relief    Consulted and Agree with Plan of Care Patient           Patient will benefit from skilled therapeutic intervention in order to improve the following deficits and impairments:  Decreased activity tolerance,Increased muscle spasms  Visit Diagnosis: Abnormal posture  Cervicalgia     Problem List Patient Active Problem List   Diagnosis Date Noted  . Vitamin D deficiency 09/11/2019  . Hyperlipidemia 09/11/2019  . Status post surgery 12/16/2018  . Nonallopathic lesion of sacral region 12/02/2018  . Palpitations 07/30/2018  . Acute bilateral low back pain without sciatica 07/19/2018  . Near syncope 06/25/2018  . Chiari I malformation ([place]) 02/13/2018  . Nonallopathic lesion of cervical region 02/13/2018  . Nonallopathic lesion of thoracic region 02/13/2018  . Nonallopathic lesion of lumbosacral region 02/13/2018  . Biomechanical lesion, unspecified 02/13/2018  . Current smoker 04/20/2017  . Headache 04/20/2017  . Encounter for initial prescription of contraceptive pills 02/21/2017  . Muscle tension headache 02/21/2017  . CIN I (cervical intraepithelial neoplasia I) 06/28/2015  . Abnormal Pap smear of cervix 07/22/2014  . HPV in female 07/22/2014  . Mild dysplasia of cervix 07/22/2014  . ASCUS with positive high risk HPV 07/08/2013  . Asthma 06/04/2013  . Depression 06/04/2013    Standley Brooking, PTA 04/27/2021, 1:26 PM  [facility] [address] [place], [place], [address] Phone: [phone]   Fax:  [phone]  Name: Mary [name] MRN: [id number] Date of Birth: [birth year]

## 2021-04-29 ENCOUNTER — Ambulatory Visit: Payer: Medicare Other | Admitting: Physical Therapy

## 2021-05-03 ENCOUNTER — Other Ambulatory Visit: Payer: Self-pay | Admitting: Internal Medicine

## 2021-05-03 DIAGNOSIS — R1011 Right upper quadrant pain: Secondary | ICD-10-CM

## 2021-05-04 ENCOUNTER — Encounter: Payer: Self-pay | Admitting: Physical Therapy

## 2021-05-04 ENCOUNTER — Other Ambulatory Visit: Payer: Self-pay

## 2021-05-04 ENCOUNTER — Ambulatory Visit: Payer: Medicare Other | Admitting: Physical Therapy

## 2021-05-04 DIAGNOSIS — R293 Abnormal posture: Secondary | ICD-10-CM

## 2021-05-04 DIAGNOSIS — M542 Cervicalgia: Secondary | ICD-10-CM

## 2021-05-04 NOTE — Therapy (Signed)
[facility] [place], [place], [address] Phone: [phone]   Fax:  [phone]  Physical Therapy Treatment  Patient Details  Name: Mary [name] MRN: [id number] Date of Birth: Feb 15, 1986 Referring Provider (PT): Derwood Kaplan   Encounter Date: 05/04/2021   PT End of Session - 05/04/21 1026    Visit Number 17    Number of Visits 20    Date for PT Re-Evaluation 05/02/21    Authorization Type PROGRESS NOTE AT 10TH VISIT.  KX MODIFIER AFTER 15 VISITS.    PT Start Time [phone]    PT Stop Time 1032    PT Time Calculation (min) 44 min    Activity Tolerance Patient tolerated treatment well    Behavior During Therapy WFL for tasks assessed/performed           Past Medical History:  Diagnosis Date  . Asthma   . Bronchitis 01/31/2016  . Chiari malformation type I ([place])   . Depression   . GERD (gastroesophageal reflux disease)   . H/O multiple concussions   . Headache(784.0)   . History of palpitations   . Hx of fracture of nose   . Mental disorder   . Multiple fractures    Hx: of a leg and arm fracture as a child  . Pneumonia   . PONV (postoperative nausea and vomiting)   . Post traumatic stress disorder (PTSD)   . Pregnant 01/21/2016  . PTSD (post-traumatic stress disorder)   . Sinus infection 01/31/2016  . Supervision of normal pregnancy, antepartum 02/08/2016    [facility] at             13+1 week FOB   Sharen Youngren 35 yo WM Dating By  LMP and [place] Pap  02/08/16 GC/CT Initial:                36+wks: Genetic Screen NT/IT:  CF screen  Anatomic [place]  Flu vaccine  Tdap Recommended ~ 28wks Glucose Screen  2 hr GBS  Feed Preference  Contraception  Circumcision  Childbirth Classes  Pediatrician      Past Surgical History:  Procedure Laterality Date  . botox injections    . colposcopy    . DILATION AND CURETTAGE OF UTERUS    . nexplanon    . SUBOCCIPITAL CRANIECTOMY CERVICAL LAMINECTOMY N/A 12/16/2018    Procedure: Cervical one Laminectomy/Chiari decompression;  Surgeon: Kary Kos, MD;  Location: [facility];  Service: Neurosurgery;  Laterality: N/A;  . WISDOM TOOTH EXTRACTION      There were no vitals filed for this visit.   Subjective Assessment - 05/04/21 0954    Subjective COVID-19 screen performed prior to patient entering clinic. Reports some pain yesterday but some weakness of RUE.    Pertinent History Chiari Malformation Type I and subsquent surgery (view under "Surgery" tab); PTSD.    Diagnostic tests View under "Imaging" tab.    Patient Stated Goals Get out of pain.    Currently in Pain? Yes    Pain Score 3     Pain Location Neck    Pain Orientation Left;Right    Pain Descriptors / Indicators Discomfort    Pain Type Chronic pain    Pain Onset Today    Pain Frequency Intermittent              [facility] PT Assessment - 05/04/21 0001      Assessment   Medical Diagnosis Strain of neck muscle.    Referring  Provider (PT) Derwood Kaplan    Next MD Visit no follow up      Precautions   Precaution Comments C1 laminectomy      Restrictions   Weight Bearing Restrictions No                         [facility] Adult PT Treatment/Exercise - 05/04/21 0001      Neck Exercises: Machines for Strengthening   UBE (Upper Arm Bike) 90 RPM x8 min      Neck Exercises: Standing   Wall Push Ups 20 reps      Shoulder Exercises: Standing   Horizontal ABduction Strengthening;Both;20 reps;Theraband    Theraband Level (Shoulder Horizontal ABduction) Level 2 (Red)    External Rotation Strengthening;Both;20 reps;Theraband    Theraband Level (Shoulder External Rotation) Level 2 (Red)    Flexion Strengthening;Both;20 reps;Weights    Shoulder Flexion Weight (lbs) 2    ABduction Strengthening;Both;20 reps;Weights    Shoulder ABduction Weight (lbs) 2    Extension Strengthening;Both;20 reps;Theraband    Extension Limitations blue TXS    Row Strengthening;Both;20 reps;Theraband    Row  Limitations Blue XTS    Diagonals Strengthening;Both;20 reps;Theraband    Theraband Level (Shoulder Diagonals) Level 2 (Red)      Modalities   Modalities Teacher, English as a foreign language Location B [place]    Electrical Stimulation Action Pre-Mod    Electrical Stimulation Parameters 80-150 hz x15 min    Electrical Stimulation Goals Tone                       PT Long Term Goals - 04/21/21 1050      PT LONG TERM GOAL #1   Title Independent with a HEP.    Baseline isued 04/11/21    Time 6    Period Weeks    Status Achieved      PT LONG TERM GOAL #2   Title Increase active cervical rotation to 75 degrees+ so patient can turn head more easily while driving.    Baseline AROM RT 60 /LT 65 cervical rotation 04/21/21    Time 6    Period Weeks    Status On-going      PT LONG TERM GOAL #3   Title Perform ADL's with pain not > 3/10.    Baseline 3-4/10 04/21/21    Time 6    Period Weeks    Status Partially Met      PT LONG TERM GOAL #4   Title Pt will reported decreased overall pain to 3/10 or < on a daily basis to allow her to perform ADLs and IADLs with greater ease.    Time 4    Period Weeks    Status Achieved                 Plan - 05/04/21 1108    Clinical Impression Statement Patient presented in clinic with some discomfort but able to tolerate postural strengthening and correction. Patient noted that some days she experiences more discomfort than others and is wondering if it pertains more to thoracic issues or lower cervical issues. No complaints of any increased pain during session as well. Low grade pain reported prior to PT session today thus ilicing modalities session. Normal stimulation response noted following removal of the modalities.    Personal Factors and Comorbidities Comorbidity 1    Comorbidities Chiari Malformation Type I and  subsquent surgery (view under "Surgery" tab); PTSD.    Examination-Activity  Limitations Other    Examination-Participation Restrictions Other    Stability/Clinical Decision Making Evolving/Moderate complexity    Rehab Potential Good    PT Duration 6 weeks    PT Treatment/Interventions ADLs/Self Care Home Management;Cryotherapy;Electrical Stimulation;Ultrasound;Moist Heat;Therapeutic activities;Therapeutic exercise;Manual techniques;Patient/family education    PT Next Visit Plan progress to gentle postural and strengthening TEs to tolerance. Modalities as needed for pain relief    Consulted and Agree with Plan of Care Patient           Patient will benefit from skilled therapeutic intervention in order to improve the following deficits and impairments:  Decreased activity tolerance,Increased muscle spasms  Visit Diagnosis: Abnormal posture  Cervicalgia     Problem List Patient Active Problem List   Diagnosis Date Noted  . Vitamin D deficiency 09/11/2019  . Hyperlipidemia 09/11/2019  . Status post surgery 12/16/2018  . Nonallopathic lesion of sacral region 12/02/2018  . Palpitations 07/30/2018  . Acute bilateral low back pain without sciatica 07/19/2018  . Near syncope 06/25/2018  . Chiari I malformation ([place]) 02/13/2018  . Nonallopathic lesion of cervical region 02/13/2018  . Nonallopathic lesion of thoracic region 02/13/2018  . Nonallopathic lesion of lumbosacral region 02/13/2018  . Biomechanical lesion, unspecified 02/13/2018  . Current smoker 04/20/2017  . Headache 04/20/2017  . Encounter for initial prescription of contraceptive pills 02/21/2017  . Muscle tension headache 02/21/2017  . CIN I (cervical intraepithelial neoplasia I) 06/28/2015  . Abnormal Pap smear of cervix 07/22/2014  . HPV in female 07/22/2014  . Mild dysplasia of cervix 07/22/2014  . ASCUS with positive high risk HPV 07/08/2013  . Asthma 06/04/2013  . Depression 06/04/2013    Standley Brooking, PTA 05/04/2021, 11:55 AM  [facility] [address] [place], [place], [address] Phone: [phone]   Fax:  [phone]  Name: Spenser Cong MRN: [id number] Date of Birth: Nov 17, 1986

## 2021-05-12 ENCOUNTER — Ambulatory Visit: Payer: Medicare Other | Attending: Family Medicine | Admitting: *Deleted

## 2021-05-24 ENCOUNTER — Telehealth: Payer: Self-pay | Admitting: Neurology

## 2021-05-24 NOTE — Telephone Encounter (Signed)
Telephone call to pt, Pt get Botox at [facility] all her provider retire.

## 2021-05-26 NOTE — Telephone Encounter (Signed)
Pt advised of Dr.Jaffe note. Pt will try to calll [facility] back.

## 2021-05-27 ENCOUNTER — Other Ambulatory Visit: Payer: Medicare Other

## 2021-06-07 ENCOUNTER — Ambulatory Visit: Payer: Medicare Other | Admitting: Student in an Organized Health Care Education/Training Program

## 2021-06-08 ENCOUNTER — Telehealth: Payer: Self-pay | Admitting: Internal Medicine

## 2021-06-08 DIAGNOSIS — G8929 Other chronic pain: Secondary | ICD-10-CM

## 2021-06-08 NOTE — Telephone Encounter (Signed)
The patient needs a referral to a pain management clinic closer  The [facility] that she was referred to dismissed her from their office yesterday. She hasn't even had her first appointment there yet. She had to reschedule the first appointment because her son was shot in the eye with a [employer] gun and they spent 5 weeks in [facility]. The second appointment she called the day of and canceled because she forgot her son had a follow up appointment for his eye. So when she went yesterday she called the office from the parking lot to make sure that she was at the right place and they told her that she was at the right place and to hurry up stairs so they can get her checked in before she is late and she got up there at 12 minutes after her appointment and had to wait in line and by the time they got to check her in she was considered late and they dismissed her from the practice. She was told that she no showed 2 times before late for yesterday.   She said that this office is a 58 minute drive and would like something closer. She was wondering if she can be referred to:  [facility] [address] [place], [place] [address] [phone]

## 2021-06-08 NOTE — Telephone Encounter (Signed)
Referral placed.

## 2021-06-15 ENCOUNTER — Ambulatory Visit
Admission: RE | Admit: 2021-06-15 | Discharge: 2021-06-15 | Disposition: A | Discharge status: Home / Self Care | Payer: Medicare Other | Source: Ambulatory Visit | Attending: Internal Medicine | Admitting: Internal Medicine

## 2021-06-15 DIAGNOSIS — K760 Fatty (change of) liver, not elsewhere classified: Secondary | ICD-10-CM | POA: Diagnosis not present

## 2021-06-15 DIAGNOSIS — R1011 Right upper quadrant pain: Secondary | ICD-10-CM

## 2021-06-17 ENCOUNTER — Encounter: Payer: Self-pay | Admitting: Internal Medicine

## 2021-06-23 ENCOUNTER — Encounter: Payer: Self-pay | Admitting: Physical Medicine and Rehabilitation

## 2021-07-25 DIAGNOSIS — R079 Chest pain, unspecified: Secondary | ICD-10-CM | POA: Diagnosis not present

## 2021-08-05 ENCOUNTER — Other Ambulatory Visit: Payer: Self-pay

## 2021-08-05 ENCOUNTER — Ambulatory Visit (INDEPENDENT_AMBULATORY_CARE_PROVIDER_SITE_OTHER): Payer: Medicare Other | Admitting: Internal Medicine

## 2021-08-05 VITALS — BP 110/80 | Temp 98.1°F | Wt 181.0 lb

## 2021-08-05 DIAGNOSIS — F411 Generalized anxiety disorder: Secondary | ICD-10-CM

## 2021-08-05 MED ORDER — SERTRALINE HCL 50 MG PO TABS: 50.0000 mg | ORAL_TABLET | Freq: Every day | ORAL | 1 refills | Status: DC

## 2021-08-05 MED ORDER — ALPRAZOLAM 0.25 MG PO TABS: 0.2500 mg | ORAL_TABLET | Freq: Three times a day (TID) | ORAL | 0 refills | Status: DC | PRN

## 2021-08-05 NOTE — Progress Notes (Signed)
Established Patient Office Visit     This visit occurred during the SARS-CoV-2 public health emergency.  Safety protocols were in place, including screening questions prior to the visit, additional usage of staff PPE, and extensive cleaning of exam room while observing appropriate contact time as indicated for disinfecting solutions.    CC/Reason for Visit: Discuss anxiety  HPI: Mary [name] is a 35 y.o. female who is coming in today for the above mentioned reasons.  She was seen recently at urgent care for anxiety and prescribed Xanax.  She has had depression and anxiety for some time.  We had discussed starting on Zoloft but she decided to hold off.  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  She feels like since being on Xanax she is significantly improved.  Her husband is in with her today, he is an EMT.  He has noticed a big change in not only her anxiety but also decreased frequency in migraines.  Past Medical/Surgical History: Past Medical History:  Diagnosis Date   Asthma    Bronchitis 01/31/2016   Chiari malformation type I ([facility])    Depression    GERD (gastroesophageal reflux disease)    H/O multiple concussions    Headache(784.0)    History of palpitations    Hx of fracture of nose    Mental disorder    Multiple fractures    Hx: of a leg and arm fracture as a child   Pneumonia    PONV (postoperative nausea and vomiting)    Post traumatic stress disorder (PTSD)    Pregnant 01/21/2016   PTSD (post-traumatic stress disorder)    Sinus infection 01/31/2016   Supervision of normal pregnancy, antepartum 02/08/2016    [facility] at             13+1 week FOB   [name] 35 yo WM Dating By  LMP and [place] Pap  02/08/16 GC/CT Initial:                36+wks: Genetic Screen NT/IT:  CF screen  Anatomic [place]  Flu vaccine  Tdap Recommended ~ 28wks Glucose Screen  2 hr GBS  Feed Preference   Contraception  Circumcision  Childbirth Classes  Pediatrician      Past Surgical History:  Procedure Laterality Date   botox injections     colposcopy     DILATION AND CURETTAGE OF UTERUS     nexplanon     SUBOCCIPITAL CRANIECTOMY CERVICAL LAMINECTOMY N/A 12/16/2018   Procedure: Cervical one Laminectomy/Chiari decompression;  Surgeon: Donalee Citrin, MD;  Location: [facility];  Service: Neurosurgery;  Laterality: N/A;   WISDOM TOOTH EXTRACTION      Social History:  reports that she has been smoking cigarettes. She has been smoking an average of .5 packs per day. She has never used smokeless tobacco. She reports current alcohol use. She reports that she does not use drugs.  Allergies: Allergies  Allergen Reactions   Codeine Shortness Of Breath and Other (See Comments)    Can not take in liquid form but does not recall exactly what formulation she had Has taken vicodin and percocet without problem per pt   Peanut-Containing Drug Products Anaphylaxis   Sulfa Antibiotics Anaphylaxis and Itching   Robaxin [Methocarbamol] Other (See Comments)    Migraine    Family History:  Family History  Problem Relation Age of Onset   Cancer Mother 81  breast   Hypertension Mother    Depression Mother    Depression Father    Seizures Father    Cancer Maternal Grandmother        breast and cervical   Asthma Son    Asthma Son    Drug abuse Paternal Grandmother    Alcohol abuse Paternal Grandmother    Hypertension Other    Cancer Other      Current Outpatient Medications:    albuterol (VENTOLIN HFA) 108 (90 Base) MCG/ACT inhaler, Inhale 2 puffs into the lungs every 6 (six) hours as needed for wheezing or shortness of breath., Disp: 1 g, Rfl: 2   Cholecalciferol (VITAMIN D3) 1.25 MG (50000 UT) CAPS, One by mouth once per week., Disp: 12 capsule, Rfl: 3   Erenumab-aooe (AIMOVIG) 70 MG/ML SOAJ, Inject 70 mg into the skin every 28 (twenty-eight) days., Disp: 1 mL, Rfl: 7   fluticasone (FLONASE)  50 MCG/ACT nasal spray, Place 2 sprays into both nostrils daily., Disp: , Rfl:    montelukast (SINGULAIR) 10 MG tablet, Take 1 tablet (10 mg total) by mouth at bedtime., Disp: 30 tablet, Rfl: 1   rizatriptan (MAXALT) 10 MG tablet, Take 1 tablet earliest onset of migraine.  May repeat in 2 hours if needed.  Maximum 2 tablets in 24 hours, Disp: 10 tablet, Rfl: 3   sertraline (ZOLOFT) 50 MG tablet, Take 1 tablet (50 mg total) by mouth daily., Disp: 90 tablet, Rfl: 1   tiZANidine (ZANAFLEX) 2 MG tablet, Take 1 tablet (2 mg total) by mouth at bedtime as needed for muscle spasms., Disp: 20 tablet, Rfl: 0   ALPRAZolam (XANAX) 0.25 MG tablet, Take 1 tablet (0.25 mg total) by mouth 3 (three) times daily as needed for anxiety., Disp: 90 tablet, Rfl: 0  Review of Systems:  Constitutional: Denies fever, chills, diaphoresis, appetite change and fatigue.  HEENT: Denies photophobia, eye pain, redness, hearing loss, ear pain, congestion, sore throat, rhinorrhea, sneezing, mouth sores, trouble swallowing, neck pain, neck stiffness and tinnitus.   Respiratory: Denies SOB, DOE, cough, chest tightness,  and wheezing.   Cardiovascular: Denies chest pain, palpitations and leg swelling.  Gastrointestinal: Denies nausea, vomiting, abdominal pain, diarrhea, constipation, blood in stool and abdominal distention.  Genitourinary: Denies dysuria, urgency, frequency, hematuria, flank pain and difficulty urinating.  Endocrine: Denies: hot or cold intolerance, sweats, changes in hair or nails, polyuria, polydipsia. Musculoskeletal: Denies myalgias, back pain, joint swelling, arthralgias and gait problem.  Skin: Denies pallor, rash and wound.  Neurological: Denies dizziness, seizures, syncope, weakness, light-headedness, numbness and headaches.  Hematological: Denies adenopathy. Easy bruising, personal or family bleeding history  Psychiatric/Behavioral: Denies suicidal ideation,  confusion and agitation    Physical  Exam: Vitals:   08/05/21 1523  BP: 110/80  Temp: 98.1 F (36.7 C)  TempSrc: Oral  Weight: 181 lb (82.1 kg)    Body mass index is 31.07 kg/m.   Constitutional: NAD, calm, comfortable Eyes: PERRL, lids and conjunctivae normal ENMT: Mucous membranes are moist.  Neurologic: Grossly intact and nonfocal Psychiatric: Normal judgment and insight. Alert and oriented x 3. Normal mood.    Impression and Plan:  GAD (generalized anxiety disorder)  - Plan: sertraline (ZOLOFT) 50 MG tablet, ALPRAZolam (XANAX) 0.25 MG tablet -I have agreed to continue Xanax prescription, have however advised that we start a long-acting antianxiety medication such as Zoloft.  She agrees. -She continues to see a therapist regularly.  Time spent: 31 minutes reviewing chart, interviewing patient and husband, examining patient and  formulating plan of care.   Patient Instructions  -Nice seeing you today!!  -Start Zoloft 50 mg at bedtime.  -May use Xanax 0.25 mg up to 3 times daily as needed for anxiety, try to wean down after you start on Zoloft.  -Schedule follow up in 8 weeks.    Chaya Jan, MD [facility]

## 2021-08-05 NOTE — Patient Instructions (Signed)
-  Nice seeing you today!!  -Start Zoloft 50 mg at bedtime.  -May use Xanax 0.25 mg up to 3 times daily as needed for anxiety, try to wean down after you start on Zoloft.  -Schedule follow up in 8 weeks.

## 2021-08-18 ENCOUNTER — Telehealth: Payer: Self-pay | Admitting: Neurology

## 2021-08-18 NOTE — Telephone Encounter (Signed)
Per pt she is laying down right now But when she gets up she felt dizzy. She has no filling her right hand.   Pt states she forgot she shouldn't lift heavy things. After lifting a 29ft folding table for an event. She felt hot and dizzy. Someone to come help her checked out and to get home safe.  Pt hasn't been to her PT.    Please advise?

## 2021-08-18 NOTE — Telephone Encounter (Signed)
Pt advised to go to the ED

## 2021-08-18 NOTE — Telephone Encounter (Signed)
Pt needs a call back asap. She isnt sure if she needs to go to ER or just speak with jaffe. She was at the store with her kid and lifted up an 22ft box and all of a sudden her vision went black. She said she could think, she was alert, it just went black. She had to call for help and help to get home. She is still dizzy.

## 2021-08-20 DIAGNOSIS — R200 Anesthesia of skin: Secondary | ICD-10-CM | POA: Diagnosis not present

## 2021-08-20 DIAGNOSIS — R531 Weakness: Secondary | ICD-10-CM | POA: Diagnosis not present

## 2021-08-20 DIAGNOSIS — R202 Paresthesia of skin: Secondary | ICD-10-CM | POA: Diagnosis not present

## 2021-08-20 DIAGNOSIS — Z8669 Personal history of other diseases of the nervous system and sense organs: Secondary | ICD-10-CM | POA: Diagnosis not present

## 2021-08-20 DIAGNOSIS — G43909 Migraine, unspecified, not intractable, without status migrainosus: Secondary | ICD-10-CM | POA: Diagnosis not present

## 2021-09-21 ENCOUNTER — Other Ambulatory Visit: Payer: Self-pay | Admitting: Internal Medicine

## 2021-09-21 DIAGNOSIS — F411 Generalized anxiety disorder: Secondary | ICD-10-CM

## 2021-09-21 MED ORDER — ALPRAZOLAM 0.25 MG PO TABS: 0.2500 mg | ORAL_TABLET | Freq: Three times a day (TID) | ORAL | 0 refills | Status: DC | PRN

## 2021-10-05 ENCOUNTER — Ambulatory Visit: Payer: Medicare Other | Admitting: Physical Medicine and Rehabilitation

## 2021-10-07 ENCOUNTER — Ambulatory Visit: Payer: Medicare Other | Admitting: Physical Medicine and Rehabilitation

## 2021-10-28 LAB — HM HEPATITIS C SCREENING LAB: HM Hepatitis Screen: NEGATIVE
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
## 2021-10-31 ENCOUNTER — Ambulatory Visit (INDEPENDENT_AMBULATORY_CARE_PROVIDER_SITE_OTHER): Payer: Medicare Other | Admitting: Neurology

## 2021-10-31 ENCOUNTER — Other Ambulatory Visit: Payer: Self-pay

## 2021-10-31 ENCOUNTER — Encounter: Payer: Self-pay | Admitting: Neurology

## 2021-10-31 VITALS — BP 127/88 | HR 96 | Ht 64.0 in | Wt 179.8 lb

## 2021-10-31 DIAGNOSIS — G43109 Migraine with aura, not intractable, without status migrainosus: Secondary | ICD-10-CM | POA: Diagnosis not present

## 2021-10-31 MED ORDER — AIMOVIG 140 MG/ML ~~LOC~~ SOAJ: 140.0000 mg | SUBCUTANEOUS | 5 refills | Status: DC

## 2021-10-31 NOTE — Patient Instructions (Addendum)
Increase aimovig to 140mg  every 28 days Rizatriptan as needed for migraine attack. Limit use of pain relievers to no more than 2 days out of week to prevent risk of rebound or medication-overuse headache. Find somebody to perform Botox for blepharospasm Let me know when MRIs are done Follow up 6 months.

## 2021-11-01 ENCOUNTER — Other Ambulatory Visit: Payer: Self-pay | Admitting: Family Medicine

## 2021-11-01 ENCOUNTER — Telehealth: Payer: Self-pay

## 2021-11-01 DIAGNOSIS — F411 Generalized anxiety disorder: Secondary | ICD-10-CM

## 2021-11-01 NOTE — Telephone Encounter (Signed)
New message - website CoverMyMeds  Your information has been sent to [facility].  [name] (Key: [id number]) Rx #: [id number] Aimovig 140MG /ML auto-injectors   Form [employer] Medicare Part D Electronic Prior Authorization Form (2017 NCPDP) Created 1 day ago Sent to Plan 3 minutes ago Plan Response 3 minutes ago Submit Clinical Questions less than a minute ago Determination Wait for Determination Please wait for [employer] Medicare 2017 NCPDP to return a determination.

## 2021-11-07 ENCOUNTER — Encounter: Payer: Self-pay | Admitting: Internal Medicine

## 2021-11-07 NOTE — Telephone Encounter (Signed)
[name] (Key: [id number]) Rx #: [id number] Aimovig 140MG /ML auto-injectors   Form [employer] Medicare Part D Electronic Prior Authorization Form (2017 NCPDP) Created 7 days ago Sent to Plan 6 days ago Plan Response 6 days ago Submit Clinical Questions 6 days ago Determination Favorable 6 days ago Message from Plan Request Reference Number: [id number]. AIMOVIG INJ 140MG /ML is approved through 12/10/2022. Your patient may now fill this prescription and it will be covered.

## 2021-11-08 ENCOUNTER — Telehealth (INDEPENDENT_AMBULATORY_CARE_PROVIDER_SITE_OTHER): Payer: Medicare Other | Admitting: Internal Medicine

## 2021-11-08 ENCOUNTER — Encounter: Payer: Self-pay | Admitting: Internal Medicine

## 2021-11-08 DIAGNOSIS — F411 Generalized anxiety disorder: Secondary | ICD-10-CM | POA: Diagnosis not present

## 2021-11-08 MED ORDER — ALPRAZOLAM 0.25 MG PO TABS: 0.2500 mg | ORAL_TABLET | Freq: Three times a day (TID) | ORAL | 0 refills | Status: DC | PRN

## 2021-11-08 MED ORDER — ALPRAZOLAM 0.25 MG PO TABS: 0.2500 mg | ORAL_TABLET | Freq: Three times a day (TID) | ORAL | 1 refills | Status: DC | PRN

## 2021-11-08 NOTE — Progress Notes (Signed)
Virtual Visit via Video Note  I connected with Mary [name] on 11/08/21 at  2:30 PM EST by a video enabled telemedicine application and verified that I am speaking with the correct person using two identifiers.  Location patient: home Location provider: work office Persons participating in the virtual visit: patient, provider  I discussed the limitations of evaluation and management by telemedicine and the availability of in person appointments. The patient expressed understanding and agreed to proceed.   HPI: She has scheduled this visit for the purpose of medication refills.  She is requesting refills of Xanax 0.25 mg that she takes 3 times daily as needed.  She has not been taking Zoloft every day but has noticed a significant improvement in both the frequency of her migraines and her anxiety disorder.   ROS: Constitutional: Denies fever, chills, diaphoresis, appetite change and fatigue.  HEENT: Denies photophobia, eye pain, redness, hearing loss, ear pain, congestion, sore throat, rhinorrhea, sneezing, mouth sores, trouble swallowing, neck pain, neck stiffness and tinnitus.   Respiratory: Denies SOB, DOE, cough, chest tightness,  and wheezing.   Cardiovascular: Denies chest pain, palpitations and leg swelling.  Gastrointestinal: Denies nausea, vomiting, abdominal pain, diarrhea, constipation, blood in stool and abdominal distention.  Genitourinary: Denies dysuria, urgency, frequency, hematuria, flank pain and difficulty urinating.  Endocrine: Denies: hot or cold intolerance, sweats, changes in hair or nails, polyuria, polydipsia. Musculoskeletal: Denies myalgias, back pain, joint swelling, arthralgias and gait problem.  Skin: Denies pallor, rash and wound.  Neurological: Denies dizziness, seizures, syncope, weakness, light-headedness, numbness and headaches.  Hematological: Denies adenopathy. Easy bruising, personal or family bleeding history  Psychiatric/Behavioral: Denies  suicidal ideation, mood changes, confusion, nervousness, sleep disturbance and agitation   Past Medical History:  Diagnosis Date   Asthma    Bronchitis 01/31/2016   Chiari malformation type I (HCC)    Depression    GERD (gastroesophageal reflux disease)    H/O multiple concussions    Headache(784.0)    History of palpitations    Hx of fracture of nose    Mental disorder    Multiple fractures    Hx: of a leg and arm fracture as a child   Pneumonia    PONV (postoperative nausea and vomiting)    Post traumatic stress disorder (PTSD)    Pregnant 01/21/2016   PTSD (post-traumatic stress disorder)    Sinus infection 01/31/2016   Supervision of normal pregnancy, antepartum 02/08/2016    [facility] at             13+1 week FOB   [name] 35 yo WM Dating By  LMP and [place] Pap  02/08/16 GC/CT Initial:                36+wks: Genetic Screen NT/IT:  CF screen  Anatomic [place]  Flu vaccine  Tdap Recommended ~ 28wks Glucose Screen  2 hr GBS  Feed Preference  Contraception  Circumcision  Childbirth Classes  Pediatrician      Past Surgical History:  Procedure Laterality Date   botox injections     colposcopy     DILATION AND CURETTAGE OF UTERUS     nexplanon     SUBOCCIPITAL CRANIECTOMY CERVICAL LAMINECTOMY N/A 12/16/2018   Procedure: Cervical one Laminectomy/Chiari decompression;  Surgeon: Donalee Citrin, MD;  Location: [facility] OR;  Service: Neurosurgery;  Laterality: N/A;   WISDOM TOOTH EXTRACTION      Family History  Problem Relation Age of Onset   Cancer  Mother 74       breast   Hypertension Mother    Depression Mother    Depression Father    Seizures Father    Cancer Maternal Grandmother        breast and cervical   Asthma Son    Asthma Son    Drug abuse Paternal Grandmother    Alcohol abuse Paternal Grandmother    Hypertension Other    Cancer Other     SOCIAL HX:   reports that she has been smoking cigarettes. She has been smoking an average of .5 packs per day.  She has never used smokeless tobacco. She reports current alcohol use. She reports that she does not use drugs.   Current Outpatient Medications:    albuterol (VENTOLIN HFA) 108 (90 Base) MCG/ACT inhaler, Inhale 2 puffs into the lungs every 6 (six) hours as needed for wheezing or shortness of breath., Disp: 1 g, Rfl: 2   Cholecalciferol (VITAMIN D3) 1.25 MG (50000 UT) CAPS, One by mouth once per week., Disp: 12 capsule, Rfl: 3   Erenumab-aooe (AIMOVIG) 140 MG/ML SOAJ, Inject 140 mg into the skin every 28 (twenty-eight) days., Disp: 1.12 mL, Rfl: 5   fluticasone (FLONASE) 50 MCG/ACT nasal spray, Place 2 sprays into both nostrils daily., Disp: , Rfl:    montelukast (SINGULAIR) 10 MG tablet, Take 1 tablet (10 mg total) by mouth at bedtime., Disp: 30 tablet, Rfl: 1   rizatriptan (MAXALT) 10 MG tablet, Take 1 tablet earliest onset of migraine.  May repeat in 2 hours if needed.  Maximum 2 tablets in 24 hours, Disp: 10 tablet, Rfl: 3   sertraline (ZOLOFT) 50 MG tablet, Take 1 tablet (50 mg total) by mouth daily., Disp: 90 tablet, Rfl: 1   tiZANidine (ZANAFLEX) 2 MG tablet, Take 1 tablet (2 mg total) by mouth at bedtime as needed for muscle spasms., Disp: 20 tablet, Rfl: 0   ALPRAZolam (XANAX) 0.25 MG tablet, Take 1 tablet (0.25 mg total) by mouth 3 (three) times daily as needed for anxiety., Disp: 90 tablet, Rfl: 1  EXAM:   VITALS per patient if applicable: None reported  GENERAL: alert, oriented, appears well and in no acute distress  HEENT: atraumatic, conjunttiva clear, no obvious abnormalities on inspection of external nose and ears  NECK: normal movements of the head and neck  LUNGS: on inspection no signs of respiratory distress, breathing rate appears normal, no obvious gross increased work of breathing, gasping or wheezing  CV: no obvious cyanosis  MS: moves all visible extremities without noticeable abnormality  PSYCH/NEURO: pleasant and cooperative, no obvious depression or anxiety,  speech and thought processing grossly intact  ASSESSMENT AND PLAN:   GAD (generalized anxiety disorder)  - Plan: ALPRAZolam (XANAX) 0.25 MG tablet  Flowsheet Row Video Visit from 11/08/2021 in [facility]  PHQ-9 Total Score 3      -PDMP reviewed, no red flags, overdose risk score is 150. -Refill alprazolam 0.25 mg to take 3 tablets daily as needed for total of 90 tablets with 1 refill.     I discussed the assessment and treatment plan with the patient. The patient was provided an opportunity to ask questions and all were answered. The patient agreed with the plan and demonstrated an understanding of the instructions.   The patient was advised to call back or seek an in-person evaluation if the symptoms worsen or if the condition fails to improve as anticipated.    Lelon Frohlich, MD  [facility]

## 2021-11-08 NOTE — Telephone Encounter (Signed)
Patient has a virtual visit 11/08/21.  Refill will be discussed then.

## 2021-11-10 ENCOUNTER — Other Ambulatory Visit: Payer: Self-pay

## 2021-11-10 ENCOUNTER — Encounter: Payer: Self-pay | Admitting: Neurology

## 2021-11-10 DIAGNOSIS — H547 Unspecified visual loss: Secondary | ICD-10-CM

## 2021-11-10 NOTE — Progress Notes (Signed)
Per Dr.Jaffe, We can order MRI of brain with and without contrast to be performed at [facility] for the open-MRI.  However, it will require another prior authorization or her insurance may deny it since it was already ordered by another provider.  We won't know until we place the order.  Otherwise, she can find out if the ordering provider of that MRI would send her to an open-MRI, which shouldn't be a problem with her insurance.  Pt agreed to have the office to added the order.

## 2021-11-29 ENCOUNTER — Telehealth: Payer: Self-pay

## 2021-11-29 NOTE — Telephone Encounter (Signed)
Called patient per her MyChart message and informed her per Dr. [name] Jarvis to go to the ER for her allergic reaction and to stop Aimovig immediately. Patient stated that she took some allergy medicine and has been icing her face and her swelling has gone down a lot. Patient stated that she will seek medical attention if it starts to get any worse or if she develops any new symptoms.   Patient also stated that she is due for her next Aimovig injection in early january and wanted to let me know that if she doesn't take it she starts having stroke like symptoms. I informed patient that Dr., Everlena Cooper will return on 12/06/21 and I will send this message to him to make him aware. Patient is aware that we will call her once we hear back from Dr. Everlena Cooper. Patient understood all directions given to her from Dr. [name] Jarvis and had no further questions or concerns.

## 2021-12-06 ENCOUNTER — Telehealth: Payer: Self-pay

## 2021-12-06 MED ORDER — NORTRIPTYLINE HCL 10 MG PO CAPS: 10.0000 mg | ORAL_CAPSULE | Freq: Every day | ORAL | 0 refills | Status: DC

## 2021-12-06 NOTE — Telephone Encounter (Signed)
Responded to patient via Mychart message.

## 2021-12-06 NOTE — Telephone Encounter (Signed)
I returned patients call as she requested a call back on her Mychart message. Patient was advised per Dr. Everlena Cooper: "Please send nortriptyline 10mg  at bedtime to her pharmacy.  She may begin taking it tonight.  Many medications can potentially cause interaction side effects but we weigh risks and benefits.  Nortriptyline with sertraline may cause a condition called serotonin syndrome but this is rare and I am having her take a small dose.  This combination of medications is still used."  Patient is aware that medication will be sent to [facility] and to give a call if she has any questions or concerns.

## 2021-12-08 ENCOUNTER — Encounter: Payer: Self-pay | Admitting: Internal Medicine

## 2021-12-10 ENCOUNTER — Other Ambulatory Visit: Payer: Self-pay | Admitting: Neurology

## 2021-12-10 ENCOUNTER — Ambulatory Visit
Admission: RE | Admit: 2021-12-10 | Discharge: 2021-12-10 | Disposition: A | Discharge status: Home / Self Care | Payer: Medicare Other | Source: Ambulatory Visit | Attending: Neurology | Admitting: Neurology

## 2021-12-10 ENCOUNTER — Other Ambulatory Visit: Payer: Self-pay

## 2021-12-10 DIAGNOSIS — H547 Unspecified visual loss: Secondary | ICD-10-CM

## 2021-12-11 NOTE — L&D Delivery Note (Signed)
[facility]N [facility] Delivery Note  Mary [name] is a 36 y.o. [id number] s/p NSVD at [redacted]w[redacted]d. She was admitted for SROM and spontaneous labor.   ROM: 11h 10m with clear fluid GBS Status: Negative Maximum Maternal Temperature: 97.9  Labor Progress: Admitted in spontaneous labor. Patient received an epidural and then AROM of a forebag. However contractions spaced, so pitocin was used to augment labor.   Delivery Date/Time: 11/27/22 @1652  Delivery: Called to room and patient was complete and pushing. Head delivered LOA. No nuchal cord present. Shoulder and body delivered in usual fashion. Infant with spontaneous cry, placed on mother's abdomen, dried and stimulated. Cord clamped x 2 after 1-minute delay, and cut by FOB. Cord blood drawn. Placenta delivered spontaneously with gentle cord traction. Fundus firm with massage and Pitocin. Labia, perineum, vagina, and cervix inspected inspected with no laceration.   Placenta: complete/intact/spontaneous Complications: None  Lacerations: None EBL: 100cc Analgesia: Epidural   Postpartum Planning [x]  message to sent to schedule follow-up  [x]  vaccines UTD  Infant: Female  APGARs 8/9  weight pending  DNP, CNM  11/27/22  5:12 PM

## 2021-12-12 ENCOUNTER — Encounter: Payer: Self-pay | Admitting: Neurology

## 2021-12-13 ENCOUNTER — Other Ambulatory Visit: Payer: Self-pay | Admitting: Neurology

## 2021-12-13 MED ORDER — DIAZEPAM 5 MG PO TABS: ORAL_TABLET | ORAL | 0 refills | Status: DC

## 2021-12-21 ENCOUNTER — Other Ambulatory Visit: Payer: Medicare Other

## 2021-12-25 ENCOUNTER — Other Ambulatory Visit: Payer: Medicare Other

## 2021-12-28 ENCOUNTER — Telehealth (INDEPENDENT_AMBULATORY_CARE_PROVIDER_SITE_OTHER): Payer: Medicare Other | Admitting: Internal Medicine

## 2021-12-28 DIAGNOSIS — F339 Major depressive disorder, recurrent, unspecified: Secondary | ICD-10-CM

## 2021-12-28 DIAGNOSIS — F411 Generalized anxiety disorder: Secondary | ICD-10-CM

## 2021-12-28 DIAGNOSIS — R440 Auditory hallucinations: Secondary | ICD-10-CM | POA: Diagnosis not present

## 2021-12-28 NOTE — Progress Notes (Signed)
Virtual Visit via Video Note  I connected with Mary [name] on 12/28/21 at  1:30 PM EST by a video enabled telemedicine application and verified that I am speaking with the correct person using two identifiers.  Location patient: home Location provider: work office Persons participating in the virtual visit: patient, provider  I discussed the limitations of evaluation and management by telemedicine and the availability of in person appointments. The patient expressed understanding and agreed to proceed.   HPI: She has scheduled this visit to discuss some important changes.  We had a video consultation in November  due to significant anxiety issues we decided to start Zoloft 50 mg.  She did not start taking it until January 11.  On January 13 she woke up very agitated and fatigued.  On January 15 she had auditory hallucinations.  She tells me that she heard a voice that told her to shoot herself.  This is not her normal.  She states: "You know I have goals and I have never felt suicidal, there are things that I want to accomplish in my life and ending my life is not what I want to do".  She felt very bad that day, she did not seek help.  On the 16th she had an appointment with her her therapist who advised her to immediately discontinue Zoloft use and to contact me.  She has not had any further auditory hallucinations or suicidal thoughts.   ROS: Constitutional: Denies fever, chills, diaphoresis, appetite change and fatigue.  HEENT: Denies photophobia, eye pain, redness, hearing loss, ear pain, congestion, sore throat, rhinorrhea, sneezing, mouth sores, trouble swallowing, neck pain, neck stiffness and tinnitus.   Respiratory: Denies SOB, DOE, cough, chest tightness,  and wheezing.   Cardiovascular: Denies chest pain, palpitations and leg swelling.  Gastrointestinal: Denies nausea, vomiting, abdominal pain, diarrhea, constipation, blood in stool and abdominal distention.  Genitourinary:  Denies dysuria, urgency, frequency, hematuria, flank pain and difficulty urinating.  Endocrine: Denies: hot or cold intolerance, sweats, changes in hair or nails, polyuria, polydipsia. Musculoskeletal: Denies myalgias, back pain, joint swelling, arthralgias and gait problem.  Skin: Denies pallor, rash and wound.  Neurological: Denies dizziness, seizures, syncope, weakness, light-headedness, numbness and headaches.  Hematological: Denies adenopathy. Easy bruising, personal or family bleeding history  Psychiatric/Behavioral: Denies suicidal ideation, mood changes, confusion, nervousness, sleep disturbance and agitation   Past Medical History:  Diagnosis Date   Asthma    Bronchitis 01/31/2016   Chiari malformation type I (HCC)    Depression    GERD (gastroesophageal reflux disease)    H/O multiple concussions    Headache(784.0)    History of palpitations    Hx of fracture of nose    Mental disorder    Multiple fractures    Hx: of a leg and arm fracture as a child   Pneumonia    PONV (postoperative nausea and vomiting)    Post traumatic stress disorder (PTSD)    Pregnant 01/21/2016   PTSD (post-traumatic stress disorder)    Sinus infection 01/31/2016   Supervision of normal pregnancy, antepartum 02/08/2016    [facility] at             13+1 week FOB   [name] 36 yo WM Dating By  LMP and [place] Pap  02/08/16 GC/CT Initial:                36+wks: Genetic Screen NT/IT:  CF screen  Anatomic [place]  Flu vaccine  Tdap Recommended ~ 28wks Glucose Screen  2 hr GBS  Feed Preference  Contraception  Circumcision  Childbirth Classes  Pediatrician      Past Surgical History:  Procedure Laterality Date   botox injections     colposcopy     DILATION AND CURETTAGE OF UTERUS     nexplanon     SUBOCCIPITAL CRANIECTOMY CERVICAL LAMINECTOMY N/A 12/16/2018   Procedure: Cervical one Laminectomy/Chiari decompression;  Surgeon: Kary Kos, MD;  Location: [place];  Service: Neurosurgery;   Laterality: N/A;   WISDOM TOOTH EXTRACTION      Family History  Problem Relation Age of Onset   Cancer Mother 47       breast   Hypertension Mother    Depression Mother    Depression Father    Seizures Father    Cancer Maternal Grandmother        breast and cervical   Asthma Son    Asthma Son    Drug abuse Paternal Grandmother    Alcohol abuse Paternal Grandmother    Hypertension Other    Cancer Other     SOCIAL HX:   reports that she has been smoking cigarettes. She has been smoking an average of .5 packs per day. She has never used smokeless tobacco. She reports current alcohol use. She reports that she does not use drugs.   Current Outpatient Medications:    albuterol (VENTOLIN HFA) 108 (90 Base) MCG/ACT inhaler, Inhale 2 puffs into the lungs every 6 (six) hours as needed for wheezing or shortness of breath., Disp: 1 g, Rfl: 2   ALPRAZolam (XANAX) 0.25 MG tablet, Take 1 tablet (0.25 mg total) by mouth 3 (three) times daily as needed for anxiety., Disp: 90 tablet, Rfl: 1   Cholecalciferol (VITAMIN D3) 1.25 MG (50000 UT) CAPS, One by mouth once per week., Disp: 12 capsule, Rfl: 3   diazepam (VALIUM) 5 MG tablet, Take 1 tablet 30-45 minutes prior to MRI.  May repeat dose at MRI facility if needed., Disp: 2 tablet, Rfl: 0   Erenumab-aooe (AIMOVIG) 140 MG/ML SOAJ, Inject 140 mg into the skin every 28 (twenty-eight) days., Disp: 1.12 mL, Rfl: 5   fluticasone (FLONASE) 50 MCG/ACT nasal spray, Place 2 sprays into both nostrils daily., Disp: , Rfl:    montelukast (SINGULAIR) 10 MG tablet, Take 1 tablet (10 mg total) by mouth at bedtime., Disp: 30 tablet, Rfl: 1   nortriptyline (PAMELOR) 10 MG capsule, Take 1 capsule (10 mg total) by mouth at bedtime., Disp: 30 capsule, Rfl: 0   rizatriptan (MAXALT) 10 MG tablet, Take 1 tablet earliest onset of migraine.  May repeat in 2 hours if needed.  Maximum 2 tablets in 24 hours, Disp: 10 tablet, Rfl: 3   tiZANidine (ZANAFLEX) 2 MG tablet, Take 1  tablet (2 mg total) by mouth at bedtime as needed for muscle spasms., Disp: 20 tablet, Rfl: 0  EXAM:   VITALS per patient if applicable: None reported  GENERAL: alert, oriented, appears well and in no acute distress, she has a significant right facial tic with frequent blinking of her right eye and cheek contraction.  HEENT: atraumatic, conjunttiva clear, no obvious abnormalities on inspection of external nose and ears  NECK: normal movements of the head and neck  LUNGS: on inspection no signs of respiratory distress, breathing rate appears normal, no obvious gross increased work of breathing, gasping or wheezing  CV: no obvious cyanosis  MS: moves all visible extremities without noticeable abnormality  PSYCH/NEURO: pleasant  and cooperative, no obvious depression or anxiety, speech and thought processing grossly intact  ASSESSMENT AND PLAN:   GAD (generalized anxiety disorder)   Depression, recurrent (HCC)  Auditory hallucination -   Plan: Ambulatory referral to Psychiatry -Agree with discontinuation of Zoloft, I will place urgent psychiatry referral given her ongoing mood issues and her significant auditory hallucinations with suicidal intent.  She is not currently suicidal, she does not believe she needs to seek urgent psychiatric care.  Time spent: 30 minutes reviewing her chart, discussing with patient and formulating plan of care.     I discussed the assessment and treatment plan with the patient. The patient was provided an opportunity to ask questions and all were answered. The patient agreed with the plan and demonstrated an understanding of the instructions.   The patient was advised to call back or seek an in-person evaluation if the symptoms worsen or if the condition fails to improve as anticipated.    Lelon Frohlich, MD  [facility]

## 2022-01-13 ENCOUNTER — Encounter: Payer: Self-pay | Admitting: Neurology

## 2022-01-14 ENCOUNTER — Other Ambulatory Visit: Payer: Medicare Other

## 2022-02-16 ENCOUNTER — Encounter: Payer: Self-pay | Admitting: Internal Medicine

## 2022-02-28 ENCOUNTER — Encounter: Payer: Self-pay | Admitting: Internal Medicine

## 2022-02-28 DIAGNOSIS — F411 Generalized anxiety disorder: Secondary | ICD-10-CM

## 2022-02-28 MED ORDER — ALPRAZOLAM 0.25 MG PO TABS: 0.2500 mg | ORAL_TABLET | Freq: Three times a day (TID) | ORAL | 1 refills | Status: DC | PRN

## 2022-03-06 ENCOUNTER — Other Ambulatory Visit: Payer: Self-pay | Admitting: *Deleted

## 2022-03-06 DIAGNOSIS — F411 Generalized anxiety disorder: Secondary | ICD-10-CM

## 2022-03-06 NOTE — Telephone Encounter (Signed)
Patient is calling because: ?ALPRAZolam (XANAX) 0.25 MG tablet ? ?Is on back order at [facility] and [facility].  Patient would like to know if the prescription can be alprazolam 0.5 mg take half tab 3 times a day. ? ?I spoke with the pharmacist at [facility] and the prescription is on back order.   ?

## 2022-03-07 MED ORDER — ALPRAZOLAM 0.5 MG PO TABS: 0.2500 mg | ORAL_TABLET | Freq: Three times a day (TID) | ORAL | 0 refills | Status: DC | PRN

## 2022-03-07 NOTE — Telephone Encounter (Signed)
Rx sent 

## 2022-04-13 ENCOUNTER — Encounter: Payer: Self-pay | Admitting: Internal Medicine

## 2022-04-17 ENCOUNTER — Other Ambulatory Visit: Payer: Self-pay | Admitting: Neurology

## 2022-04-17 ENCOUNTER — Encounter: Payer: Self-pay | Admitting: Neurology

## 2022-04-19 ENCOUNTER — Ambulatory Visit: Payer: Medicare Other | Admitting: Adult Health

## 2022-04-25 ENCOUNTER — Encounter: Payer: Self-pay | Admitting: Adult Health

## 2022-04-25 ENCOUNTER — Ambulatory Visit (INDEPENDENT_AMBULATORY_CARE_PROVIDER_SITE_OTHER): Payer: Medicare Other | Admitting: Adult Health

## 2022-04-25 VITALS — BP 131/89 | HR 104 | Ht 64.0 in | Wt 172.0 lb

## 2022-04-25 DIAGNOSIS — O3680X0 Pregnancy with inconclusive fetal viability, not applicable or unspecified: Secondary | ICD-10-CM | POA: Insufficient documentation

## 2022-04-25 DIAGNOSIS — O09521 Supervision of elderly multigravida, first trimester: Secondary | ICD-10-CM | POA: Insufficient documentation

## 2022-04-25 DIAGNOSIS — Z8659 Personal history of other mental and behavioral disorders: Secondary | ICD-10-CM | POA: Insufficient documentation

## 2022-04-25 DIAGNOSIS — Z3200 Encounter for pregnancy test, result unknown: Secondary | ICD-10-CM | POA: Diagnosis not present

## 2022-04-25 DIAGNOSIS — G935 Compression of brain: Secondary | ICD-10-CM

## 2022-04-25 DIAGNOSIS — Z3A01 Less than 8 weeks gestation of pregnancy: Secondary | ICD-10-CM | POA: Diagnosis not present

## 2022-04-25 DIAGNOSIS — F17200 Nicotine dependence, unspecified, uncomplicated: Secondary | ICD-10-CM | POA: Diagnosis not present

## 2022-04-25 LAB — POCT URINE PREGNANCY: Preg Test, Ur: POSITIVE — AB

## 2022-04-25 MED ORDER — PRENATAL PLUS 27-1 MG PO TABS: 1.0000 | ORAL_TABLET | Freq: Every day | ORAL | 12 refills | Status: DC

## 2022-04-25 MED ORDER — HYDROXYZINE PAMOATE 25 MG PO CAPS: 25.0000 mg | ORAL_CAPSULE | Freq: Three times a day (TID) | ORAL | 3 refills | Status: DC | PRN
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
## 2022-05-04 ENCOUNTER — Other Ambulatory Visit: Payer: Self-pay | Admitting: Internal Medicine

## 2022-05-04 MED ORDER — ALPRAZOLAM 0.5 MG PO TABS: 0.2500 mg | ORAL_TABLET | Freq: Three times a day (TID) | ORAL | 0 refills | Status: DC | PRN

## 2022-05-16 ENCOUNTER — Ambulatory Visit (INDEPENDENT_AMBULATORY_CARE_PROVIDER_SITE_OTHER): Payer: Medicare Other

## 2022-05-16 DIAGNOSIS — Z3A11 11 weeks gestation of pregnancy: Secondary | ICD-10-CM

## 2022-05-16 DIAGNOSIS — O3680X0 Pregnancy with inconclusive fetal viability, not applicable or unspecified: Secondary | ICD-10-CM | POA: Diagnosis not present

## 2022-05-16 NOTE — Progress Notes (Addendum)
[place] 11+2 wks,single IUP,FHR 169 bpm,anterior placenta,CRL 47.41 mm

## 2022-05-17 ENCOUNTER — Telehealth (INDEPENDENT_AMBULATORY_CARE_PROVIDER_SITE_OTHER): Payer: Medicare Other

## 2022-05-17 DIAGNOSIS — O0990 Supervision of high risk pregnancy, unspecified, unspecified trimester: Secondary | ICD-10-CM | POA: Insufficient documentation

## 2022-05-17 DIAGNOSIS — O09521 Supervision of elderly multigravida, first trimester: Secondary | ICD-10-CM | POA: Insufficient documentation

## 2022-05-17 DIAGNOSIS — O09529 Supervision of elderly multigravida, unspecified trimester: Secondary | ICD-10-CM

## 2022-05-17 DIAGNOSIS — Z3A00 Weeks of gestation of pregnancy not specified: Secondary | ICD-10-CM

## 2022-05-17 DIAGNOSIS — O0940 Supervision of pregnancy with grand multiparity, unspecified trimester: Secondary | ICD-10-CM

## 2022-05-17 NOTE — Progress Notes (Signed)
New OB Intake  I connected with  [name] on 05/17/22 at 10:15 AM EDT by MyChart Video Visit and verified that I am speaking with the correct person using two identifiers. Nurse is located at [facility] and pt is located at [facility].  I discussed the limitations, risks, security and privacy concerns of performing an evaluation and management service by telephone and the availability of in person appointments. I also discussed with the patient that there may be a patient responsible charge related to this service. The patient expressed understanding and agreed to proceed.  I explained I am completing New OB Intake today. We discussed her EDD of 12/03/22 that is based on LMP of 02/26/22. Pt is G6/P4. I reviewed her allergies, medications, Medical/Surgical/OB history, and appropriate screenings. I informed her of [facility] services. Based on history, this is a/an  pregnancy complicated by ama  .   Patient Active Problem List   Diagnosis Date Noted   Advanced maternal age in multigravida, first trimester 04/25/2022   Smoker 04/25/2022   History of posttraumatic stress disorder (PTSD) 04/25/2022   Encounter to determine fetal viability of pregnancy 04/25/2022   Possible pregnancy 04/25/2022   Less than [redacted] weeks gestation of pregnancy 04/25/2022   Vitamin D deficiency 09/11/2019   Hyperlipidemia 09/11/2019   Status post surgery 12/16/2018   Nonallopathic lesion of sacral region 12/02/2018   Palpitations 07/30/2018   Acute bilateral low back pain without sciatica 07/19/2018   Near syncope 06/25/2018   Chiari I malformation (HCC) 02/13/2018   Nonallopathic lesion of cervical region 02/13/2018   Nonallopathic lesion of thoracic region 02/13/2018   Nonallopathic lesion of lumbosacral region 02/13/2018   Biomechanical lesion, unspecified 02/13/2018   Current smoker 04/20/2017   Headache 04/20/2017   Encounter for initial prescription of contraceptive pills 02/21/2017   Muscle tension headache  02/21/2017   CIN I (cervical intraepithelial neoplasia I) 06/28/2015   Abnormal Pap smear of cervix 07/22/2014   HPV in female 07/22/2014   Mild dysplasia of cervix 07/22/2014   ASCUS with positive high risk HPV 07/08/2013   Asthma 06/04/2013   Depression 06/04/2013    Concerns addressed today  Delivery Plans:  Plans to deliver at [facility].   MyChart/Babyscripts MyChart access verified. I explained pt will have some visits in office and some virtually. Babyscripts instructions given and order placed. Patient verifies receipt of registration text/e-mail. Account successfully created and app downloaded.  Blood Pressure Cuff  Pt has her own BP Cuff, Explained after first prenatal appt pt will check weekly and document in Babyscripts.  Weight scale: Patient does / does not  have weight scale. Weight scale ordered for patient to pick up from [employer].   Anatomy US Explained first scheduled [place] will be around 19 weeks. Anatomy US scheduled for 07/10/22 at 0100. Pt notified to arrive at 1245. Scheduled AFP lab only appointment if CenteringPregnancy pt for same day as anatomy US.   Labs Discussed Avelina Laine genetic screening with patient. Would like both Panorama and Horizon drawn at new OB visit.Also if interested in genetic testing, tell patient she will need AFP 15-21 weeks to complete genetic testing .Routine prenatal labs needed.  Covid Vaccine Patient has not covid vaccine.   Is patient a CenteringPregnancy candidate?  Not a Candidate Declined due to NA Not a candidate due to Uncontrolled BH Centering Patient" indicated on sticky note   Is patient a Mom+Baby Combined Care candidate?  Not a candidate    Scheduled with Mom+Baby  provider    Is patient interested in [place]?  No   "Interested in [employer] - Schedule next visit with CNM" on sticky note  Informed patient of [facility] website  and placed link in her AVS.   Social Determinants of Health Food Insecurity:  Patient denies food insecurity. WIC Referral: Patient is interested in [facility] to [facility].  Transportation: Patient denies transportation needs. Childcare: Discussed no children allowed at ultrasound appointments. Offered childcare services; patient declines childcare services at this time.  Send link to Pregnancy Navigators   Placed OB Box on problem list and updated  First visit review I reviewed new OB appt with pt. I explained she will have a pelvic exam, ob bloodwork with genetic screening, and PAP smear. Explained pt will be seen by Dr. Shawnie Pons at first visit; encounter routed to appropriate provider. Explained that patient will be seen by pregnancy navigator following visit with provider. [facility] information placed in AVS.   Henrietta Dine, CMA 05/17/2022  10:17 AM

## 2022-05-17 NOTE — Progress Notes (Signed)
Pt is concerned that if she is taken off her Anxiety meds Xanax 0.5 mg, that it will cause more harm than good.  Pt initially was going to go to [facility] for her Prenatal care but changed her mind, I advise that she is still showing an appointment on 05/23/22 & that she will need to cancel. Pt verbalized understanding.

## 2022-05-23 ENCOUNTER — Encounter: Payer: Medicare Other | Admitting: Women's Health

## 2022-05-23 ENCOUNTER — Ambulatory Visit: Payer: Medicare Other | Admitting: *Deleted

## 2022-06-06 ENCOUNTER — Telehealth: Payer: Self-pay | Admitting: General Practice

## 2022-06-14 ENCOUNTER — Telehealth: Payer: Self-pay | Admitting: General Practice

## 2022-06-14 NOTE — Telephone Encounter (Signed)
Called patient regarding several elevated blood pressures in babyscripts and to discuss mychart message. Patient states she has been feeling super anxious lately and has had a lot going on lately too. Patient reports weird cringing pains that only occur in the morning. Phone call was then disconnected due to signal issues. Called patient back 3 other times but call would drop quickly into the conversation. Called patient and asked if she could come for a nurse visit appt tomorrow at 315 to check her blood pressure cuff and bring her cuff with her. Told patient we can talk about her questions and concerns then while she is here. Patient verbalized understanding and will be here then.
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
## 2022-06-15 ENCOUNTER — Encounter: Payer: Self-pay | Admitting: Neurology

## 2022-06-15 ENCOUNTER — Ambulatory Visit (INDEPENDENT_AMBULATORY_CARE_PROVIDER_SITE_OTHER): Payer: Medicare Other

## 2022-06-15 ENCOUNTER — Telehealth (INDEPENDENT_AMBULATORY_CARE_PROVIDER_SITE_OTHER): Payer: Medicare Other | Admitting: Neurology

## 2022-06-15 ENCOUNTER — Other Ambulatory Visit: Payer: Self-pay

## 2022-06-15 VITALS — Ht 64.0 in | Wt 165.0 lb

## 2022-06-15 VITALS — BP 101/75 | HR 98

## 2022-06-15 DIAGNOSIS — Z8669 Personal history of other diseases of the nervous system and sense organs: Secondary | ICD-10-CM | POA: Diagnosis not present

## 2022-06-15 DIAGNOSIS — Z3A15 15 weeks gestation of pregnancy: Secondary | ICD-10-CM | POA: Diagnosis not present

## 2022-06-15 DIAGNOSIS — G43109 Migraine with aura, not intractable, without status migrainosus: Secondary | ICD-10-CM | POA: Diagnosis not present

## 2022-06-15 DIAGNOSIS — Z0130 Encounter for examination of blood pressure without abnormal findings: Secondary | ICD-10-CM

## 2022-06-15 DIAGNOSIS — O219 Vomiting of pregnancy, unspecified: Secondary | ICD-10-CM

## 2022-06-15 MED ORDER — ONDANSETRON 4 MG PO TBDP: 4.0000 mg | ORAL_TABLET | Freq: Three times a day (TID) | ORAL | 1 refills | Status: DC | PRN

## 2022-06-15 NOTE — Progress Notes (Signed)
Blood Pressure Check Visit  Mary [name] is here for blood pressure check at 106w4d of pregnancy. Patient denies any s/s of elevated BP. BP today is 101/75. Home wrist cuff systolic is 20 points higher. Pt given cuff from office stock. Will continue to check BP at home with any symptoms or weekly and log in [employer]. Reports nausea and vomiting. Has been unable to eat or drink regularly. States Zofran has improved nausea in the past. Verbal order given by Shawnie Pons MD for 4 mg ODT Zofran tablets.  Discussed increased social stressors within past few months. Pt reports she participates in weekly therapy for diagnosed anxiety, depression, and PTSD. Has taken Xanax for past year. Attempted to wean herself off of this medicine when she found out she was pregnant, but began to feel very poorly. Meds managed by PCP. Currently waiting for psych new pt appt. Pt expresses she is sole caregiver for 36 year old. Does not have support system in place that can provide childcare. Will need childcare for anatomy US.   Offered Centering Pregnancy to patient for group support. Pt is very excited about opportunity to have prenatal care in a group setting. Appts scheduled and group facilitators notified. Pt will need childcare for July session. Escorted to food market prior to check out.   Marjo Bicker, RN 06/15/2022  2:54 PM

## 2022-06-15 NOTE — Patient Instructions (Addendum)
   [facility]  [employer] # [phone]

## 2022-06-19 ENCOUNTER — Telehealth: Payer: Self-pay | Admitting: General Practice

## 2022-06-19 NOTE — Telephone Encounter (Signed)
Called patient and confirmed centering appt date information. Patient verbalized understanding and reports difficulty with child care. Discussed [facility] was set up for her first centering appt already so she doesn't have to worry about that. Patient verbalized understanding. Confirmed ultrasound appt date and patient states she thought it was the 31st. Told patient it looks like the appt was changed because [facility] was not going to be open on the 31st at 730. Patient states she'll have to figure something else out because the 31st worked with her family to come to the ultrasound. Provided ultrasound's phone number to patient. Patient verbalized understanding & had no other questions.

## 2022-06-22 ENCOUNTER — Other Ambulatory Visit (HOSPITAL_COMMUNITY)
Admission: RE | Admit: 2022-06-22 | Discharge: 2022-06-22 | Disposition: A | Discharge status: Home / Self Care | Payer: Medicare Other | Source: Ambulatory Visit | Attending: Family Medicine | Admitting: Family Medicine

## 2022-06-22 ENCOUNTER — Other Ambulatory Visit: Payer: Self-pay

## 2022-06-22 ENCOUNTER — Ambulatory Visit (INDEPENDENT_AMBULATORY_CARE_PROVIDER_SITE_OTHER): Payer: Medicare Other | Admitting: Family Medicine

## 2022-06-22 ENCOUNTER — Encounter: Payer: Self-pay | Admitting: Family Medicine

## 2022-06-22 VITALS — BP 113/79 | HR 97 | Wt 161.0 lb

## 2022-06-22 DIAGNOSIS — G935 Compression of brain: Secondary | ICD-10-CM

## 2022-06-22 DIAGNOSIS — Z3A17 17 weeks gestation of pregnancy: Secondary | ICD-10-CM | POA: Diagnosis not present

## 2022-06-22 DIAGNOSIS — O09521 Supervision of elderly multigravida, first trimester: Secondary | ICD-10-CM

## 2022-06-22 DIAGNOSIS — O0990 Supervision of high risk pregnancy, unspecified, unspecified trimester: Secondary | ICD-10-CM | POA: Diagnosis present

## 2022-06-22 DIAGNOSIS — O99352 Diseases of the nervous system complicating pregnancy, second trimester: Secondary | ICD-10-CM

## 2022-06-22 DIAGNOSIS — O09522 Supervision of elderly multigravida, second trimester: Secondary | ICD-10-CM | POA: Diagnosis not present

## 2022-06-22 DIAGNOSIS — O0991 Supervision of high risk pregnancy, unspecified, first trimester: Secondary | ICD-10-CM

## 2022-06-23 ENCOUNTER — Encounter: Payer: Self-pay | Admitting: Family Medicine

## 2022-06-23 LAB — GC/CHLAMYDIA PROBE AMP (~~LOC~~) NOT AT ARMC
Chlamydia: NEGATIVE
Comment: NEGATIVE
Comment: NORMAL
Neisseria Gonorrhea: NEGATIVE

## 2022-06-23 NOTE — Progress Notes (Addendum)
Subjective:   Mary [name] is a 36 y.o. [id number] at [redacted]w[redacted]d by LMP, early ultrasound being seen today for her first obstetrical visit.  Her obstetrical history is not significant. Pregnancy history fully reviewed.  Patient reports no complaints.  HISTORY: OB History  Gravida Para Term Preterm AB Living  6 4 4  0 1 4  SAB IAB Ectopic Multiple Live Births  1 0 0 0 4    # Outcome Date GA Lbr Len/2nd Weight Sex Delivery Anes PTL Lv  6 Current           5 Term 08/13/16 [redacted]w[redacted]d 03:32 / 00:03 8 lb 12.2 oz (3.975 kg) M Vag-Spont None  LIV     Birth Comments: facial bruising     Name: Stephens,BOY Piccola     Apgar1: 8  Apgar5: 9  4 Term 01/04/09 [redacted]w[redacted]d  8 lb (3.629 kg) M Vag-Spont   LIV  3 Term 09/25/06 [redacted]w[redacted]d  7 lb 8 oz (3.402 kg) M Vag-Spont   LIV  2 SAB 2006          1 Term 10/16/02 [redacted]w[redacted]d  8 lb 6 oz (3.799 kg) M Vag-Spont EPI  LIV   Past Medical History:  Diagnosis Date   Asthma    Bronchitis 01/31/2016   Chiari malformation type I ([place])    Depression    GERD (gastroesophageal reflux disease)    H/O multiple concussions    Headache(784.0)    History of palpitations    Hx of fracture of nose    Mental disorder    Multiple fractures    Hx: of a leg and arm fracture as a child   Pneumonia    PONV (postoperative nausea and vomiting)    Post traumatic stress disorder (PTSD)    Pregnant 01/21/2016   PTSD (post-traumatic stress disorder)    Sinus infection 01/31/2016   Supervision of normal pregnancy, antepartum 02/08/2016    [facility] Care at             13+1 week FOB   Ventura Sellers 36 yo WM Dating By  LMP and [place] Pap  02/08/16 GC/CT Initial:                36+wks: Genetic Screen NT/IT:  CF screen  Anatomic [place]  Flu vaccine  Tdap Recommended ~ 28wks Glucose Screen  2 hr GBS  Feed Preference  Contraception  Circumcision  Childbirth Classes  Pediatrician     Past Surgical History:  Procedure Laterality Date   botox injections     colposcopy     DILATION AND CURETTAGE  OF UTERUS     nexplanon     SUBOCCIPITAL CRANIECTOMY CERVICAL LAMINECTOMY N/A 12/16/2018   Procedure: Cervical one Laminectomy/Chiari decompression;  Surgeon: Kary Kos, MD;  Location: [place];  Service: Neurosurgery;  Laterality: N/A;   WISDOM TOOTH EXTRACTION     Family History  Problem Relation Age of Onset   Cancer Mother 62       breast   Hypertension Mother    Depression Mother    Depression Father    Seizures Father    Cancer Maternal Grandmother        breast and cervical   Asthma Son    Asthma Son    Drug abuse Paternal Grandmother    Alcohol abuse Paternal Grandmother    Hypertension Other    Cancer Other    Social History   Tobacco Use   Smoking status:  Every Day    Packs/day: 0.50    Types: Cigarettes   Smokeless tobacco: Never   Tobacco comments:    NEVER USE SNUFF OR CHEWING TOBACCO  Vaping Use   Vaping Use: Never used  Substance Use Topics   Alcohol use: Yes    Comment: social   Drug use: No   Allergies  Allergen Reactions   Codeine Shortness Of Breath and Other (See Comments)    Can not take in liquid form but does not recall exactly what formulation she had Has taken vicodin and percocet without problem per pt   Peanut-Containing Drug Products Anaphylaxis   Sulfa Antibiotics Anaphylaxis and Itching   Robaxin [Methocarbamol] Other (See Comments)    Migraine   Current Outpatient Medications on File Prior to Visit  Medication Sig Dispense Refill   albuterol (VENTOLIN HFA) 108 (90 Base) MCG/ACT inhaler Inhale 2 puffs into the lungs every 6 (six) hours as needed for wheezing or shortness of breath. 1 g 2   ALPRAZolam (XANAX) 0.5 MG tablet Take 0.5 tablets (0.25 mg total) by mouth 3 (three) times daily as needed for anxiety. 90 tablet 0   fluticasone (FLONASE) 50 MCG/ACT nasal spray Place 2 sprays into both nostrils daily.     montelukast (SINGULAIR) 10 MG tablet Take 1 tablet (10 mg total) by mouth at bedtime. 30 tablet 1   ondansetron (ZOFRAN-ODT) 4 MG  disintegrating tablet Take 1 tablet (4 mg total) by mouth every 8 (eight) hours as needed for nausea or vomiting. 30 tablet 1   prenatal vitamin w/FE, FA (PRENATAL 1 + 1) 27-1 MG TABS tablet Take 1 tablet by mouth daily at 12 noon. 30 tablet 12   No current facility-administered medications on file prior to visit.     Exam   Vitals:   06/22/22 1554  BP: 113/79  Pulse: 97  Weight: 161 lb (73 kg)   Fetal Heart Rate (bpm): 157  System: General: well-developed, well-nourished female in no acute distress   Skin: normal coloration and turgor, no rashes   Neurologic: oriented, normal, negative, normal mood   Extremities: normal strength, tone, and muscle mass, ROM of all joints is normal   HEENT PERRLA, extraocular movement intact and sclera clear, anicteric   Mouth/Teeth mucous membranes moist, pharynx normal without lesions and dental hygiene good   Neck supple and no masses   Cardiovascular: regular rate and rhythm   Respiratory:  no respiratory distress, normal breath sounds   Abdomen: soft, non-tender; bowel sounds normal; no masses,  no organomegaly     Assessment:   Pregnancy: [id number] Patient Active Problem List   Diagnosis Date Noted   AMA (advanced maternal age) multigravida 35+, first trimester 05/17/2022   Supervision of high risk pregnancy, antepartum 05/17/2022   History of posttraumatic stress disorder (PTSD) 04/25/2022   Vitamin D deficiency 09/11/2019   Hyperlipidemia 09/11/2019   Palpitations 07/30/2018   Acute bilateral low back pain without sciatica 07/19/2018   Chiari I malformation (HCC) 02/13/2018   Current smoker 04/20/2017   CIN I (cervical intraepithelial neoplasia I) 06/28/2015   ASCUS with positive high risk HPV 07/08/2013   Asthma 06/04/2013   Depression 06/04/2013     Plan:  1. Supervision of high risk pregnancy, antepartum - CBC/D/Plt+RPR+Rh+ABO+RubIgG... - HgB A1c - Culture, OB Urine - Panorama Prenatal Test Full Panel - HORIZON CUSTOM -  AFP, Serum, Open Spina Bifida - GC/Chlamydia probe amp ([place])not at [facility]  2. AMA (advanced maternal age) multigravida 35+, first  trimester NIPT  3. Chiari I malformation (HCC) Per her neuro-->ok to push  Initial labs drawn. Continue prenatal vitamins. Genetic Screening discussed, NIPS: ordered. Ultrasound discussed; fetal anatomic survey: ordered. Problem list reviewed and updated. The nature of [facility] with multiple MDs and other Advanced Practice Providers was explained to patient; also emphasized that residents, students are part of our team. Routine obstetric precautions reviewed. Return in 4 weeks (on 07/20/2022).

## 2022-06-24 LAB — AFP, SERUM, OPEN SPINA BIFIDA
AFP MoM: 0.85
AFP Value: 29.6 ng/mL
Gest. Age on Collection Date: 16.4 weeks
Maternal Age At EDD: 36 yr
OSBR Risk 1 IN: 10000
Test Results:: NEGATIVE
Weight: 161 [lb_av]

## 2022-06-24 LAB — URINE CULTURE, OB REFLEX

## 2022-06-24 LAB — CBC/D/PLT+RPR+RH+ABO+RUBIGG...
Antibody Screen: NEGATIVE
Basophils Absolute: 0 10*3/uL (ref 0.0–0.2)
Basos: 0 %
EOS (ABSOLUTE): 0.2 10*3/uL (ref 0.0–0.4)
Eos: 3 %
HCV Ab: NONREACTIVE
HIV Screen 4th Generation wRfx: NONREACTIVE
Hematocrit: 35.6 % (ref 34.0–46.6)
Hemoglobin: 11.5 g/dL (ref 11.1–15.9)
Hepatitis B Surface Ag: NEGATIVE
Immature Grans (Abs): 0 10*3/uL (ref 0.0–0.1)
Immature Granulocytes: 0 %
Lymphocytes Absolute: 1.6 10*3/uL (ref 0.7–3.1)
Lymphs: 23 %
MCH: 28.5 pg (ref 26.6–33.0)
MCHC: 32.3 g/dL (ref 31.5–35.7)
MCV: 88 fL (ref 79–97)
Monocytes Absolute: 0.5 10*3/uL (ref 0.1–0.9)
Monocytes: 7 %
Neutrophils Absolute: 4.6 10*3/uL (ref 1.4–7.0)
Neutrophils: 67 %
Platelets: 232 10*3/uL (ref 150–450)
RBC: 4.04 x10E6/uL (ref 3.77–5.28)
RDW: 14.6 % (ref 11.7–15.4)
RPR Ser Ql: NONREACTIVE
Rh Factor: POSITIVE
Rubella Antibodies, IGG: 2.83 index (ref >0.99)
WBC: 6.9 10*3/uL (ref 3.4–10.8)

## 2022-06-24 LAB — CULTURE, OB URINE

## 2022-06-24 LAB — HCV INTERPRETATION

## 2022-06-24 LAB — HEMOGLOBIN A1C
Est. average glucose Bld gHb Est-mCnc: 108 mg/dL
Hgb A1c MFr Bld: 5.4 % (ref 4.8–5.6)

## 2022-06-25 ENCOUNTER — Other Ambulatory Visit: Payer: Self-pay | Admitting: Internal Medicine

## 2022-06-26 ENCOUNTER — Encounter: Payer: Self-pay | Admitting: *Deleted

## 2022-06-26 MED ORDER — ALPRAZOLAM 0.5 MG PO TABS
0.2500 mg | ORAL_TABLET | Freq: Three times a day (TID) | ORAL | 0 refills | Status: DC | PRN
Start: 2022-06-26 — End: 2022-07-24

## 2022-06-26 NOTE — Telephone Encounter (Signed)
I refilled #30 until patient's primary returns

## 2022-06-30 ENCOUNTER — Encounter: Payer: Self-pay | Admitting: Neurology

## 2022-07-01 LAB — PANORAMA PRENATAL TEST FULL PANEL:PANORAMA TEST PLUS 5 ADDITIONAL MICRODELETIONS: FETAL FRACTION: 7.9

## 2022-07-10 ENCOUNTER — Ambulatory Visit: Payer: Medicare Other | Attending: Obstetrics and Gynecology | Admitting: *Deleted

## 2022-07-10 ENCOUNTER — Encounter: Payer: Self-pay | Admitting: *Deleted

## 2022-07-10 ENCOUNTER — Ambulatory Visit: Payer: Medicare Other

## 2022-07-10 ENCOUNTER — Other Ambulatory Visit: Payer: Medicare Other

## 2022-07-10 ENCOUNTER — Other Ambulatory Visit: Payer: Self-pay | Admitting: *Deleted

## 2022-07-10 ENCOUNTER — Ambulatory Visit (HOSPITAL_BASED_OUTPATIENT_CLINIC_OR_DEPARTMENT_OTHER): Payer: Medicare Other

## 2022-07-10 ENCOUNTER — Other Ambulatory Visit: Payer: Self-pay

## 2022-07-10 VITALS — BP 118/78 | HR 96

## 2022-07-10 DIAGNOSIS — O99512 Diseases of the respiratory system complicating pregnancy, second trimester: Secondary | ICD-10-CM | POA: Insufficient documentation

## 2022-07-10 DIAGNOSIS — Z363 Encounter for antenatal screening for malformations: Secondary | ICD-10-CM | POA: Diagnosis present

## 2022-07-10 DIAGNOSIS — Z3A19 19 weeks gestation of pregnancy: Secondary | ICD-10-CM | POA: Insufficient documentation

## 2022-07-10 DIAGNOSIS — O09521 Supervision of elderly multigravida, first trimester: Secondary | ICD-10-CM | POA: Diagnosis not present

## 2022-07-10 DIAGNOSIS — J45909 Unspecified asthma, uncomplicated: Secondary | ICD-10-CM | POA: Insufficient documentation

## 2022-07-10 DIAGNOSIS — O09522 Supervision of elderly multigravida, second trimester: Secondary | ICD-10-CM | POA: Diagnosis not present

## 2022-07-10 DIAGNOSIS — O0990 Supervision of high risk pregnancy, unspecified, unspecified trimester: Secondary | ICD-10-CM

## 2022-07-10 DIAGNOSIS — O09523 Supervision of elderly multigravida, third trimester: Secondary | ICD-10-CM

## 2022-07-10 DIAGNOSIS — O99352 Diseases of the nervous system complicating pregnancy, second trimester: Secondary | ICD-10-CM | POA: Insufficient documentation

## 2022-07-10 LAB — HORIZON CUSTOM: REPORT SUMMARY: NEGATIVE

## 2022-07-11 ENCOUNTER — Ambulatory Visit: Payer: Medicare Other

## 2022-07-11 ENCOUNTER — Other Ambulatory Visit: Payer: Medicare Other

## 2022-07-18 ENCOUNTER — Telehealth: Payer: Self-pay | Admitting: General Practice

## 2022-07-18 NOTE — Telephone Encounter (Signed)
Called patient and reminded her of centering appt next week on 8/15. Discussed to come alone and that group will start at 9 and end at 68. Discussed several other people in the group also missed the first session so she will not be alone in this being her first one. Patient verbalized understanding and states she will be there. Advised patient to call us back if something comes up between now & then.

## 2022-07-19 ENCOUNTER — Encounter (HOSPITAL_COMMUNITY): Payer: Self-pay | Admitting: Obstetrics & Gynecology

## 2022-07-19 ENCOUNTER — Encounter: Payer: Self-pay | Admitting: General Practice

## 2022-07-19 ENCOUNTER — Telehealth: Payer: Self-pay | Admitting: Neurology

## 2022-07-19 ENCOUNTER — Inpatient Hospital Stay (HOSPITAL_COMMUNITY)
Admission: AD | Admit: 2022-07-19 | Discharge: 2022-07-19 | Disposition: A | Discharge status: Home / Self Care | Payer: Medicare Other | Attending: Obstetrics & Gynecology | Admitting: Obstetrics & Gynecology

## 2022-07-19 ENCOUNTER — Encounter: Payer: Self-pay | Admitting: Family Medicine

## 2022-07-19 DIAGNOSIS — O26892 Other specified pregnancy related conditions, second trimester: Secondary | ICD-10-CM | POA: Diagnosis not present

## 2022-07-19 DIAGNOSIS — M542 Cervicalgia: Secondary | ICD-10-CM | POA: Insufficient documentation

## 2022-07-19 DIAGNOSIS — F32A Depression, unspecified: Secondary | ICD-10-CM | POA: Diagnosis not present

## 2022-07-19 DIAGNOSIS — M549 Dorsalgia, unspecified: Secondary | ICD-10-CM | POA: Diagnosis not present

## 2022-07-19 DIAGNOSIS — O99352 Diseases of the nervous system complicating pregnancy, second trimester: Secondary | ICD-10-CM | POA: Diagnosis present

## 2022-07-19 DIAGNOSIS — Z3A20 20 weeks gestation of pregnancy: Secondary | ICD-10-CM | POA: Diagnosis not present

## 2022-07-19 DIAGNOSIS — J45909 Unspecified asthma, uncomplicated: Secondary | ICD-10-CM | POA: Insufficient documentation

## 2022-07-19 DIAGNOSIS — G44209 Tension-type headache, unspecified, not intractable: Secondary | ICD-10-CM

## 2022-07-19 DIAGNOSIS — Y929 Unspecified place or not applicable: Secondary | ICD-10-CM | POA: Insufficient documentation

## 2022-07-19 DIAGNOSIS — O99342 Other mental disorders complicating pregnancy, second trimester: Secondary | ICD-10-CM | POA: Insufficient documentation

## 2022-07-19 DIAGNOSIS — O0990 Supervision of high risk pregnancy, unspecified, unspecified trimester: Secondary | ICD-10-CM

## 2022-07-19 DIAGNOSIS — Y939 Activity, unspecified: Secondary | ICD-10-CM | POA: Diagnosis not present

## 2022-07-19 DIAGNOSIS — O99512 Diseases of the respiratory system complicating pregnancy, second trimester: Secondary | ICD-10-CM | POA: Diagnosis not present

## 2022-07-19 DIAGNOSIS — G935 Compression of brain: Secondary | ICD-10-CM | POA: Diagnosis not present

## 2022-07-19 DIAGNOSIS — O09521 Supervision of elderly multigravida, first trimester: Secondary | ICD-10-CM

## 2022-07-19 LAB — CBC WITH DIFFERENTIAL/PLATELET
Abs Immature Granulocytes: 0.03 10*3/uL (ref 0.00–0.07)
Basophils Absolute: 0 10*3/uL (ref 0.0–0.1)
Basophils Relative: 1 %
Eosinophils Absolute: 0.2 10*3/uL (ref 0.0–0.5)
Eosinophils Relative: 3 %
HCT: 33.9 % — ABNORMAL LOW (ref 36.0–46.0)
Hemoglobin: 11.1 g/dL — ABNORMAL LOW (ref 12.0–15.0)
Immature Granulocytes: 0 %
Lymphocytes Relative: 23 %
Lymphs Abs: 2 10*3/uL (ref 0.7–4.0)
MCH: 29.9 pg (ref 26.0–34.0)
MCHC: 32.7 g/dL (ref 30.0–36.0)
MCV: 91.4 fL (ref 80.0–100.0)
Monocytes Absolute: 0.4 10*3/uL (ref 0.1–1.0)
Monocytes Relative: 5 %
Neutro Abs: 5.9 10*3/uL (ref 1.7–7.7)
Neutrophils Relative %: 68 %
Platelets: 248 10*3/uL (ref 150–400)
RBC: 3.71 MIL/uL — ABNORMAL LOW (ref 3.87–5.11)
RDW: 15 % (ref 11.5–15.5)
WBC: 8.6 10*3/uL (ref 4.0–10.5)
nRBC: 0 % (ref 0.0–0.2)

## 2022-07-19 LAB — URINALYSIS, ROUTINE W REFLEX MICROSCOPIC
Bilirubin Urine: NEGATIVE
Glucose, UA: NEGATIVE mg/dL
Hgb urine dipstick: NEGATIVE
Ketones, ur: NEGATIVE mg/dL
Leukocytes,Ua: NEGATIVE
Nitrite: NEGATIVE
Protein, ur: NEGATIVE mg/dL
Specific Gravity, Urine: 1.01 (ref 1.005–1.030)
pH: 7 (ref 5.0–8.0)

## 2022-07-19 LAB — PROTIME-INR
INR: 0.9 (ref 0.8–1.2)
Prothrombin Time: 12.4 seconds (ref 11.4–15.2)

## 2022-07-19 LAB — KLEIHAUER-BETKE STAIN
Fetal Cells %: 0 %
Quantitation Fetal Hemoglobin: 0 mL

## 2022-07-19 LAB — FIBRINOGEN: Fibrinogen: 468 mg/dL (ref 210–475)

## 2022-07-19 LAB — APTT: aPTT: 27 seconds (ref 24–36)

## 2022-07-19 MED ORDER — CYCLOBENZAPRINE HCL 5 MG PO TABS: 5.0000 mg | ORAL_TABLET | Freq: Three times a day (TID) | ORAL | 0 refills | Status: AC | PRN

## 2022-07-19 MED ORDER — CYCLOBENZAPRINE HCL 5 MG PO TABS
10.0000 mg | ORAL_TABLET | Freq: Once | ORAL | Status: DC
  Filled 2022-07-19: qty 2

## 2022-07-19 MED ORDER — ACETAMINOPHEN-CAFFEINE 500-65 MG PO TABS
2.0000 | ORAL_TABLET | Freq: Once | ORAL | Status: AC
  Administered 2022-07-19: 2 via ORAL
  Filled 2022-07-19: qty 2
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
## 2022-07-19 NOTE — Telephone Encounter (Signed)
I instructed patient she needs to be seen at ED ASAP. Patient understood and is making arrangements to get there right now. I told patient she needs to get her and the baby checked out
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
## 2022-07-24 ENCOUNTER — Other Ambulatory Visit: Payer: Self-pay | Admitting: Family Medicine

## 2022-07-24 DIAGNOSIS — G43109 Migraine with aura, not intractable, without status migrainosus: Secondary | ICD-10-CM | POA: Insufficient documentation

## 2022-07-24 NOTE — Progress Notes (Unsigned)
Subjective:  Mary [name] is a 36 y.o. [id number] at [redacted]w[redacted]d being seen today for ongoing prenatal care.  She is currently monitored for the following issues for this high-risk pregnancy and has Asthma; Depression; Current smoker; Chiari I malformation (HCC); Acute bilateral low back pain without sciatica; Palpitations; ASCUS with positive high risk HPV; CIN I (cervical intraepithelial neoplasia I); Vitamin D deficiency; Hyperlipidemia; History of posttraumatic stress disorder (PTSD); AMA (advanced maternal age) multigravida 35+, first trimester; and Supervision of high risk pregnancy, antepartum on their problem list.  Patient reports {sx:14538}.   .  .   . Denies leaking of fluid.   The following portions of the patient's history were reviewed and updated as appropriate: allergies, current medications, past family history, past medical history, past social history, past surgical history and problem list. Problem list updated.  Objective:  There were no vitals filed for this visit.  Fetal Status:           General:  Alert, oriented and cooperative. Patient is in no acute distress.  Skin: Skin is warm and dry. No rash noted.   Cardiovascular: Normal heart rate noted  Respiratory: Normal respiratory effort, no problems with respiration noted  Abdomen: Soft, gravid, appropriate for gestational age.       Pelvic:       {Blank single:19197::"Cervical exam performed","Cervical exam deferred"}        Extremities: Normal range of motion.     Mental Status: Normal mood and affect. Normal behavior. Normal judgment and thought content.   Urinalysis:      Assessment and Plan:  Pregnancy: [id number] at [redacted]w[redacted]d  1. Supervision of high risk pregnancy, antepartum -Pap 03/2020 NILM, HPV neg -CBE information given -BTL consent next visit  2. AMA (advanced maternal age) multigravida 35+, first trimester -age 46/36 at delivery  3. History of posttraumatic stress disorder (PTSD) -perinatal MH resources  given -psychiatry referral placed  4. Hyperlipidemia, unspecified hyperlipidemia type -last done 06/2020, lifestyle modifications recommended at that time  5. Vitamin D deficiency -normal 2 years ago -TSH, VitD, VitB12 done today  6. Palpitations -discussed with patient, patient states ***  7. Chiari I malformation (HCC) -pt reports currently having *** HAs -last saw neurology 06/15/2022  8. Current smoker -Quit resources given -pt *** desires to quit at this time  9. Depression, unspecified depression type    06/15/2022    4:33 PM 11/08/2021    2:19 PM 02/10/2021    3:52 PM  PHQ9 SCORE ONLY  PHQ-9 Total Score 12 3 10       06/15/2022    4:33 PM 03/31/2020   11:31 AM  GAD 7 : Generalized Anxiety Score  Nervous, Anxious, on Edge 3 1  Control/stop worrying 3 1  Worry too much - different things 3 1  Trouble relaxing 3 1  Restless 0 1  Easily annoyed or irritable 0 0  Afraid - awful might happen 1 2  Total GAD 7 Score 13 7   -perinatal MH resources given*** -pt on Xanax from outside provider, using *** times per week, discussed needs to see perinatal MH provider for complete evaluation -psychiatry referral made to assist patient  10. Mild intermittent asthma without complication -pt reports has inhaler at home*** -pt states has used inhaler ***  Preterm labor symptoms and general obstetric precautions including but not limited to vaginal bleeding, contractions, leaking of fluid and fetal movement were reviewed in detail with the patient. I discussed the assessment and treatment plan with the patient.  The patient was provided an opportunity to ask questions and all were answered. The patient agreed with the plan and demonstrated an understanding of the instructions. The patient was advised to call back or seek an in-person office evaluation/go to MAU at [facility] for any urgent or concerning symptoms. Please refer to After Visit Summary for other counseling  recommendations.  No follow-ups on file.   Staci Carver, Odie Sera, NP

## 2022-07-25 ENCOUNTER — Telehealth: Payer: Self-pay | Admitting: Family Medicine

## 2022-07-25 ENCOUNTER — Other Ambulatory Visit: Payer: Self-pay | Admitting: Family Medicine

## 2022-07-25 ENCOUNTER — Ambulatory Visit (INDEPENDENT_AMBULATORY_CARE_PROVIDER_SITE_OTHER): Payer: Medicare Other | Admitting: Women's Health

## 2022-07-25 VITALS — BP 107/68 | HR 95 | Wt 162.0 lb

## 2022-07-25 DIAGNOSIS — J4520 Mild intermittent asthma, uncomplicated: Secondary | ICD-10-CM

## 2022-07-25 DIAGNOSIS — F32A Depression, unspecified: Secondary | ICD-10-CM

## 2022-07-25 DIAGNOSIS — Z8659 Personal history of other mental and behavioral disorders: Secondary | ICD-10-CM

## 2022-07-25 DIAGNOSIS — G935 Compression of brain: Secondary | ICD-10-CM

## 2022-07-25 DIAGNOSIS — F17200 Nicotine dependence, unspecified, uncomplicated: Secondary | ICD-10-CM

## 2022-07-25 DIAGNOSIS — O0990 Supervision of high risk pregnancy, unspecified, unspecified trimester: Secondary | ICD-10-CM

## 2022-07-25 DIAGNOSIS — G43109 Migraine with aura, not intractable, without status migrainosus: Secondary | ICD-10-CM

## 2022-07-25 DIAGNOSIS — Z3A21 21 weeks gestation of pregnancy: Secondary | ICD-10-CM

## 2022-07-25 DIAGNOSIS — E559 Vitamin D deficiency, unspecified: Secondary | ICD-10-CM

## 2022-07-25 DIAGNOSIS — E785 Hyperlipidemia, unspecified: Secondary | ICD-10-CM

## 2022-07-25 DIAGNOSIS — O0992 Supervision of high risk pregnancy, unspecified, second trimester: Secondary | ICD-10-CM

## 2022-07-25 DIAGNOSIS — R002 Palpitations: Secondary | ICD-10-CM

## 2022-07-25 DIAGNOSIS — O09522 Supervision of elderly multigravida, second trimester: Secondary | ICD-10-CM

## 2022-07-25 DIAGNOSIS — O09521 Supervision of elderly multigravida, first trimester: Secondary | ICD-10-CM

## 2022-07-25 NOTE — Patient Instructions (Addendum)
Crisis Support 24/7  National Maternal Mental Health Hotline (English y Espanol): 8-309-MMH-WKGS 779-356-7691)  Suicide and Crisis Lifeline (English y Espanol): Call or Text: 988 Website: HandlingCost.fr  Maternal Mental Health Resources  [facility]I HelpLine 8am - 11pm: (281) 747-1533 #1 En Espaol or #2 English Text "Help" to 718-087-0499 [facility]) Text en Espaol: 970-831-0899  Postpartum Support International Online Group Support: MuscleTreatments.it  [facility] Perinatal Mental Health (in-person or virtual visits for medication management and therapy): [phone] [address] [place], [place] [address]  [facility]: https://www.prctriad.com/ Has list of local perinatal mental health providers   -------   Childbirth Education Options: [facility] Department Classes:  Childbirth education classes can help you get ready for a positive parenting experience. You can also meet other expectant parents and get free stuff for your baby. Each class runs for five weeks on the same night and costs $45 for the mother-to-be and her support person. Medicaid covers the cost if you are eligible. Call [phone] to register. [facility] Childbirth Education: Classes can vary in availability and schedule is subject to change. For most up-to-date information please visit www.conehealthybaby.com to review and register.

## 2022-07-25 NOTE — Telephone Encounter (Signed)
Inbasket was sent stating the patient decided to opt out of centering and switch back to traditional care.

## 2022-07-26 MED ORDER — ALPRAZOLAM 0.5 MG PO TABS
0.2500 mg | ORAL_TABLET | Freq: Three times a day (TID) | ORAL | 0 refills | Status: DC | PRN
Start: 2022-07-26 — End: 2022-08-15

## 2022-08-02 ENCOUNTER — Telehealth: Payer: Self-pay | Admitting: Neurology

## 2022-08-02 NOTE — Telephone Encounter (Signed)
Pt called in stating she is still having neck and spinal pain. She was supposed to get an MRI at the hospital, but because she was pregnant they ended up checking the baby and never did the MRI. She would like to see if Dr. Everlena Cooper could order one?

## 2022-08-03 NOTE — Telephone Encounter (Signed)
Pt is sch for 08-10-22 with Everlena Cooper

## 2022-08-03 NOTE — Telephone Encounter (Signed)
Patient advised, I can't order any tests for something I haven't evaluated.  She would need to make an appointment
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
## 2022-08-10 ENCOUNTER — Telehealth: Payer: Medicare Other | Admitting: Neurology

## 2022-08-10 DIAGNOSIS — Z029 Encounter for administrative examinations, unspecified: Secondary | ICD-10-CM

## 2022-08-15 ENCOUNTER — Encounter: Payer: Self-pay | Admitting: Internal Medicine

## 2022-08-15 ENCOUNTER — Other Ambulatory Visit: Payer: Self-pay | Admitting: Internal Medicine

## 2022-08-15 DIAGNOSIS — J302 Other seasonal allergic rhinitis: Secondary | ICD-10-CM

## 2022-08-15 NOTE — Telephone Encounter (Signed)
Spoke with patient.  She apologizes for not letting Dr Ardyth Harps know that she was pregnant sooner.  She "thought that her OB would have let her know".  She states that she tried to wean herself off of Xanax.  She lost 28 pounds and her blood pressure increased.  Patient is aware of Dr Hardie Shackleton recommendation.

## 2022-08-15 NOTE — Telephone Encounter (Signed)
Patient is aware and will try to wean off of xanax.  She would also like a refill of albuterol.

## 2022-08-16 ENCOUNTER — Other Ambulatory Visit: Payer: Self-pay | Admitting: Certified Nurse Midwife

## 2022-08-16 ENCOUNTER — Encounter: Payer: Self-pay | Admitting: Family Medicine

## 2022-08-16 ENCOUNTER — Telehealth: Payer: Self-pay | Admitting: General Practice

## 2022-08-16 DIAGNOSIS — F4310 Post-traumatic stress disorder, unspecified: Secondary | ICD-10-CM

## 2022-08-16 DIAGNOSIS — Z8659 Personal history of other mental and behavioral disorders: Secondary | ICD-10-CM

## 2022-08-16 MED ORDER — ALPRAZOLAM 0.25 MG PO TABS: 0.2500 mg | ORAL_TABLET | Freq: Three times a day (TID) | ORAL | 5 refills | Status: DC | PRN

## 2022-08-16 MED ORDER — ALPRAZOLAM 0.5 MG PO TABS: 0.2500 mg | ORAL_TABLET | Freq: Three times a day (TID) | ORAL | 5 refills | Status: DC | PRN

## 2022-08-16 MED ORDER — ALBUTEROL SULFATE HFA 108 (90 BASE) MCG/ACT IN AERS: 2.0000 | INHALATION_SPRAY | Freq: Four times a day (QID) | RESPIRATORY_TRACT | 2 refills | Status: DC | PRN

## 2022-08-16 MED ORDER — ALPRAZOLAM 0.5 MG PO TABS
ORAL_TABLET | ORAL | 0 refills | Status: DC
Start: 2022-08-16 — End: 2022-08-16

## 2022-08-16 NOTE — Telephone Encounter (Signed)
Called patient due to [employer]. Patient states her PCP is wanting to wean her off her xanax because she just found out she is pregnant. Patient is highly concerned about withdrawal and what will happen if she is taken off. Patient wants to know if we would be willing to prescribe it for the duration of her pregnancy. Discussed with Edd Arbour who was okay with prescribing xanax for the reminder of her pregnancy given that patient is okay with risks: low birth weight, SAB earlier in pregnancy, & difficulty after delivery. Reviewed risks with patient. She verbalized understanding and is aware of risks. She is afraid to try other medications as an alternative due to past reactions and not wanting to hurt herself or the baby. Edd Arbour sent in refills to pharmacy & patient informed. Patient verbalized understanding.

## 2022-08-16 NOTE — Progress Notes (Signed)
See RN telephone note.  Edd Arbour, CNM, MSN, IBCLC Certified Nurse Midwife, [facility]

## 2022-08-20 NOTE — Progress Notes (Unsigned)
   PRENATAL VISIT NOTE  Subjective:  Mary [name] is a 36 y.o. [phone] at 25w***d being seen today for ongoing prenatal care.  She is currently monitored for the following issues for this high-risk pregnancy and has Asthma; Depression; Current smoker; Chiari I malformation (HCC); Palpitations; ASCUS with positive high risk HPV; CIN I (cervical intraepithelial neoplasia I); Vitamin D deficiency; Hyperlipidemia; History of posttraumatic stress disorder (PTSD); AMA (advanced maternal age) multigravida 35+, first trimester; Supervision of high risk pregnancy, antepartum; and Migraine with aura and without status migrainosus on their problem list.  Patient reports {[id number]}.   .  .   . Denies leaking of fluid.   The following portions of the patient's history were reviewed and updated as appropriate: allergies, current medications, past family history, past medical history, past social history, past surgical history and problem list.   Objective:  There were no vitals filed for this visit.  Fetal Status:           General:  Alert, oriented and cooperative. Patient is in no acute distress.  Skin: Skin is warm and dry. No rash noted.   Cardiovascular: Normal heart rate noted  Respiratory: Normal respiratory effort, no problems with respiration noted  Abdomen: Soft, gravid, appropriate for gestational age.        Pelvic: Cervical exam performed in the presence of a chaperone        Extremities: Normal range of motion.     Mental Status: Normal mood and affect. Normal behavior. Normal judgment and thought content.   Assessment and Plan:  Pregnancy: [id number] at 25w***d 1. Supervision of high risk pregnancy, antepartum 28 wk labs next time Offered and recommended flu shot - pt *** Discussed sterilization: - She desires permanent sterilization. Discussed alternatives including LARC options and vasectomy. She declines these options.  - Discussed surgery of salpingectomy vs tubal ligation. She  would like to do a salpingectomy.  - Risks of surgery include but are not limited to: bleeding, infection, injury to surrounding organs/tissues (i.e. bowel/bladder/ureters), need for additional procedures, wound complications, hospital re-admission, and conversion to open surgery, VTE - Reviewed restrictions and recovery following surgery   2. AMA (advanced maternal age) multigravida 35+, first trimester LR NIPS Normal anatomy  3. Current smoker Current use:  Offered cessation strategies. ***  4. Depression, unspecified depression type No meds - follows with behavioral health. ***  5. Palpitations Has Cards/OB appt on 9/18  Preterm labor symptoms and general obstetric precautions including but not limited to vaginal bleeding, contractions, leaking of fluid and fetal movement were reviewed in detail with the patient. Please refer to After Visit Summary for other counseling recommendations.   No follow-ups on file.  Future Appointments  Date Time Provider Department Center  08/23/2022 10:15 AM Milas Hock, MD [facility]  08/28/2022 10:00 AM Thomasene Ripple, DO [facility] None  08/29/2022  8:30 AM Drema Dallas, DO [facility] None  10/02/2022 11:15 AM [facility] NURSE [facility]  10/02/2022 11:30 AM [facility] US3 [facility]  12/18/2022 10:10 AM Drema Dallas, DO [facility] None    Milas Hock, MD

## 2022-08-21 ENCOUNTER — Telehealth: Payer: Self-pay | Admitting: *Deleted

## 2022-08-21 ENCOUNTER — Encounter: Payer: Self-pay | Admitting: *Deleted

## 2022-08-21 NOTE — Patient Outreach (Signed)
  Care Coordination   Initial Visit Note   08/21/2022 Name: Mary [name] MRN: [id number] DOB: December 06, 1986  Mary [name] is a 36 y.o. year old female who sees Philip Aspen, Limmie Patricia, MD for primary care. I spoke with  [name] by phone today.  What matters to the patients health and wellness today?  No needs    Goals Addressed               This Visit's Progress     COMPLETED: No current issues (pt-stated)        Care Coordination Interventions: Reviewed medications with patient and discussed adherence with all medications Reviewed scheduled/upcoming provider appointments including pending appointments and verified AWV was completed for 2023 Assessed social determinant of health barriers          SDOH assessments and interventions completed:  Yes  SDOH Interventions Today    Flowsheet Row Most Recent Value  SDOH Interventions   Food Insecurity Interventions Intervention Not Indicated  Housing Interventions Intervention Not Indicated  Transportation Interventions Intervention Not Indicated        Care Coordination Interventions Activated:  Yes  Care Coordination Interventions:  Yes, provided   Follow up plan: No further intervention required.   Encounter Outcome:  Pt. Visit Completed   Elliot Cousin, RN Care Management Coordinator [facility] [employer] Main Office [phone]

## 2022-08-21 NOTE — Patient Instructions (Signed)
Visit Information  Thank you for taking time to visit with me today. Please don't hesitate to contact me if I can be of assistance to you.   Following are the goals we discussed today:   Goals Addressed               This Visit's Progress     COMPLETED: No current issues (pt-stated)        Care Coordination Interventions: Reviewed medications with patient and discussed adherence with all medications Reviewed scheduled/upcoming provider appointments including pending appointments and verified AWV was completed for 2023 Assessed social determinant of health barriers          Please call the care guide team at [phone] if you need to cancel or reschedule your appointment.   If you are experiencing a Mental Health or Behavioral Health Crisis or need someone to talk to, please call the Suicide and Crisis Lifeline: 988 call the [place] National Suicide Prevention Lifeline: 508-658-6845 or TTY: (731)565-1709 TTY 204-519-6183) to talk to a trained counselor call 1-800-273-TALK (toll free, 24 hour hotline)  Patient verbalizes understanding of instructions and care plan provided today and agrees to view in MyChart. Active MyChart status and patient understanding of how to access instructions and care plan via MyChart confirmed with patient.     No further follow up required: No needs at this time  Elliot Cousin, RN Care Management Coordinator [facility] [employer] [facility] Main Office [phone]

## 2022-08-23 ENCOUNTER — Telehealth (INDEPENDENT_AMBULATORY_CARE_PROVIDER_SITE_OTHER): Payer: Medicare Other | Admitting: Obstetrics and Gynecology

## 2022-08-23 ENCOUNTER — Encounter: Payer: Self-pay | Admitting: Obstetrics and Gynecology

## 2022-08-23 VITALS — BP 115/83 | HR 92

## 2022-08-23 DIAGNOSIS — O0990 Supervision of high risk pregnancy, unspecified, unspecified trimester: Secondary | ICD-10-CM

## 2022-08-23 DIAGNOSIS — O99342 Other mental disorders complicating pregnancy, second trimester: Secondary | ICD-10-CM

## 2022-08-23 DIAGNOSIS — F17210 Nicotine dependence, cigarettes, uncomplicated: Secondary | ICD-10-CM

## 2022-08-23 DIAGNOSIS — Z3A25 25 weeks gestation of pregnancy: Secondary | ICD-10-CM

## 2022-08-23 DIAGNOSIS — O09522 Supervision of elderly multigravida, second trimester: Secondary | ICD-10-CM

## 2022-08-23 DIAGNOSIS — O09521 Supervision of elderly multigravida, first trimester: Secondary | ICD-10-CM

## 2022-08-23 DIAGNOSIS — F32A Depression, unspecified: Secondary | ICD-10-CM

## 2022-08-23 DIAGNOSIS — R002 Palpitations: Secondary | ICD-10-CM

## 2022-08-23 DIAGNOSIS — O99332 Smoking (tobacco) complicating pregnancy, second trimester: Secondary | ICD-10-CM

## 2022-08-23 DIAGNOSIS — F17200 Nicotine dependence, unspecified, uncomplicated: Secondary | ICD-10-CM

## 2022-08-24 ENCOUNTER — Encounter: Payer: Self-pay | Admitting: General Practice

## 2022-08-25 ENCOUNTER — Telehealth: Payer: Self-pay | Admitting: Internal Medicine

## 2022-08-25 NOTE — Telephone Encounter (Signed)
Pt is calling and would like rachel to return her call. Pt did not elaborate the reason for the call. Pt is aware rachel out of office today

## 2022-08-28 ENCOUNTER — Ambulatory Visit: Payer: Medicare Other | Admitting: Cardiology
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
## 2022-08-29 ENCOUNTER — Telehealth (INDEPENDENT_AMBULATORY_CARE_PROVIDER_SITE_OTHER): Payer: Medicare Other | Admitting: Neurology

## 2022-08-29 ENCOUNTER — Encounter: Payer: Self-pay | Admitting: Neurology

## 2022-08-29 VITALS — Ht 64.0 in | Wt 158.0 lb

## 2022-08-29 DIAGNOSIS — M542 Cervicalgia: Secondary | ICD-10-CM | POA: Diagnosis not present

## 2022-08-29 DIAGNOSIS — Z3A25 25 weeks gestation of pregnancy: Secondary | ICD-10-CM | POA: Diagnosis not present

## 2022-08-29 DIAGNOSIS — G43109 Migraine with aura, not intractable, without status migrainosus: Secondary | ICD-10-CM | POA: Diagnosis not present

## 2022-08-29 DIAGNOSIS — G935 Compression of brain: Secondary | ICD-10-CM | POA: Diagnosis not present

## 2022-08-29 MED ORDER — CYCLOBENZAPRINE HCL 5 MG PO TABS
5.0000 mg | ORAL_TABLET | Freq: Three times a day (TID) | ORAL | 5 refills | Status: DC | PRN
Start: 2022-08-29 — End: 2024-05-14

## 2022-08-29 NOTE — Telephone Encounter (Signed)
Spoke with pt and she stated that her Obgyn will take over the refills on the Xanax until she has the baby but will need to resume with regular PCP afterwards. Also pt stated that she had not heard from psy so I gave her the information to F/U with them to see where they are with an appt for her.

## 2022-08-29 NOTE — Addendum Note (Signed)
Addended by: Jake Seats on: 08/29/2022 09:35 AM   Modules accepted: Orders

## 2022-09-18 ENCOUNTER — Ambulatory Visit: Payer: Medicare Other | Attending: Neurology

## 2022-09-18 ENCOUNTER — Other Ambulatory Visit: Payer: Self-pay

## 2022-09-18 DIAGNOSIS — G43109 Migraine with aura, not intractable, without status migrainosus: Secondary | ICD-10-CM | POA: Insufficient documentation

## 2022-09-18 DIAGNOSIS — R293 Abnormal posture: Secondary | ICD-10-CM | POA: Insufficient documentation

## 2022-09-18 DIAGNOSIS — Z3A25 25 weeks gestation of pregnancy: Secondary | ICD-10-CM | POA: Insufficient documentation

## 2022-09-18 DIAGNOSIS — G935 Compression of brain: Secondary | ICD-10-CM | POA: Insufficient documentation

## 2022-09-18 DIAGNOSIS — M542 Cervicalgia: Secondary | ICD-10-CM | POA: Diagnosis not present
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
## 2022-09-22 ENCOUNTER — Ambulatory Visit: Payer: Medicare Other | Admitting: Physical Therapy

## 2022-09-22 DIAGNOSIS — R293 Abnormal posture: Secondary | ICD-10-CM

## 2022-09-22 DIAGNOSIS — G935 Compression of brain: Secondary | ICD-10-CM | POA: Diagnosis not present

## 2022-09-22 DIAGNOSIS — G43109 Migraine with aura, not intractable, without status migrainosus: Secondary | ICD-10-CM | POA: Diagnosis not present

## 2022-09-22 DIAGNOSIS — M542 Cervicalgia: Secondary | ICD-10-CM | POA: Diagnosis not present

## 2022-09-22 NOTE — Therapy (Signed)
OUTPATIENT PHYSICAL THERAPY CERVICAL EVALUATION   Patient Name: Mary [name] MRN: [id number] DOB:04/06/1986, 36 y.o., female Today's Date: 09/22/2022   PT End of Session - 09/22/22 1225     Visit Number 2    Number of Visits 12    Date for PT Re-Evaluation 12/08/22    PT Start Time 0905    PT Stop Time 0956    PT Time Calculation (min) 51 min    Activity Tolerance Patient tolerated treatment well    Behavior During Therapy [facility] for tasks assessed/performed              Past Medical History:  Diagnosis Date   Asthma    Bronchitis 01/31/2016   Chiari malformation type I (HCC)    Depression    GERD (gastroesophageal reflux disease)    H/O multiple concussions    Headache(784.0)    History of palpitations    Hx of fracture of nose    Mental disorder    Multiple fractures    Hx: of a leg and arm fracture as a child   Pneumonia    PONV (postoperative nausea and vomiting)    Post traumatic stress disorder (PTSD)    Pregnant 01/21/2016   PTSD (post-traumatic stress disorder)    Sinus infection 01/31/2016   Supervision of normal pregnancy, antepartum 02/08/2016    [facility] at             13+1 week FOB   [name] 36 yo WM Dating By  LMP and [place] Pap  02/08/16 GC/CT Initial:                36+wks: Genetic Screen NT/IT:  CF screen  Anatomic [place]  Flu vaccine  Tdap Recommended ~ 28wks Glucose Screen  2 hr GBS  Feed Preference  Contraception  Circumcision  Childbirth Classes  Pediatrician     Past Surgical History:  Procedure Laterality Date   botox injections     colposcopy     DILATION AND CURETTAGE OF UTERUS     nexplanon     SUBOCCIPITAL CRANIECTOMY CERVICAL LAMINECTOMY N/A 12/16/2018   Procedure: Cervical one Laminectomy/Chiari decompression;  Surgeon: Donalee Citrin, MD;  Location: [facility] OR;  Service: Neurosurgery;  Laterality: N/A;   WISDOM TOOTH EXTRACTION     Patient Active Problem List   Diagnosis Date Noted   Migraine with aura and without  status migrainosus 07/24/2022   AMA (advanced maternal age) multigravida 35+, first trimester 05/17/2022   Supervision of high risk pregnancy, antepartum 05/17/2022   History of posttraumatic stress disorder (PTSD) 04/25/2022   Vitamin D deficiency 09/11/2019   Hyperlipidemia 09/11/2019   Palpitations 07/30/2018   Chiari I malformation (HCC) 02/13/2018   Current smoker 04/20/2017   CIN I (cervical intraepithelial neoplasia I) 06/28/2015   ASCUS with positive high risk HPV 07/08/2013   Asthma 06/04/2013   Depression 06/04/2013   REFERRING PROVIDER: Drema Dallas, DO  REFERRING DIAG: Cervicalgia; Migraine with aura and without status migrainosus, not intractable; Chiari I malformation; [redacted] weeks gestation of pregnancy  THERAPY DIAG:  Cervicalgia  Abnormal posture  Rationale for Evaluation and Treatment Rehabilitation  ONSET DATE: August 2023  SUBJECTIVE:  SUBJECTIVE STATEMENT: Pain around a 4 today.  More pain on left side of neck than right. PERTINENT HISTORY:  History of TIA, asthma, Chiari malformation, depression, smoker, PTSD, and patient is currently pregnant  PAIN:  Are you having pain? Yes: NPRS scale: 4/10 Pain location: neck and back Pain description: stabbing and shooting (intermittent)  Aggravating factors: turning her head (right > left),looking down or up  Relieving factors: medication and heat  PRECAUTIONS: Other: pregnant (29 weeks; boy to be named Camera operator).  Chiari I malformation.  WEIGHT BEARING RESTRICTIONS No   PATIENT GOALS be able to wash dishes, clean her house, make her bed, wash her hair (she requires additional assistance to perform all of these activities from her family)   OBJECTIVE:    TODAY'S TREATMENT:                                    09/22/22  EXERCISE LOG  Exercise Repetitions and Resistance Comments  UBE at 64 RPM's 8 minutes.                      [place] at 1.50 W/CM2 x 8 minutes to bilateral UT's f/b STW/M x 8 minutes to reduce tone f/b low-level IFC at 80-150 Hz on 40% scan x 20 minutes.  Patient tolerated treatment well with normal modality response following removal of modality.  ASSESSMENT:  CLINICAL IMPRESSION: Patient with a great deal of tone over her left UT and Levator Scapulae musculature.  She did well with treatment and found STW/M helpful.    GOALS: Goals reviewed with patient? No  SHORT TERM GOALS: Target date: 10/09/2022   Patient will be independent with her initial HEP.  Baseline:   Goal status: INITIAL  2.  Patient will be able to perform her daily activities without her familiar pain exceeding 5/10. Baseline:   Goal status: INITIAL  3.  Patient will be able to demonstrate at least 60 degrees of bilateral cervical rotation without reproducing her familiar cervical pain.  Baseline:  Goal status: INITIAL  LONG TERM GOALS: Target date: 10/30/2022  Patient will be independent with her advanced HEP.  Baseline:   Goal status: INITIAL  2.  Patient will be able to demonstrate at least 35 degrees of active cervical flexion without being limited by her familiar symptoms for improved function cleaning.  Baseline:   Goal status: INITIAL  3.  Patient will be able to clean her house without her familiar cervical pain exceeding 3/10.  Baseline:   Goal status: INITIAL  4.  Patient will be able to simulate washing her hair without being limited by her familiar symptoms for improved independence.  Baseline:   Goal status: INITIAL  PLAN: PT FREQUENCY: 2x/week  PT DURATION: 6 weeks  PLANNED INTERVENTIONS: Therapeutic exercises, Therapeutic activity, Neuromuscular re-education, Patient/Family education, Self Care, Joint mobilization, Dry Needling, Electrical stimulation, Spinal mobilization,  Cryotherapy, Moist heat, Manual therapy, and Re-evaluation  PLAN FOR NEXT SESSION: UBE, manual cervical traction, manual therapy, rows, chin tucks, and other appropriately matched interventions    Mika Anastasi, [place], PT 09/22/2022, 12:33 PM

## 2022-09-25 ENCOUNTER — Encounter: Payer: Self-pay | Admitting: Cardiology

## 2022-09-25 ENCOUNTER — Ambulatory Visit: Payer: Medicare Other | Attending: Cardiology | Admitting: Cardiology

## 2022-09-25 VITALS — BP 108/70 | HR 116 | Ht 64.0 in | Wt 168.0 lb

## 2022-09-25 DIAGNOSIS — Z7689 Persons encountering health services in other specified circumstances: Secondary | ICD-10-CM | POA: Diagnosis not present

## 2022-09-25 DIAGNOSIS — R0789 Other chest pain: Secondary | ICD-10-CM | POA: Diagnosis not present

## 2022-09-25 DIAGNOSIS — R002 Palpitations: Secondary | ICD-10-CM

## 2022-09-25 MED ORDER — PROPRANOLOL HCL 10 MG PO TABS: 10.0000 mg | ORAL_TABLET | Freq: Every day | ORAL | 1 refills | Status: DC

## 2022-09-25 NOTE — Patient Instructions (Signed)
Medication Instructions:  Your physician has recommended you make the following change in your medication:  START: Propanolol 10mg  daily.  *If you need a refill on your cardiac medications before your next appointment, please call your pharmacy*   Lab Work: NONE ordered at this time of appointment  If you have labs (blood work) drawn today and your tests are completely normal, you will receive your results only by: [place] (if you have MyChart) OR A paper copy in the mail If you have any lab test that is abnormal or we need to change your treatment, we will call you to review the results.   Testing/Procedures: NONE ordered at this time of appointment   Follow-Up: At [facility], you and your health needs are our priority.  As part of our continuing mission to provide you with exceptional heart care, we have created designated Provider Care Teams.  These Care Teams include your primary Cardiologist (physician) and Advanced Practice Providers (APPs -  Physician Assistants and Nurse Practitioners) who all work together to provide you with the care you need, when you need it.  We recommend signing up for the patient portal called "MyChart".  Sign up information is provided on this After Visit Summary.  MyChart is used to connect with patients for Virtual Visits (Telemedicine).  Patients are able to view lab/test results, encounter notes, upcoming appointments, etc.  Non-urgent messages can be sent to your provider as well.   To learn more about what you can do with MyChart, go to [email or web address].    Your next appointment:   8 week(s)  The format for your next appointment:   In Person  Provider:   Berniece Salines, DO

## 2022-09-25 NOTE — Progress Notes (Signed)
Cardio-Obstetrics Clinic  New Evaluation  Date:  09/25/2022   ID:  Mary [name], DOB [birth year], MRN [id number]  PCP:  Isaac Bliss, Rayford Halsted, MD   [place] Providers Cardiologist:  Berniece Salines, DO  Electrophysiologist:  None       Referring MD: Isaac Bliss, [place]*   Chief Complaint: " I am having chest pain"  History of Present Illness:    Mary [name] is a 36 y.o. female [id number] who is being seen today for the evaluation of palpitation and chest pain at the request of Isaac Bliss, Estel*.   Medical history includes near syncope, Chiari malformation, PTSD the patient previously saw Dr. Oval Linsey back in 2019-2020.  Her last visit was a televisit in 2020 at which time to discuss her monitor results as well as her echo result.  Since that time she notes that she still has been having worsening palpitations that is associated with chest discomfort.  Being pregnant has made this worse.  She is concerned.  She has not had any near syncope episodes.   Prior CV Studies Reviewed: The following studies were reviewed today:  TTE 08/29/2018 Study Conclusions  - Left ventricle: The cavity size was normal. Wall thickness was    normal. Systolic function was normal. The estimated ejection    fraction was in the range of 60% to 65%. Wall motion was normal;    there were no regional wall motion abnormalities. Left    ventricular diastolic function parameters were normal.  - Aortic valve: Valve area (VTI): 1.98 cm^2. Valve area (Vmax):    1.83 cm^2. Valve area (Vmean): 1.9 cm^2.  - Technically adequate study.   Zio monitor 08/29/2018 7 Day Zio Monitor Quality: Fair.  Baseline artifact. Predominant rhythm: sinus rhythm Average heart rate: 94 bpm Max heart rate: 143 bpm Rare PACs    Past Medical History:  Diagnosis Date   Asthma    Bronchitis 01/31/2016   Chiari malformation type I ([place])    Depression    GERD (gastroesophageal reflux disease)    H/O  multiple concussions    Headache(784.0)    History of palpitations    Hx of fracture of nose    Mental disorder    Multiple fractures    Hx: of a leg and arm fracture as a child   Pneumonia    PONV (postoperative nausea and vomiting)    Post traumatic stress disorder (PTSD)    Pregnant 01/21/2016   PTSD (post-traumatic stress disorder)    Sinus infection 01/31/2016   Supervision of normal pregnancy, antepartum 02/08/2016    [facility] at             13+1 week FOB   Ventura Sellers 36 yo WM Dating By  LMP and [place] Pap  02/08/16 GC/CT Initial:                36+wks: Genetic Screen NT/IT:  CF screen  Anatomic [place]  Flu vaccine  Tdap Recommended ~ 28wks Glucose Screen  2 hr GBS  Feed Preference  Contraception  Circumcision  Childbirth Classes  Pediatrician      Past Surgical History:  Procedure Laterality Date   botox injections     colposcopy     DILATION AND CURETTAGE OF UTERUS     nexplanon     SUBOCCIPITAL CRANIECTOMY CERVICAL LAMINECTOMY N/A 12/16/2018   Procedure: Cervical one Laminectomy/Chiari decompression;  Surgeon: Kary Kos, MD;  Location: [place];  Service: Neurosurgery;  Laterality:  N/A;   WISDOM TOOTH EXTRACTION        OB History     Gravida  6   Para  4   Term  4   Preterm      AB  1   Living  4      SAB  1   IAB      Ectopic      Multiple  0   Live Births  4               Current Medications: Current Meds  Medication Sig   albuterol (VENTOLIN HFA) 108 (90 Base) MCG/ACT inhaler Inhale 2 puffs into the lungs every 6 (six) hours as needed for wheezing or shortness of breath.   ALPRAZolam (XANAX) 0.5 MG tablet Take by mouth.   aspirin-acetaminophen-caffeine (EXCEDRIN MIGRAINE) 250-250-65 MG tablet Take by mouth every 6 (six) hours as needed for headache.   cyclobenzaprine (FLEXERIL) 5 MG tablet Take 1 tablet (5 mg total) by mouth 3 (three) times daily as needed for muscle spasms.   prenatal vitamin w/FE, FA (PRENATAL 1 + 1) 27-1  MG TABS tablet Take 1 tablet by mouth daily at 12 noon.   propranolol (INDERAL) 10 MG tablet Take 1 tablet (10 mg total) by mouth daily.     Allergies:   Codeine, Influenza a (h1n1) monovalent vaccine, Peanut-containing drug products, Sulfa antibiotics, and Robaxin [methocarbamol]   Social History   Socioeconomic History   Marital status: Significant Other    Spouse name: Not on file   Number of children: 4   Years of education: Not on file   Highest education level: GED or equivalent  Occupational History   Occupation: disabled    Comment: PTSD  Tobacco Use   Smoking status: Every Day    Packs/day: 0.50    Types: Cigarettes   Smokeless tobacco: Never   Tobacco comments:    NEVER USE SNUFF OR CHEWING TOBACCO  Vaping Use   Vaping Use: Never used  Substance and Sexual Activity   Alcohol use: Not Currently    Comment: social   Drug use: No   Sexual activity: Yes    Birth control/protection: None  Other Topics Concern   Not on file  Social History Narrative   Patient is right-handed. She is separated, has 4 sons. Is on disabilty for PTSD. She has one caffeine drink daily, walks 3 x week.home is one level   Social Determinants of Health   Financial Resource Strain: Low Risk  (03/31/2020)   Overall Financial Resource Strain (CARDIA)    Difficulty of Paying Living Expenses: Not hard at all  Food Insecurity: No Food Insecurity (08/21/2022)   Hunger Vital Sign    Worried About Running Out of Food in the Last Year: Never true    Ran Out of Food in the Last Year: Never true  Transportation Needs: No Transportation Needs (08/21/2022)   PRAPARE - Hydrologist (Medical): No    Lack of Transportation (Non-Medical): No  Physical Activity: Insufficiently Active (03/31/2020)   Exercise Vital Sign    Days of Exercise per Week: 2 days    Minutes of Exercise per Session: 30 min  Stress: No Stress Concern Present (03/31/2020)   [place]    Feeling of Stress : Only a little  Social Connections: Moderately Isolated (03/31/2020)   Social Connection and Isolation Panel [NHANES]    Frequency of Communication  with Friends and Family: More than three times a week    Frequency of Social Gatherings with Friends and Family: Once a week    Attends Religious Services: 1 to 4 times per year    Active Member of [employer] or Organizations: No    Attends Music therapist: Never    Marital Status: Divorced      Family History  Problem Relation Age of Onset   Cancer Mother 4       breast   Hypertension Mother    Depression Mother    Depression Father    Seizures Father    Cancer Maternal Grandmother        breast and cervical   Asthma Son    Asthma Son    Drug abuse Paternal Grandmother    Alcohol abuse Paternal Grandmother    Hypertension Other    Cancer Other       ROS:   Please see the history of present illness.     All other systems reviewed and are negative.   Labs/EKG Reviewed:    EKG:   EKG is was  ordered today.  The ekg ordered today demonstrates sinus rhythm.  Recent Labs: 07/19/2022: Hemoglobin 11.1; Platelets 248   Recent Lipid Panel Lab Results  Component Value Date/Time   CHOL 186 07/07/2020 10:50 AM   TRIG 181 (H) 07/07/2020 10:50 AM   HDL 34 (L) 07/07/2020 10:50 AM   CHOLHDL 5.5 (H) 07/07/2020 10:50 AM   LDLCALC 122 (H) 07/07/2020 10:50 AM    Physical Exam:    VS:  BP 108/70   Pulse (!) 116   Ht 5\' 4"  (1.626 m)   Wt 168 lb (76.2 kg)   LMP 02/26/2022 (Within Weeks) Comment: Or 03/08/22  SpO2 98%   BMI 28.84 kg/m     Wt Readings from Last 3 Encounters:  09/25/22 168 lb (76.2 kg)  08/29/22 158 lb (71.7 kg)  07/25/22 162 lb (73.5 kg)     GEN:  Well nourished, well developed in no acute distress HEENT: Normal NECK: No JVD; No carotid bruits LYMPHATICS: No lymphadenopathy CARDIAC: RRR, no murmurs, rubs, gallops RESPIRATORY:  Clear  to auscultation without rales, wheezing or rhonchi  ABDOMEN: Soft, non-tender, non-distended MUSCULOSKELETAL:  No edema; No deformity  SKIN: Warm and dry NEUROLOGIC:  Alert and oriented x 3 PSYCHIATRIC:  Normal affect    Risk Assessment/Risk Calculators:     CARPREG II Risk Prediction Index Score:  1.  The patient's risk for a primary cardiac event is 5%.            ASSESSMENT & PLAN:    Atypical chest pain Palpitations  Her chest pain is atypical.  But is associated with her palpitations.  We will do a trial of low-dose propranolol 10 mg daily.  Hopefully this will help.  Postpartum we will need to explore any further evaluation for the coronaries especially the anatomy if she continues to have chest discomfort.  Patient Instructions  Medication Instructions:  Your physician has recommended you make the following change in your medication:  START: Propanolol 10mg  daily.  *If you need a refill on your cardiac medications before your next appointment, please call your pharmacy*   Lab Work: NONE ordered at this time of appointment  If you have labs (blood work) drawn today and your tests are completely normal, you will receive your results only by: [place] (if you have MyChart) OR A paper copy in the mail If  you have any lab test that is abnormal or we need to change your treatment, we will call you to review the results.   Testing/Procedures: NONE ordered at this time of appointment   Follow-Up: At [facility], you and your health needs are our priority.  As part of our continuing mission to provide you with exceptional heart care, we have created designated Provider Care Teams.  These Care Teams include your primary Cardiologist (physician) and Advanced Practice Providers (APPs -  Physician Assistants and Nurse Practitioners) who all work together to provide you with the care you need, when you need it.  We recommend signing up for the patient portal  called "MyChart".  Sign up information is provided on this After Visit Summary.  MyChart is used to connect with patients for Virtual Visits (Telemedicine).  Patients are able to view lab/test results, encounter notes, upcoming appointments, etc.  Non-urgent messages can be sent to your provider as well.   To learn more about what you can do with MyChart, go to [email or web address].    Your next appointment:   8 week(s)  The format for your next appointment:   In Person  Provider:   Berniece Salines, DO      Dispo:  No follow-ups on file.   Medication Adjustments/Labs and Tests Ordered: Current medicines are reviewed at length with the patient today.  Concerns regarding medicines are outlined above.  Tests Ordered: Orders Placed This Encounter  Procedures   EKG 12-Lead   Medication Changes: Meds ordered this encounter  Medications   propranolol (INDERAL) 10 MG tablet    Sig: Take 1 tablet (10 mg total) by mouth daily.    Dispense:  30 tablet    Refill:  1

## 2022-09-28 ENCOUNTER — Encounter: Payer: Medicare Other | Admitting: Physical Therapy

## 2022-09-28 ENCOUNTER — Other Ambulatory Visit: Payer: Self-pay

## 2022-09-28 ENCOUNTER — Inpatient Hospital Stay (HOSPITAL_COMMUNITY)
Admission: AD | Admit: 2022-09-28 | Discharge: 2022-09-28 | Disposition: A | Discharge status: Home / Self Care | Payer: Medicare Other | Attending: Obstetrics & Gynecology | Admitting: Obstetrics & Gynecology

## 2022-09-28 ENCOUNTER — Inpatient Hospital Stay (HOSPITAL_COMMUNITY)
Admission: AD | Admit: 2022-09-28 | Payer: Medicare Other | Source: Home / Self Care | Admitting: Obstetrics & Gynecology

## 2022-09-28 ENCOUNTER — Encounter (HOSPITAL_COMMUNITY): Payer: Self-pay | Admitting: Obstetrics & Gynecology

## 2022-09-28 DIAGNOSIS — O09523 Supervision of elderly multigravida, third trimester: Secondary | ICD-10-CM | POA: Diagnosis not present

## 2022-09-28 DIAGNOSIS — O26893 Other specified pregnancy related conditions, third trimester: Secondary | ICD-10-CM | POA: Insufficient documentation

## 2022-09-28 DIAGNOSIS — K529 Noninfective gastroenteritis and colitis, unspecified: Secondary | ICD-10-CM | POA: Insufficient documentation

## 2022-09-28 DIAGNOSIS — R1032 Left lower quadrant pain: Secondary | ICD-10-CM | POA: Diagnosis not present

## 2022-09-28 DIAGNOSIS — O99613 Diseases of the digestive system complicating pregnancy, third trimester: Secondary | ICD-10-CM | POA: Insufficient documentation

## 2022-09-28 DIAGNOSIS — Z79899 Other long term (current) drug therapy: Secondary | ICD-10-CM | POA: Insufficient documentation

## 2022-09-28 DIAGNOSIS — O0990 Supervision of high risk pregnancy, unspecified, unspecified trimester: Secondary | ICD-10-CM

## 2022-09-28 DIAGNOSIS — O09521 Supervision of elderly multigravida, first trimester: Secondary | ICD-10-CM

## 2022-09-28 DIAGNOSIS — Z3A30 30 weeks gestation of pregnancy: Secondary | ICD-10-CM | POA: Insufficient documentation

## 2022-09-28 DIAGNOSIS — O4700 False labor before 37 completed weeks of gestation, unspecified trimester: Secondary | ICD-10-CM | POA: Insufficient documentation

## 2022-09-28 DIAGNOSIS — F419 Anxiety disorder, unspecified: Secondary | ICD-10-CM | POA: Insufficient documentation

## 2022-09-28 LAB — CBC WITH DIFFERENTIAL/PLATELET
Abs Immature Granulocytes: 0.03 10*3/uL (ref 0.00–0.07)
Basophils Absolute: 0 10*3/uL (ref 0.0–0.1)
Basophils Relative: 0 %
Eosinophils Absolute: 0.3 10*3/uL (ref 0.0–0.5)
Eosinophils Relative: 3 %
HCT: 31.2 % — ABNORMAL LOW (ref 36.0–46.0)
Hemoglobin: 10.2 g/dL — ABNORMAL LOW (ref 12.0–15.0)
Immature Granulocytes: 0 %
Lymphocytes Relative: 19 %
Lymphs Abs: 1.7 10*3/uL (ref 0.7–4.0)
MCH: 29.9 pg (ref 26.0–34.0)
MCHC: 32.7 g/dL (ref 30.0–36.0)
MCV: 91.5 fL (ref 80.0–100.0)
Monocytes Absolute: 0.4 10*3/uL (ref 0.1–1.0)
Monocytes Relative: 5 %
Neutro Abs: 6.2 10*3/uL (ref 1.7–7.7)
Neutrophils Relative %: 73 %
Platelets: 268 10*3/uL (ref 150–400)
RBC: 3.41 MIL/uL — ABNORMAL LOW (ref 3.87–5.11)
RDW: 14.7 % (ref 11.5–15.5)
WBC: 8.6 10*3/uL (ref 4.0–10.5)
nRBC: 0 % (ref 0.0–0.2)

## 2022-09-28 LAB — COMPREHENSIVE METABOLIC PANEL
ALT: 11 U/L (ref 0–44)
AST: 13 U/L — ABNORMAL LOW (ref 15–41)
Albumin: 2.7 g/dL — ABNORMAL LOW (ref 3.5–5.0)
Alkaline Phosphatase: 74 U/L (ref 38–126)
Anion gap: 6 (ref 5–15)
BUN: 8 mg/dL (ref 6–20)
CO2: 21 mmol/L — ABNORMAL LOW (ref 22–32)
Calcium: 8.5 mg/dL — ABNORMAL LOW (ref 8.9–10.3)
Chloride: 109 mmol/L (ref 98–111)
Creatinine, Ser: 0.58 mg/dL (ref 0.44–1.00)
GFR, Estimated: >60 mL/min (ref >60)
Glucose, Bld: 88 mg/dL (ref 70–99)
Potassium: 3.5 mmol/L (ref 3.5–5.1)
Sodium: 136 mmol/L (ref 135–145)
Total Bilirubin: 0.3 mg/dL (ref 0.3–1.2)
Total Protein: 6.3 g/dL — ABNORMAL LOW (ref 6.5–8.1)

## 2022-09-28 LAB — URINALYSIS, ROUTINE W REFLEX MICROSCOPIC
Bilirubin Urine: NEGATIVE
Glucose, UA: NEGATIVE mg/dL
Hgb urine dipstick: NEGATIVE
Ketones, ur: NEGATIVE mg/dL
Nitrite: NEGATIVE
Protein, ur: NEGATIVE mg/dL
Specific Gravity, Urine: 1.011 (ref 1.005–1.030)
pH: 7 (ref 5.0–8.0)

## 2022-09-28 LAB — AMYLASE: Amylase: 72 U/L (ref 28–100)

## 2022-09-28 LAB — LIPASE, BLOOD: Lipase: 34 U/L (ref 11–51)

## 2022-09-28 MED ORDER — LACTATED RINGERS BOLUS PEDS
1000.0000 mL | Freq: Once | INTRAVENOUS | Status: AC
  Administered 2022-09-28: 1000 mL via INTRAVENOUS

## 2022-09-28 NOTE — MAU Provider Note (Signed)
History     CSN: [id number]  Arrival date and time: 09/28/22 1287   Event Date/Time   First Provider Initiated Contact with Patient 09/28/22 1001      Chief Complaint  Patient presents with   Abdominal Pain   Diarrhea   Mary [name] is a 36 y.o. [id number] at [redacted]w[redacted]d who presents today with LLQ pain that started around 0200 this mroning when she woke up with diarrhea. She states that when she would get up and move the pain was worse, but resting/laying down seem to make it better. Then around 0730 she was getting in the car to take her kids to school and the pain became much worse.   She is unsure if she having contractions, but she has been having braxton hicks contractions for a couple of weeks. She denies any vaginal bleeding. She reports that she has had some clear discharge for a couple of months, but she only notices this about once per week. It is not daily, and does not require a pad.  She reports normal fetal movement.   Abdominal Pain This is a new problem. The current episode started today. The onset quality is sudden. The problem occurs intermittently. The problem has been gradually worsening. The pain is located in the LLQ. The pain is at a severity of 10/10. The quality of the pain is sharp (pulling sensation). Associated symptoms include diarrhea. The pain is aggravated by movement. Relieved by: patient reports that she took a xanax this morning. She states that his helps her anxiety, but also helps her muscles relax.  Diarrhea  This is a new problem. The current episode started today. The problem occurs 2 to 4 times per day. The problem has been unchanged. Diarrhea characteristics: soft. The patient states that diarrhea awakens her from sleep. Associated symptoms include abdominal pain. Nothing aggravates the symptoms. Risk factors: had bday cake, and reports that her son reported a stomach ache and diarrhea after eating the cake as well.    OB History     Gravida  6   Para   4   Term  4   Preterm      AB  1   Living  4      SAB  1   IAB      Ectopic      Multiple  0   Live Births  4           Past Medical History:  Diagnosis Date   Asthma    Bronchitis 01/31/2016   Chiari malformation type I (HCC)    Depression    GERD (gastroesophageal reflux disease)    H/O multiple concussions    Headache(784.0)    History of palpitations    Hx of fracture of nose    Mental disorder    Multiple fractures    Hx: of a leg and arm fracture as a child   Pneumonia    PONV (postoperative nausea and vomiting)    Post traumatic stress disorder (PTSD)    Pregnant 01/21/2016   PTSD (post-traumatic stress disorder)    Sinus infection 01/31/2016   Supervision of normal pregnancy, antepartum 02/08/2016    [facility] at             13+1 week FOB   [name] 36 yo WM Dating By  LMP and [place] Pap  02/08/16 GC/CT Initial:  36+wks: Genetic Screen NT/IT:  CF screen  Anatomic [place]  Flu vaccine  Tdap Recommended ~ 28wks Glucose Screen  2 hr GBS  Feed Preference  Contraception  Circumcision  Childbirth Classes  Pediatrician      Past Surgical History:  Procedure Laterality Date   botox injections     colposcopy     DILATION AND CURETTAGE OF UTERUS     nexplanon     SUBOCCIPITAL CRANIECTOMY CERVICAL LAMINECTOMY N/A 12/16/2018   Procedure: Cervical one Laminectomy/Chiari decompression;  Surgeon: Donalee Citrin, MD;  Location: [facility] OR;  Service: Neurosurgery;  Laterality: N/A;   WISDOM TOOTH EXTRACTION      Family History  Problem Relation Age of Onset   Cancer Mother 16       breast   Hypertension Mother    Depression Mother    Depression Father    Seizures Father    Cancer Maternal Grandmother        breast and cervical   Asthma Son    Asthma Son    Drug abuse Paternal Grandmother    Alcohol abuse Paternal Grandmother    Hypertension Other    Cancer Other     Social History   Tobacco Use   Smoking status: Every Day     Packs/day: 0.50    Types: Cigarettes   Smokeless tobacco: Never   Tobacco comments:    NEVER USE SNUFF OR CHEWING TOBACCO  Vaping Use   Vaping Use: Never used  Substance Use Topics   Alcohol use: Not Currently    Comment: social   Drug use: No    Allergies:  Allergies  Allergen Reactions   Codeine Shortness Of Breath and Other (See Comments)    Can not take in liquid form but does not recall exactly what formulation she had Has taken vicodin and percocet without problem per pt   Influenza A (H1n1) Monovalent Vaccine Anaphylaxis   Peanut-Containing Drug Products Anaphylaxis   Sulfa Antibiotics Anaphylaxis and Itching   Robaxin [Methocarbamol] Other (See Comments)    Migraine    Medications Prior to Admission  Medication Sig Dispense Refill Last Dose   albuterol (VENTOLIN HFA) 108 (90 Base) MCG/ACT inhaler Inhale 2 puffs into the lungs every 6 (six) hours as needed for wheezing or shortness of breath. 1 g 2 09/28/2022   ALPRAZolam (XANAX) 0.5 MG tablet Take by mouth.   09/28/2022   aspirin-acetaminophen-caffeine (EXCEDRIN MIGRAINE) 250-250-65 MG tablet Take by mouth every 6 (six) hours as needed for headache.   Past Month   cyclobenzaprine (FLEXERIL) 5 MG tablet Take 1 tablet (5 mg total) by mouth 3 (three) times daily as needed for muscle spasms. 90 tablet 5 Past Week   prenatal vitamin w/FE, FA (PRENATAL 1 + 1) 27-1 MG TABS tablet Take 1 tablet by mouth daily at 12 noon. 30 tablet 12 09/27/2022   propranolol (INDERAL) 10 MG tablet Take 1 tablet (10 mg total) by mouth daily. 30 tablet 1     Review of Systems  Gastrointestinal:  Positive for abdominal pain and diarrhea.  All other systems reviewed and are negative.  Physical Exam   Blood pressure 109/78, pulse (!) 115, temperature 98.9 F (37.2 C), temperature source Oral, resp. rate 16, height 5\' 4"  (1.626 m), weight 76.1 kg, last menstrual period 02/26/2022, SpO2 97 %.  Physical Exam Constitutional:      Appearance:  She is well-developed.  HENT:     Head: Normocephalic.  Eyes:     Pupils:  Pupils are equal, round, and reactive to light.  Cardiovascular:     Rate and Rhythm: Normal rate and regular rhythm.     Heart sounds: Normal heart sounds.  Pulmonary:     Effort: Pulmonary effort is normal. No respiratory distress.     Breath sounds: Normal breath sounds.  Abdominal:     Palpations: Abdomen is soft.     Tenderness: There is no abdominal tenderness.  Genitourinary:    Vagina: No bleeding. Vaginal discharge: mucusy.    Comments: External: no lesion Vagina: small amount of white discharge     Musculoskeletal:        General: Normal range of motion.     Cervical back: Normal range of motion and neck supple.  Skin:    General: Skin is warm and dry.  Neurological:     Mental Status: She is alert and oriented to person, place, and time.  Psychiatric:        Mood and Affect: Mood normal.        Behavior: Behavior normal.     NST:  Baseline: 135 Variability: moderate Accels: 15x15 Decels: none Toco: none Reactive/Appropriate for GA   Results for orders placed or performed during the hospital encounter of 09/28/22 (from the past 24 hour(s))  Urinalysis, Routine w reflex microscopic Urine, Clean Catch     Status: Abnormal   Collection Time: 09/28/22 10:01 AM  Result Value Ref Range   Color, Urine YELLOW YELLOW   APPearance CLEAR CLEAR   Specific Gravity, Urine 1.011 1.005 - 1.030   pH 7.0 5.0 - 8.0   Glucose, UA NEGATIVE NEGATIVE mg/dL   Hgb urine dipstick NEGATIVE NEGATIVE   Bilirubin Urine NEGATIVE NEGATIVE   Ketones, ur NEGATIVE NEGATIVE mg/dL   Protein, ur NEGATIVE NEGATIVE mg/dL   Nitrite NEGATIVE NEGATIVE   Leukocytes,Ua TRACE (A) NEGATIVE   RBC / HPF 0-5 0 - 5 RBC/hpf   WBC, UA 0-5 0 - 5 WBC/hpf   Bacteria, UA RARE (A) NONE SEEN   Squamous Epithelial / LPF 0-5 0 - 5   Mucus PRESENT   CBC with Differential/Platelet     Status: Abnormal   Collection Time: 09/28/22  10:26 AM  Result Value Ref Range   WBC 8.6 4.0 - 10.5 K/uL   RBC 3.41 (L) 3.87 - 5.11 MIL/uL   Hemoglobin 10.2 (L) 12.0 - 15.0 g/dL   HCT 31.2 (L) 36.0 - 46.0 %   MCV 91.5 80.0 - 100.0 fL   MCH 29.9 26.0 - 34.0 pg   MCHC 32.7 30.0 - 36.0 g/dL   RDW 14.7 11.5 - 15.5 %   Platelets 268 150 - 400 K/uL   nRBC 0.0 0.0 - 0.2 %   Neutrophils Relative % 73 %   Neutro Abs 6.2 1.7 - 7.7 K/uL   Lymphocytes Relative 19 %   Lymphs Abs 1.7 0.7 - 4.0 K/uL   Monocytes Relative 5 %   Monocytes Absolute 0.4 0.1 - 1.0 K/uL   Eosinophils Relative 3 %   Eosinophils Absolute 0.3 0.0 - 0.5 K/uL   Basophils Relative 0 %   Basophils Absolute 0.0 0.0 - 0.1 K/uL   Immature Granulocytes 0 %   Abs Immature Granulocytes 0.03 0.00 - 0.07 K/uL  Comprehensive metabolic panel     Status: Abnormal   Collection Time: 09/28/22 10:26 AM  Result Value Ref Range   Sodium 136 135 - 145 mmol/L   Potassium 3.5 3.5 - 5.1 mmol/L   Chloride 109  98 - 111 mmol/L   CO2 21 (L) 22 - 32 mmol/L   Glucose, Bld 88 70 - 99 mg/dL   BUN 8 6 - 20 mg/dL   Creatinine, Ser 0.09 0.44 - 1.00 mg/dL   Calcium 8.5 (L) 8.9 - 10.3 mg/dL   Total Protein 6.3 (L) 6.5 - 8.1 g/dL   Albumin 2.7 (L) 3.5 - 5.0 g/dL   AST 13 (L) 15 - 41 U/L   ALT 11 0 - 44 U/L   Alkaline Phosphatase 74 38 - 126 U/L   Total Bilirubin 0.3 0.3 - 1.2 mg/dL   GFR, Estimated >38 >18 mL/min   Anion gap 6 5 - 15  Amylase     Status: None   Collection Time: 09/28/22 10:26 AM  Result Value Ref Range   Amylase 72 28 - 100 U/L  Lipase, blood     Status: None   Collection Time: 09/28/22 10:26 AM  Result Value Ref Range   Lipase 34 11 - 51 U/L     MAU Course  Procedures  MDM Patient has had LR bolus and reports that she is feeling better. She is not having any further diarrhea since being here. Had patient move around the room since that seems to make the pain worse. Pain did not return. DW patient that likely related to diarrhea and dehydration.   Assessment and  Plan   1. AMA (advanced maternal age) multigravida 35+, first trimester   2. Supervision of high risk pregnancy, antepartum   3. Gastroenteritis   4. [redacted] weeks gestation of pregnancy    DC home in stable condition  Comfort measures reviewed  3rd Trimester precautions  PTL precautions  Fetal kick counts RX: none  Return to MAU as needed FU with OB as planned   Follow-up Information     [facility] Follow up.   Specialty: Obstetrics and Gynecology Contact information: [address] [place] [address] [phone]               Thressa Sheller DNP, CNM  09/28/22  12:03 PM

## 2022-09-28 NOTE — MAU Note (Signed)
Mary [name] is a 36 y.o. at [redacted]w[redacted]d here in MAU reporting: intermittent left lower abdominal pain started at 0200 this morning. Pain is now radiating towards belly button. States pain improves when she sits down. Also was having diarrhea since 0200. Last episode was around 0800.  Onset of complaint: today  Pain score: 6/10  Vitals:   09/28/22 0940  BP: 123/73  Pulse: 100  Resp: 16  Temp: 98.9 F (37.2 C)  SpO2: 97%     FHT:139, +FM  Lab orders placed from triage: UA

## 2022-10-02 ENCOUNTER — Ambulatory Visit: Payer: Medicare Other | Admitting: *Deleted

## 2022-10-02 ENCOUNTER — Ambulatory Visit: Payer: Medicare Other | Attending: Obstetrics and Gynecology

## 2022-10-02 VITALS — BP 112/82 | HR 100

## 2022-10-02 DIAGNOSIS — O99513 Diseases of the respiratory system complicating pregnancy, third trimester: Secondary | ICD-10-CM | POA: Diagnosis not present

## 2022-10-02 DIAGNOSIS — O09523 Supervision of elderly multigravida, third trimester: Secondary | ICD-10-CM | POA: Insufficient documentation

## 2022-10-02 DIAGNOSIS — O0990 Supervision of high risk pregnancy, unspecified, unspecified trimester: Secondary | ICD-10-CM | POA: Insufficient documentation

## 2022-10-02 DIAGNOSIS — O09521 Supervision of elderly multigravida, first trimester: Secondary | ICD-10-CM | POA: Insufficient documentation

## 2022-10-02 DIAGNOSIS — Z3A31 31 weeks gestation of pregnancy: Secondary | ICD-10-CM

## 2022-10-02 DIAGNOSIS — J45909 Unspecified asthma, uncomplicated: Secondary | ICD-10-CM | POA: Diagnosis not present

## 2022-10-04 ENCOUNTER — Ambulatory Visit: Payer: Medicare Other | Admitting: Physical Therapy

## 2022-10-04 ENCOUNTER — Encounter: Payer: Self-pay | Admitting: Physical Therapy

## 2022-10-04 DIAGNOSIS — G935 Compression of brain: Secondary | ICD-10-CM | POA: Diagnosis not present

## 2022-10-04 DIAGNOSIS — R293 Abnormal posture: Secondary | ICD-10-CM | POA: Diagnosis not present

## 2022-10-04 DIAGNOSIS — M542 Cervicalgia: Secondary | ICD-10-CM | POA: Diagnosis not present

## 2022-10-04 DIAGNOSIS — G43109 Migraine with aura, not intractable, without status migrainosus: Secondary | ICD-10-CM | POA: Diagnosis not present

## 2022-10-04 NOTE — Therapy (Signed)
OUTPATIENT PHYSICAL THERAPY CERVICAL EVALUATION   Patient Name: Mary [name] MRN: [id number] DOB:1986/07/28, 36 y.o., female Today's Date: 10/04/2022   PT End of Session - 10/04/22 0903     Visit Number 3    Number of Visits 12    Date for PT Re-Evaluation 12/08/22    PT Start Time 0904    PT Stop Time 0951    PT Time Calculation (min) 47 min    Activity Tolerance Patient tolerated treatment well    Behavior During Therapy [facility] for tasks assessed/performed            Past Medical History:  Diagnosis Date   Asthma    Bronchitis 01/31/2016   Chiari malformation type I ([place])    Depression    GERD (gastroesophageal reflux disease)    H/O multiple concussions    Headache(784.0)    History of palpitations    Hx of fracture of nose    Mental disorder    Multiple fractures    Hx: of a leg and arm fracture as a child   Pneumonia    PONV (postoperative nausea and vomiting)    Post traumatic stress disorder (PTSD)    Pregnant 01/21/2016   PTSD (post-traumatic stress disorder)    Sinus infection 01/31/2016   Supervision of normal pregnancy, antepartum 02/08/2016    [facility] at             13+1 week FOB   Ventura Sellers 36 yo WM Dating By  LMP and [place] Pap  02/08/16 GC/CT Initial:                36+wks: Genetic Screen NT/IT:  CF screen  Anatomic [place]  Flu vaccine  Tdap Recommended ~ 28wks Glucose Screen  2 hr GBS  Feed Preference  Contraception  Circumcision  Childbirth Classes  Pediatrician     Past Surgical History:  Procedure Laterality Date   botox injections     colposcopy     DILATION AND CURETTAGE OF UTERUS     nexplanon     SUBOCCIPITAL CRANIECTOMY CERVICAL LAMINECTOMY N/A 12/16/2018   Procedure: Cervical one Laminectomy/Chiari decompression;  Surgeon: Kary Kos, MD;  Location: [place];  Service: Neurosurgery;  Laterality: N/A;   WISDOM TOOTH EXTRACTION     Patient Active Problem List   Diagnosis Date Noted   Encounter to establish care 09/25/2022    Atypical chest pain 09/25/2022   Migraine with aura and without status migrainosus 07/24/2022   AMA (advanced maternal age) multigravida 35+, first trimester 05/17/2022   Supervision of high risk pregnancy, antepartum 05/17/2022   History of posttraumatic stress disorder (PTSD) 04/25/2022   Vitamin D deficiency 09/11/2019   Hyperlipidemia 09/11/2019   Palpitations 07/30/2018   Chiari I malformation ([place]) 02/13/2018   Current smoker 04/20/2017   CIN I (cervical intraepithelial neoplasia I) 06/28/2015   ASCUS with positive high risk HPV 07/08/2013   Asthma 06/04/2013   Depression 06/04/2013   REFERRING PROVIDER: Pieter Partridge, DO  REFERRING DIAG: Cervicalgia; Migraine with aura and without status migrainosus, not intractable; Chiari I malformation; [redacted] weeks gestation of pregnancy  THERAPY DIAG:  Cervicalgia  Abnormal posture  Rationale for Evaluation and Treatment Rehabilitation  ONSET DATE: August 2023  SUBJECTIVE:  SUBJECTIVE STATEMENT: Reports more tension and tightness in neck.  PERTINENT HISTORY:  History of TIA, asthma, Chiari malformation, depression, smoker, PTSD, and patient is currently pregnant  PAIN:  Are you having pain? Yes: NPRS scale: 4/10 Pain location: neck and back Pain description: stabbing and shooting (intermittent)  Aggravating factors: turning her head (right > left),looking down or up  Relieving factors: medication and heat  PRECAUTIONS: Other: pregnant (29 weeks; boy to be named Camera operator).  Chiari I malformation.  WEIGHT BEARING RESTRICTIONS No  PATIENT GOALS be able to wash dishes, clean her house, make her bed, wash her hair (she requires additional assistance to perform all of these activities from her family)   OBJECTIVE:   TODAY'S TREATMENT:   Modalities  Date: 10/24 Unattended Estim: Cervical, Pre-Mod, 15 mins, Pain and Tone Hot Pack: Cervical, 15 mins, Pain and Tone  Manual Therapy Soft Tissue Mobilization: B UT, cervical paraspinals, STW to reduce pain and muscle tightness    ASSESSMENT:  CLINICAL IMPRESSION: Patient presented in clinic with reports of muscle tightness. States that she hasn't had much stress but has been experiencing a very different pregnancy than before. Patient opted to avoid exercises today. Increased muscle tightness palpable in UT area. No complaints during manual therapy session today. Normal modalities response noted following removal of the modalities.  GOALS: Goals reviewed with patient? No  SHORT TERM GOALS: Target date: 10/09/2022   Patient will be independent with her initial HEP.  Baseline:   Goal status: INITIAL  2.  Patient will be able to perform her daily activities without her familiar pain exceeding 5/10. Baseline:   Goal status: INITIAL  3.  Patient will be able to demonstrate at least 60 degrees of bilateral cervical rotation without reproducing her familiar cervical pain.  Baseline:  Goal status: INITIAL  LONG TERM GOALS: Target date: 10/30/2022  Patient will be independent with her advanced HEP.  Baseline:   Goal status: INITIAL  2.  Patient will be able to demonstrate at least 35 degrees of active cervical flexion without being limited by her familiar symptoms for improved function cleaning.  Baseline:   Goal status: INITIAL  3.  Patient will be able to clean her house without her familiar cervical pain exceeding 3/10.  Baseline:   Goal status: INITIAL  4.  Patient will be able to simulate washing her hair without being limited by her familiar symptoms for improved independence.  Baseline:   Goal status: INITIAL  PLAN: PT FREQUENCY: 2x/week  PT DURATION: 6 weeks  PLANNED INTERVENTIONS: Therapeutic exercises, Therapeutic activity, Neuromuscular  re-education, Patient/Family education, Self Care, Joint mobilization, Dry Needling, Electrical stimulation, Spinal mobilization, Cryotherapy, Moist heat, Manual therapy, and Re-evaluation  PLAN FOR NEXT SESSION: UBE, manual cervical traction, manual therapy, rows, chin tucks, and other appropriately matched interventions    Standley Brooking, PTA 10/04/2022, 9:57 AM

## 2022-10-09 ENCOUNTER — Other Ambulatory Visit: Payer: Self-pay | Admitting: *Deleted

## 2022-10-09 DIAGNOSIS — O0990 Supervision of high risk pregnancy, unspecified, unspecified trimester: Secondary | ICD-10-CM

## 2022-10-11 ENCOUNTER — Other Ambulatory Visit: Payer: Medicare Other

## 2022-10-11 ENCOUNTER — Encounter: Payer: Self-pay | Admitting: Family Medicine

## 2022-10-19 ENCOUNTER — Ambulatory Visit (INDEPENDENT_AMBULATORY_CARE_PROVIDER_SITE_OTHER): Payer: Medicare Other | Admitting: Student

## 2022-10-19 ENCOUNTER — Other Ambulatory Visit: Payer: Self-pay

## 2022-10-19 ENCOUNTER — Other Ambulatory Visit: Payer: Medicare Other

## 2022-10-19 VITALS — BP 110/75 | HR 83 | Wt 168.6 lb

## 2022-10-19 DIAGNOSIS — G935 Compression of brain: Secondary | ICD-10-CM

## 2022-10-19 DIAGNOSIS — O09523 Supervision of elderly multigravida, third trimester: Secondary | ICD-10-CM

## 2022-10-19 DIAGNOSIS — E559 Vitamin D deficiency, unspecified: Secondary | ICD-10-CM

## 2022-10-19 DIAGNOSIS — R002 Palpitations: Secondary | ICD-10-CM

## 2022-10-19 DIAGNOSIS — O0990 Supervision of high risk pregnancy, unspecified, unspecified trimester: Secondary | ICD-10-CM

## 2022-10-19 DIAGNOSIS — O0993 Supervision of high risk pregnancy, unspecified, third trimester: Secondary | ICD-10-CM

## 2022-10-19 DIAGNOSIS — O09521 Supervision of elderly multigravida, first trimester: Secondary | ICD-10-CM

## 2022-10-19 DIAGNOSIS — F32A Depression, unspecified: Secondary | ICD-10-CM | POA: Diagnosis not present

## 2022-10-19 DIAGNOSIS — Z3A33 33 weeks gestation of pregnancy: Secondary | ICD-10-CM

## 2022-10-19 DIAGNOSIS — H6993 Unspecified Eustachian tube disorder, bilateral: Secondary | ICD-10-CM

## 2022-10-19 NOTE — Progress Notes (Signed)
   PRENATAL VISIT NOTE  Subjective:  Mary [name] is a 36 y.o. [phone] at [redacted]w[redacted]d being seen today for ongoing prenatal care.  She is currently monitored for the following issues for this high-risk pregnancy and has Asthma; Depression; Current smoker; Chiari I malformation (HCC); Palpitations; ASCUS with positive high risk HPV; CIN I (cervical intraepithelial neoplasia I); Vitamin D deficiency; Hyperlipidemia; History of posttraumatic stress disorder (PTSD); AMA (advanced maternal age) multigravida 35+, first trimester; Supervision of high risk pregnancy, antepartum; Migraine with aura and without status migrainosus; Encounter to establish care; and Atypical chest pain on their problem list.  Patient reports  that her head feels like "a bucket full of fluid" and that her ears feel full of fluid. Patient states that symptoms have continued to worsen since last OB visit in September. Patient has tried Flonase and saline washes without relief .  Patient has been experiencing dContractions: Irritability. Vag. Bleeding: None.  Movement: Present. Denies leaking of fluid.   The following portions of the patient's history were reviewed and updated as appropriate: allergies, current medications, past family history, past medical history, past social history, past surgical history and problem list.   Objective:   Vitals:   10/19/22 0940  BP: 110/75  Pulse: 83  Weight: 168 lb 9.6 oz (76.5 kg)    Fetal Status: Fetal Heart Rate (bpm): 136   Movement: Present     General:  Alert, oriented and cooperative. Patient is in no acute distress.  Skin: Skin is warm and dry. No rash noted.   Cardiovascular: Normal heart rate noted  Respiratory: Normal respiratory effort, no problems with respiration noted  Abdomen: Soft, gravid, appropriate for gestational age.  Pain/Pressure: Present     Pelvic: Cervical exam deferred        Extremities: Normal range of motion.  Edema: None  Mental Status: Normal mood and  affect. Normal behavior. Normal judgment and thought content.   Assessment and Plan:  Pregnancy: [id number] at [redacted]w[redacted]d 1. Supervision of high risk pregnancy, antepartum - Frequent and vigorous fetal movement  2. [redacted] weeks gestation of pregnancy - Third trimester labs collected today  3. AMA (advanced maternal age) multigravida 35+, first trimester - Growth scan was normal  4. Depression, unspecified depression type - mood is stable and appropriate at today's visit  5. Chiari I malformation (HCC) - Ambulatory referral to ENT  6. Eustachian tube dysfunction, bilateral - Ambulatory referral to ENT  7. Palpitations - Has follow-up visit with Cards on 11/07/22 - Beta-blocker recommended at prior visit with cardiology, patient is not comfortable with taking this medication due to hx. of asthma - Provider to connect with cardiology team to support management plan  Preterm labor symptoms and general obstetric precautions including but not limited to vaginal bleeding, contractions, leaking of fluid and fetal movement were reviewed in detail with the patient. Please refer to After Visit Summary for other counseling recommendations.   No follow-ups on file.  Future Appointments  Date Time Provider Department Center  11/06/2022  1:15 PM Alfredia Ferguson, MD [facility]  11/07/2022 10:20 AM Tobb, Lavona Mound, DO [facility] None  11/13/2022 10:35 AM Lennart Pall, MD [facility]  11/20/2022 11:15 AM Lennart Pall, MD [facility]  11/27/2022 11:15 AM Mora Appl [facility]  12/07/2022 10:15 AM [facility]  12/07/2022 11:15 AM Alfredia Ferguson, MD [facility]  12/18/2022 10:10 AM Drema Dallas, DO [facility] None    Corlis Hove, NP

## 2022-10-20 LAB — CBC
Hematocrit: 32.1 % — ABNORMAL LOW (ref 34.0–46.6)
Hemoglobin: 10.7 g/dL — ABNORMAL LOW (ref 11.1–15.9)
MCH: 29.6 pg (ref 26.6–33.0)
MCHC: 33.3 g/dL (ref 31.5–35.7)
MCV: 89 fL (ref 79–97)
Platelets: 255 10*3/uL (ref 150–450)
RBC: 3.61 x10E6/uL — ABNORMAL LOW (ref 3.77–5.28)
RDW: 13.5 % (ref 11.7–15.4)
WBC: 9 10*3/uL (ref 3.4–10.8)

## 2022-10-20 LAB — GLUCOSE TOLERANCE, 2 HOURS W/ 1HR
Glucose, 1 hour: 153 mg/dL (ref 70–179)
Glucose, 2 hour: 91 mg/dL (ref 70–152)
Glucose, Fasting: 72 mg/dL (ref 70–91)

## 2022-10-20 LAB — TSH RFX ON ABNORMAL TO FREE T4: TSH: 2.76 u[IU]/mL (ref 0.450–4.500)

## 2022-10-20 LAB — RPR: RPR Ser Ql: NONREACTIVE

## 2022-10-20 LAB — VITAMIN B12: Vitamin B-12: 268 pg/mL (ref 232–1245)

## 2022-10-20 LAB — VITAMIN D 25 HYDROXY (VIT D DEFICIENCY, FRACTURES): Vit D, 25-Hydroxy: 24.5 ng/mL — ABNORMAL LOW (ref 30.0–100.0)

## 2022-10-20 LAB — HIV ANTIBODY (ROUTINE TESTING W REFLEX): HIV Screen 4th Generation wRfx: NONREACTIVE

## 2022-10-25 ENCOUNTER — Ambulatory Visit: Payer: Medicare Other | Attending: Neurology

## 2022-10-25 DIAGNOSIS — M542 Cervicalgia: Secondary | ICD-10-CM | POA: Insufficient documentation

## 2022-10-25 DIAGNOSIS — R293 Abnormal posture: Secondary | ICD-10-CM | POA: Diagnosis not present

## 2022-10-25 NOTE — Therapy (Signed)
OUTPATIENT PHYSICAL THERAPY CERVICAL EVALUATION   Patient Name: Margree Gimbel MRN: [id number] DOB:05-09-1986, 36 y.o., female Today's Date: 10/25/2022   PT End of Session - 10/25/22 0953     Visit Number 4    Number of Visits 12    Date for PT Re-Evaluation 12/08/22    PT Start Time 0945    PT Stop Time 1035    PT Time Calculation (min) 50 min    Activity Tolerance Patient tolerated treatment well    Behavior During Therapy [facility] for tasks assessed/performed            Past Medical History:  Diagnosis Date   Asthma    Bronchitis 01/31/2016   Chiari malformation type I (HCC)    Depression    GERD (gastroesophageal reflux disease)    H/O multiple concussions    Headache(784.0)    History of palpitations    Hx of fracture of nose    Mental disorder    Multiple fractures    Hx: of a leg and arm fracture as a child   Pneumonia    PONV (postoperative nausea and vomiting)    Post traumatic stress disorder (PTSD)    Pregnant 01/21/2016   PTSD (post-traumatic stress disorder)    Sinus infection 01/31/2016   Supervision of normal pregnancy, antepartum 02/08/2016    [facility] at             13+1 week FOB   [name] 36 yo WM Dating By  LMP and [place] Pap  02/08/16 GC/CT Initial:                36+wks: Genetic Screen NT/IT:  CF screen  Anatomic [place]  Flu vaccine  Tdap Recommended ~ 28wks Glucose Screen  2 hr GBS  Feed Preference  Contraception  Circumcision  Childbirth Classes  Pediatrician     Past Surgical History:  Procedure Laterality Date   botox injections     colposcopy     DILATION AND CURETTAGE OF UTERUS     nexplanon     SUBOCCIPITAL CRANIECTOMY CERVICAL LAMINECTOMY N/A 12/16/2018   Procedure: Cervical one Laminectomy/Chiari decompression;  Surgeon: Donalee Citrin, MD;  Location: [facility] OR;  Service: Neurosurgery;  Laterality: N/A;   WISDOM TOOTH EXTRACTION     Patient Active Problem List   Diagnosis Date Noted   Encounter to establish care 09/25/2022    Atypical chest pain 09/25/2022   Migraine with aura and without status migrainosus 07/24/2022   AMA (advanced maternal age) multigravida 35+, first trimester 05/17/2022   Supervision of high risk pregnancy, antepartum 05/17/2022   History of posttraumatic stress disorder (PTSD) 04/25/2022   Vitamin D deficiency 09/11/2019   Hyperlipidemia 09/11/2019   Palpitations 07/30/2018   Chiari I malformation (HCC) 02/13/2018   Current smoker 04/20/2017   CIN I (cervical intraepithelial neoplasia I) 06/28/2015   ASCUS with positive high risk HPV 07/08/2013   Asthma 06/04/2013   Depression 06/04/2013   REFERRING PROVIDER: Drema Dallas, DO  REFERRING DIAG: Cervicalgia; Migraine with aura and without status migrainosus, not intractable; Chiari I malformation; [redacted] weeks gestation of pregnancy  THERAPY DIAG:  Cervicalgia  Abnormal posture  Rationale for Evaluation and Treatment Rehabilitation  ONSET DATE: August 2023  SUBJECTIVE:  SUBJECTIVE STATEMENT: Reports more tension and tightness in neck.  PERTINENT HISTORY:  History of TIA, asthma, Chiari malformation, depression, smoker, PTSD, and patient is currently pregnant  PAIN:  Are you having pain? Yes: NPRS scale: 4/10 Pain location: neck and back Pain description: stabbing and shooting (intermittent)  Aggravating factors: turning her head (right > left),looking down or up  Relieving factors: medication and heat  PRECAUTIONS: Other: pregnant (29 weeks; boy to be named Camera operator).  Chiari I malformation.  WEIGHT BEARING RESTRICTIONS No  PATIENT GOALS be able to wash dishes, clean her house, make her bed, wash her hair (she requires additional assistance to perform all of these activities from her family)   OBJECTIVE:   TODAY'S TREATMENT:   Modalities  Date: 11/15 Unattended Estim: Cervical, Pre-Mod, 15 mins, Pain and Tone Hot Pack: Cervical, 15 mins, Pain and Tone  Manual Therapy Soft Tissue Mobilization: B UT, cervical paraspinals, STW to reduce pain and muscle tightness    ASSESSMENT:  CLINICAL IMPRESSION: Pt arrives for today's treatment session reporting 4/10 neck pain.  Pt opted out of exercises today due to increased pain level.  STW/M performed to bil UT and cervical paraspinals to decrease pain and tone. Pt reported relief with manual therapy.  Normal responses to estim and MH noted upon removal.  Pt reported 2/10 neck pain at completion of today's treatment session.  GOALS: Goals reviewed with patient? No  SHORT TERM GOALS: Target date: 10/09/2022   Patient will be independent with her initial HEP.  Baseline:   Goal status: INITIAL  2.  Patient will be able to perform her daily activities without her familiar pain exceeding 5/10. Baseline:   Goal status: INITIAL  3.  Patient will be able to demonstrate at least 60 degrees of bilateral cervical rotation without reproducing her familiar cervical pain.  Baseline:  Goal status: INITIAL  LONG TERM GOALS: Target date: 10/30/2022  Patient will be independent with her advanced HEP.  Baseline:   Goal status: INITIAL  2.  Patient will be able to demonstrate at least 35 degrees of active cervical flexion without being limited by her familiar symptoms for improved function cleaning.  Baseline:   Goal status: INITIAL  3.  Patient will be able to clean her house without her familiar cervical pain exceeding 3/10.  Baseline:   Goal status: INITIAL  4.  Patient will be able to simulate washing her hair without being limited by her familiar symptoms for improved independence.  Baseline:   Goal status: INITIAL  PLAN: PT FREQUENCY: 2x/week  PT DURATION: 6 weeks  PLANNED INTERVENTIONS: Therapeutic exercises, Therapeutic activity, Neuromuscular re-education,  Patient/Family education, Self Care, Joint mobilization, Dry Needling, Electrical stimulation, Spinal mobilization, Cryotherapy, Moist heat, Manual therapy, and Re-evaluation  PLAN FOR NEXT SESSION: UBE, manual cervical traction, manual therapy, rows, chin tucks, and other appropriately matched interventions    Kathrynn Ducking, PTA 10/25/2022, 10:37 AM

## 2022-10-30 ENCOUNTER — Ambulatory Visit: Payer: Medicare Other | Admitting: *Deleted

## 2022-10-30 ENCOUNTER — Encounter: Payer: Self-pay | Admitting: *Deleted

## 2022-10-30 DIAGNOSIS — M542 Cervicalgia: Secondary | ICD-10-CM | POA: Diagnosis not present

## 2022-10-30 DIAGNOSIS — R293 Abnormal posture: Secondary | ICD-10-CM | POA: Diagnosis not present

## 2022-10-30 NOTE — Therapy (Signed)
OUTPATIENT PHYSICAL THERAPY CERVICAL EVALUATION   Patient Name: Mary [name] MRN: [id number] DOB:1986-08-24, 36 y.o., female Today's Date: 10/30/2022   PT End of Session - 10/30/22 0951     Visit Number 5    Number of Visits 12    Date for PT Re-Evaluation 12/08/22    PT Start Time 0951    PT Stop Time 1035    PT Time Calculation (min) 44 min            Past Medical History:  Diagnosis Date   Asthma    Bronchitis 01/31/2016   Chiari malformation type I (HCC)    Depression    GERD (gastroesophageal reflux disease)    H/O multiple concussions    Headache(784.0)    History of palpitations    Hx of fracture of nose    Mental disorder    Multiple fractures    Hx: of a leg and arm fracture as a child   Pneumonia    PONV (postoperative nausea and vomiting)    Post traumatic stress disorder (PTSD)    Pregnant 01/21/2016   PTSD (post-traumatic stress disorder)    Sinus infection 01/31/2016   Supervision of normal pregnancy, antepartum 02/08/2016    [facility] at             13+1 week FOB   [name] 36 yo WM Dating By  LMP and [place] Pap  02/08/16 GC/CT Initial:                36+wks: Genetic Screen NT/IT:  CF screen  Anatomic [place]  Flu vaccine  Tdap Recommended ~ 28wks Glucose Screen  2 hr GBS  Feed Preference  Contraception  Circumcision  Childbirth Classes  Pediatrician     Past Surgical History:  Procedure Laterality Date   botox injections     colposcopy     DILATION AND CURETTAGE OF UTERUS     nexplanon     SUBOCCIPITAL CRANIECTOMY CERVICAL LAMINECTOMY N/A 12/16/2018   Procedure: Cervical one Laminectomy/Chiari decompression;  Surgeon: Donalee Citrin, MD;  Location: [facility] OR;  Service: Neurosurgery;  Laterality: N/A;   WISDOM TOOTH EXTRACTION     Patient Active Problem List   Diagnosis Date Noted   Encounter to establish care 09/25/2022   Atypical chest pain 09/25/2022   Migraine with aura and without status migrainosus 07/24/2022   AMA (advanced  maternal age) multigravida 35+, first trimester 05/17/2022   Supervision of high risk pregnancy, antepartum 05/17/2022   History of posttraumatic stress disorder (PTSD) 04/25/2022   Vitamin D deficiency 09/11/2019   Hyperlipidemia 09/11/2019   Palpitations 07/30/2018   Chiari I malformation (HCC) 02/13/2018   Current smoker 04/20/2017   CIN I (cervical intraepithelial neoplasia I) 06/28/2015   ASCUS with positive high risk HPV 07/08/2013   Asthma 06/04/2013   Depression 06/04/2013   REFERRING PROVIDER: Drema Dallas, DO  REFERRING DIAG: Cervicalgia; Migraine with aura and without status migrainosus, not intractable; Chiari I malformation; [redacted] weeks gestation of pregnancy  THERAPY DIAG:  Cervicalgia  Abnormal posture  Rationale for Evaluation and Treatment Rehabilitation  ONSET DATE: August 2023  SUBJECTIVE:  SUBJECTIVE STATEMENT: Reports more tension and tightness in neck both sides.  PERTINENT HISTORY:  History of TIA, asthma, Chiari malformation, depression, smoker, PTSD, and patient is currently pregnant  PAIN:  Are you having pain? Yes: NPRS scale: 4-5/10 Pain location: neck and back Pain description: stabbing and shooting (intermittent)  Aggravating factors: turning her head (right > left),looking down or up  Relieving factors: medication and heat  PRECAUTIONS: Other: pregnant (29 weeks; boy to be named [employer] logistics/support/administrative officer).  Chiari I malformation.  WEIGHT BEARING RESTRICTIONS No  PATIENT GOALS be able to wash dishes, clean her house, make her bed, wash her hair (she requires additional assistance to perform all of these activities from her family)   OBJECTIVE:   TODAY'S TREATMENT:  Modalities  Date: 11/20 Unattended Estim: Cervical, Pre-Mod, 15 mins, Pain and Tone Hot Pack:  Cervical, 15 mins, Pain and Tone  Manual Therapy Soft Tissue Mobilization: B UT, Levator scap. And  cervical paraspinals, STW and TPR  to reduce pain and muscle tightness    ASSESSMENT:  CLINICAL IMPRESSION: Pt arrives for today's treatment session reporting -5 /10 neck pain.  Pt opted out of exercises at this time due to increased pain level.  Rx focused on Manual STW / TPR to bil UT.'s, Levator scap., and cervical paras with Pt in seated position. Notable tightness/ Tp's each side with good release today. Decreased pain end of session.     GOALS: Goals reviewed with patient? No  SHORT TERM GOALS: Target date: 10/09/2022   Patient will be independent with her initial HEP.  Baseline:   Goal status: INITIAL  2.  Patient will be able to perform her daily activities without her familiar pain exceeding 5/10. Baseline:   Goal status: INITIAL  3.  Patient will be able to demonstrate at least 60 degrees of bilateral cervical rotation without reproducing her familiar cervical pain.  Baseline:  Goal status: INITIAL  LONG TERM GOALS: Target date: 10/30/2022  Patient will be independent with her advanced HEP.  Baseline:   Goal status: INITIAL  2.  Patient will be able to demonstrate at least 35 degrees of active cervical flexion without being limited by her familiar symptoms for improved function cleaning.  Baseline:   Goal status: INITIAL  3.  Patient will be able to clean her house without her familiar cervical pain exceeding 3/10.  Baseline:   Goal status: INITIAL  4.  Patient will be able to simulate washing her hair without being limited by her familiar symptoms for improved independence.  Baseline:   Goal status: INITIAL  PLAN: PT FREQUENCY: 2x/week  PT DURATION: 6 weeks  PLANNED INTERVENTIONS: Therapeutic exercises, Therapeutic activity, Neuromuscular re-education, Patient/Family education, Self Care, Joint mobilization, Dry Needling, Electrical stimulation, Spinal  mobilization, Cryotherapy, Moist heat, Manual therapy, and Re-evaluation  PLAN FOR NEXT SESSION: UBE, manual cervical traction, manual therapy, rows, chin tucks, and other appropriately matched interventions    Osha Errico,CHRIS, PTA 10/30/2022, 11:47 AM

## 2022-11-01 ENCOUNTER — Telehealth: Payer: Self-pay | Admitting: Cardiology

## 2022-11-01 NOTE — Telephone Encounter (Signed)
Will send to MD to verify if okay for appointment next week.  11/28

## 2022-11-01 NOTE — Telephone Encounter (Signed)
Patient called and wanted to know if she could do a virtual or telephone visit due to scheduling conflict.

## 2022-11-06 ENCOUNTER — Encounter: Payer: Medicare Other | Admitting: Family Medicine

## 2022-11-07 ENCOUNTER — Ambulatory Visit: Payer: Medicare Other | Admitting: Cardiology

## 2022-11-09 ENCOUNTER — Ambulatory Visit: Payer: Medicare Other

## 2022-11-09 DIAGNOSIS — M542 Cervicalgia: Secondary | ICD-10-CM | POA: Diagnosis not present

## 2022-11-09 DIAGNOSIS — R293 Abnormal posture: Secondary | ICD-10-CM | POA: Diagnosis not present

## 2022-11-09 NOTE — Therapy (Addendum)
OUTPATIENT PHYSICAL THERAPY CERVICAL TREATMENT   Patient Name: Mary [name] MRN: [id number] DOB:02-Feb-1986, 36 y.o., female Today's Date: 11/09/2022   PT End of Session - 11/09/22 0909     Visit Number 6    Number of Visits 12    Date for PT Re-Evaluation 12/08/22    PT Start Time 0906    PT Stop Time 0955    PT Time Calculation (min) 49 min            Past Medical History:  Diagnosis Date   Asthma    Bronchitis 01/31/2016   Chiari malformation type I ([place])    Depression    GERD (gastroesophageal reflux disease)    H/O multiple concussions    Headache(784.0)    History of palpitations    Hx of fracture of nose    Mental disorder    Multiple fractures    Hx: of a leg and arm fracture as a child   Pneumonia    PONV (postoperative nausea and vomiting)    Post traumatic stress disorder (PTSD)    Pregnant 01/21/2016   PTSD (post-traumatic stress disorder)    Sinus infection 01/31/2016   Supervision of normal pregnancy, antepartum 02/08/2016    [facility] at             13+1 week FOB   Ventura Sellers 36 yo WM Dating By  LMP and [place] Pap  02/08/16 GC/CT Initial:                36+wks: Genetic Screen NT/IT:  CF screen  Anatomic [place]  Flu vaccine  Tdap Recommended ~ 28wks Glucose Screen  2 hr GBS  Feed Preference  Contraception  Circumcision  Childbirth Classes  Pediatrician     Past Surgical History:  Procedure Laterality Date   botox injections     colposcopy     DILATION AND CURETTAGE OF UTERUS     nexplanon     SUBOCCIPITAL CRANIECTOMY CERVICAL LAMINECTOMY N/A 12/16/2018   Procedure: Cervical one Laminectomy/Chiari decompression;  Surgeon: Kary Kos, MD;  Location: [place];  Service: Neurosurgery;  Laterality: N/A;   WISDOM TOOTH EXTRACTION     Patient Active Problem List   Diagnosis Date Noted   Encounter to establish care 09/25/2022   Atypical chest pain 09/25/2022   Migraine with aura and without status migrainosus 07/24/2022   AMA (advanced  maternal age) multigravida 35+, first trimester 05/17/2022   Supervision of high risk pregnancy, antepartum 05/17/2022   History of posttraumatic stress disorder (PTSD) 04/25/2022   Vitamin D deficiency 09/11/2019   Hyperlipidemia 09/11/2019   Palpitations 07/30/2018   Chiari I malformation ([place]) 02/13/2018   Current smoker 04/20/2017   CIN I (cervical intraepithelial neoplasia I) 06/28/2015   ASCUS with positive high risk HPV 07/08/2013   Asthma 06/04/2013   Depression 06/04/2013   REFERRING PROVIDER: Pieter Partridge, DO  REFERRING DIAG: Cervicalgia; Migraine with aura and without status migrainosus, not intractable; Chiari I malformation; [redacted] weeks gestation of pregnancy  THERAPY DIAG:  Cervicalgia  Abnormal posture  Rationale for Evaluation and Treatment Rehabilitation  ONSET DATE: August 2023  SUBJECTIVE:  SUBJECTIVE STATEMENT: Pt reports 4-5/10 neck and shoulder pain today.  PERTINENT HISTORY:  History of TIA, asthma, Chiari malformation, depression, smoker, PTSD, and patient is currently pregnant  PAIN:  Are you having pain? Yes: NPRS scale: 4-5/10 Pain location: neck and back Pain description: stabbing and shooting (intermittent)  Aggravating factors: turning her head (right > left),looking down or up  Relieving factors: medication and heat  PRECAUTIONS: Other: pregnant (29 weeks; boy to be named Camera operator).  Chiari I malformation.  WEIGHT BEARING RESTRICTIONS No  PATIENT GOALS be able to wash dishes, clean her house, make her bed, wash her hair (she requires additional assistance to perform all of these activities from her family)   OBJECTIVE:   TODAY'S TREATMENT:  Modalities  Date: 11/30 Unattended Estim: Cervical, Pre-Mod, 20 mins, Pain and Tone Hot Pack: Cervical,  20 mins, Pain and Tone  Manual Therapy Soft Tissue Mobilization: B UT, Levator scap. And  cervical paraspinals, STW and TPR  to reduce pain and muscle tightness    ASSESSMENT:  CLINICAL IMPRESSION: Pt arrives for today's treatment session reporting 4-5/10 neck and shoulder pain.  Pt declined performance of exercises today and preferred manual therapy.  Pt with continued tone in bil cervical paraspinals and upper traps.  Normal responses to estim and MH note upon removal.  Pt reported 3/10 neck and shoulder pain at completion of today's treatment session.  PHYSICAL THERAPY DISCHARGE SUMMARY  Visits from Start of Care: 6  Current functional level related to goals / functional outcomes: Patient was unable to meet her goals for skilled physical therapy.   Remaining deficits: Pain and cervical AROM    Education / Equipment: HEP   Patient agrees to discharge. Patient goals were not met. Patient is being discharged due to not returning since the last visit.  Jacqulynn Cadet, PT, DPT    GOALS: Goals reviewed with patient? No  SHORT TERM GOALS: Target date: 10/09/2022   Patient will be independent with her initial HEP.  Baseline:   Goal status: IN PROGRESS  2.  Patient will be able to perform her daily activities without her familiar pain exceeding 5/10. Baseline:   Goal status: IN PROGRESS  3.  Patient will be able to demonstrate at least 60 degrees of bilateral cervical rotation without reproducing her familiar cervical pain.  Baseline:  Goal status: IN PROGRESS  LONG TERM GOALS: Target date: 10/30/2022  Patient will be independent with her advanced HEP.  Baseline:   Goal status: IN PROGRESS  2.  Patient will be able to demonstrate at least 35 degrees of active cervical flexion without being limited by her familiar symptoms for improved function cleaning.  Baseline:   Goal status: IN PROGRESS  3.  Patient will be able to clean her house without her familiar cervical pain  exceeding 3/10.  Baseline:   Goal status: IN PROGRESS  4.  Patient will be able to simulate washing her hair without being limited by her familiar symptoms for improved independence.  Baseline:   Goal status: IN PROGRESS  PLAN: PT FREQUENCY: 2x/week  PT DURATION: 6 weeks  PLANNED INTERVENTIONS: Therapeutic exercises, Therapeutic activity, Neuromuscular re-education, Patient/Family education, Self Care, Joint mobilization, Dry Needling, Electrical stimulation, Spinal mobilization, Cryotherapy, Moist heat, Manual therapy, and Re-evaluation  PLAN FOR NEXT SESSION: UBE, manual cervical traction, manual therapy, rows, chin tucks, and other appropriately matched interventions    Kathrynn Ducking, PTA 11/09/2022, 9:55 AM

## 2022-11-10 ENCOUNTER — Ambulatory Visit: Payer: Medicare Other | Attending: Cardiology | Admitting: Cardiology

## 2022-11-10 ENCOUNTER — Encounter: Payer: Self-pay | Admitting: Cardiology

## 2022-11-10 VITALS — BP 114/74 | HR 96 | Ht 64.0 in | Wt 169.0 lb

## 2022-11-10 DIAGNOSIS — Z79899 Other long term (current) drug therapy: Secondary | ICD-10-CM | POA: Diagnosis not present

## 2022-11-10 NOTE — Patient Instructions (Signed)
Medication Instructions:  Your physician recommends that you continue on your current medications as directed. Please refer to the Current Medication list given to you today.  *If you need a refill on your cardiac medications before your next appointment, please call your pharmacy*   Lab Work: Your physician recommends that you have the following lab drawn today CBC If you have labs (blood work) drawn today and your tests are completely normal, you will receive your results only by: MyChart Message (if you have MyChart) OR A paper copy in the mail If you have any lab test that is abnormal or we need to change your treatment, we will call you to review the results.   Testing/Procedures: NONE   Follow-Up: At [facility], you and your health needs are our priority.  As part of our continuing mission to provide you with exceptional heart care, we have created designated Provider Care Teams.  These Care Teams include your primary Cardiologist (physician) and Advanced Practice Providers (APPs -  Physician Assistants and Nurse Practitioners) who all work together to provide you with the care you need, when you need it.  We recommend signing up for the patient portal called "MyChart".  Sign up information is provided on this After Visit Summary.  MyChart is used to connect with patients for Virtual Visits (Telemedicine).  Patients are able to view lab/test results, encounter notes, upcoming appointments, etc.  Non-urgent messages can be sent to your provider as well.   To learn more about what you can do with MyChart, go to [email or web address].    Your next appointment:   12 week(s)  The format for your next appointment:   Virtual Visit   Provider:   Thomasene Ripple, DO

## 2022-11-11 LAB — CBC
Hematocrit: 32 % — ABNORMAL LOW (ref 34.0–46.6)
Hemoglobin: 10.5 g/dL — ABNORMAL LOW (ref 11.1–15.9)
MCH: 28.3 pg (ref 26.6–33.0)
MCHC: 32.8 g/dL (ref 31.5–35.7)
MCV: 86 fL (ref 79–97)
Platelets: 290 10*3/uL (ref 150–450)
RBC: 3.71 x10E6/uL — ABNORMAL LOW (ref 3.77–5.28)
RDW: 13.8 % (ref 11.7–15.4)
WBC: 8.3 10*3/uL (ref 3.4–10.8)

## 2022-11-13 ENCOUNTER — Other Ambulatory Visit (HOSPITAL_COMMUNITY)
Admission: RE | Admit: 2022-11-13 | Discharge: 2022-11-13 | Disposition: A | Discharge status: Home / Self Care | Payer: Medicare Other | Source: Ambulatory Visit | Attending: Obstetrics and Gynecology | Admitting: Obstetrics and Gynecology

## 2022-11-13 ENCOUNTER — Ambulatory Visit (INDEPENDENT_AMBULATORY_CARE_PROVIDER_SITE_OTHER): Payer: Medicare Other | Admitting: Obstetrics and Gynecology

## 2022-11-13 ENCOUNTER — Other Ambulatory Visit: Payer: Self-pay

## 2022-11-13 VITALS — BP 109/81 | HR 95 | Wt 169.0 lb

## 2022-11-13 DIAGNOSIS — Z3A37 37 weeks gestation of pregnancy: Secondary | ICD-10-CM | POA: Diagnosis not present

## 2022-11-13 DIAGNOSIS — O0993 Supervision of high risk pregnancy, unspecified, third trimester: Secondary | ICD-10-CM

## 2022-11-13 DIAGNOSIS — O0990 Supervision of high risk pregnancy, unspecified, unspecified trimester: Secondary | ICD-10-CM | POA: Diagnosis present

## 2022-11-13 DIAGNOSIS — D509 Iron deficiency anemia, unspecified: Secondary | ICD-10-CM

## 2022-11-13 DIAGNOSIS — D649 Anemia, unspecified: Secondary | ICD-10-CM | POA: Insufficient documentation

## 2022-11-13 DIAGNOSIS — Z23 Encounter for immunization: Secondary | ICD-10-CM

## 2022-11-13 DIAGNOSIS — R002 Palpitations: Secondary | ICD-10-CM

## 2022-11-13 DIAGNOSIS — E559 Vitamin D deficiency, unspecified: Secondary | ICD-10-CM

## 2022-11-13 MED ORDER — VITAMIN D (ERGOCALCIFEROL) 1.25 MG (50000 UNIT) PO CAPS: 50000.0000 [IU] | ORAL_CAPSULE | ORAL | 0 refills | Status: DC

## 2022-11-13 MED ORDER — FERROUS SULFATE 325 (65 FE) MG PO TABS: 325.0000 mg | ORAL_TABLET | ORAL | 3 refills | Status: DC

## 2022-11-13 NOTE — Progress Notes (Signed)
Cardio-Obstetrics Clinic  New Evaluation  Date:  11/13/2022   ID:  Mary [name], DOB Nov 16, 1986, MRN [id number]  PCP:  Philip Aspen, Limmie Patricia, MD   [facility] Providers Cardiologist:  Thomasene Ripple, DO  Electrophysiologist:  None       Referring MD: Philip Aspen, Estel*   Chief Complaint: " I am having chest pain"  History of Present Illness:    Mary [name] is a 36 y.o. female [[id number]] who is being seen today for the evaluation of palpitation and chest pain at the request of Philip Aspen, Estel*.   Medical history includes near syncope, Chiari malformation, PTSD the patient previously saw Dr. Duke Salvia back in 2019-2020.  Her last visit was a televisit in 2020 at which time to discuss her monitor results as well as her echo result.  She was seen on September 26, 2019.  At that time she reported experiencing significant palpitations.  I started the patient on propranolol 10 mg daily.  Since we started a low-dose of propanolol she has not taking this medication.  She tells me she still is experiencing palpitations.   Prior CV Studies Reviewed: The following studies were reviewed today:  TTE 08/29/2018 Study Conclusions  - Left ventricle: The cavity size was normal. Wall thickness was    normal. Systolic function was normal. The estimated ejection    fraction was in the range of 60% to 65%. Wall motion was normal;    there were no regional wall motion abnormalities. Left    ventricular diastolic function parameters were normal.  - Aortic valve: Valve area (VTI): 1.98 cm^2. Valve area (Vmax):    1.83 cm^2. Valve area (Vmean): 1.9 cm^2.  - Technically adequate study.   Zio monitor 08/29/2018 7 Day Zio Monitor Quality: Fair.  Baseline artifact. Predominant rhythm: sinus rhythm Average heart rate: 94 bpm Max heart rate: 143 bpm Rare PACs    Past Medical History:  Diagnosis Date   Asthma    Bronchitis 01/31/2016   Chiari malformation type I (HCC)     Depression    GERD (gastroesophageal reflux disease)    H/O multiple concussions    Headache(784.0)    History of palpitations    Hx of fracture of nose    Mental disorder    Multiple fractures    Hx: of a leg and arm fracture as a child   Pneumonia    PONV (postoperative nausea and vomiting)    Post traumatic stress disorder (PTSD)    Pregnant 01/21/2016   PTSD (post-traumatic stress disorder)    Sinus infection 01/31/2016   Supervision of normal pregnancy, antepartum 02/08/2016    [facility] at             13+1 week FOB   [name] 36 yo WM Dating By  LMP and [place] Pap  02/08/16 GC/CT Initial:                36+wks: Genetic Screen NT/IT:  CF screen  Anatomic [place]  Flu vaccine  Tdap Recommended ~ 28wks Glucose Screen  2 hr GBS  Feed Preference  Contraception  Circumcision  Childbirth Classes  Pediatrician      Past Surgical History:  Procedure Laterality Date   botox injections     colposcopy     DILATION AND CURETTAGE OF UTERUS     nexplanon     SUBOCCIPITAL CRANIECTOMY CERVICAL LAMINECTOMY N/A 12/16/2018   Procedure: Cervical one Laminectomy/Chiari decompression;  Surgeon: Donalee Citrin,  MD;  Location: [facility] OR;  Service: Neurosurgery;  Laterality: N/A;   WISDOM TOOTH EXTRACTION        OB History     Gravida  6   Para  4   Term  4   Preterm      AB  1   Living  4      SAB  1   IAB      Ectopic      Multiple  0   Live Births  4               Current Medications: Current Meds  Medication Sig   albuterol (VENTOLIN HFA) 108 (90 Base) MCG/ACT inhaler Inhale 2 puffs into the lungs every 6 (six) hours as needed for wheezing or shortness of breath.   ALPRAZolam (XANAX) 0.5 MG tablet Take by mouth.   aspirin-acetaminophen-caffeine (EXCEDRIN MIGRAINE) 250-250-65 MG tablet Take by mouth every 6 (six) hours as needed for headache.   cyclobenzaprine (FLEXERIL) 5 MG tablet Take 1 tablet (5 mg total) by mouth 3 (three) times daily as needed for  muscle spasms.   prenatal vitamin w/FE, FA (PRENATAL 1 + 1) 27-1 MG TABS tablet Take 1 tablet by mouth daily at 12 noon.     Allergies:   Codeine, Influenza a (h1n1) monovalent vaccine, Peanut-containing drug products, Sulfa antibiotics, and Robaxin [methocarbamol]   Social History   Socioeconomic History   Marital status: Significant Other    Spouse name: Not on file   Number of children: 4   Years of education: Not on file   Highest education level: GED or equivalent  Occupational History   Occupation: disabled    Comment: PTSD  Tobacco Use   Smoking status: Every Day    Packs/day: 0.50    Types: Cigarettes   Smokeless tobacco: Never   Tobacco comments:    NEVER USE SNUFF OR CHEWING TOBACCO  Vaping Use   Vaping Use: Never used  Substance and Sexual Activity   Alcohol use: Not Currently    Comment: social   Drug use: No   Sexual activity: Yes    Birth control/protection: None  Other Topics Concern   Not on file  Social History Narrative   Patient is right-handed. She is separated, has 4 sons. Is on disabilty for PTSD. She has one caffeine drink daily, walks 3 x week.home is one level   Social Determinants of Health   Financial Resource Strain: Low Risk  (03/31/2020)   Overall Financial Resource Strain (CARDIA)    Difficulty of Paying Living Expenses: Not hard at all  Food Insecurity: No Food Insecurity (08/21/2022)   Hunger Vital Sign    Worried About Running Out of Food in the Last Year: Never true    Ran Out of Food in the Last Year: Never true  Transportation Needs: No Transportation Needs (08/21/2022)   PRAPARE - Administrator, Civil Service (Medical): No    Lack of Transportation (Non-Medical): No  Physical Activity: Insufficiently Active (03/31/2020)   Exercise Vital Sign    Days of Exercise per Week: 2 days    Minutes of Exercise per Session: 30 min  Stress: No Stress Concern Present (03/31/2020)   [employer] of Occupational Health -  Occupational Stress Questionnaire    Feeling of Stress : Only a little  Social Connections: Moderately Isolated (03/31/2020)   Social Connection and Isolation Panel [NHANES]    Frequency of Communication with Friends and Family: More than  three times a week    Frequency of Social Gatherings with Friends and Family: Once a week    Attends Religious Services: 1 to 4 times per year    Active Member of [employer] or Organizations: No    Attends Engineer, structural: Never    Marital Status: Divorced      Family History  Problem Relation Age of Onset   Cancer Mother 69       breast   Hypertension Mother    Depression Mother    Depression Father    Seizures Father    Cancer Maternal Grandmother        breast and cervical   Asthma Son    Asthma Son    Drug abuse Paternal Grandmother    Alcohol abuse Paternal Grandmother    Hypertension Other    Cancer Other       ROS:   Please see the history of present illness.     All other systems reviewed and are negative.   Labs/EKG Reviewed:    EKG:   EKG is was  ordered today.  The ekg ordered today demonstrates sinus rhythm.  Recent Labs: 09/28/2022: ALT 11; BUN 8; Creatinine, Ser 0.58; Potassium 3.5; Sodium 136 10/19/2022: TSH 2.760 11/10/2022: Hemoglobin 10.5; Platelets 290   Recent Lipid Panel Lab Results  Component Value Date/Time   CHOL 186 07/07/2020 10:50 AM   TRIG 181 (H) 07/07/2020 10:50 AM   HDL 34 (L) 07/07/2020 10:50 AM   CHOLHDL 5.5 (H) 07/07/2020 10:50 AM   LDLCALC 122 (H) 07/07/2020 10:50 AM    Physical Exam:    VS:  BP 114/74   Pulse 96   Ht  (1.626 m)   Wt 169 lb (76.7 kg)   LMP 02/26/2022 (Within Weeks) Comment: Or 03/08/22  SpO2 99%   BMI 29.01 kg/m     Wt Readings from Last 3 Encounters:  11/10/22 169 lb (76.7 kg)  10/19/22 168 lb 9.6 oz (76.5 kg)  09/28/22 167 lb 12.8 oz (76.1 kg)     GEN:  Well nourished, well developed in no acute distress HEENT: Normal NECK: No JVD; No carotid  bruits LYMPHATICS: No lymphadenopathy CARDIAC: RRR, no murmurs, rubs, gallops RESPIRATORY:  Clear to auscultation without rales, wheezing or rhonchi  ABDOMEN: Soft, non-tender, non-distended MUSCULOSKELETAL:  No edema; No deformity  SKIN: Warm and dry NEUROLOGIC:  Alert and oriented x 3 PSYCHIATRIC:  Normal affect    Risk Assessment/Risk Calculators:                  ASSESSMENT & PLAN:    Palpitations  She still is experiencing palpitations but unfortunately she prefer not to take the propanolol.  We will continue to monitor the patient slowly during this pregnancy.  Postpartum if this continues we will discuss this again.  The patient is in agreement with the above plan. The patient left the office in stable condition.  The patient will follow up in  [facility]  New Evaluation  Date:  11/13/2022   ID:  Mary [name], DOB 20-Nov-1986, MRN [id number]  PCP:  Philip Aspen, Limmie Patricia, MD   [facility] Providers Cardiologist:  Thomasene Ripple, DO  Electrophysiologist:  None       Referring MD: Philip Aspen, Estel*   Chief Complaint: " I am having chest pain"  History of Present Illness:    Mary [name] is a 36 y.o. female [G6P4014] who is being seen today for the evaluation  of palpitation and chest pain at the request of Philip AspenHernandez Acosta, Estel*.   Medical history includes near syncope, Chiari malformation, PTSD the patient previously saw Dr. Duke Salviaandolph back in 2019-2020.  Her last visit was a televisit in 2020 at which time to discuss her monitor results as well as her echo result.  Since that time she notes that she still has been having worsening palpitations that is associated with chest discomfort.  Being pregnant has made this worse.  She is concerned.  She has not had any near syncope episodes.   Prior CV Studies Reviewed: The following studies were reviewed today:  TTE 08/29/2018 Study Conclusions  - Left ventricle: The cavity size  was normal. Wall thickness was    normal. Systolic function was normal. The estimated ejection    fraction was in the range of 60% to 65%. Wall motion was normal;    there were no regional wall motion abnormalities. Left    ventricular diastolic function parameters were normal.  - Aortic valve: Valve area (VTI): 1.98 cm^2. Valve area (Vmax):    1.83 cm^2. Valve area (Vmean): 1.9 cm^2.  - Technically adequate study.   Zio monitor 08/29/2018 7 Day Zio Monitor Quality: Fair.  Baseline artifact. Predominant rhythm: sinus rhythm Average heart rate: 94 bpm Max heart rate: 143 bpm Rare PACs    Past Medical History:  Diagnosis Date   Asthma    Bronchitis 01/31/2016   Chiari malformation type I (HCC)    Depression    GERD (gastroesophageal reflux disease)    H/O multiple concussions    Headache(784.0)    History of palpitations    Hx of fracture of nose    Mental disorder    Multiple fractures    Hx: of a leg and arm fracture as a child   Pneumonia    PONV (postoperative nausea and vomiting)    Post traumatic stress disorder (PTSD)    Pregnant 01/21/2016   PTSD (post-traumatic stress disorder)    Sinus infection 01/31/2016   Supervision of normal pregnancy, antepartum 02/08/2016    [facility] at             13+1 week FOB   Fabienne BrunsMichael Graffam 36 yo WM Dating By  LMP and US Pap  02/08/16 GC/CT Initial:                36+wks: Genetic Screen NT/IT:  CF screen  Anatomic US  Flu vaccine  Tdap Recommended ~ 28wks Glucose Screen  2 hr GBS  Feed Preference  Contraception  Circumcision  Childbirth Classes  Pediatrician      Past Surgical History:  Procedure Laterality Date   botox injections     colposcopy     DILATION AND CURETTAGE OF UTERUS     nexplanon     SUBOCCIPITAL CRANIECTOMY CERVICAL LAMINECTOMY N/A 12/16/2018   Procedure: Cervical one Laminectomy/Chiari decompression;  Surgeon: Donalee Citrinram, Gary, MD;  [facility];  Service: Neurosurgery;  Laterality: N/A;   WISDOM  TOOTH EXTRACTION        OB History     Gravida  6   Para  4   Term  4   Preterm      AB  1   Living  4      SAB  1   IAB      Ectopic      Multiple  0   Live Births  4  Current Medications: Current Meds  Medication Sig   albuterol (VENTOLIN HFA) 108 (90 Base) MCG/ACT inhaler Inhale 2 puffs into the lungs every 6 (six) hours as needed for wheezing or shortness of breath.   ALPRAZolam (XANAX) 0.5 MG tablet Take by mouth.   aspirin-acetaminophen-caffeine (EXCEDRIN MIGRAINE) 250-250-65 MG tablet Take by mouth every 6 (six) hours as needed for headache.   cyclobenzaprine (FLEXERIL) 5 MG tablet Take 1 tablet (5 mg total) by mouth 3 (three) times daily as needed for muscle spasms.   prenatal vitamin w/FE, FA (PRENATAL 1 + 1) 27-1 MG TABS tablet Take 1 tablet by mouth daily at 12 noon.     Allergies:   Codeine, Influenza a (h1n1) monovalent vaccine, Peanut-containing drug products, Sulfa antibiotics, and Robaxin [methocarbamol]   Social History   Socioeconomic History   Marital status: Significant Other    Spouse name: Not on file   Number of children: 4   Years of education: Not on file   Highest education level: GED or equivalent  Occupational History   Occupation: disabled    Comment: PTSD  Tobacco Use   Smoking status: Every Day    Packs/day: 0.50    Types: Cigarettes   Smokeless tobacco: Never   Tobacco comments:    NEVER USE SNUFF OR CHEWING TOBACCO  Vaping Use   Vaping Use: Never used  Substance and Sexual Activity   Alcohol use: Not Currently    Comment: social   Drug use: No   Sexual activity: Yes    Birth control/protection: None  Other Topics Concern   Not on file  Social History Narrative   Patient is right-handed. She is separated, has 4 sons. Is on disabilty for PTSD. She has one caffeine drink daily, walks 3 x week.home is one level   Social Determinants of Health   Financial Resource Strain: Low Risk  (03/31/2020)    Overall Financial Resource Strain (CARDIA)    Difficulty of Paying Living Expenses: Not hard at all  Food Insecurity: No Food Insecurity (08/21/2022)   Hunger Vital Sign    Worried About Running Out of Food in the Last Year: Never true    Ran Out of Food in the Last Year: Never true  Transportation Needs: No Transportation Needs (08/21/2022)   PRAPARE - Administrator, Civil Service (Medical): No    Lack of Transportation (Non-Medical): No  Physical Activity: Insufficiently Active (03/31/2020)   Exercise Vital Sign    Days of Exercise per Week: 2 days    Minutes of Exercise per Session: 30 min  Stress: No Stress Concern Present (03/31/2020)   [employer] of Occupational Health - Occupational Stress Questionnaire    Feeling of Stress : Only a little  Social Connections: Moderately Isolated (03/31/2020)   Social Connection and Isolation Panel [NHANES]    Frequency of Communication with Friends and Family: More than three times a week    Frequency of Social Gatherings with Friends and Family: Once a week    Attends Religious Services: 1 to 4 times per year    Active Member of [employer] or Organizations: No    Attends Banker Meetings: Never    Marital Status: Divorced      Family History  Problem Relation Age of Onset   Cancer Mother 73       breast   Hypertension Mother    Depression Mother    Depression Father    Seizures Father    Cancer Maternal  Grandmother        breast and cervical   Asthma Son    Asthma Son    Drug abuse Paternal Grandmother    Alcohol abuse Paternal Grandmother    Hypertension Other    Cancer Other       ROS:   Please see the history of present illness.     All other systems reviewed and are negative.   Labs/EKG Reviewed:    EKG:   EKG is was  ordered today.  The ekg ordered today demonstrates sinus rhythm.  Recent Labs: 09/28/2022: ALT 11; BUN 8; Creatinine, Ser 0.58; Potassium 3.5; Sodium 136 10/19/2022: TSH  2.760 11/10/2022: Hemoglobin 10.5; Platelets 290   Recent Lipid Panel Lab Results  Component Value Date/Time   CHOL 186 07/07/2020 10:50 AM   TRIG 181 (H) 07/07/2020 10:50 AM   HDL 34 (L) 07/07/2020 10:50 AM   CHOLHDL 5.5 (H) 07/07/2020 10:50 AM   LDLCALC 122 (H) 07/07/2020 10:50 AM    Physical Exam:    VS:  BP 114/74   Pulse 96   Ht 5\' 4"  (1.626 m)   Wt 169 lb (76.7 kg)   LMP 02/26/2022 (Within Weeks) Comment: Or 03/08/22  SpO2 99%   BMI 29.01 kg/m     Wt Readings from Last 3 Encounters:  11/10/22 169 lb (76.7 kg)  10/19/22 168 lb 9.6 oz (76.5 kg)  09/28/22 167 lb 12.8 oz (76.1 kg)     GEN:  Well nourished, well developed in no acute distress HEENT: Normal NECK: No JVD; No carotid bruits LYMPHATICS: No lymphadenopathy CARDIAC: RRR, no murmurs, rubs, gallops RESPIRATORY:  Clear to auscultation without rales, wheezing or rhonchi  ABDOMEN: Soft, non-tender, non-distended MUSCULOSKELETAL:  No edema; No deformity  SKIN: Warm and dry NEUROLOGIC:  Alert and oriented x 3 PSYCHIATRIC:  Normal affect    Risk Assessment/Risk Calculators:                  ASSESSMENT & PLAN:    Atypical chest pain Palpitations  Her chest pain is atypical.  But is associated with her palpitations.  We will do a trial of low-dose propranolol 10 mg daily.  Hopefully this will help.  Postpartum we will need to explore any further evaluation for the coronaries especially the anatomy if she continues to have chest discomfort.  Patient Instructions  Medication Instructions:  Your physician recommends that you continue on your current medications as directed. Please refer to the Current Medication list given to you today.  *If you need a refill on your cardiac medications before your next appointment, please call your pharmacy*   Lab Work: Your physician recommends that you have the following lab drawn today CBC If you have labs (blood work) drawn today and your tests are completely  normal, you will receive your results only by: MyChart Message (if you have MyChart) OR A paper copy in the mail If you have any lab test that is abnormal or we need to change your treatment, we will call you to review the results.   Testing/Procedures: NONE   Follow-Up: At [facility], you and your health needs are our priority.  As part of our continuing mission to provide you with exceptional heart care, we have created designated Provider Care Teams.  These Care Teams include your primary Cardiologist (physician) and Advanced Practice Providers (APPs -  Physician Assistants and Nurse Practitioners) who all work together to provide you with the care you need, when you  need it.  We recommend signing up for the patient portal called "MyChart".  Sign up information is provided on this After Visit Summary.  MyChart is used to connect with patients for Virtual Visits (Telemedicine).  Patients are able to view lab/test results, encounter notes, upcoming appointments, etc.  Non-urgent messages can be sent to your provider as well.   To learn more about what you can do with MyChart, go to [email or web address].    Your next appointment:   12 week(s)  The format for your next appointment:   Virtual Visit   Provider:   Thomasene Ripple, DO     Dispo:  Return in about 12 weeks (around 02/02/2023).   Medication Adjustments/Labs and Tests Ordered: Current medicines are reviewed at length with the patient today.  Concerns regarding medicines are outlined above.  Tests Ordered: Orders Placed This Encounter  Procedures   CBC   Medication Changes: No orders of the defined types were placed in this encounter.   Patient Instructions  Medication Instructions:  Your physician recommends that you continue on your current medications as directed. Please refer to the Current Medication list given to you today.  *If you need a refill on your cardiac medications before your next appointment,  please call your pharmacy*   Lab Work: Your physician recommends that you have the following lab drawn today CBC If you have labs (blood work) drawn today and your tests are completely normal, you will receive your results only by: MyChart Message (if you have MyChart) OR A paper copy in the mail If you have any lab test that is abnormal or we need to change your treatment, we will call you to review the results.   Testing/Procedures: NONE   Follow-Up: At [facility], you and your health needs are our priority.  As part of our continuing mission to provide you with exceptional heart care, we have created designated Provider Care Teams.  These Care Teams include your primary Cardiologist (physician) and Advanced Practice Providers (APPs -  Physician Assistants and Nurse Practitioners) who all work together to provide you with the care you need, when you need it.  We recommend signing up for the patient portal called "MyChart".  Sign up information is provided on this After Visit Summary.  MyChart is used to connect with patients for Virtual Visits (Telemedicine).  Patients are able to view lab/test results, encounter notes, upcoming appointments, etc.  Non-urgent messages can be sent to your provider as well.   To learn more about what you can do with MyChart, go to [email or web address].    Your next appointment:   12 week(s)  The format for your next appointment:   Virtual Visit   Provider:   Thomasene Ripple, DO     Dispo:  Return in about 12 weeks (around 02/02/2023).   Medication Adjustments/Labs and Tests Ordered: Current medicines are reviewed at length with the patient today.  Concerns regarding medicines are outlined above.  Tests Ordered: Orders Placed This Encounter  Procedures   CBC   Medication Changes: No orders of the defined types were placed in this encounter.

## 2022-11-13 NOTE — Progress Notes (Signed)
   PRENATAL VISIT NOTE  Subjective:  Mary [name] is a 36 y.o. [phone] at [redacted]w[redacted]d being seen today for ongoing prenatal care.  She is currently monitored for the following issues for this high-risk pregnancy and has Asthma; Depression; Current smoker; Chiari I malformation (HCC); Palpitations; ASCUS with positive high risk HPV; Vitamin D deficiency; Hyperlipidemia; History of posttraumatic stress disorder (PTSD); AMA (advanced maternal age) multigravida 35+, first trimester; Supervision of high risk pregnancy, antepartum; Migraine with aura and without status migrainosus; Atypical chest pain; and Anemia on their problem list.  Patient doing well. Continues to  Contractions: Irritability. Vag. Bleeding: None.  Movement: Present. Denies leaking of fluid.   The following portions of the patient's history were reviewed and updated as appropriate: allergies, current medications, past family history, past medical history, past social history, past surgical history and problem list.   Objective:   Vitals:   11/13/22 1126  BP: 109/81  Pulse: 95  Weight: 169 lb (76.7 kg)    Fetal Status: Fetal Heart Rate (bpm): 142   Movement: Present    SCE 1/l/hi  General:  Alert, oriented and cooperative. Patient is in no acute distress.  Skin: Skin is warm and dry. No rash noted.   Cardiovascular: Normal heart rate noted  Respiratory: Normal respiratory effort, no problems with respiration noted  Abdomen: Soft, gravid, appropriate for gestational age.  Pain/Pressure: Absent      Assessment and Plan:  Pregnancy: [id number] at [redacted]w[redacted]d 1. Supervision of high risk pregnancy, antepartum Tdap today - Culture, beta strep (group b only) - GC/Chlamydia probe amp ([facility])not at [facility]  2. Palpitations, improving Discussed cardiology appointment  3. Vitamin D deficiency -     Vitamin D, Ergocalciferol, (DRISDOL) 1.25 MG (50000 UNIT) CAPS capsule; Take 1 capsule (50,000 Units total) by mouth every 7 (seven) days  x 5 doses  4. Iron deficiency anemia -     ferrous sulfate (FERROUSUL) 325 (65 FE) MG tablet; Take 1 tablet (325 mg total) by mouth every other day.  Please refer to After Visit Summary for other counseling recommendations.   RTC in 1 week  Future Appointments  Date Time Provider Department Center  11/20/2022 11:15 AM Lennart Pall, MD [facility]  11/27/2022 11:15 AM Mora Appl [facility]  12/07/2022 10:15 AM [facility]  12/07/2022 11:15 AM Alfredia Ferguson, MD [facility]  12/18/2022 10:10 AM Drema Dallas, DO [facility] None  01/31/2023 10:00 AM Tobb, Lavona Mound, DO [facility] None   Lennart Pall, MD

## 2022-11-14 ENCOUNTER — Encounter: Payer: Self-pay | Admitting: *Deleted

## 2022-11-14 LAB — GC/CHLAMYDIA PROBE AMP (~~LOC~~) NOT AT ARMC
Chlamydia: NEGATIVE
Comment: NEGATIVE
Comment: NORMAL
Neisseria Gonorrhea: NEGATIVE

## 2022-11-17 LAB — CULTURE, BETA STREP (GROUP B ONLY): Strep Gp B Culture: NEGATIVE

## 2022-11-20 ENCOUNTER — Ambulatory Visit (INDEPENDENT_AMBULATORY_CARE_PROVIDER_SITE_OTHER): Payer: Medicare Other | Admitting: Obstetrics and Gynecology

## 2022-11-20 ENCOUNTER — Other Ambulatory Visit: Payer: Self-pay

## 2022-11-20 VITALS — BP 117/86 | HR 99 | Wt 170.0 lb

## 2022-11-20 DIAGNOSIS — R002 Palpitations: Secondary | ICD-10-CM

## 2022-11-20 DIAGNOSIS — D509 Iron deficiency anemia, unspecified: Secondary | ICD-10-CM

## 2022-11-20 DIAGNOSIS — O0990 Supervision of high risk pregnancy, unspecified, unspecified trimester: Secondary | ICD-10-CM

## 2022-11-20 DIAGNOSIS — Z3A38 38 weeks gestation of pregnancy: Secondary | ICD-10-CM

## 2022-11-20 DIAGNOSIS — E559 Vitamin D deficiency, unspecified: Secondary | ICD-10-CM

## 2022-11-20 NOTE — Progress Notes (Addendum)
   PRENATAL VISIT NOTE  Subjective:  Mary [name] is a 36 y.o. [phone] at [redacted]w[redacted]d being seen today for ongoing prenatal care.  She is currently monitored for the following issues for this high-risk pregnancy and has Asthma; Depression; Current smoker; Chiari I malformation (HCC); Palpitations; ASCUS with positive high risk HPV; Vitamin D deficiency; Hyperlipidemia; History of posttraumatic stress disorder (PTSD); AMA (advanced maternal age) multigravida 35+, first trimester; Supervision of high risk pregnancy, antepartum; Migraine with aura and without status migrainosus; Atypical chest pain; and Anemia on their problem list.  Patient reports  abdominal pressure/discomfort .  Contractions: Irritability. Vag. Bleeding: None.  Movement: Present. Denies leaking of fluid.   Desires eIOL due to distance from hospital, quick labor in previous pregnancies.  The following portions of the patient's history were reviewed and updated as appropriate: allergies, current medications, past family history, past medical history, past social history, past surgical history and problem list.   Objective:   Vitals:   11/20/22 1125  BP: 117/86  Pulse: 99  Weight: 170 lb (77.1 kg)   Fetal Status: Fetal Heart Rate (bpm): 148   Movement: Present     General:  Alert, oriented and cooperative. Patient is in no acute distress.  Skin: Skin is warm and dry. No rash noted.   Cardiovascular: Normal heart rate noted  Respiratory: Normal respiratory effort, no problems with respiration noted  Abdomen: Soft, gravid, appropriate for gestational age.  Pain/Pressure: Present      Assessment and Plan:  Pregnancy: [id number] at [redacted]w[redacted]d 1. [redacted] weeks gestation of pregnancy GBS negative Desires eIOL - after discussion, would like SCE at next appt and to be scheduled for eIOL if favorable at that time  2. Palpitations Improved, feeling better w/ iron & vit D supplementation  3. Iron deficiency anemia, unspecified iron deficiency  anemia type Continue PO iron  4. Vitamin D deficiency Continue weekly vitamin D  Term labor symptoms and general obstetric precautions including but not limited to vaginal bleeding, contractions, leaking of fluid and fetal movement were reviewed in detail with the patient. Please refer to After Visit Summary for other counseling recommendations.   Return in 1 week (on 11/27/2022).  Future Appointments  Date Time Provider Department Center  11/27/2022 11:15 AM Mora Appl [facility]  12/07/2022 10:15 AM [facility]  12/07/2022 11:15 AM Alfredia Ferguson, MD [facility]  12/18/2022 10:10 AM Drema Dallas, DO [facility] None  01/31/2023 10:00 AM Tobb, Lavona Mound, DO [facility] None    Lennart Pall, MD

## 2022-11-27 ENCOUNTER — Encounter: Payer: Self-pay | Admitting: Advanced Practice Midwife

## 2022-11-27 ENCOUNTER — Inpatient Hospital Stay (HOSPITAL_COMMUNITY): Payer: Medicare Other | Admitting: Anesthesiology

## 2022-11-27 ENCOUNTER — Inpatient Hospital Stay (HOSPITAL_COMMUNITY)
Admission: AD | Admit: 2022-11-27 | Discharge: 2022-11-29 | DRG: 807 | Disposition: A | Discharge status: Home / Self Care | Payer: Medicare Other | Attending: Obstetrics & Gynecology | Admitting: Obstetrics & Gynecology

## 2022-11-27 ENCOUNTER — Encounter: Payer: Self-pay | Admitting: Obstetrics and Gynecology

## 2022-11-27 ENCOUNTER — Encounter (HOSPITAL_COMMUNITY): Payer: Self-pay | Admitting: Obstetrics & Gynecology

## 2022-11-27 DIAGNOSIS — F32A Depression, unspecified: Secondary | ICD-10-CM | POA: Diagnosis not present

## 2022-11-27 DIAGNOSIS — F17210 Nicotine dependence, cigarettes, uncomplicated: Secondary | ICD-10-CM | POA: Diagnosis present

## 2022-11-27 DIAGNOSIS — O99284 Endocrine, nutritional and metabolic diseases complicating childbirth: Principal | ICD-10-CM | POA: Diagnosis present

## 2022-11-27 DIAGNOSIS — O99344 Other mental disorders complicating childbirth: Secondary | ICD-10-CM | POA: Diagnosis present

## 2022-11-27 DIAGNOSIS — O9902 Anemia complicating childbirth: Secondary | ICD-10-CM | POA: Diagnosis present

## 2022-11-27 DIAGNOSIS — O99334 Smoking (tobacco) complicating childbirth: Secondary | ICD-10-CM | POA: Diagnosis present

## 2022-11-27 DIAGNOSIS — E785 Hyperlipidemia, unspecified: Secondary | ICD-10-CM | POA: Diagnosis present

## 2022-11-27 DIAGNOSIS — J45909 Unspecified asthma, uncomplicated: Secondary | ICD-10-CM | POA: Diagnosis not present

## 2022-11-27 DIAGNOSIS — Z3A39 39 weeks gestation of pregnancy: Secondary | ICD-10-CM

## 2022-11-27 DIAGNOSIS — O26893 Other specified pregnancy related conditions, third trimester: Secondary | ICD-10-CM | POA: Diagnosis present

## 2022-11-27 DIAGNOSIS — F419 Anxiety disorder, unspecified: Secondary | ICD-10-CM | POA: Diagnosis present

## 2022-11-27 DIAGNOSIS — D649 Anemia, unspecified: Secondary | ICD-10-CM | POA: Diagnosis present

## 2022-11-27 DIAGNOSIS — O4202 Full-term premature rupture of membranes, onset of labor within 24 hours of rupture: Secondary | ICD-10-CM

## 2022-11-27 LAB — TYPE AND SCREEN
ABO/RH(D): A POS
Antibody Screen: NEGATIVE

## 2022-11-27 LAB — CBC
HCT: 33.8 % — ABNORMAL LOW (ref 36.0–46.0)
Hemoglobin: 10.8 g/dL — ABNORMAL LOW (ref 12.0–15.0)
MCH: 28.1 pg (ref 26.0–34.0)
MCHC: 32 g/dL (ref 30.0–36.0)
MCV: 88 fL (ref 80.0–100.0)
Platelets: 278 10*3/uL (ref 150–400)
RBC: 3.84 MIL/uL — ABNORMAL LOW (ref 3.87–5.11)
RDW: 15.7 % — ABNORMAL HIGH (ref 11.5–15.5)
WBC: 9.6 10*3/uL (ref 4.0–10.5)
nRBC: 0 % (ref 0.0–0.2)

## 2022-11-27 LAB — POCT FERN TEST

## 2022-11-27 LAB — RPR: RPR Ser Ql: NONREACTIVE

## 2022-11-27 MED ORDER — PRENATAL MULTIVITAMIN CH
1.0000 | ORAL_TABLET | Freq: Every day | ORAL | Status: DC
  Administered 2022-11-28 – 2022-11-29 (×2): 1 via ORAL
  Filled 2022-11-27 (×2): qty 1

## 2022-11-27 MED ORDER — COCONUT OIL OIL: 1.0000 | TOPICAL_OIL | Status: DC | PRN

## 2022-11-27 MED ORDER — EPHEDRINE 5 MG/ML INJ: 10.0000 mg | INTRAVENOUS | Status: DC | PRN

## 2022-11-27 MED ORDER — EPHEDRINE 5 MG/ML INJ
10.0000 mg | INTRAVENOUS | Status: DC | PRN
  Filled 2022-11-27: qty 5

## 2022-11-27 MED ORDER — PHENYLEPHRINE 80 MCG/ML (10ML) SYRINGE FOR IV PUSH (FOR BLOOD PRESSURE SUPPORT): 80.0000 ug | PREFILLED_SYRINGE | INTRAVENOUS | Status: DC | PRN

## 2022-11-27 MED ORDER — DIPHENHYDRAMINE HCL 50 MG/ML IJ SOLN: 12.5000 mg | INTRAMUSCULAR | Status: DC | PRN

## 2022-11-27 MED ORDER — WITCH HAZEL-GLYCERIN EX PADS: 1.0000 | MEDICATED_PAD | CUTANEOUS | Status: DC | PRN

## 2022-11-27 MED ORDER — PHENYLEPHRINE 80 MCG/ML (10ML) SYRINGE FOR IV PUSH (FOR BLOOD PRESSURE SUPPORT)
80.0000 ug | PREFILLED_SYRINGE | INTRAVENOUS | Status: DC | PRN
  Filled 2022-11-27: qty 10

## 2022-11-27 MED ORDER — SIMETHICONE 80 MG PO CHEW: 80.0000 mg | CHEWABLE_TABLET | ORAL | Status: DC | PRN

## 2022-11-27 MED ORDER — OXYCODONE-ACETAMINOPHEN 5-325 MG PO TABS: 1.0000 | ORAL_TABLET | ORAL | Status: DC | PRN

## 2022-11-27 MED ORDER — ONDANSETRON HCL 4 MG PO TABS: 4.0000 mg | ORAL_TABLET | ORAL | Status: DC | PRN

## 2022-11-27 MED ORDER — IBUPROFEN 600 MG PO TABS
600.0000 mg | ORAL_TABLET | Freq: Four times a day (QID) | ORAL | Status: DC
  Administered 2022-11-27 – 2022-11-29 (×7): 600 mg via ORAL
  Filled 2022-11-27 (×8): qty 1

## 2022-11-27 MED ORDER — ONDANSETRON HCL 4 MG/2ML IJ SOLN: 4.0000 mg | Freq: Four times a day (QID) | INTRAMUSCULAR | Status: DC | PRN

## 2022-11-27 MED ORDER — OXYTOCIN-SODIUM CHLORIDE 30-0.9 UT/500ML-% IV SOLN
1.0000 m[IU]/min | INTRAVENOUS | Status: DC
  Administered 2022-11-27: 2 m[IU]/min via INTRAVENOUS

## 2022-11-27 MED ORDER — ACETAMINOPHEN 325 MG PO TABS
650.0000 mg | ORAL_TABLET | ORAL | Status: DC | PRN
  Administered 2022-11-27: 650 mg via ORAL
  Filled 2022-11-27: qty 2

## 2022-11-27 MED ORDER — ACETAMINOPHEN 325 MG PO TABS
650.0000 mg | ORAL_TABLET | ORAL | Status: DC | PRN
  Administered 2022-11-28 – 2022-11-29 (×5): 650 mg via ORAL
  Filled 2022-11-27 (×5): qty 2

## 2022-11-27 MED ORDER — OXYCODONE HCL 5 MG PO TABS
5.0000 mg | ORAL_TABLET | ORAL | Status: DC | PRN
  Administered 2022-11-28 – 2022-11-29 (×5): 5 mg via ORAL
  Filled 2022-11-27 (×5): qty 1

## 2022-11-27 MED ORDER — DIBUCAINE (PERIANAL) 1 % EX OINT: 1.0000 | TOPICAL_OINTMENT | CUTANEOUS | Status: DC | PRN

## 2022-11-27 MED ORDER — ZOLPIDEM TARTRATE 5 MG PO TABS: 5.0000 mg | ORAL_TABLET | Freq: Every evening | ORAL | Status: DC | PRN

## 2022-11-27 MED ORDER — OXYTOCIN-SODIUM CHLORIDE 30-0.9 UT/500ML-% IV SOLN
2.5000 [IU]/h | INTRAVENOUS | Status: DC
  Administered 2022-11-27: 2.5 [IU]/h via INTRAVENOUS
  Filled 2022-11-27: qty 500

## 2022-11-27 MED ORDER — OXYCODONE HCL 5 MG PO TABS
10.0000 mg | ORAL_TABLET | ORAL | Status: DC | PRN
  Administered 2022-11-28: 10 mg via ORAL
  Filled 2022-11-27: qty 2

## 2022-11-27 MED ORDER — LACTATED RINGERS IV SOLN: INTRAVENOUS | Status: DC

## 2022-11-27 MED ORDER — SENNOSIDES-DOCUSATE SODIUM 8.6-50 MG PO TABS
2.0000 | ORAL_TABLET | ORAL | Status: DC
  Administered 2022-11-27 – 2022-11-28 (×2): 2 via ORAL
  Filled 2022-11-27 (×2): qty 2

## 2022-11-27 MED ORDER — LIDOCAINE HCL (PF) 1 % IJ SOLN
INTRAMUSCULAR | Status: DC | PRN
  Administered 2022-11-27: 4 mL via EPIDURAL
  Administered 2022-11-27: 6 mL via EPIDURAL

## 2022-11-27 MED ORDER — TERBUTALINE SULFATE 1 MG/ML IJ SOLN: 0.2500 mg | Freq: Once | INTRAMUSCULAR | Status: DC | PRN

## 2022-11-27 MED ORDER — OXYCODONE-ACETAMINOPHEN 5-325 MG PO TABS: 2.0000 | ORAL_TABLET | ORAL | Status: DC | PRN

## 2022-11-27 MED ORDER — LACTATED RINGERS IV SOLN
500.0000 mL | Freq: Once | INTRAVENOUS | Status: AC
  Administered 2022-11-27: 500 mL via INTRAVENOUS

## 2022-11-27 MED ORDER — SOD CITRATE-CITRIC ACID 500-334 MG/5ML PO SOLN: 30.0000 mL | ORAL | Status: DC | PRN

## 2022-11-27 MED ORDER — TETANUS-DIPHTH-ACELL PERTUSSIS 5-2.5-18.5 LF-MCG/0.5 IM SUSY: 0.5000 mL | PREFILLED_SYRINGE | Freq: Once | INTRAMUSCULAR | Status: DC

## 2022-11-27 MED ORDER — ONDANSETRON HCL 4 MG/2ML IJ SOLN: 4.0000 mg | INTRAMUSCULAR | Status: DC | PRN

## 2022-11-27 MED ORDER — OXYTOCIN BOLUS FROM INFUSION
333.0000 mL | Freq: Once | INTRAVENOUS | Status: AC
  Administered 2022-11-27: 333 mL via INTRAVENOUS

## 2022-11-27 MED ORDER — FENTANYL-BUPIVACAINE-NACL 0.5-0.125-0.9 MG/250ML-% EP SOLN
12.0000 mL/h | EPIDURAL | Status: DC | PRN
  Administered 2022-11-27: 12 mL/h via EPIDURAL
  Filled 2022-11-27: qty 250

## 2022-11-27 MED ORDER — BENZOCAINE-MENTHOL 20-0.5 % EX AERO
1.0000 | INHALATION_SPRAY | CUTANEOUS | Status: DC | PRN
  Administered 2022-11-29: 1 via TOPICAL
  Filled 2022-11-27 (×2): qty 56

## 2022-11-27 MED ORDER — LACTATED RINGERS IV SOLN
500.0000 mL | INTRAVENOUS | Status: DC | PRN
  Administered 2022-11-27: 500 mL via INTRAVENOUS

## 2022-11-27 MED ORDER — LIDOCAINE HCL (PF) 1 % IJ SOLN: 30.0000 mL | INTRAMUSCULAR | Status: DC | PRN

## 2022-11-27 MED ORDER — DIPHENHYDRAMINE HCL 25 MG PO CAPS: 25.0000 mg | ORAL_CAPSULE | Freq: Four times a day (QID) | ORAL | Status: DC | PRN

## 2022-11-27 NOTE — Progress Notes (Signed)
Pt informed that the ultrasound is considered a limited OB ultrasound and is not intended to be a complete ultrasound exam.  Patient also informed that the ultrasound is not being completed with the intent of assessing for fetal or placental anomalies or any pelvic abnormalities.  Explained that the purpose of today's ultrasound is to assess for  presentation.  Patient acknowledges the purpose of the exam and the limitations of the study.    Vertex presentation confirmed.   Rolm Bookbinder, CNM 11/27/22 8:00 AM

## 2022-11-27 NOTE — Anesthesia Procedure Notes (Signed)
Epidural Patient location during procedure: OB Start time: 11/27/2022 10:11 AM End time: 11/27/2022 10:25 AM  Staffing Anesthesiologist: Lucretia Kern, MD Performed: anesthesiologist   Preanesthetic Checklist Completed: patient identified, IV checked, risks and benefits discussed, monitors and equipment checked, pre-op evaluation and timeout performed  Epidural Patient position: sitting Prep: DuraPrep Patient monitoring: heart rate, continuous pulse ox and blood pressure Approach: midline Location: L3-L4 Injection technique: LOR air  Needle:  Needle type: Tuohy  Needle gauge: 17 G Needle length: 9 cm Needle insertion depth: 7 cm Catheter type: closed end flexible Catheter size: 19 Gauge Catheter at skin depth: 11 cm Test dose: negative  Assessment Events: blood not aspirated, no cerebrospinal fluid, injection not painful, no injection resistance, no paresthesia and negative IV test  Additional Notes Reason for block:procedure for pain

## 2022-11-27 NOTE — Progress Notes (Signed)
Mary [name] is a 36 y.o. [phone] at [redacted]w[redacted]d  admitted for active labor, rupture of membranes  Subjective:  Comfortable with epidural. No questions at this time.   Objective: BP 112/80   Pulse 75   Temp 97.9 F (36.6 C) (Oral)   Resp 18   Ht 5\' 4"  (1.626 m)   Wt 79.2 kg   LMP 02/26/2022 (Within Weeks) Comment: Or 03/08/22  SpO2 97%   BMI 29.97 kg/m  No intake/output data recorded. No intake/output data recorded.  FHT:  FHR: 130 bpm, variability: moderate,  accelerations:  Present,  decelerations:  Absent UC:   not tracing well, toco adjusted, approx every 2-5 mins  SVE:   Dilation: 6 Effacement (%): 80 Station: -1 Exam by:: [email or web address] CNM AROM- clear fluid   Labs: Lab Results  Component Value Date   WBC 9.6 11/27/2022   HGB 10.8 (L) 11/27/2022   HCT 33.8 (L) 11/27/2022   MCV 88.0 11/27/2022   PLT 278 11/27/2022    Assessment / Plan: Spontaneous labor, progressing normally  Labor:  AROM now  Preeclampsia:   NA Fetal Wellbeing:  Category I Pain Control:  Epidural I/D:  n/a Anticipated MOD:  NSVD   11/29/2022 DNP, CNM  11/27/22  12:34 PM

## 2022-11-27 NOTE — MAU Note (Signed)
.  Mary [name] is a 36 y.o. at [redacted]w[redacted]d here in MAU reporting: SROM at 0500 clear fluid. Irreg. Contractions. +bloody show.  Denies vaginal or rectal pressure. Talking through contractions-states there are every 3 min. + fetal movement.   Onset of complaint: 0500 Pain score: 6 There were no vitals filed for this visit.   FHT:133bpm Lab orders placed from triage:  mau labor

## 2022-11-27 NOTE — Lactation Note (Signed)
This note was copied from a baby's chart. Lactation Consultation Note  Patient Name: Mary [name] BTYOM'A Date: 11/27/2022   Age:36 hours Mom is formula feeding. Maternal Data    Feeding    LATCH Score                    Lactation Tools Discussed/Used    Interventions    Discharge    Consult Status Consult Status: Complete    Kaye Luoma G 11/27/2022, 7:28 PM

## 2022-11-27 NOTE — Progress Notes (Signed)
Mary [name] is a 36 y.o. [id number] at [redacted]w[redacted]d admitted for active labor, rupture of membranes  Subjective: Comfortable with epidural. Feeling some pressure. RN called me to the room to assess variables and consider FSE.   Objective: BP 111/65   Pulse 80   Temp 97.8 F (36.6 C) (Axillary)   Resp 18   Ht 5\' 4"  (1.626 m)   Wt 79.2 kg   LMP 02/26/2022 (Within Weeks) Comment: Or 03/08/22  SpO2 97%   BMI 29.97 kg/m  No intake/output data recorded. No intake/output data recorded.  FHT:  FHR: 125 bpm, variability: moderate,  accelerations:  Present,  decelerations:  Present variables and possible earlies UC:   regular, every 2-3 minutes SVE:   Dilation: 9 Effacement (%): 100 Station: 0 Exam by:: [email or web address] CNM FSE placed   Labs: Lab Results  Component Value Date   WBC 9.6 11/27/2022   HGB 10.8 (L) 11/27/2022   HCT 33.8 (L) 11/27/2022   MCV 88.0 11/27/2022   PLT 278 11/27/2022    Assessment / Plan: Augmentation of labor, progressing well  Labor: Progressing normally Preeclampsia:   NA Fetal Wellbeing:  Category I and Category II Pain Control:  Epidural I/D:  n/a Anticipated MOD:  NSVD  11/29/2022 DNP, CNM  11/27/22  4:26 PM

## 2022-11-27 NOTE — H&P (Cosign Needed Addendum)
Olisa Quesnel is a 36 y.o. female presenting for spontaneous rupture of membranes and contractions. Patient had a gush of clear fluid at 0500 and contraction started at that time. She reports normal fetal movement.   OB History     Gravida  6   Para  4   Term  4   Preterm      AB  1   Living  4      SAB  1   IAB      Ectopic      Multiple  0   Live Births  4          Past Medical History:  Diagnosis Date   Asthma    Bronchitis 01/31/2016   Chiari malformation type I (HCC)    Depression    GERD (gastroesophageal reflux disease)    H/O multiple concussions    Headache(784.0)    History of palpitations    Hx of fracture of nose    Mental disorder    Multiple fractures    Hx: of a leg and arm fracture as a child   Pneumonia    PONV (postoperative nausea and vomiting)    Post traumatic stress disorder (PTSD)    Pregnant 01/21/2016   PTSD (post-traumatic stress disorder)    Sinus infection 01/31/2016   Supervision of normal pregnancy, antepartum 02/08/2016    [facility] at             13+1 week FOB   [name] 36 yo WM Dating By  LMP and [place] Pap  02/08/16 GC/CT Initial:                36+wks: Genetic Screen NT/IT:  CF screen  Anatomic [place]  Flu vaccine  Tdap Recommended ~ 28wks Glucose Screen  2 hr GBS  Feed Preference  Contraception  Circumcision  Childbirth Classes  Pediatrician     Past Surgical History:  Procedure Laterality Date   botox injections     colposcopy     DILATION AND CURETTAGE OF UTERUS     nexplanon     SUBOCCIPITAL CRANIECTOMY CERVICAL LAMINECTOMY N/A 12/16/2018   Procedure: Cervical one Laminectomy/Chiari decompression;  Surgeon: Donalee Citrin, MD;  Location: [facility] OR;  Service: Neurosurgery;  Laterality: N/A;   WISDOM TOOTH EXTRACTION     Family History: family history includes Alcohol abuse in her paternal grandmother; Asthma in her son and son; Cancer in her maternal grandmother and another family member; Cancer (age of  onset: 43) in her mother; Depression in her father and mother; Drug abuse in her paternal grandmother; Hypertension in her mother and another family member; Seizures in her father. Social History:  reports that she has been smoking cigarettes. She has been smoking an average of .5 packs per day. She has never used smokeless tobacco. She reports that she does not currently use alcohol. She reports that she does not use drugs.  Nursing Staff Provider  Office Location [facility] Dating  LMP  [facility] Model [x]  Traditional [ ]  Centering [ ]  Mom-Baby Dyad      Language  English Anatomy  WNL  Flu Vaccine  ALLERGY Genetic/Carrier Screen  NIPS:low risk female    AFP:   neg Horizon: Neg x 4  TDaP Vaccine  11/13/22 Hgb A1C or  GTT Early  Third trimester - normal  COVID Vaccine No   LAB RESULTS   Rhogam  A+ Blood Type A/Positive/-- (07/13 1642)   Baby  Feeding Plan Bottle Antibody Negative (07/13 1642)  Contraception Tubal Rubella 2.83 (07/13 1642)  Circumcision Yes RPR Non Reactive (11/09 0928)   Pediatrician  [facility] HBsAg Negative (07/13 1642)   Support Person Jacob(FOB) HCVAb Negative  Prenatal Classes Info given HIV Non Reactive (11/09 1610)     BTL Consent 11/13/22 GBS Negative/-- (12/04 1306) (For PCN allergy, check sensitivities)   VBAC Consent NA Pap 03/2020 - NILM/HPV neg           DME Rx Arly.Keller ] BP cuff [ ]  Weight Scale Waterbirth  [ ]  Class [ ]  Consent [ ]  CNM visit  PHQ9 & GAD7 [x]  new OB - 12/13 [ ]  28 weeks  [  ] 36 weeks Induction  [ ]  Orders Entered [ ] Foley Y/N     Maternal Diabetes: No Genetic Screening: Normal Maternal Ultrasounds/Referrals: Normal Fetal Ultrasounds or other Referrals:  None Maternal Substance Abuse:  No Significant Maternal Medications:  None Significant Maternal Lab Results:  Group B Strep negative Number of Prenatal Visits:greater than 3 verified prenatal visits Other Comments:  None  Review of Systems  All other systems reviewed and are  negative.  Maternal Medical History:  Reason for admission: Rupture of membranes and contractions.   Contractions: Onset was 3-5 hours ago.   Frequency: regular.   Perceived severity is moderate.   Fetal activity: Perceived fetal activity is normal.   Last perceived fetal movement was greater than 24 hours ago.   Prenatal Complications - Diabetes: none.   Dilation: 3 Effacement (%): Thick Station: -2 Exam by:: , RN Blood pressure 109/77, pulse 95, temperature 97.9 F (36.6 C), temperature source Oral, resp. rate 18, height 5\' 4"  (1.626 m), weight 79.2 kg, last menstrual period 02/26/2022, SpO2 100 %.  Maternal Exam:  Uterine Assessment: Contraction strength is mild.  Abdomen: Patient reports no abdominal tenderness. Fetal presentation: vertex Introitus: Normal vulva. Normal vagina.  Vaginal discharge: mucusy.  Pelvis: adequate for delivery.     Fetal Exam Fetal Monitor Review: Mode: ultrasound.   Baseline rate: 130.  Variability: moderate (6-25 bpm).   Pattern: accelerations present and no decelerations.   Fetal State Assessment: Category I - tracings are normal.   Physical Exam Constitutional:      Appearance: She is well-developed.  HENT:     Head: Normocephalic.  Eyes:     Pupils: Pupils are equal, round, and reactive to light.  Cardiovascular:     Rate and Rhythm: Normal rate and regular rhythm.     Heart sounds: Normal heart sounds.  Pulmonary:     Effort: Pulmonary effort is normal. No respiratory distress.     Breath sounds: Normal breath sounds.  Abdominal:     Palpations: Abdomen is soft.     Tenderness: There is no abdominal tenderness.  Genitourinary:    General: Normal vulva.     Vagina: No bleeding. Vaginal discharge: mucusy.    Comments: External: no lesion Vagina: small amount of white discharge Dilation: 3 Effacement (%): Thick Cervical Position: Posterior Station: -2 Presentation: Undeterminable Exam by:: 1/14,  RN   Musculoskeletal:        General: Normal range of motion.     Cervical back: Normal range of motion and neck supple.  Skin:    General: Skin is warm and dry.  Neurological:     Mental Status: She is alert and oriented to person, place, and time.  Psychiatric:        Mood and Affect: Mood  normal.        Behavior: Behavior normal.     Prenatal labs: ABO, Rh: --/--/PENDING (12/18 3244) Antibody: PENDING (12/18 0748) Rubella: 2.83 (07/13 1642) RPR: Non Reactive (11/09 0928)  HBsAg: Negative (07/13 1642)  HIV: Non Reactive (11/09 0928)  GBS: Negative/-- (12/04 1306)   Assessment/Plan: Genesee Nase is a 36 y.o. [id number] at [redacted]w[redacted]d   - Reassuring maternal/fetal status  - SROM - Spontaneous onset of labor  - GBS Negative  - Admit to labor and delivery  - Epidural PRN  - Anticipate NSVD       Thressa Sheller DNP, CNM  11/27/22  9:02 AM

## 2022-11-27 NOTE — Discharge Summary (Shared)
Postpartum Discharge Summary  Date of Service updated***     Patient Name: Mary [name] DOB: 1986/08/05 MRN: [id number]  Date of admission: 11/27/2022 Delivery date:11/27/2022  Delivering provider: Marcille Buffy D  Date of discharge: 11/27/2022  Admitting diagnosis: Indication for care in labor or delivery [O75.9] Intrauterine pregnancy: [redacted]w[redacted]d    Secondary diagnosis:  Principal Problem:   Indication for care in labor or delivery  Additional problems: ***    Discharge diagnosis: Term Pregnancy Delivered                                              Post partum procedures:{Postpartum procedures:23558} Augmentation: AROM and Pitocin Complications: None  Hospital course: Onset of Labor With Vaginal Delivery      36y.o. yo [id number] admitted in Active Labor on 11/27/2022. Labor course was complicated by none  Membrane Rupture Time/Date: 5:00 AM ,11/27/2022   Delivery Method:  NSVD Episiotomy:   None  Lacerations:    None  Patient had a postpartum course complicated by ***.  She is ambulating, tolerating a regular diet, passing flatus, and urinating well. Patient is discharged home in stable condition on 11/27/22.  Newborn Data: Birth date:11/27/2022  Birth time:4:52 PM  Gender:Female  Living status:Living  Apgars:8 ,9  Weight:   Magnesium Sulfate received: No BMZ received: No Rhophylac:N/A MMR:N/A T-DaP:{Tdap:23962} Flu: N/A Transfusion:{Transfusion received:30440034}  Physical exam  Vitals:   11/27/22 1430 11/27/22 1501 11/27/22 1702 11/27/22 1717  BP: 109/75 111/65 120/85 110/69  Pulse: 80 80  81  Resp:      Temp:      TempSrc:      SpO2:      Weight:      Height:       General: {Exam; general:21111117} Lochia: {Desc; appropriate/inappropriate:30686::"appropriate"} Uterine Fundus: {Desc; firm/soft:30687} Incision: {Exam; incision:21111123} DVT Evaluation: {Exam; dvt:2111122} Labs: Lab Results  Component Value Date   WBC 9.6 11/27/2022    HGB 10.8 (L) 11/27/2022   HCT 33.8 (L) 11/27/2022   MCV 88.0 11/27/2022   PLT 278 11/27/2022      Latest Ref Rng & Units 09/28/2022   10:26 AM  CMP  Glucose 70 - 99 mg/dL 88   BUN 6 - 20 mg/dL 8   Creatinine 0.44 - 1.00 mg/dL 0.58   Sodium 135 - 145 mmol/L 136   Potassium 3.5 - 5.1 mmol/L 3.5   Chloride 98 - 111 mmol/L 109   CO2 22 - 32 mmol/L 21   Calcium 8.9 - 10.3 mg/dL 8.5   Total Protein 6.5 - 8.1 g/dL 6.3   Total Bilirubin 0.3 - 1.2 mg/dL 0.3   Alkaline Phos 38 - 126 U/L 74   AST 15 - 41 U/L 13   ALT 0 - 44 U/L 11    Edinburgh Score:     No data to display           After visit meds:  Allergies as of 11/27/2022       Reactions   Codeine Shortness Of Breath, Other (See Comments)   Can not take in liquid form but does not recall exactly what formulation she had Has taken vicodin and percocet without problem per pt   Influenza A (h1n1) Monovalent Vaccine Anaphylaxis   Sulfa Antibiotics Anaphylaxis, Itching   Eggs Or Egg-derived Products    Peanut-containing Drug  Products    Robaxin [methocarbamol] Other (See Comments)   Migraine     Med Rec must be completed prior to using this [place]***        Discharge home in stable condition Infant Feeding: {Baby feeding:23562} Infant Disposition:{CHL IP OB HOME WITH DUPBDH:78978} Discharge instruction: per After Visit Summary and Postpartum booklet. Activity: Advance as tolerated. Pelvic rest for 6 weeks.  Diet: {OB ERQS:12820813} Future Appointments: Future Appointments  Date Time Provider [place]  12/18/2022 10:10 AM Pieter Partridge, DO [facility] None  01/31/2023 10:00 AM Tobb, Godfrey Pick, DO [facility] None   Follow up Visit:   Please schedule this patient for a In person postpartum visit in  2 weeks  with the following provider: MD. Additional Postpartum F/U: Needs pp visit with MD to scheduled interval BTL    Low risk pregnancy complicated by:  none  Delivery mode:    Anticipated Birth  Control:  Plans Interval BTL

## 2022-11-27 NOTE — Anesthesia Preprocedure Evaluation (Signed)
Anesthesia Evaluation  Patient identified by MRN, date of birth, ID band Patient awake    Reviewed: Allergy & Precautions, H&P , NPO status , Patient's Chart, lab work & pertinent test results  History of Anesthesia Complications Negative for: history of anesthetic complications  Airway Mallampati: II  TM Distance: >3 FB     Dental   Pulmonary asthma , Current Smoker and Patient abstained from smoking.   Pulmonary exam normal        Cardiovascular negative cardio ROS  Rhythm:regular Rate:Normal     Neuro/Psych   Anxiety Depression    Chiari malformation type I s/p decompression  negative psych ROS   GI/Hepatic Neg liver ROS,GERD  ,,  Endo/Other  negative endocrine ROS    Renal/GU negative Renal ROS  negative genitourinary   Musculoskeletal   Abdominal   Peds  Hematology negative hematology ROS (+)   Anesthesia Other Findings   Reproductive/Obstetrics (+) Pregnancy                              Anesthesia Physical Anesthesia Plan  ASA: 2  Anesthesia Plan: Epidural   Post-op Pain Management:    Induction:   PONV Risk Score and Plan:   Airway Management Planned:   Additional Equipment:   Intra-op Plan:   Post-operative Plan:   Informed Consent: I have reviewed the patients History and Physical, chart, labs and discussed the procedure including the risks, benefits and alternatives for the proposed anesthesia with the patient or authorized representative who has indicated his/her understanding and acceptance.       Plan Discussed with:   Anesthesia Plan Comments:          Anesthesia Quick Evaluation

## 2022-11-28 NOTE — Progress Notes (Signed)
Post Partum Day 1 Subjective: no complaints, up ad lib, voiding, and tolerating PO Disappointed that PP tubal could not be completed since papers werent valid  Objective: Blood pressure 103/72, pulse 93, temperature 97.9 F (36.6 C), temperature source Oral, resp. rate 17, height 5\' 4"  (1.626 m), weight 79.2 kg, last menstrual period 02/26/2022, SpO2 98 %, unknown if currently breastfeeding.  Physical Exam:  General: alert, oriented, NAD Lochia: appropriate Uterine Fundus: firm DVT Evaluation: No evidence of DVT seen on physical exam.  Recent Labs    11/27/22 0752  HGB 10.8*  HCT 33.8*    Assessment/Plan: Postpartum - Contraception: considering interval tubal vs vasectomy with Depo for bridge, will finalize plan tomorrow AM - MOF: formula - Rh status: positive - Rubella status: immune - Dispo: anticipate discharge home PPD2  Neonatal - Doing well - Circumcision: desired   LOS: 1 day   11/29/22 11/28/2022, 11:37 AM

## 2022-11-28 NOTE — Anesthesia Postprocedure Evaluation (Signed)
Anesthesia Post Note  Patient: Mary [name]  Procedure(s) Performed: AN AD HOC LABOR EPIDURAL     Patient location during evaluation: Mother Baby Anesthesia Type: Epidural Level of consciousness: awake and alert and oriented Pain management: satisfactory to patient Vital Signs Assessment: post-procedure vital signs reviewed and stable Respiratory status: respiratory function stable Cardiovascular status: stable Postop Assessment: no headache, no backache, epidural receding, patient able to bend at knees, no signs of nausea or vomiting, adequate PO intake and able to ambulate Anesthetic complications: no   No notable events documented.  Last Vitals:  Vitals:   11/28/22 0402 11/28/22 0850  BP: 117/73 103/72  Pulse: 95 93  Resp: 18 17  Temp: 36.6 C 36.6 C  SpO2: 98%     Last Pain:  Vitals:   11/28/22 0915  TempSrc:   PainSc: 7    Pain Goal: Patients Stated Pain Goal: 0 (11/27/22 1345)                 [name]
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
## 2022-11-29 ENCOUNTER — Other Ambulatory Visit (HOSPITAL_COMMUNITY): Payer: Self-pay

## 2022-11-29 MED ORDER — IBUPROFEN 600 MG PO TABS
600.0000 mg | ORAL_TABLET | Freq: Four times a day (QID) | ORAL | 0 refills | Status: DC
  Filled 2022-11-29: qty 30, 8d supply, fill #0

## 2022-11-29 MED ORDER — SENNOSIDES-DOCUSATE SODIUM 8.6-50 MG PO TABS
2.0000 | ORAL_TABLET | ORAL | 0 refills | Status: DC
  Filled 2022-11-29: qty 30, 15d supply, fill #0

## 2022-11-29 MED ORDER — ACETAMINOPHEN 325 MG PO TABS
650.0000 mg | ORAL_TABLET | ORAL | 0 refills | Status: DC | PRN
  Filled 2022-11-29: qty 30, 3d supply, fill #0

## 2022-11-29 MED ORDER — BENZOCAINE-MENTHOL 20-0.5 % EX AERO
1.0000 | INHALATION_SPRAY | CUTANEOUS | 0 refills | Status: DC | PRN
  Filled 2022-11-29: qty 78, fill #0

## 2022-11-29 MED ORDER — ALPRAZOLAM 0.25 MG PO TABS: 0.2500 mg | ORAL_TABLET | Freq: Three times a day (TID) | ORAL | Status: DC | PRN

## 2022-11-30 ENCOUNTER — Telehealth: Payer: Self-pay

## 2022-11-30 NOTE — Patient Outreach (Signed)
  Care Coordination TOC Note Transition Care Management Unsuccessful Follow-up Telephone Call  Date of discharge and from where:  11/29/22-[place]  Attempts:  1st Attempt  Reason for unsuccessful TCM follow-up call:  Left voice message     Jamia Hoban, RN,BSN,CCM [facility] Care Management Telephonic Care Management Coordinator Direct Phone: [phone] Toll Free: [phone] Fax: [phone]   

## 2022-12-01 ENCOUNTER — Telehealth: Payer: Self-pay

## 2022-12-01 NOTE — Patient Outreach (Signed)
  Care Coordination TOC Note Transition Care Management Unsuccessful Follow-up Telephone Call  Date of discharge and from where:  11/29/22-   Attempts:  3rd Attempt  Reason for unsuccessful TCM follow-up call:  Unable to reach patient     Alessandra Grout [facility] Care Management Telephonic Care Management Coordinator Direct Phone: [phone] Toll Free: [phone] Fax: [phone]

## 2022-12-01 NOTE — Patient Outreach (Signed)
  Care Coordination TOC Note Transition Care Management Follow-up Telephone Call Date of discharge and from where: 11/29/22-[place] "vaginal delivery" How have you been since you were released from the hospital? Incoming call from patient returning RN CM call. She voices that her and the baby are doing well. She took him to see pediatrician on yesterday. Patient reports some mild pain-taking Tylenol and Ibuprofen. Any questions or concerns? No  Items Reviewed: Did the pt receive and understand the discharge instructions provided? Yes  Medications obtained and verified? Yes  Other? Yes  Any new allergies since your discharge? No  Dietary orders reviewed? Yes Do you have support at home? Yes   Home Care and Equipment/Supplies: Were home health services ordered? not applicable If so, what is the name of the agency? N/A  Has the agency set up a time to come to the patient's home? not applicable Were any new equipment or medical supplies ordered?  No What is the name of the medical supply agency? N/A Were you able to get the supplies/equipment? not applicable Do you have any questions related to the use of the equipment or supplies? No  Functional Questionnaire: (I = Independent and D = Dependent) ADLs: I  Bathing/Dressing- I  Meal Prep- I  Eating- I  Maintaining continence- I  Transferring/Ambulation- I  Managing Meds- I  Follow up appointments reviewed:  PCP Hospital f/u appt confirmed? No  . Specialist Hospital f/u appt confirmed?  Patient will call OB/GYN office today to make an appt  . Are transportation arrangements needed? No  If their condition worsens, is the pt aware to call PCP or go to the Emergency Dept.? Yes Was the patient provided with contact information for the PCP's office or ED? Yes Was to pt encouraged to call back with questions or concerns? Yes  SDOH assessments and interventions completed:   Yes   Care Coordination Interventions:  Education  provided    Encounter Outcome:  Pt. Visit Completed     Antionette Fairy, RN,BSN,CCM [facility] Care Management Telephonic Care Management Coordinator Direct Phone: [phone] Toll Free: [phone] Fax: [phone]

## 2022-12-01 NOTE — Patient Outreach (Signed)
  Care Coordination TOC Note Transition Care Management Unsuccessful Follow-up Telephone Call  Date of discharge and from where:  11/29/22-[place]   Attempts:  2nd Attempt  Reason for unsuccessful TCM follow-up call:  No answer/busy      Antionette Fairy, RN,BSN,CCM [facility] Care Management Telephonic Care Management Coordinator Direct Phone: [phone] Toll Free: [phone] Fax: [phone]

## 2022-12-03 ENCOUNTER — Inpatient Hospital Stay (HOSPITAL_COMMUNITY)
Admission: AD | Admit: 2022-12-03 | Payer: Medicare Other | Source: Home / Self Care | Admitting: Obstetrics and Gynecology

## 2022-12-07 ENCOUNTER — Encounter: Payer: Self-pay | Admitting: Cardiology

## 2022-12-07 ENCOUNTER — Other Ambulatory Visit: Payer: Self-pay

## 2022-12-07 ENCOUNTER — Telehealth (HOSPITAL_COMMUNITY): Payer: Self-pay

## 2022-12-07 ENCOUNTER — Encounter: Payer: Self-pay | Admitting: Family Medicine

## 2022-12-07 NOTE — Telephone Encounter (Signed)
Patient did not answer phone call. Voicemail left for patient.   Marcelino [facility] 12/07/22,1337
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
## 2022-12-12 ENCOUNTER — Encounter: Payer: Self-pay | Admitting: Obstetrics and Gynecology

## 2022-12-18 ENCOUNTER — Encounter: Payer: Self-pay | Admitting: Neurology

## 2022-12-18 ENCOUNTER — Ambulatory Visit: Payer: Medicare Other | Admitting: Neurology

## 2022-12-28 ENCOUNTER — Ambulatory Visit: Payer: Medicare Other | Admitting: Student

## 2023-01-02 ENCOUNTER — Encounter: Payer: Self-pay | Admitting: Neurology

## 2023-01-02 ENCOUNTER — Other Ambulatory Visit: Payer: Self-pay

## 2023-01-02 DIAGNOSIS — M542 Cervicalgia: Secondary | ICD-10-CM

## 2023-01-02 NOTE — Progress Notes (Signed)
Per Dr.Jaffe, Ok to send another referral to PT for neck pain.

## 2023-01-04 ENCOUNTER — Other Ambulatory Visit (HOSPITAL_COMMUNITY)
Admission: RE | Admit: 2023-01-04 | Discharge: 2023-01-04 | Disposition: A | Discharge status: Home / Self Care | Payer: 59 | Source: Ambulatory Visit | Attending: Student | Admitting: Student

## 2023-01-04 ENCOUNTER — Ambulatory Visit (INDEPENDENT_AMBULATORY_CARE_PROVIDER_SITE_OTHER): Payer: 59 | Admitting: Certified Nurse Midwife

## 2023-01-04 ENCOUNTER — Encounter: Payer: Self-pay | Admitting: Certified Nurse Midwife

## 2023-01-04 DIAGNOSIS — Z124 Encounter for screening for malignant neoplasm of cervix: Secondary | ICD-10-CM | POA: Diagnosis not present

## 2023-01-04 DIAGNOSIS — Z30430 Encounter for insertion of intrauterine contraceptive device: Secondary | ICD-10-CM

## 2023-01-04 DIAGNOSIS — Z1151 Encounter for screening for human papillomavirus (HPV): Secondary | ICD-10-CM | POA: Diagnosis not present

## 2023-01-04 DIAGNOSIS — Z3202 Encounter for pregnancy test, result negative: Secondary | ICD-10-CM

## 2023-01-04 DIAGNOSIS — Z01419 Encounter for gynecological examination (general) (routine) without abnormal findings: Secondary | ICD-10-CM | POA: Insufficient documentation

## 2023-01-04 MED ORDER — LEVONORGESTREL 20.1 MCG/DAY IU IUD
1.0000 | INTRAUTERINE_SYSTEM | Freq: Once | INTRAUTERINE | Status: AC
  Administered 2023-01-04: 1 via INTRAUTERINE

## 2023-01-04 MED ORDER — IBUPROFEN 800 MG PO TABS
800.0000 mg | ORAL_TABLET | Freq: Once | ORAL | Status: AC
  Administered 2023-01-04: 800 mg via ORAL

## 2023-01-04 NOTE — Progress Notes (Signed)
Postpartum Visit Note  Mary [name] is a 37 y.o. [phone] female who presents for a postpartum visit. She is 5 weeks 3 days postpartum following a normal spontaneous vaginal delivery.  I have fully reviewed the prenatal and intrapartum course. The delivery was at [redacted]w[redacted]d.  Anesthesia: epidural. Postpartum course has been uncomplicated. Baby is doing well. Baby is feeding by formula. Bleeding no bleeding. Bowel function is normal. Bladder function is normal. Patient is not sexually active. Contraception method is abstinence. Was planning a tubal but consent was not signed in time so she has decided on an IUD and would like it placed today. Postpartum depression screening: positive.   Upstream - 01/04/23 1540       Pregnancy Intention Screening   Does the patient want to become pregnant in the next year? No    Does the patient's partner want to become pregnant in the next year? No    Would the patient like to discuss contraceptive options today? Yes      Contraception Wrap Up   Current Method Abstinence    End Method IUD or IUS    Contraception Counseling Provided Yes    How was the end contraceptive method provided? Provided on site            The pregnancy intention screening data noted above was reviewed. Potential methods of contraception were discussed. The patient elected to proceed with IUD or IUS.   Edinburgh Postnatal Depression Scale - 01/04/23 1211       Edinburgh Postnatal Depression Scale:  In the Past 7 Days   I have been able to laugh and see the funny side of things. 0    I have looked forward with enjoyment to things. 0    I have blamed myself unnecessarily when things went wrong. 3    I have been anxious or worried for no good reason. 2    I have felt scared or panicky for no good reason. 3    Things have been getting on top of me. 2    I have been so unhappy that I have had difficulty sleeping. 0    I have felt sad or miserable. 1    I have been so unhappy  that I have been crying. 0    The thought of harming myself has occurred to me. 0    Edinburgh Postnatal Depression Scale Total 11            Health Maintenance Due  Topic Date Due   Medicare Annual Wellness (AWV)  Never done   COVID-19 Vaccine (1) Never done   The following portions of the patient's history were reviewed and updated as appropriate: allergies, current medications, past family history, past medical history, past social history, past surgical history, and problem list.  Review of Systems Pertinent items noted in HPI and remainder of comprehensive ROS otherwise negative.  Objective:  BP 118/82   Pulse 73   Wt 159 lb 11.2 oz (72.4 kg)   LMP 02/26/2022   Breastfeeding No   BMI 27.41 kg/m    Constitutional: Alert, oriented female in no physical distress.  HEENT: PERRLA Skin: normal color and turgor, no rash Cardiovascular: normal rate & rhythm Respiratory: normal effort, no problems with respiration noted GI: Abd soft, non-tender MS: Extremities nontender, no edema, normal ROM Neurologic: Alert and oriented x 4.  Pelvic: perineum healing well, no sutures visible. No blood noted. Pap/swabs collected  IUD Insertion Procedure Note Patient identified,  informed consent performed, consent signed.   Discussed risks of irregular bleeding, cramping, infection, malpositioning or misplacement of the IUD outside the uterus which may require further procedure such as laparoscopy. Also discussed >99% contraception efficacy, increased risk of ectopic pregnancy with failure of method.   Emphasized that this did not protect against STIs, condoms recommended during all sexual encounters. Time out was performed.  Urine pregnancy test negative.  Speculum placed in the vagina.  Cervix visualized.  Cleaned with Betadine x 2.  Cervix easily visualized and did not require tenaculum. IUD placed per manufacturer's recommendations.  Strings trimmed to 3 cm.   Patient tolerated procedure  well.   Patient was given post-procedure instructions.  She was advised to have backup contraception for one week.  Patient was also asked to check IUD strings periodically and follow up in 4 weeks for IUD check.  Assessment:  Postpartum care and examination Encounter for Pap testing IUD insertion  Plan:   Essential components of care per ACOG recommendations:  1.  Mood and well being: Patient with positive (mild - 11) depression screening today. Reviewed local resources for support.  - Patient tobacco use? No.   - hx of drug use? No.    2. Infant care and feeding:  -Patient currently breastmilk feeding? No.  -Social determinants of health (SDOH) reviewed in EPIC. No concerns  3. Sexuality, contraception and birth spacing - Patient does not want a pregnancy in the next year.  Desired family size is 5 children.  - Reviewed reproductive life planning. Reviewed contraceptive methods based on pt preferences and effectiveness.  Patient desired IUD or IUS today.   - Discussed birth spacing of 18 months  4. Sleep and fatigue -Encouraged family/partner/community support of 4 hrs of uninterrupted sleep to help with mood and fatigue  5. Physical Recovery  - Discussed patients delivery and complications. She describes her labor as good. - Patient had a Vaginal, no problems at delivery. Patient had  no  laceration. Perineal healing reviewed. Patient expressed understanding - Patient has urinary incontinence? No. - Patient is safe to resume physical and sexual activity  6.  Health Maintenance - HM due items addressed Yes - Last pap smear  Diagnosis  Date Value Ref Range Status  03/31/2020   Final   - Negative for intraepithelial lesion or malignancy (NILM)   Pap smear done at today's visit.  -Breast Cancer screening indicated? No.   7. Chronic Disease/Pregnancy Condition follow up: None - PCP follow up  Gabriel Carina, [place] [facility] [place]

## 2023-01-05 LAB — POCT PREGNANCY, URINE: Preg Test, Ur: NEGATIVE

## 2023-01-09 ENCOUNTER — Ambulatory Visit: Payer: 59 | Attending: Neurology

## 2023-01-09 DIAGNOSIS — M6281 Muscle weakness (generalized): Secondary | ICD-10-CM | POA: Insufficient documentation

## 2023-01-09 DIAGNOSIS — M546 Pain in thoracic spine: Secondary | ICD-10-CM | POA: Insufficient documentation

## 2023-01-09 DIAGNOSIS — M542 Cervicalgia: Secondary | ICD-10-CM | POA: Diagnosis not present

## 2023-01-09 LAB — CYTOLOGY - PAP
Comment: NEGATIVE
Diagnosis: NEGATIVE
High risk HPV: NEGATIVE

## 2023-01-09 NOTE — Therapy (Signed)
OUTPATIENT PHYSICAL THERAPY CERVICAL EVALUATION   Patient Name: Mary [name] MRN: [id number] DOB:03-02-1986, 37 y.o., female Today's Date: 01/09/2023  END OF SESSION:  PT End of Session - 01/09/23 1123     Visit Number 1    Number of Visits 10    Date for PT Re-Evaluation 03/09/23    PT Start Time 1125    PT Stop Time 1207    PT Time Calculation (min) 42 min    Activity Tolerance Patient tolerated treatment well    Behavior During Therapy WFL for tasks assessed/performed             Past Medical History:  Diagnosis Date   Asthma    Atypical squamous cell changes of undetermined significance (ASCUS) on cervical cytology with positive high risk human papilloma virus (HPV) 07/08/2013   Overview:   ASCUS with positive high risk HPV 07/08/13  Mild to moderate dysplasia on colpo 02/10/14   Plan for repeat colpo in 6 months (Due 08/13/14) schedulers flagged.  ASCUS with positive high risk HPV 07/08/13  Mild to moderate dysplasia on colpo 02/10/14   Plan for repeat colpo in 6 months (Due 08/13/14) schedulers flagged.   Bronchitis 01/31/2016   Chiari malformation type I ([place])    Depression    GERD (gastroesophageal reflux disease)    H/O multiple concussions    Headache(784.0)    History of palpitations    Hx of fracture of nose    Mental disorder    Multiple fractures    Hx: of a leg and arm fracture as a child   Pneumonia    PONV (postoperative nausea and vomiting)    Post traumatic stress disorder (PTSD)    Pregnant 01/21/2016   PTSD (post-traumatic stress disorder)    Sinus infection 01/31/2016   Supervision of normal pregnancy, antepartum 02/08/2016    [facility] at             13+1 week FOB   Ventura Sellers 37 yo WM Dating By  LMP and [place] Pap  02/08/16 GC/CT Initial:                36+wks: Genetic Screen NT/IT:  CF screen  Anatomic [place]  Flu vaccine  Tdap Recommended ~ 28wks Glucose Screen  2 hr GBS  Feed Preference  Contraception  Circumcision  Childbirth  Classes  Pediatrician     Past Surgical History:  Procedure Laterality Date   botox injections     colposcopy     DILATION AND CURETTAGE OF UTERUS     nexplanon     SUBOCCIPITAL CRANIECTOMY CERVICAL LAMINECTOMY N/A 12/16/2018   Procedure: Cervical one Laminectomy/Chiari decompression;  Surgeon: Kary Kos, MD;  Location: [facility];  Service: Neurosurgery;  Laterality: N/A;   WISDOM TOOTH EXTRACTION     Patient Active Problem List   Diagnosis Date Noted   Atypical chest pain 09/25/2022   Migraine with aura and without status migrainosus 07/24/2022   History of posttraumatic stress disorder (PTSD) 04/25/2022   Vitamin D deficiency 09/11/2019   Hyperlipidemia 09/11/2019   Palpitations 07/30/2018   Chiari I malformation ([place]) 02/13/2018   Current smoker 04/20/2017   Asthma 06/04/2013   Depression 06/04/2013   REFERRING PROVIDER: Pieter Partridge, DO   REFERRING DIAG: Cervicalgia   THERAPY DIAG:  Cervicalgia  Pain in thoracic spine  Muscle weakness (generalized)  Rationale for Evaluation and Treatment: Rehabilitation  ONSET DATE: August 2023   SUBJECTIVE:  SUBJECTIVE STATEMENT: Patient reports that her neck is hurting more than it was when she was at therapy at the end of last year. She noticed that it had been helping, but she had to stop therapy as she had her son. She also notes that she has gotten more dizzy since she had her son. She notes that she had some previously, but it was not this bad. She feels that sometimes she will pass out and is afraid to drive. She also has been getting headaches when it rains. She has noticed that her right has started going numb again with it radiating up to her right shoulder. She is right hand dominant.   PERTINENT HISTORY:  History of TIA, asthma,  Chiari malformation, depression, smoker, PTSD, and allergies  PAIN:  Are you having pain? Yes: NPRS scale: 9/10 Pain location: head, neck, and between her shoulder blades Pain description: aching, stiff, throbbing, sore Aggravating factors: bending over, caring for her son, looking down Relieving factors: heat, medication, and massage  PRECAUTIONS: None  WEIGHT BEARING RESTRICTIONS: No  FALLS:  Has patient fallen in last 6 months? 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 Diminished sensation to light touch along C6-7 dermatomes of the RUE   POSTURE: rounded shoulders, forward head, and increased thoracic kyphosis  PALPATION: Bilateral scapular stabilizers, upper trapezius, levator scapulae, and cervical paraspinals  JOINT MOBILITY :  Cervical: hypomobile throughout with C5-T1 reproducing her familiar sharp pain  Thoracic: hypomobile and nonpainful   CERVICAL ROM:   Active ROM A/PROM (deg) eval  Flexion 14; pulling and "razor being jabbing in my back"   Extension 38; pulling and "razor being jabbing in my back"  Right lateral flexion 30; pulling into shoulders   Left lateral flexion 28; pulling into shoulder  Right rotation 59; pulling  Left rotation 58; pulling into the right  shoulder   (Blank rows = not tested)  UPPER EXTREMITY ROM: WFL for activities assessed  UPPER EXTREMITY MMT:  MMT Right eval Left eval  Shoulder flexion 3+/5; mid thoracic spine pain radiating up to B shoulders 4-/5; mid thoracic spine pain radiating up to B shoulders  Shoulder extension    Shoulder abduction    Shoulder adduction    Shoulder extension    Shoulder internal rotation    Shoulder external rotation    Middle trapezius    Lower trapezius    Elbow flexion    Elbow extension    Wrist flexion    Wrist extension    Wrist ulnar deviation    Wrist radial deviation    Wrist pronation    Wrist supination    Grip strength 20 20   (Blank rows = not tested)  CERVICAL SPECIAL TESTS:  Spurling's test: Positive, Distraction test: Positive, and Sharp  pursor's test: Negative  TODAY'S TREATMENT:                                                                                                                              DATE:                                     1/30 EXERCISE LOG  Exercise Repetitions and Resistance Comments  Self STM with tennis ball     Blank cell = exercise not performed today   PATIENT EDUCATION:  Education details: POC, healing, goals for therapy, and prognosis Person educated: Patient Education method: Explanation Education comprehension: verbalized understanding  HOME EXERCISE PROGRAM: Self STM with tennis ball as needed for pain  ASSESSMENT:  CLINICAL IMPRESSION: Patient is a 37 y.o. female who was seen today for physical therapy evaluation and treatment for cervical and thoracic spine pain.  She presented with moderate to high pain severity and irritability with cervical active range of motion and cervical and thoracic joint mobility assessments reproducing her familiar pain.  She exhibited signs and symptoms of cervical radiculopathy as evidenced by her reduced cervical active range of motion and special testing. Recommend that she continue  with skilled physical therapy to address her impairments to return to her prior level of function.   OBJECTIVE IMPAIRMENTS: decreased activity tolerance, decreased mobility, decreased ROM, decreased strength, dizziness, hypomobility, impaired flexibility, impaired sensation, impaired tone, impaired UE functional use, postural dysfunction, and pain.   ACTIVITY LIMITATIONS: carrying, lifting, bending, sleeping, reach over head, and caring for others  PARTICIPATION LIMITATIONS: meal prep, cleaning, laundry, driving, shopping, and community activity  PERSONAL FACTORS: Past/current experiences, Time since onset of injury/illness/exacerbation, and 3+ comorbidities: History of TIA, asthma, Chiari malformation, depression, smoker, PTSD, and allergies  are also affecting patient's functional outcome.   REHAB POTENTIAL: Good  CLINICAL DECISION MAKING: Evolving/moderate complexity  EVALUATION COMPLEXITY: Moderate   GOALS: Goals reviewed with patient? Yes  SHORT TERM GOALS: Target date: 01/23/23  Patient will be independent with her initial HEP. Baseline:  Goal status: INITIAL  2.  Patient will be able to complete her daily activities without her familiar pain exceeding 6/10. Baseline: Up to 9/10 Goal status: INITIAL  3.  Patient will be able to demonstrate at least 60 degrees of cervical rotation bilaterally without being limited by her familiar symptoms for improved awareness while driving. Baseline:  Goal status: INITIAL  4.  Patient will be able to demonstrate improved grip strength bilaterally to at least 30 pounds for improved function carrying her groceries. Baseline:  Goal status: INITIAL  LONG TERM GOALS: Target date: 02/13/23  Patient will be independent with her advanced HEP. Baseline:  Goal status: INITIAL  2.  Patient will be able to complete her daily activities without her familiar pain exceeding 4/10. Baseline:  Goal status: INITIAL  3.  Patient will be able to  demonstrate improved grip strength bilaterally to at least 30 pounds for improved function with her daily activities. Baseline:  Goal status: INITIAL  4.  Patient will be able to demonstrate at least 25 degrees of active cervical flexion for improved function making jewelry. Baseline:   Goal status: INITIAL  PLAN:  PT FREQUENCY: 2x/week  PT DURATION: other: 5 weeks  PLANNED INTERVENTIONS: Therapeutic exercises, Therapeutic activity, Neuromuscular re-education, Patient/Family education, Self Care, Joint mobilization, Joint manipulation, Dry Needling, Electrical stimulation, Spinal manipulation, Spinal mobilization, Cryotherapy, Moist heat, Traction, Ultrasound, Manual therapy, and Re-evaluation  PLAN FOR NEXT SESSION: isometrics, chin tucks, manual therapy, and modalities as needed   Darlin Coco, PT 01/09/2023, 5:32 PM

## 2023-01-16 ENCOUNTER — Ambulatory Visit: Payer: 59 | Attending: Neurology | Admitting: Physical Therapy

## 2023-01-16 ENCOUNTER — Encounter: Payer: Self-pay | Admitting: Physical Therapy

## 2023-01-16 DIAGNOSIS — M542 Cervicalgia: Secondary | ICD-10-CM | POA: Diagnosis not present

## 2023-01-16 DIAGNOSIS — M6281 Muscle weakness (generalized): Secondary | ICD-10-CM

## 2023-01-16 DIAGNOSIS — R293 Abnormal posture: Secondary | ICD-10-CM

## 2023-01-16 DIAGNOSIS — M546 Pain in thoracic spine: Secondary | ICD-10-CM

## 2023-01-16 DIAGNOSIS — M62838 Other muscle spasm: Secondary | ICD-10-CM | POA: Insufficient documentation

## 2023-01-16 NOTE — Therapy (Signed)
OUTPATIENT PHYSICAL THERAPY CERVICAL EVALUATION   Patient Name: Mary [name] MRN: [id number] DOB:[birth year], 37 y.o., female Today's Date: 01/16/2023  END OF SESSION:  PT End of Session - 01/16/23 1119     Visit Number 2    Number of Visits 10    Date for PT Re-Evaluation 03/09/23    PT Start Time 1119    PT Stop Time 1200    PT Time Calculation (min) 41 min    Activity Tolerance Patient tolerated treatment well    Behavior During Therapy WFL for tasks assessed/performed             Past Medical History:  Diagnosis Date   Asthma    Atypical squamous cell changes of undetermined significance (ASCUS) on cervical cytology with positive high risk human papilloma virus (HPV) 07/08/2013   Overview:   ASCUS with positive high risk HPV 07/08/13  Mild to moderate dysplasia on colpo 02/10/14   Plan for repeat colpo in 6 months (Due 08/13/14) schedulers flagged.  ASCUS with positive high risk HPV 07/08/13  Mild to moderate dysplasia on colpo 02/10/14   Plan for repeat colpo in 6 months (Due 08/13/14) schedulers flagged.   Bronchitis 01/31/2016   Chiari malformation type I ([place])    Depression    GERD (gastroesophageal reflux disease)    H/O multiple concussions    Headache(784.0)    History of palpitations    Hx of fracture of nose    Mental disorder    Multiple fractures    Hx: of a leg and arm fracture as a child   Pneumonia    PONV (postoperative nausea and vomiting)    Post traumatic stress disorder (PTSD)    Pregnant 01/21/2016   PTSD (post-traumatic stress disorder)    Sinus infection 01/31/2016   Supervision of normal pregnancy, antepartum 02/08/2016    [facility] at             13+1 week FOB   Ventura Sellers 37 yo WM Dating By  LMP and [place] Pap  02/08/16 GC/CT Initial:                36+wks: Genetic Screen NT/IT:  CF screen  Anatomic [place]  Flu vaccine  Tdap Recommended ~ 28wks Glucose Screen  2 hr GBS  Feed Preference  Contraception  Circumcision  Childbirth  Classes  Pediatrician     Past Surgical History:  Procedure Laterality Date   botox injections     colposcopy     DILATION AND CURETTAGE OF UTERUS     nexplanon     SUBOCCIPITAL CRANIECTOMY CERVICAL LAMINECTOMY N/A 12/16/2018   Procedure: Cervical one Laminectomy/Chiari decompression;  Surgeon: Kary Kos, MD;  Location: [facility];  Service: Neurosurgery;  Laterality: N/A;   WISDOM TOOTH EXTRACTION     Patient Active Problem List   Diagnosis Date Noted   Atypical chest pain 09/25/2022   Migraine with aura and without status migrainosus 07/24/2022   History of posttraumatic stress disorder (PTSD) 04/25/2022   Vitamin D deficiency 09/11/2019   Hyperlipidemia 09/11/2019   Palpitations 07/30/2018   Chiari I malformation ([place]) 02/13/2018   Current smoker 04/20/2017   Asthma 06/04/2013   Depression 06/04/2013   REFERRING PROVIDER: Pieter Partridge, DO   REFERRING DIAG: Cervicalgia   THERAPY DIAG:  Cervicalgia  Pain in thoracic spine  Muscle weakness (generalized)  Abnormal posture  Rationale for Evaluation and Treatment: Rehabilitation  ONSET DATE: August 2023   SUBJECTIVE:  SUBJECTIVE STATEMENT: Pain along both sides of neck  PERTINENT HISTORY:  History of TIA, asthma, Chiari malformation, depression, smoker, PTSD, and allergies  PAIN:  Are you having pain? Yes: NPRS scale: no rating provided today/10 Pain location: head, neck, and between her shoulder blades Pain description: aching, stiff, throbbing, sore Aggravating factors: bending over, caring for her son, looking down Relieving factors: heat, medication, and massage  PRECAUTIONS: None  PATIENT GOALS: be able to make jewelry (be able to look down for prolonged periods) , improve her strength to care for her son, with  reduced pain, and improved function with her daily activities  NEXT MD VISIT: 02/15/23  OBJECTIVE:   PATIENT SURVEYS:  FOTO to be completed at first follow up   COGNITION: Overall cognitive status: Within functional limits for tasks assessed  SENSATION: Patient reports numbness in her right hand radiating up to her shoulder. Diminished sensation to light touch along C6-7 dermatomes of the RUE   POSTURE: rounded shoulders, forward head, and increased thoracic kyphosis  PALPATION: Bilateral scapular stabilizers, upper trapezius, levator scapulae, and cervical paraspinals  JOINT MOBILITY :  Cervical: hypomobile throughout with C5-T1 reproducing her familiar sharp pain  Thoracic: hypomobile and nonpainful   CERVICAL ROM:   Active ROM A/PROM (deg) eval  Flexion 14; pulling and "razor being jabbing in my back"   Extension 38; pulling and "razor being jabbing in my back"  Right lateral flexion 30; pulling into shoulders   Left lateral flexion 28; pulling into shoulder  Right rotation 59; pulling  Left rotation 58; pulling into the right shoulder   (Blank rows = not tested)  UPPER EXTREMITY ROM: WFL for activities assessed  UPPER EXTREMITY MMT:  MMT Right eval Left eval  Shoulder flexion 3+/5; mid thoracic spine pain radiating up to B shoulders 4-/5; mid thoracic spine pain radiating up to B shoulders  Shoulder extension    Shoulder abduction    Shoulder adduction    Shoulder extension    Shoulder internal rotation    Shoulder external rotation    Middle trapezius    Lower trapezius    Elbow flexion    Elbow extension    Wrist flexion    Wrist extension    Wrist ulnar deviation    Wrist radial deviation    Wrist pronation    Wrist supination    Grip strength 20 20   (Blank rows = not tested)  CERVICAL SPECIAL TESTS:  Spurling's test: Positive, Distraction test: Positive, and Sharp pursor's test: Negative  TODAY'S TREATMENT:                                                                                                                               DATE:   Modalities  Date:  Unattended Estim: Cervical, IFC, 15 mins, Pain Combo: Cervical, 1.5 w/cm2, 100% 1 mhz, 10 mins, Pain  Manual Therapy Soft Tissue Mobilization: B UT, cervical paraspinals, STW to reduce tone and  pain    PATIENT EDUCATION:  Education details: POC, healing, goals for therapy, and prognosis Person educated: Patient Education method: Explanation Education comprehension: verbalized understanding  HOME EXERCISE PROGRAM: Self STM with tennis ball as needed for pain  ASSESSMENT:  CLINICAL IMPRESSION: Patient presented in clinic with reports of tightness across her neck and shoulders. Patient indicated with manual therapy to R UT, cervical paraspinals that the pain in her R wrist and hand was relieved. No other complaints during manual therapy or modalities session. Normal modalities response noted following removal of the modalities.  OBJECTIVE IMPAIRMENTS: decreased activity tolerance, decreased mobility, decreased ROM, decreased strength, dizziness, hypomobility, impaired flexibility, impaired sensation, impaired tone, impaired UE functional use, postural dysfunction, and pain.   ACTIVITY LIMITATIONS: carrying, lifting, bending, sleeping, reach over head, and caring for others  PARTICIPATION LIMITATIONS: meal prep, cleaning, laundry, driving, shopping, and community activity  PERSONAL FACTORS: Past/current experiences, Time since onset of injury/illness/exacerbation, and 3+ comorbidities: History of TIA, asthma, Chiari malformation, depression, smoker, PTSD, and allergies  are also affecting patient's functional outcome.   REHAB POTENTIAL: Good  CLINICAL DECISION MAKING: Evolving/moderate complexity  EVALUATION COMPLEXITY: Moderate  GOALS: Goals reviewed with patient? Yes  SHORT TERM GOALS: Target date: 01/23/23  Patient will be independent with her initial  HEP. Baseline:  Goal status: INITIAL  2.  Patient will be able to complete her daily activities without her familiar pain exceeding 6/10. Baseline: Up to 9/10 Goal status: INITIAL  3.  Patient will be able to demonstrate at least 60 degrees of cervical rotation bilaterally without being limited by her familiar symptoms for improved awareness while driving. Baseline:  Goal status: INITIAL  4.  Patient will be able to demonstrate improved grip strength bilaterally to at least 30 pounds for improved function carrying her groceries. Baseline:  Goal status: INITIAL  LONG TERM GOALS: Target date: 02/13/23  Patient will be independent with her advanced HEP. Baseline:  Goal status: INITIAL  2.  Patient will be able to complete her daily activities without her familiar pain exceeding 4/10. Baseline:  Goal status: INITIAL  3.  Patient will be able to demonstrate improved grip strength bilaterally to at least 30 pounds for improved function with her daily activities. Baseline:  Goal status: INITIAL  4.  Patient will be able to demonstrate at least 25 degrees of active cervical flexion for improved function making jewelry. Baseline:   Goal status: INITIAL  PLAN:  PT FREQUENCY: 2x/week  PT DURATION: other: 5 weeks  PLANNED INTERVENTIONS: Therapeutic exercises, Therapeutic activity, Neuromuscular re-education, Patient/Family education, Self Care, Joint mobilization, Joint manipulation, Dry Needling, Electrical stimulation, Spinal manipulation, Spinal mobilization, Cryotherapy, Moist heat, Traction, Ultrasound, Manual therapy, and Re-evaluation  PLAN FOR NEXT SESSION: isometrics, chin tucks, manual therapy, and modalities as needed  Standley Brooking, PTA 01/16/2023, 12:19 PM

## 2023-01-18 ENCOUNTER — Encounter: Payer: 59 | Admitting: Physical Therapy

## 2023-01-19 ENCOUNTER — Ambulatory Visit: Payer: 59 | Admitting: Physical Therapy

## 2023-01-19 DIAGNOSIS — R293 Abnormal posture: Secondary | ICD-10-CM | POA: Diagnosis not present

## 2023-01-19 DIAGNOSIS — M6281 Muscle weakness (generalized): Secondary | ICD-10-CM | POA: Diagnosis not present

## 2023-01-19 DIAGNOSIS — M542 Cervicalgia: Secondary | ICD-10-CM

## 2023-01-19 DIAGNOSIS — M546 Pain in thoracic spine: Secondary | ICD-10-CM | POA: Diagnosis not present

## 2023-01-19 DIAGNOSIS — M62838 Other muscle spasm: Secondary | ICD-10-CM | POA: Diagnosis not present

## 2023-01-19 NOTE — Therapy (Signed)
OUTPATIENT PHYSICAL THERAPY CERVICAL EVALUATION   Patient Name: Mary [name] MRN: [id number] DOB:1986/08/03, 37 y.o., female Today's Date: 01/19/2023  END OF SESSION:  PT End of Session - 01/19/23 1238     Visit Number 3    Number of Visits 10    Date for PT Re-Evaluation 03/09/23    PT Start Time [id number]    PT Stop Time 1212    PT Time Calculation (min) 48 min    Activity Tolerance Patient tolerated treatment well    Behavior During Therapy WFL for tasks assessed/performed             Past Medical History:  Diagnosis Date   Asthma    Atypical squamous cell changes of undetermined significance (ASCUS) on cervical cytology with positive high risk human papilloma virus (HPV) 07/08/2013   Overview:   ASCUS with positive high risk HPV 07/08/13  Mild to moderate dysplasia on colpo 02/10/14   Plan for repeat colpo in 6 months (Due 08/13/14) schedulers flagged.  ASCUS with positive high risk HPV 07/08/13  Mild to moderate dysplasia on colpo 02/10/14   Plan for repeat colpo in 6 months (Due 08/13/14) schedulers flagged.   Bronchitis 01/31/2016   Chiari malformation type I ([place])    Depression    GERD (gastroesophageal reflux disease)    H/O multiple concussions    Headache(784.0)    History of palpitations    Hx of fracture of nose    Mental disorder    Multiple fractures    Hx: of a leg and arm fracture as a child   Pneumonia    PONV (postoperative nausea and vomiting)    Post traumatic stress disorder (PTSD)    Pregnant 01/21/2016   PTSD (post-traumatic stress disorder)    Sinus infection 01/31/2016   Supervision of normal pregnancy, antepartum 02/08/2016    [facility] at             13+1 week FOB   Ventura Sellers 37 yo WM Dating By  LMP and [place] Pap  02/08/16 GC/CT Initial:                36+wks: Genetic Screen NT/IT:  CF screen  Anatomic [place]  Flu vaccine  Tdap Recommended ~ 28wks Glucose Screen  2 hr GBS  Feed Preference  Contraception  Circumcision  Childbirth  Classes  Pediatrician     Past Surgical History:  Procedure Laterality Date   botox injections     colposcopy     DILATION AND CURETTAGE OF UTERUS     nexplanon     SUBOCCIPITAL CRANIECTOMY CERVICAL LAMINECTOMY N/A 12/16/2018   Procedure: Cervical one Laminectomy/Chiari decompression;  Surgeon: Kary Kos, MD;  Location: [place];  Service: Neurosurgery;  Laterality: N/A;   WISDOM TOOTH EXTRACTION     Patient Active Problem List   Diagnosis Date Noted   Atypical chest pain 09/25/2022   Migraine with aura and without status migrainosus 07/24/2022   History of posttraumatic stress disorder (PTSD) 04/25/2022   Vitamin D deficiency 09/11/2019   Hyperlipidemia 09/11/2019   Palpitations 07/30/2018   Chiari I malformation ([place]) 02/13/2018   Current smoker 04/20/2017   Asthma 06/04/2013   Depression 06/04/2013   REFERRING PROVIDER: Pieter Partridge, DO   REFERRING DIAG: Cervicalgia   THERAPY DIAG:  Cervicalgia  Other muscle spasm  Rationale for Evaluation and Treatment: Rehabilitation  ONSET DATE: August 2023   SUBJECTIVE:  SUBJECTIVE STATEMENT: Pain along both sides of neck.  Pain a 7.  PERTINENT HISTORY:  History of TIA, asthma, Chiari malformation, depression, smoker, PTSD, and allergies  PAIN:  Are you having pain? Yes: NPRS scale: no rating provided today7/10 Pain location: head, neck, and between her shoulder blades Pain description: aching, stiff, throbbing, sore Aggravating factors: bending over, caring for her son, looking down Relieving factors: heat, medication, and massage  PRECAUTIONS: None  PATIENT GOALS: be able to make jewelry (be able to look down for prolonged periods) , improve her strength to care for her son, with reduced pain, and improved function with her  daily activities  NEXT MD VISIT: 02/15/23  OBJECTIVE:   PATIENT SURVEYS:  FOTO to be completed at first follow up   COGNITION: Overall cognitive status: Within functional limits for tasks assessed  SENSATION: Patient reports numbness in her right hand radiating up to her shoulder. Diminished sensation to light touch along C6-7 dermatomes of the RUE   POSTURE: rounded shoulders, forward head, and increased thoracic kyphosis  PALPATION: Bilateral scapular stabilizers, upper trapezius, levator scapulae, and cervical paraspinals  JOINT MOBILITY :  Cervical: hypomobile throughout with C5-T1 reproducing her familiar sharp pain  Thoracic: hypomobile and nonpainful   CERVICAL ROM:   Active ROM A/PROM (deg) eval  Flexion 14; pulling and "razor being jabbing in my back"   Extension 38; pulling and "razor being jabbing in my back"  Right lateral flexion 30; pulling into shoulders   Left lateral flexion 28; pulling into shoulder  Right rotation 59; pulling  Left rotation 58; pulling into the right shoulder   (Blank rows = not tested)  UPPER EXTREMITY ROM: WFL for activities assessed  UPPER EXTREMITY MMT:  MMT Right eval Left eval  Shoulder flexion 3+/5; mid thoracic spine pain radiating up to B shoulders 4-/5; mid thoracic spine pain radiating up to B shoulders  Shoulder extension    Shoulder abduction    Shoulder adduction    Shoulder extension    Shoulder internal rotation    Shoulder external rotation    Middle trapezius    Lower trapezius    Elbow flexion    Elbow extension    Wrist flexion    Wrist extension    Wrist ulnar deviation    Wrist radial deviation    Wrist pronation    Wrist supination    Grip strength 20 20   (Blank rows = not tested)  CERVICAL SPECIAL TESTS:  Spurling's test: Positive, Distraction test: Positive, and Sharp pursor's test: Negative  TODAY'S TREATMENT:                                                                                                                               DATE:   Modalities  Date:  HMP and Unattended Estim: Cervical, IFC, 15 mins, Pain Combo: Cervical, 1.5 w/cm2, 100% 1 mhz, 12 mins, Pain to bilateral UT's.  Manual Therapy Soft Tissue Mobilization:  B UT, cervical paraspinals, STW to reduce tone and pain  x 12 minutes.  PATIENT EDUCATION:  Education details: POC, healing, goals for therapy, and prognosis Person educated: Patient Education method: Explanation Education comprehension: verbalized understanding  HOME EXERCISE PROGRAM: Self STM with tennis ball as needed for pain  ASSESSMENT:  CLINICAL IMPRESSION: Increased tone over bilateral UT's.  She had a good response to treatment today and reporting feeling better after treatment.  Patient tolerated treatment without complaint with normal modality response following removal of modality.    OBJECTIVE IMPAIRMENTS: decreased activity tolerance, decreased mobility, decreased ROM, decreased strength, dizziness, hypomobility, impaired flexibility, impaired sensation, impaired tone, impaired UE functional use, postural dysfunction, and pain.   ACTIVITY LIMITATIONS: carrying, lifting, bending, sleeping, reach over head, and caring for others  PARTICIPATION LIMITATIONS: meal prep, cleaning, laundry, driving, shopping, and community activity  PERSONAL FACTORS: Past/current experiences, Time since onset of injury/illness/exacerbation, and 3+ comorbidities: History of TIA, asthma, Chiari malformation, depression, smoker, PTSD, and allergies  are also affecting patient's functional outcome.   REHAB POTENTIAL: Good  CLINICAL DECISION MAKING: Evolving/moderate complexity  EVALUATION COMPLEXITY: Moderate  GOALS: Goals reviewed with patient? Yes  SHORT TERM GOALS: Target date: 01/23/23  Patient will be independent with her initial HEP. Baseline:  Goal status: INITIAL  2.  Patient will be able to complete her daily activities without her  familiar pain exceeding 6/10. Baseline: Up to 9/10 Goal status: INITIAL  3.  Patient will be able to demonstrate at least 60 degrees of cervical rotation bilaterally without being limited by her familiar symptoms for improved awareness while driving. Baseline:  Goal status: INITIAL  4.  Patient will be able to demonstrate improved grip strength bilaterally to at least 30 pounds for improved function carrying her groceries. Baseline:  Goal status: INITIAL  LONG TERM GOALS: Target date: 02/13/23  Patient will be independent with her advanced HEP. Baseline:  Goal status: INITIAL  2.  Patient will be able to complete her daily activities without her familiar pain exceeding 4/10. Baseline:  Goal status: INITIAL  3.  Patient will be able to demonstrate improved grip strength bilaterally to at least 30 pounds for improved function with her daily activities. Baseline:  Goal status: INITIAL  4.  Patient will be able to demonstrate at least 25 degrees of active cervical flexion for improved function making jewelry. Baseline:   Goal status: INITIAL  PLAN:  PT FREQUENCY: 2x/week  PT DURATION: other: 5 weeks  PLANNED INTERVENTIONS: Therapeutic exercises, Therapeutic activity, Neuromuscular re-education, Patient/Family education, Self Care, Joint mobilization, Joint manipulation, Dry Needling, Electrical stimulation, Spinal manipulation, Spinal mobilization, Cryotherapy, Moist heat, Traction, Ultrasound, Manual therapy, and Re-evaluation  PLAN FOR NEXT SESSION: isometrics, chin tucks, manual therapy, and modalities as needed  Damani Rando, [place], PT 01/19/2023, 12:43 PM

## 2023-01-23 ENCOUNTER — Ambulatory Visit: Payer: 59 | Admitting: Physical Therapy

## 2023-01-23 DIAGNOSIS — M542 Cervicalgia: Secondary | ICD-10-CM | POA: Diagnosis not present

## 2023-01-23 DIAGNOSIS — M62838 Other muscle spasm: Secondary | ICD-10-CM | POA: Diagnosis not present

## 2023-01-23 DIAGNOSIS — R293 Abnormal posture: Secondary | ICD-10-CM | POA: Diagnosis not present

## 2023-01-23 DIAGNOSIS — M546 Pain in thoracic spine: Secondary | ICD-10-CM

## 2023-01-23 DIAGNOSIS — M6281 Muscle weakness (generalized): Secondary | ICD-10-CM | POA: Diagnosis not present

## 2023-01-23 NOTE — Therapy (Signed)
OUTPATIENT PHYSICAL THERAPY CERVICAL EVALUATION   Patient Name: Mary [name] MRN: [id number] DOB:[birth year], 37 y.o., female Today's Date: 01/23/2023  END OF SESSION:  PT End of Session - 01/23/23 1205     Visit Number 4    Number of Visits 10    Date for PT Re-Evaluation 03/09/23    PT Start Time 1120    Activity Tolerance Patient tolerated treatment well    Behavior During Therapy [facility] for tasks assessed/performed             Past Medical History:  Diagnosis Date   Asthma    Atypical squamous cell changes of undetermined significance (ASCUS) on cervical cytology with positive high risk human papilloma virus (HPV) 07/08/2013   Overview:   ASCUS with positive high risk HPV 07/08/13  Mild to moderate dysplasia on colpo 02/10/14   Plan for repeat colpo in 6 months (Due 08/13/14) schedulers flagged.  ASCUS with positive high risk HPV 07/08/13  Mild to moderate dysplasia on colpo 02/10/14   Plan for repeat colpo in 6 months (Due 08/13/14) schedulers flagged.   Bronchitis 01/31/2016   Chiari malformation type I ([place])    Depression    GERD (gastroesophageal reflux disease)    H/O multiple concussions    Headache(784.0)    History of palpitations    Hx of fracture of nose    Mental disorder    Multiple fractures    Hx: of a leg and arm fracture as a child   Pneumonia    PONV (postoperative nausea and vomiting)    Post traumatic stress disorder (PTSD)    Pregnant 01/21/2016   PTSD (post-traumatic stress disorder)    Sinus infection 01/31/2016   Supervision of normal pregnancy, antepartum 02/08/2016    [facility] at             13+1 week FOB   Ventura Sellers 37 yo WM Dating By  LMP and [place] Pap  02/08/16 GC/CT Initial:                36+wks: Genetic Screen NT/IT:  CF screen  Anatomic [place]  Flu vaccine  Tdap Recommended ~ 28wks Glucose Screen  2 hr GBS  Feed Preference  Contraception  Circumcision  Childbirth Classes  Pediatrician     Past Surgical History:  Procedure  Laterality Date   botox injections     colposcopy     DILATION AND CURETTAGE OF UTERUS     nexplanon     SUBOCCIPITAL CRANIECTOMY CERVICAL LAMINECTOMY N/A 12/16/2018   Procedure: Cervical one Laminectomy/Chiari decompression;  Surgeon: Kary Kos, MD;  Location: [place];  Service: Neurosurgery;  Laterality: N/A;   WISDOM TOOTH EXTRACTION     Patient Active Problem List   Diagnosis Date Noted   Atypical chest pain 09/25/2022   Migraine with aura and without status migrainosus 07/24/2022   History of posttraumatic stress disorder (PTSD) 04/25/2022   Vitamin D deficiency 09/11/2019   Hyperlipidemia 09/11/2019   Palpitations 07/30/2018   Chiari I malformation ([place]) 02/13/2018   Current smoker 04/20/2017   Asthma 06/04/2013   Depression 06/04/2013   REFERRING PROVIDER: Pieter Partridge, DO   REFERRING DIAG: Cervicalgia   THERAPY DIAG:  Cervicalgia  Other muscle spasm  Pain in thoracic spine  Rationale for Evaluation and Treatment: Rehabilitation  ONSET DATE: August 2023   SUBJECTIVE:  SUBJECTIVE STATEMENT: Pain at a 5/10 but have headache. PERTINENT HISTORY:  History of TIA, asthma, Chiari malformation, depression, smoker, PTSD, and allergies  PAIN:  Are you having pain? Yes: NPRS scale: no rating provided today 5/10 Pain location: head, neck, and between her shoulder blades Pain description: aching, stiff, throbbing, sore Aggravating factors: bending over, caring for her son, looking down Relieving factors: heat, medication, and massage  PRECAUTIONS: None  PATIENT GOALS: be able to make jewelry (be able to look down for prolonged periods) , improve her strength to care for her son, with reduced pain, and improved function with her daily activities  NEXT MD VISIT:  02/15/23  OBJECTIVE:   PATIENT SURVEYS:  FOTO to be completed at first follow up   COGNITION: Overall cognitive status: Within functional limits for tasks assessed  SENSATION: Patient reports numbness in her right hand radiating up to her shoulder. Diminished sensation to light touch along C6-7 dermatomes of the RUE   POSTURE: rounded shoulders, forward head, and increased thoracic kyphosis  PALPATION: Bilateral scapular stabilizers, upper trapezius, levator scapulae, and cervical paraspinals  JOINT MOBILITY :  Cervical: hypomobile throughout with C5-T1 reproducing her familiar sharp pain  Thoracic: hypomobile and nonpainful   CERVICAL ROM:   Active ROM A/PROM (deg) eval  Flexion 14; pulling and "razor being jabbing in my back"   Extension 38; pulling and "razor being jabbing in my back"  Right lateral flexion 30; pulling into shoulders   Left lateral flexion 28; pulling into shoulder  Right rotation 59; pulling  Left rotation 58; pulling into the right shoulder   (Blank rows = not tested)  UPPER EXTREMITY ROM: WFL for activities assessed  UPPER EXTREMITY MMT:  MMT Right eval Left eval  Shoulder flexion 3+/5; mid thoracic spine pain radiating up to B shoulders 4-/5; mid thoracic spine pain radiating up to B shoulders  Shoulder extension    Shoulder abduction    Shoulder adduction    Shoulder extension    Shoulder internal rotation    Shoulder external rotation    Middle trapezius    Lower trapezius    Elbow flexion    Elbow extension    Wrist flexion    Wrist extension    Wrist ulnar deviation    Wrist radial deviation    Wrist pronation    Wrist supination    Grip strength 20 20   (Blank rows = not tested)  CERVICAL SPECIAL TESTS:  Spurling's test: Positive, Distraction test: Positive, and Sharp pursor's test: Negative  TODAY'S TREATMENT:                                                                                                                               DATE: 01/23/23:   Modalities U/S at 1.50 W/CM2 x 12 minutes to bil UT's. HMP and low-level IFC at 80-150 Hz on 40% scan to bilateral UT's.  Manual Therapy STW/M x 12 minutes to bil UT's.  PATIENT EDUCATION:  Education details: POC, healing, goals for therapy, and prognosis Person educated: Patient Education method: Explanation Education comprehension: verbalized understanding  HOME EXERCISE PROGRAM: Self STM with tennis ball as needed for pain  ASSESSMENT:  CLINICAL IMPRESSION: Increased tone over bilateral UT's, left > right.  She had a good response to treatment today and reporting feeling better after treatment.  Patient tolerated treatment without complaint with normal modality response following removal of modality.    OBJECTIVE IMPAIRMENTS: decreased activity tolerance, decreased mobility, decreased ROM, decreased strength, dizziness, hypomobility, impaired flexibility, impaired sensation, impaired tone, impaired UE functional use, postural dysfunction, and pain.   ACTIVITY LIMITATIONS: carrying, lifting, bending, sleeping, reach over head, and caring for others  PARTICIPATION LIMITATIONS: meal prep, cleaning, laundry, driving, shopping, and community activity  PERSONAL FACTORS: Past/current experiences, Time since onset of injury/illness/exacerbation, and 3+ comorbidities: History of TIA, asthma, Chiari malformation, depression, smoker, PTSD, and allergies  are also affecting patient's functional outcome.   REHAB POTENTIAL: Good  CLINICAL DECISION MAKING: Evolving/moderate complexity  EVALUATION COMPLEXITY: Moderate  GOALS: Goals reviewed with patient? Yes  SHORT TERM GOALS: Target date: 01/23/23  Patient will be independent with her initial HEP. Baseline:  Goal status: INITIAL  2.  Patient will be able to complete her daily activities without her familiar pain exceeding 6/10. Baseline: Up to 9/10 Goal status: INITIAL  3.  Patient will be able to  demonstrate at least 60 degrees of cervical rotation bilaterally without being limited by her familiar symptoms for improved awareness while driving. Baseline:  Goal status: INITIAL  4.  Patient will be able to demonstrate improved grip strength bilaterally to at least 30 pounds for improved function carrying her groceries. Baseline:  Goal status: INITIAL  LONG TERM GOALS: Target date: 02/13/23  Patient will be independent with her advanced HEP. Baseline:  Goal status: INITIAL  2.  Patient will be able to complete her daily activities without her familiar pain exceeding 4/10. Baseline:  Goal status: INITIAL  3.  Patient will be able to demonstrate improved grip strength bilaterally to at least 30 pounds for improved function with her daily activities. Baseline:  Goal status: INITIAL  4.  Patient will be able to demonstrate at least 25 degrees of active cervical flexion for improved function making jewelry. Baseline:   Goal status: INITIAL  PLAN:  PT FREQUENCY: 2x/week  PT DURATION: other: 5 weeks  PLANNED INTERVENTIONS: Therapeutic exercises, Therapeutic activity, Neuromuscular re-education, Patient/Family education, Self Care, Joint mobilization, Joint manipulation, Dry Needling, Electrical stimulation, Spinal manipulation, Spinal mobilization, Cryotherapy, Moist heat, Traction, Ultrasound, Manual therapy, and Re-evaluation  PLAN FOR NEXT SESSION: isometrics, chin tucks, manual therapy, and modalities as needed  Aimie Wagman, [place], PT 01/23/2023, 12:10 PM

## 2023-01-24 ENCOUNTER — Ambulatory Visit: Payer: 59 | Attending: Cardiology | Admitting: Cardiology

## 2023-01-24 VITALS — BP 129/88 | HR 73 | Ht 64.0 in | Wt 151.0 lb

## 2023-01-24 DIAGNOSIS — R002 Palpitations: Secondary | ICD-10-CM | POA: Diagnosis not present

## 2023-01-24 DIAGNOSIS — E782 Mixed hyperlipidemia: Secondary | ICD-10-CM | POA: Diagnosis not present

## 2023-01-24 NOTE — Patient Instructions (Signed)
Medication Instructions:  Your physician recommends that you continue on your current medications as directed. Please refer to the Current Medication list given to you today.  *If you need a refill on your cardiac medications before your next appointment, please call your pharmacy*   Lab Work: None If you have labs (blood work) drawn today and your tests are completely normal, you will receive your results only by: [place] (if you have MyChart) OR A paper copy in the mail If you have any lab test that is abnormal or we need to change your treatment, we will call you to review the results.   Testing/Procedures: None   Follow-Up: At [facility], you and your health needs are our priority.  As part of our continuing mission to provide you with exceptional heart care, we have created designated Provider Care Teams.  These Care Teams include your primary Cardiologist (physician) and Advanced Practice Providers (APPs -  Physician Assistants and Nurse Practitioners) who all work together to provide you with the care you need, when you need it.   Your next appointment:   4 month(s)  Provider:   Berniece Salines, DO

## 2023-01-24 NOTE — Progress Notes (Signed)
[facility]io-Obstetrics Clinic  New Evaluation  Date:  01/24/2023   ID:  Phi Cerezo, DOB Jan 08, 1986, MRN [id number]  PCP:  Isaac Bliss, Rayford Halsted, MD   [facility] Cardiologist:  Berniece Salines, DO  Electrophysiologist:  None       Referring MD: Isaac Bliss, Estel*   Chief Complaint: " I am ok"  History of Present Illness:    Mary [name] is a 37 y.o. female [[id number]] who is being seen today for the evaluation of palpitation and chest pain at the request of Isaac Bliss, Estel*.   Medical history includes near syncope, Chiari malformation, PTSD the patient previously saw Dr. Oval Linsey back in 2019-2020.  Her last visit was a televisit in 2020 at which time to discuss her monitor results as well as her echo result.  Since her last appt with me she has delivered. Uneventful delivery.   Prior CV Studies Reviewed: The following studies were reviewed today:  TTE 08/29/2018 Study Conclusions  - Left ventricle: The cavity size was normal. Wall thickness was    normal. Systolic function was normal. The estimated ejection    fraction was in the range of 60% to 65%. Wall motion was normal;    there were no regional wall motion abnormalities. Left    ventricular diastolic function parameters were normal.  - Aortic valve: Valve area (VTI): 1.98 cm^2. Valve area (Vmax):    1.83 cm^2. Valve area (Vmean): 1.9 cm^2.  - Technically adequate study.   Zio monitor 08/29/2018 7 Day Zio Monitor Quality: Fair.  Baseline artifact. Predominant rhythm: sinus rhythm Average heart rate: 94 bpm Max heart rate: 143 bpm Rare PACs    Past Medical History:  Diagnosis Date   Asthma    Atypical squamous cell changes of undetermined significance (ASCUS) on cervical cytology with positive high risk human papilloma virus (HPV) 07/08/2013   Overview:   ASCUS with positive high risk HPV 07/08/13  Mild to moderate dysplasia on colpo 02/10/14   Plan for repeat colpo in 6  months (Due 08/13/14) schedulers flagged.  ASCUS with positive high risk HPV 07/08/13  Mild to moderate dysplasia on colpo 02/10/14   Plan for repeat colpo in 6 months (Due 08/13/14) schedulers flagged.   Bronchitis 01/31/2016   Chiari malformation type I ([place])    Depression    GERD (gastroesophageal reflux disease)    H/O multiple concussions    Headache(784.0)    History of palpitations    Hx of fracture of nose    Mental disorder    Multiple fractures    Hx: of a leg and arm fracture as a child   Pneumonia    PONV (postoperative nausea and vomiting)    Post traumatic stress disorder (PTSD)    Pregnant 01/21/2016   PTSD (post-traumatic stress disorder)    Sinus infection 01/31/2016   Supervision of normal pregnancy, antepartum 02/08/2016    [facility] at             13+1 week FOB   Ventura Sellers 37 yo WM Dating By  LMP and [place] Pap  02/08/16 GC/CT Initial:                36+wks: Genetic Screen NT/IT:  CF screen  Anatomic [place]  Flu vaccine  Tdap Recommended ~ 28wks Glucose Screen  2 hr GBS  Feed Preference  Contraception  Circumcision  Childbirth Classes  Pediatrician      Past Surgical History:  Procedure Laterality Date   botox injections     colposcopy     DILATION AND CURETTAGE OF UTERUS     nexplanon     SUBOCCIPITAL CRANIECTOMY CERVICAL LAMINECTOMY N/A 12/16/2018   Procedure: Cervical one Laminectomy/Chiari decompression;  Surgeon: Kary Kos, MD;  Location: [facility];  Service: Neurosurgery;  Laterality: N/A;   WISDOM TOOTH EXTRACTION        OB History     Gravida  6   Para  5   Term  5   Preterm      AB  1   Living  5      SAB  1   IAB      Ectopic      Multiple  0   Live Births  5               Current Medications: Current Meds  Medication Sig   albuterol (VENTOLIN HFA) 108 (90 Base) MCG/ACT inhaler Inhale 2 puffs into the lungs every 6 (six) hours as needed for wheezing or shortness of breath.   ALPRAZolam (XANAX) 0.5 MG tablet  Take by mouth.   aspirin-acetaminophen-caffeine (EXCEDRIN MIGRAINE) 250-250-65 MG tablet Take by mouth every 6 (six) hours as needed for headache.   cyclobenzaprine (FLEXERIL) 5 MG tablet Take 1 tablet (5 mg total) by mouth 3 (three) times daily as needed for muscle spasms.   ferrous sulfate (FERROUSUL) 325 (65 FE) MG tablet Take 1 tablet (325 mg total) by mouth every other day.   fluticasone (FLONASE) 50 MCG/ACT nasal spray Place 1 spray into both nostrils daily as needed for allergies or rhinitis.   Vitamin D, Ergocalciferol, (DRISDOL) 1.25 MG (50000 UNIT) CAPS capsule Take 1 capsule (50,000 Units total) by mouth every 7 (seven) days.     Allergies:   Codeine, Influenza a (h1n1) monovalent vaccine, Sulfa antibiotics, Eggs or egg-derived products, Peanut-containing drug products, and Robaxin [methocarbamol]   Social History   Socioeconomic History   Marital status: Significant Other    Spouse name: Not on file   Number of children: 4   Years of education: Not on file   Highest education level: GED or equivalent  Occupational History   Occupation: disabled    Comment: PTSD  Tobacco Use   Smoking status: Some Days    Packs/day: 0.50    Types: Cigarettes   Smokeless tobacco: Never   Tobacco comments:    NEVER USE SNUFF OR CHEWING TOBACCO  Vaping Use   Vaping Use: Never used  Substance and Sexual Activity   Alcohol use: Not Currently    Comment: social   Drug use: No   Sexual activity: Yes    Birth control/protection: None  Other Topics Concern   Not on file  Social History Narrative   Patient is right-handed. She is separated, has 4 sons. Is on disabilty for PTSD. She has one caffeine drink daily, walks 3 x week.home is one level   Social Determinants of Health   Financial Resource Strain: Low Risk  (03/31/2020)   Overall Financial Resource Strain (CARDIA)    Difficulty of Paying Living Expenses: Not hard at all  Food Insecurity: No Food Insecurity (12/01/2022)   Hunger  Vital Sign    Worried About Running Out of Food in the Last Year: Never true    Ran Out of Food in the Last Year: Never true  Transportation Needs: No Transportation Needs (12/01/2022)   PRAPARE - Hydrologist (Medical):  No    Lack of Transportation (Non-Medical): No  Physical Activity: Insufficiently Active (03/31/2020)   Exercise Vital Sign    Days of Exercise per Week: 2 days    Minutes of Exercise per Session: 30 min  Stress: No Stress Concern Present (03/31/2020)   [place]    Feeling of Stress : Only a little  Social Connections: Moderately Isolated (03/31/2020)   Social Connection and Isolation Panel [NHANES]    Frequency of Communication with Friends and Family: More than three times a week    Frequency of Social Gatherings with Friends and Family: Once a week    Attends Religious Services: 1 to 4 times per year    Active Member of [employer] or Organizations: No    Attends Music therapist: Never    Marital Status: Divorced      Family History  Problem Relation Age of Onset   Cancer Mother 68       breast   Hypertension Mother    Depression Mother    Depression Father    Seizures Father    Cancer Maternal Grandmother        breast and cervical   Asthma Son    Asthma Son    Drug abuse Paternal Grandmother    Alcohol abuse Paternal Grandmother    Hypertension Other    Cancer Other       ROS:   Please see the history of present illness.     All other systems reviewed and are negative.   Labs/EKG Reviewed:    EKG:   EKG is was  ordered today.  The ekg ordered today demonstrates sinus rhythm.  Recent Labs: 09/28/2022: ALT 11; BUN 8; Creatinine, Ser 0.58; Potassium 3.5; Sodium 136 10/19/2022: TSH 2.760 11/27/2022: Hemoglobin 10.8; Platelets 278   Recent Lipid Panel Lab Results  Component Value Date/Time   CHOL 186 07/07/2020 10:50 AM   TRIG 181 (H)  07/07/2020 10:50 AM   HDL 34 (L) 07/07/2020 10:50 AM   CHOLHDL 5.5 (H) 07/07/2020 10:50 AM   LDLCALC 122 (H) 07/07/2020 10:50 AM    Physical Exam:    VS:  BP 129/88   Pulse 73   Ht 5' 4"$  (1.626 m)   Wt 68.5 kg   LMP 02/26/2022   BMI 25.92 kg/m     Wt Readings from Last 3 Encounters:  01/24/23 68.5 kg  01/04/23 72.4 kg  11/27/22 79.2 kg     GEN:  Well nourished, well developed in no acute distress HEENT: Normal NECK: No JVD; No carotid bruits LYMPHATICS: No lymphadenopathy CARDIAC: RRR, no murmurs, rubs, gallops RESPIRATORY:  Clear to auscultation without rales, wheezing or rhonchi  ABDOMEN: Soft, non-tender, non-distended MUSCULOSKELETAL:  No edema; No deformity  SKIN: Warm and dry NEUROLOGIC:  Alert and oriented x 3 PSYCHIATRIC:  Normal affect    Risk Assessment/Risk Calculators:                  ASSESSMENT & PLAN:    Palpitations  She notes that she is doing well since delivery. She has had a lot of help with the baby. She notes that she has had some intermittent palpitations.   No other complaints at this time.  The patient is in agreement with the above plan. The patient left the office in stable condition.  The

## 2023-01-30 ENCOUNTER — Ambulatory Visit (INDEPENDENT_AMBULATORY_CARE_PROVIDER_SITE_OTHER): Payer: 59 | Admitting: Internal Medicine

## 2023-01-30 ENCOUNTER — Encounter: Payer: Self-pay | Admitting: Internal Medicine

## 2023-01-30 VITALS — BP 110/80 | HR 88 | Temp 98.5°F | Wt 160.2 lb

## 2023-01-30 DIAGNOSIS — F411 Generalized anxiety disorder: Secondary | ICD-10-CM

## 2023-01-30 DIAGNOSIS — F32A Depression, unspecified: Secondary | ICD-10-CM

## 2023-01-30 MED ORDER — ALPRAZOLAM 0.5 MG PO TABS: 0.5000 mg | ORAL_TABLET | Freq: Every evening | ORAL | 2 refills | Status: DC | PRN

## 2023-01-30 NOTE — Progress Notes (Signed)
Established Patient Office Visit     CC/Reason for Visit: Follow-up chronic conditions  HPI: Mary [name] is a 37 y.o. female who is coming in today for the above mentioned reasons. Past Medical History is significant for: Budd-Chiari malformation, anxiety, depression.  I have not seen her for the past year as she was under the care of OB/GYN.  She delivered a healthy baby boy in December and is now coming back to me for routine medical care.  She is feeling well.  She feels like her mood is stable.  Xanax refills are needed.   Past Medical/Surgical History: Past Medical History:  Diagnosis Date   Asthma    Atypical squamous cell changes of undetermined significance (ASCUS) on cervical cytology with positive high risk human papilloma virus (HPV) 07/08/2013   Overview:   ASCUS with positive high risk HPV 07/08/13  Mild to moderate dysplasia on colpo 02/10/14   Plan for repeat colpo in 6 months (Due 08/13/14) schedulers flagged.  ASCUS with positive high risk HPV 07/08/13  Mild to moderate dysplasia on colpo 02/10/14   Plan for repeat colpo in 6 months (Due 08/13/14) schedulers flagged.   Bronchitis 01/31/2016   Chiari malformation type I ([place])    Depression    GERD (gastroesophageal reflux disease)    H/O multiple concussions    Headache(784.0)    History of palpitations    Hx of fracture of nose    Mental disorder    Multiple fractures    Hx: of a leg and arm fracture as a child   Pneumonia    PONV (postoperative nausea and vomiting)    Post traumatic stress disorder (PTSD)    Pregnant 01/21/2016   PTSD (post-traumatic stress disorder)    Sinus infection 01/31/2016   Supervision of normal pregnancy, antepartum 02/08/2016    [facility] at             13+1 week FOB   Ventura Sellers 37 yo WM Dating By  LMP and [place] Pap  02/08/16 GC/CT Initial:                36+wks: Genetic Screen NT/IT:  CF screen  Anatomic [place]  Flu vaccine  Tdap Recommended ~ 28wks Glucose Screen  2  hr GBS  Feed Preference  Contraception  Circumcision  Childbirth Classes  Pediatrician      Past Surgical History:  Procedure Laterality Date   botox injections     colposcopy     DILATION AND CURETTAGE OF UTERUS     nexplanon     SUBOCCIPITAL CRANIECTOMY CERVICAL LAMINECTOMY N/A 12/16/2018   Procedure: Cervical one Laminectomy/Chiari decompression;  Surgeon: Kary Kos, MD;  Location: [facility];  Service: Neurosurgery;  Laterality: N/A;   WISDOM TOOTH EXTRACTION      Social History:  reports that she has been smoking cigarettes. She has been smoking an average of .5 packs per day. She has never used smokeless tobacco. She reports that she does not currently use alcohol. She reports that she does not use drugs.  Allergies: Allergies  Allergen Reactions   Codeine Shortness Of Breath and Other (See Comments)    Can not take in liquid form but does not recall exactly what formulation she had Has taken vicodin and percocet without problem per pt   Influenza A (H1n1) Monovalent Vaccine Anaphylaxis   Sulfa Antibiotics Anaphylaxis and Itching   Eggs Or Egg-Derived Products    Peanut-Containing Drug Products  Robaxin [Methocarbamol] Other (See Comments)    Migraine    Family History:  Family History  Problem Relation Age of Onset   Cancer Mother 50       breast   Hypertension Mother    Depression Mother    Depression Father    Seizures Father    Cancer Maternal Grandmother        breast and cervical   Asthma Son    Asthma Son    Drug abuse Paternal Grandmother    Alcohol abuse Paternal Grandmother    Hypertension Other    Cancer Other      Current Outpatient Medications:    albuterol (VENTOLIN HFA) 108 (90 Base) MCG/ACT inhaler, Inhale 2 puffs into the lungs every 6 (six) hours as needed for wheezing or shortness of breath., Disp: 1 g, Rfl: 2   aspirin-acetaminophen-caffeine (EXCEDRIN MIGRAINE) 250-250-65 MG tablet, Take by mouth every 6 (six) hours as needed for headache.,  Disp: , Rfl:    cyclobenzaprine (FLEXERIL) 5 MG tablet, Take 1 tablet (5 mg total) by mouth 3 (three) times daily as needed for muscle spasms., Disp: 90 tablet, Rfl: 5   ferrous sulfate (FERROUSUL) 325 (65 FE) MG tablet, Take 1 tablet (325 mg total) by mouth every other day., Disp: 30 tablet, Rfl: 3   fluticasone (FLONASE) 50 MCG/ACT nasal spray, Place 1 spray into both nostrils daily as needed for allergies or rhinitis., Disp: , Rfl:    prenatal vitamin w/FE, FA (PRENATAL 1 + 1) 27-1 MG TABS tablet, Take 1 tablet by mouth daily at 12 noon., Disp: 30 tablet, Rfl: 12   Vitamin D, Ergocalciferol, (DRISDOL) 1.25 MG (50000 UNIT) CAPS capsule, Take 1 capsule (50,000 Units total) by mouth every 7 (seven) days., Disp: 5 capsule, Rfl: 0   ALPRAZolam (XANAX) 0.5 MG tablet, Take 1 tablet (0.5 mg total) by mouth at bedtime as needed for anxiety., Disp: 30 tablet, Rfl: 2  Review of Systems:  Negative unless indicated in HPI.   Physical Exam: Vitals:   01/30/23 1143  BP: 110/80  Pulse: 88  Temp: 98.5 F (36.9 C)  TempSrc: Oral  SpO2: 98%  Weight: 160 lb 3.2 oz (72.7 kg)    Body mass index is 27.5 kg/m.   Physical Exam Vitals reviewed.  Constitutional:      Appearance: Normal appearance.  HENT:     Head: Normocephalic and atraumatic.  Eyes:     Conjunctiva/sclera: Conjunctivae normal.     Pupils: Pupils are equal, round, and reactive to light.  Cardiovascular:     Rate and Rhythm: Normal rate and regular rhythm.  Pulmonary:     Effort: Pulmonary effort is normal.     Breath sounds: Normal breath sounds.  Skin:    General: Skin is warm and dry.  Neurological:     General: No focal deficit present.     Mental Status: She is alert and oriented to person, place, and time.  Psychiatric:        Mood and Affect: Mood normal.        Behavior: Behavior normal.        Thought Content: Thought content normal.        Judgment: Judgment normal.      Impression and Plan:  Depression,  unspecified depression type  GAD (generalized anxiety disorder) - Plan: ALPRAZolam (XANAX) 0.5 MG tablet  -Mood is stable, I have provided her a brochure to assist her in finding a new therapist.  Xanax prescription sent.  Time spent:30 minutes reviewing chart, interviewing and examining patient and formulating plan of care.     Lelon Frohlich, MD  Primary Care at [facility] [place]

## 2023-01-31 ENCOUNTER — Telehealth: Payer: Medicare Other | Admitting: Cardiology

## 2023-02-02 ENCOUNTER — Encounter: Payer: Self-pay | Admitting: Physical Therapy

## 2023-02-02 ENCOUNTER — Ambulatory Visit: Payer: 59 | Admitting: Physical Therapy

## 2023-02-02 DIAGNOSIS — M62838 Other muscle spasm: Secondary | ICD-10-CM | POA: Diagnosis not present

## 2023-02-02 DIAGNOSIS — M6281 Muscle weakness (generalized): Secondary | ICD-10-CM | POA: Diagnosis not present

## 2023-02-02 DIAGNOSIS — M542 Cervicalgia: Secondary | ICD-10-CM | POA: Diagnosis not present

## 2023-02-02 DIAGNOSIS — M546 Pain in thoracic spine: Secondary | ICD-10-CM | POA: Diagnosis not present

## 2023-02-02 DIAGNOSIS — R293 Abnormal posture: Secondary | ICD-10-CM | POA: Diagnosis not present

## 2023-02-02 NOTE — Therapy (Signed)
OUTPATIENT PHYSICAL THERAPY CERVICAL TREATMENT   Patient Name: Mary [name] MRN: [id number] DOB:14-Jan-1986, 37 y.o., female Today's Date: 02/02/2023  END OF SESSION:  PT End of Session - 02/02/23 1034     Visit Number 5    Number of Visits 10    Date for PT Re-Evaluation 03/09/23    PT Start Time [id number]    PT Stop Time 1115    PT Time Calculation (min) 40 min    Activity Tolerance Patient tolerated treatment well    Behavior During Therapy [facility] for tasks assessed/performed            Past Medical History:  Diagnosis Date   Asthma    Atypical squamous cell changes of undetermined significance (ASCUS) on cervical cytology with positive high risk human papilloma virus (HPV) 07/08/2013   Overview:   ASCUS with positive high risk HPV 07/08/13  Mild to moderate dysplasia on colpo 02/10/14   Plan for repeat colpo in 6 months (Due 08/13/14) schedulers flagged.  ASCUS with positive high risk HPV 07/08/13  Mild to moderate dysplasia on colpo 02/10/14   Plan for repeat colpo in 6 months (Due 08/13/14) schedulers flagged.   Bronchitis 01/31/2016   Chiari malformation type I ([place])    Depression    GERD (gastroesophageal reflux disease)    H/O multiple concussions    Headache(784.0)    History of palpitations    Hx of fracture of nose    Mental disorder    Multiple fractures    Hx: of a leg and arm fracture as a child   Pneumonia    PONV (postoperative nausea and vomiting)    Post traumatic stress disorder (PTSD)    Pregnant 01/21/2016   PTSD (post-traumatic stress disorder)    Sinus infection 01/31/2016   Supervision of normal pregnancy, antepartum 02/08/2016    [facility] at             13+1 week FOB   Ventura Sellers 37 yo WM Dating By  LMP and [place] Pap  02/08/16 GC/CT Initial:                36+wks: Genetic Screen NT/IT:  CF screen  Anatomic [place]  Flu vaccine  Tdap Recommended ~ 28wks Glucose Screen  2 hr GBS  Feed Preference  Contraception  Circumcision  Childbirth  Classes  Pediatrician     Past Surgical History:  Procedure Laterality Date   botox injections     colposcopy     DILATION AND CURETTAGE OF UTERUS     nexplanon     SUBOCCIPITAL CRANIECTOMY CERVICAL LAMINECTOMY N/A 12/16/2018   Procedure: Cervical one Laminectomy/Chiari decompression;  Surgeon: Kary Kos, MD;  Location: [facility];  Service: Neurosurgery;  Laterality: N/A;   WISDOM TOOTH EXTRACTION     Patient Active Problem List   Diagnosis Date Noted   Atypical chest pain 09/25/2022   Migraine with aura and without status migrainosus 07/24/2022   History of posttraumatic stress disorder (PTSD) 04/25/2022   Vitamin D deficiency 09/11/2019   Hyperlipidemia 09/11/2019   Palpitations 07/30/2018   Chiari I malformation ([place]) 02/13/2018   Current smoker 04/20/2017   Asthma 06/04/2013   Depression 06/04/2013   REFERRING PROVIDER: Pieter Partridge, DO   REFERRING DIAG: Cervicalgia   THERAPY DIAG:  Cervicalgia  Other muscle spasm  Rationale for Evaluation and Treatment: Rehabilitation  ONSET DATE: August 2023   SUBJECTIVE:  SUBJECTIVE STATEMENT: Has had better sleep lately. Patient reports more pain yesterday.  PERTINENT HISTORY:  History of TIA, asthma, Chiari malformation, depression, smoker, PTSD, and allergies  PAIN:  Are you having pain? Yes: NPRS scale: 5/10 Pain location: R neck/shoulder Pain description: tightness Aggravating factors: bending over, caring for her son, looking down Relieving factors: heat, medication, and massage  PRECAUTIONS: None  PATIENT GOALS: be able to make jewelry (be able to look down for prolonged periods) , improve her strength to care for her son, with reduced pain, and improved function with her daily activities  NEXT MD VISIT:  02/15/23  OBJECTIVE:   PATIENT SURVEYS:  FOTO to be completed at first follow up   SENSATION: Patient reports numbness in her right hand radiating up to her shoulder. Diminished sensation to light touch along C6-7 dermatomes of the RUE   POSTURE: rounded shoulders, forward head, and increased thoracic kyphosis  PALPATION: Bilateral scapular stabilizers, upper trapezius, levator scapulae, and cervical paraspinals  JOINT MOBILITY :  Cervical: hypomobile throughout with C5-T1 reproducing her familiar sharp pain  Thoracic: hypomobile and nonpainful   CERVICAL ROM:   Active ROM A/PROM (deg) eval  Flexion 14; pulling and "razor being jabbing in my back"   Extension 38; pulling and "razor being jabbing in my back"  Right lateral flexion 30; pulling into shoulders   Left lateral flexion 28; pulling into shoulder  Right rotation 59; pulling  Left rotation 58; pulling into the right shoulder   (Blank rows = not tested)  UPPER EXTREMITY ROM: WFL for activities assessed  UPPER EXTREMITY MMT:  MMT Right eval Left eval  Shoulder flexion 3+/5; mid thoracic spine pain radiating up to B shoulders 4-/5; mid thoracic spine pain radiating up to B shoulders  Shoulder extension    Shoulder abduction    Shoulder adduction    Shoulder extension    Shoulder internal rotation    Shoulder external rotation    Middle trapezius    Lower trapezius    Elbow flexion    Elbow extension    Wrist flexion    Wrist extension    Wrist ulnar deviation    Wrist radial deviation    Wrist pronation    Wrist supination    Grip strength 20 20   (Blank rows = not tested)  CERVICAL SPECIAL TESTS:  Spurling's test: Positive, Distraction test: Positive, and Sharp pursor's test: Negative  TODAY'S TREATMENT:                                                                                                                              DATE: 02/02/23:   Manual Therapy Soft Tissue Mobilization: R UT, cervical  paraspinals, to reduce tightness  Modalities  Date: 02/02/23 Unattended Estim: Cervical, pre-mod, 15 mins, Pain and Tone Combo: Cervical, 1.5 w/cm2, 100%, 1 mhz, 10 mins, Pain and Tone Hot Pack: Cervical, 15 mins, Pain and Tone  PATIENT EDUCATION:  Education details: POC,  healing, goals for therapy, and prognosis Person educated: Patient Education method: Explanation Education comprehension: verbalized understanding  HOME EXERCISE PROGRAM: Self STM with tennis ball as needed for pain  ASSESSMENT:  CLINICAL IMPRESSION: Patient presented with greater muscle tightness of R UT region. Patient able to tolerate moderate pressure with manual therapy into UT and cervical paraspinals. No complaints during STW session. Normal modalities response noted following removal of the modalities.   OBJECTIVE IMPAIRMENTS: decreased activity tolerance, decreased mobility, decreased ROM, decreased strength, dizziness, hypomobility, impaired flexibility, impaired sensation, impaired tone, impaired UE functional use, postural dysfunction, and pain.   ACTIVITY LIMITATIONS: carrying, lifting, bending, sleeping, reach over head, and caring for others  PARTICIPATION LIMITATIONS: meal prep, cleaning, laundry, driving, shopping, and community activity  PERSONAL FACTORS: Past/current experiences, Time since onset of injury/illness/exacerbation, and 3+ comorbidities: History of TIA, asthma, Chiari malformation, depression, smoker, PTSD, and allergies  are also affecting patient's functional outcome.   REHAB POTENTIAL: Good  CLINICAL DECISION MAKING: Evolving/moderate complexity  EVALUATION COMPLEXITY: Moderate  GOALS: Goals reviewed with patient? Yes  SHORT TERM GOALS: Target date: 01/23/23  Patient will be independent with her initial HEP. Baseline:  Goal status: INITIAL  2.  Patient will be able to complete her daily activities without her familiar pain exceeding 6/10. Baseline: Up to 9/10 Goal  status: INITIAL  3.  Patient will be able to demonstrate at least 60 degrees of cervical rotation bilaterally without being limited by her familiar symptoms for improved awareness while driving. Baseline:  Goal status: INITIAL  4.  Patient will be able to demonstrate improved grip strength bilaterally to at least 30 pounds for improved function carrying her groceries. Baseline:  Goal status: INITIAL  LONG TERM GOALS: Target date: 02/13/23  Patient will be independent with her advanced HEP. Baseline:  Goal status: INITIAL  2.  Patient will be able to complete her daily activities without her familiar pain exceeding 4/10. Baseline:  Goal status: INITIAL  3.  Patient will be able to demonstrate improved grip strength bilaterally to at least 30 pounds for improved function with her daily activities. Baseline:  Goal status: INITIAL  4.  Patient will be able to demonstrate at least 25 degrees of active cervical flexion for improved function making jewelry. Baseline:   Goal status: INITIAL  PLAN:  PT FREQUENCY: 2x/week  PT DURATION: other: 5 weeks  PLANNED INTERVENTIONS: Therapeutic exercises, Therapeutic activity, Neuromuscular re-education, Patient/Family education, Self Care, Joint mobilization, Joint manipulation, Dry Needling, Electrical stimulation, Spinal manipulation, Spinal mobilization, Cryotherapy, Moist heat, Traction, Ultrasound, Manual therapy, and Re-evaluation  PLAN FOR NEXT SESSION: isometrics, chin tucks, manual therapy, and modalities as needed  Standley Brooking, PTA 02/02/2023, 12:34 PM

## 2023-02-07 ENCOUNTER — Ambulatory Visit: Payer: 59 | Admitting: Certified Nurse Midwife

## 2023-02-07 ENCOUNTER — Encounter: Payer: Self-pay | Admitting: Certified Nurse Midwife

## 2023-02-08 ENCOUNTER — Encounter: Payer: Self-pay | Admitting: Neurology

## 2023-02-09 ENCOUNTER — Ambulatory Visit: Payer: 59 | Attending: Neurology | Admitting: *Deleted

## 2023-02-09 ENCOUNTER — Encounter: Payer: Self-pay | Admitting: *Deleted

## 2023-02-09 DIAGNOSIS — M542 Cervicalgia: Secondary | ICD-10-CM | POA: Diagnosis not present

## 2023-02-09 DIAGNOSIS — M546 Pain in thoracic spine: Secondary | ICD-10-CM | POA: Insufficient documentation

## 2023-02-09 DIAGNOSIS — M62838 Other muscle spasm: Secondary | ICD-10-CM | POA: Insufficient documentation

## 2023-02-09 NOTE — Therapy (Signed)
OUTPATIENT PHYSICAL THERAPY CERVICAL TREATMENT   Patient Name: Mary [name] MRN: [id number] DOB:03-19-1986, 37 y.o., female Today's Date: 02/09/2023  END OF SESSION:  PT End of Session - 02/09/23 1230     Visit Number 6    Number of Visits 10    Date for PT Re-Evaluation 03/09/23    PT Start Time 1034    PT Stop Time 1122    PT Time Calculation (min) 48 min            Past Medical History:  Diagnosis Date   Asthma    Atypical squamous cell changes of undetermined significance (ASCUS) on cervical cytology with positive high risk human papilloma virus (HPV) 07/08/2013   Overview:   ASCUS with positive high risk HPV 07/08/13  Mild to moderate dysplasia on colpo 02/10/14   Plan for repeat colpo in 6 months (Due 08/13/14) schedulers flagged.  ASCUS with positive high risk HPV 07/08/13  Mild to moderate dysplasia on colpo 02/10/14   Plan for repeat colpo in 6 months (Due 08/13/14) schedulers flagged.   Bronchitis 01/31/2016   Chiari malformation type I ([place])    Depression    GERD (gastroesophageal reflux disease)    H/O multiple concussions    Headache(784.0)    History of palpitations    Hx of fracture of nose    Mental disorder    Multiple fractures    Hx: of a leg and arm fracture as a child   Pneumonia    PONV (postoperative nausea and vomiting)    Post traumatic stress disorder (PTSD)    Pregnant 01/21/2016   PTSD (post-traumatic stress disorder)    Sinus infection 01/31/2016   Supervision of normal pregnancy, antepartum 02/08/2016    [facility] at             13+1 week FOB   Ventura Sellers 37 yo WM Dating By  LMP and [place] Pap  02/08/16 GC/CT Initial:                36+wks: Genetic Screen NT/IT:  CF screen  Anatomic [place]  Flu vaccine  Tdap Recommended ~ 28wks Glucose Screen  2 hr GBS  Feed Preference  Contraception  Circumcision  Childbirth Classes  Pediatrician     Past Surgical History:  Procedure Laterality Date   botox injections     colposcopy      DILATION AND CURETTAGE OF UTERUS     nexplanon     SUBOCCIPITAL CRANIECTOMY CERVICAL LAMINECTOMY N/A 12/16/2018   Procedure: Cervical one Laminectomy/Chiari decompression;  Surgeon: Kary Kos, MD;  Location: [facility];  Service: Neurosurgery;  Laterality: N/A;   WISDOM TOOTH EXTRACTION     Patient Active Problem List   Diagnosis Date Noted   Atypical chest pain 09/25/2022   Migraine with aura and without status migrainosus 07/24/2022   History of posttraumatic stress disorder (PTSD) 04/25/2022   Vitamin D deficiency 09/11/2019   Hyperlipidemia 09/11/2019   Palpitations 07/30/2018   Chiari I malformation ([place]) 02/13/2018   Current smoker 04/20/2017   Asthma 06/04/2013   Depression 06/04/2013   REFERRING PROVIDER: Pieter Partridge, DO   REFERRING DIAG: Cervicalgia   THERAPY DIAG:  Cervicalgia  Other muscle spasm  Rationale for Evaluation and Treatment: Rehabilitation  ONSET DATE: August 2023   SUBJECTIVE:  SUBJECTIVE STATEMENT:  Not doing as well today with neck. I'm hurting on both side today.  PERTINENT HISTORY:  History of TIA, asthma, Chiari malformation, depression, smoker, PTSD, and allergies  PAIN:  Are you having pain? Yes: NPRS scale: 7/10 Pain location: R neck/shoulder Pain description: tightness Aggravating factors: bending over, caring for her son, looking down Relieving factors: heat, medication, and massage  PRECAUTIONS: None  PATIENT GOALS: be able to make jewelry (be able to look down for prolonged periods) , improve her strength to care for her son, with reduced pain, and improved function with her daily activities  NEXT MD VISIT: 02/15/23  OBJECTIVE:   PATIENT SURVEYS:  FOTO to be completed at first follow up   SENSATION: Patient reports numbness in her  right hand radiating up to her shoulder. Diminished sensation to light touch along C6-7 dermatomes of the RUE   POSTURE: rounded shoulders, forward head, and increased thoracic kyphosis  PALPATION: Bilateral scapular stabilizers, upper trapezius, levator scapulae, and cervical paraspinals  JOINT MOBILITY :  Cervical: hypomobile throughout with C5-T1 reproducing her familiar sharp pain  Thoracic: hypomobile and nonpainful   CERVICAL ROM:   Active ROM A/PROM (deg) eval  Flexion 14; pulling and "razor being jabbing in my back"   Extension 38; pulling and "razor being jabbing in my back"  Right lateral flexion 30; pulling into shoulders   Left lateral flexion 28; pulling into shoulder  Right rotation 59; pulling  Left rotation 58; pulling into the right shoulder   (Blank rows = not tested)  UPPER EXTREMITY ROM: WFL for activities assessed  UPPER EXTREMITY MMT:  MMT Right eval Left eval  Shoulder flexion 3+/5; mid thoracic spine pain radiating up to B shoulders 4-/5; mid thoracic spine pain radiating up to B shoulders  Shoulder extension    Shoulder abduction    Shoulder adduction    Shoulder extension    Shoulder internal rotation    Shoulder external rotation    Middle trapezius    Lower trapezius    Elbow flexion    Elbow extension    Wrist flexion    Wrist extension    Wrist ulnar deviation    Wrist radial deviation    Wrist pronation    Wrist supination    Grip strength 20 20   (Blank rows = not tested)  CERVICAL SPECIAL TESTS:  Spurling's test: Positive, Distraction test: Positive, and Sharp pursor's test: Negative  TODAY'S TREATMENT:                                                                                                                              DATE: 02/02/23:   Manual Therapy Soft Tissue Mobilization: R and LT  UT Levator  scap, cervical paraspinals, to reduce tightness  Modalities  Date: 02/02/23 Unattended Estim: Cervical, pre-mod, 15 mins,  Pain and Tone Combo: Cervical, 1.5 w/cm2, 100%, 1 mhz, 10 mins, Pain and Tone Hot Pack: Cervical, 15 mins,  Pain and Tone Modalities to Bil. UT's Levator scap and cerv. Paras. PATIENT EDUCATION:  Education details: POC, healing, goals for therapy, and prognosis Person educated: Patient Education method: Explanation Education comprehension: verbalized understanding  HOME EXERCISE PROGRAM: Self STM with tennis ball as needed for pain  ASSESSMENT:  CLINICAL IMPRESSION: Patient presented today with increased pain on LT and RT side Cerv. Paras as well as UT's. Rx focused on pain management today with [place] combo, STW, and IFC. Decreased pain end of session.   OBJECTIVE IMPAIRMENTS: decreased activity tolerance, decreased mobility, decreased ROM, decreased strength, dizziness, hypomobility, impaired flexibility, impaired sensation, impaired tone, impaired UE functional use, postural dysfunction, and pain.   ACTIVITY LIMITATIONS: carrying, lifting, bending, sleeping, reach over head, and caring for others  PARTICIPATION LIMITATIONS: meal prep, cleaning, laundry, driving, shopping, and community activity  PERSONAL FACTORS: Past/current experiences, Time since onset of injury/illness/exacerbation, and 3+ comorbidities: History of TIA, asthma, Chiari malformation, depression, smoker, PTSD, and allergies  are also affecting patient's functional outcome.   REHAB POTENTIAL: Good  CLINICAL DECISION MAKING: Evolving/moderate complexity  EVALUATION COMPLEXITY: Moderate  GOALS: Goals reviewed with patient? Yes  SHORT TERM GOALS: Target date: 01/23/23  Patient will be independent with her initial HEP. Baseline:  Goal status: INITIAL  2.  Patient will be able to complete her daily activities without her familiar pain exceeding 6/10. Baseline: Up to 9/10 Goal status: INITIAL  3.  Patient will be able to demonstrate at least 60 degrees of cervical rotation bilaterally without being limited by her  familiar symptoms for improved awareness while driving. Baseline:  Goal status: INITIAL  4.  Patient will be able to demonstrate improved grip strength bilaterally to at least 30 pounds for improved function carrying her groceries. Baseline:  Goal status: INITIAL  LONG TERM GOALS: Target date: 02/13/23  Patient will be independent with her advanced HEP. Baseline:  Goal status: INITIAL  2.  Patient will be able to complete her daily activities without her familiar pain exceeding 4/10. Baseline:  Goal status: INITIAL  3.  Patient will be able to demonstrate improved grip strength bilaterally to at least 30 pounds for improved function with her daily activities. Baseline:  Goal status: INITIAL  4.  Patient will be able to demonstrate at least 25 degrees of active cervical flexion for improved function making jewelry. Baseline:   Goal status: INITIAL  PLAN:  PT FREQUENCY: 2x/week  PT DURATION: other: 5 weeks  PLANNED INTERVENTIONS: Therapeutic exercises, Therapeutic activity, Neuromuscular re-education, Patient/Family education, Self Care, Joint mobilization, Joint manipulation, Dry Needling, Electrical stimulation, Spinal manipulation, Spinal mobilization, Cryotherapy, Moist heat, Traction, Ultrasound, Manual therapy, and Re-evaluation  PLAN FOR NEXT SESSION: isometrics, chin tucks, manual therapy, and modalities as needed  Jorge Amparo,CHRIS, PTA 02/09/2023, 12:38 PM

## 2023-02-14 ENCOUNTER — Encounter: Payer: Self-pay | Admitting: Physical Therapy

## 2023-02-14 ENCOUNTER — Ambulatory Visit: Payer: 59 | Admitting: Physical Therapy

## 2023-02-14 DIAGNOSIS — M542 Cervicalgia: Secondary | ICD-10-CM

## 2023-02-14 DIAGNOSIS — M62838 Other muscle spasm: Secondary | ICD-10-CM

## 2023-02-14 DIAGNOSIS — M546 Pain in thoracic spine: Secondary | ICD-10-CM | POA: Diagnosis not present

## 2023-02-14 NOTE — Therapy (Signed)
OUTPATIENT PHYSICAL THERAPY CERVICAL TREATMENT   Patient Name: Mary [name] MRN: [id number] DOB:07-17-1986, 37 y.o., female Today's Date: 02/14/2023  END OF SESSION:  PT End of Session - 02/14/23 1037     Visit Number 7    Number of Visits 10    Date for PT Re-Evaluation 03/09/23    PT Start Time [id number]    PT Stop Time 1118    PT Time Calculation (min) 38 min    Activity Tolerance Patient tolerated treatment well    Behavior During Therapy WFL for tasks assessed/performed            Past Medical History:  Diagnosis Date   Asthma    Atypical squamous cell changes of undetermined significance (ASCUS) on cervical cytology with positive high risk human papilloma virus (HPV) 07/08/2013   Overview:   ASCUS with positive high risk HPV 07/08/13  Mild to moderate dysplasia on colpo 02/10/14   Plan for repeat colpo in 6 months (Due 08/13/14) schedulers flagged.  ASCUS with positive high risk HPV 07/08/13  Mild to moderate dysplasia on colpo 02/10/14   Plan for repeat colpo in 6 months (Due 08/13/14) schedulers flagged.   Bronchitis 01/31/2016   Chiari malformation type I ([place])    Depression    GERD (gastroesophageal reflux disease)    H/O multiple concussions    Headache(784.0)    History of palpitations    Hx of fracture of nose    Mental disorder    Multiple fractures    Hx: of a leg and arm fracture as a child   Pneumonia    PONV (postoperative nausea and vomiting)    Post traumatic stress disorder (PTSD)    Pregnant 01/21/2016   PTSD (post-traumatic stress disorder)    Sinus infection 01/31/2016   Supervision of normal pregnancy, antepartum 02/08/2016    [facility] at             13+1 week FOB   Ventura Sellers 37 yo WM Dating By  LMP and [place] Pap  02/08/16 GC/CT Initial:                36+wks: Genetic Screen NT/IT:  CF screen  Anatomic [place]  Flu vaccine  Tdap Recommended ~ 28wks Glucose Screen  2 hr GBS  Feed Preference  Contraception  Circumcision  Childbirth Classes   Pediatrician     Past Surgical History:  Procedure Laterality Date   botox injections     colposcopy     DILATION AND CURETTAGE OF UTERUS     nexplanon     SUBOCCIPITAL CRANIECTOMY CERVICAL LAMINECTOMY N/A 12/16/2018   Procedure: Cervical one Laminectomy/Chiari decompression;  Surgeon: Kary Kos, MD;  Location: [place];  Service: Neurosurgery;  Laterality: N/A;   WISDOM TOOTH EXTRACTION     Patient Active Problem List   Diagnosis Date Noted   Atypical chest pain 09/25/2022   Migraine with aura and without status migrainosus 07/24/2022   History of posttraumatic stress disorder (PTSD) 04/25/2022   Vitamin D deficiency 09/11/2019   Hyperlipidemia 09/11/2019   Palpitations 07/30/2018   Chiari I malformation ([place]) 02/13/2018   Current smoker 04/20/2017   Asthma 06/04/2013   Depression 06/04/2013   REFERRING PROVIDER: Pieter Partridge, DO   REFERRING DIAG: Cervicalgia   THERAPY DIAG:  Cervicalgia  Other muscle spasm  Rationale for Evaluation and Treatment: Rehabilitation  ONSET DATE: August 2023   SUBJECTIVE:  SUBJECTIVE STATEMENT: More pain the last several days and into her eyes. Going to talk to her neurologist.  PERTINENT HISTORY:  History of TIA, asthma, Chiari malformation, depression, smoker, PTSD, and allergies  PAIN:  Are you having pain? Yes: NPRS scale: no pain score provided/10 Pain location: R neck/shoulder Pain description: tightness Aggravating factors: bending over, caring for her son, looking down Relieving factors: heat, medication, and massage  PRECAUTIONS: None  PATIENT GOALS: be able to make jewelry (be able to look down for prolonged periods) , improve her strength to care for her son, with reduced pain, and improved function with her daily  activities  NEXT MD VISIT: 02/15/23  OBJECTIVE:   PATIENT SURVEYS:  FOTO to be completed at first follow up   SENSATION: Patient reports numbness in her right hand radiating up to her shoulder. Diminished sensation to light touch along C6-7 dermatomes of the RUE   POSTURE: rounded shoulders, forward head, and increased thoracic kyphosis  PALPATION: Bilateral scapular stabilizers, upper trapezius, levator scapulae, and cervical paraspinals  JOINT MOBILITY :  Cervical: hypomobile throughout with C5-T1 reproducing her familiar sharp pain  Thoracic: hypomobile and nonpainful   CERVICAL ROM:   Active ROM A/PROM (deg) eval  Flexion 14; pulling and "razor being jabbing in my back"   Extension 38; pulling and "razor being jabbing in my back"  Right lateral flexion 30; pulling into shoulders   Left lateral flexion 28; pulling into shoulder  Right rotation 59; pulling  Left rotation 58; pulling into the right shoulder   (Blank rows = not tested)  UPPER EXTREMITY ROM: WFL for activities assessed  UPPER EXTREMITY MMT:  MMT Right eval Left eval  Shoulder flexion 3+/5; mid thoracic spine pain radiating up to B shoulders 4-/5; mid thoracic spine pain radiating up to B shoulders  Shoulder extension    Shoulder abduction    Shoulder adduction    Shoulder extension    Shoulder internal rotation    Shoulder external rotation    Middle trapezius    Lower trapezius    Elbow flexion    Elbow extension    Wrist flexion    Wrist extension    Wrist ulnar deviation    Wrist radial deviation    Wrist pronation    Wrist supination    Grip strength 20 20   (Blank rows = not tested)  CERVICAL SPECIAL TESTS:  Spurling's test: Positive, Distraction test: Positive, and Sharp pursor's test: Negative  TODAY'S TREATMENT:                                                                                                                              DATE: 02/14/23   Manual Therapy Soft Tissue  Mobilization: R and LT  UT Levator  scap, cervical paraspinals with suboccipital release, to reduce tightness and pain  Modalities  Date: 02/14/23 Unattended Estim: Cervical, pre-mod, 10 mins, Pain and Tone Combo: Cervical, 1.5 w/cm2, 100%, 1 mhz,  10 mins, Pain and Tone Hot Pack: Cervical, 10 mins, Pain and Tone  PATIENT EDUCATION:  Education details: POC, healing, goals for therapy, and prognosis Person educated: Patient Education method: Explanation Education comprehension: verbalized understanding  HOME EXERCISE PROGRAM: Self STM with tennis ball as needed for pain  ASSESSMENT:  CLINICAL IMPRESSION: Patient presented in clinic with reports of greater L UT pain and inflammation as well as pain in suboccipital region. Patient able to tolerate STW well with no complaints of pain. Suboccipital release completed in supine in order to reduce tightness present as well. Patient concerned about the pain she has been experiencing and plans to talk with her neurologist about it this month. Normal modalities response noted following removal of the modalities.   OBJECTIVE IMPAIRMENTS: decreased activity tolerance, decreased mobility, decreased ROM, decreased strength, dizziness, hypomobility, impaired flexibility, impaired sensation, impaired tone, impaired UE functional use, postural dysfunction, and pain.   ACTIVITY LIMITATIONS: carrying, lifting, bending, sleeping, reach over head, and caring for others  PARTICIPATION LIMITATIONS: meal prep, cleaning, laundry, driving, shopping, and community activity  PERSONAL FACTORS: Past/current experiences, Time since onset of injury/illness/exacerbation, and 3+ comorbidities: History of TIA, asthma, Chiari malformation, depression, smoker, PTSD, and allergies  are also affecting patient's functional outcome.   REHAB POTENTIAL: Good  CLINICAL DECISION MAKING: Evolving/moderate complexity  EVALUATION COMPLEXITY: Moderate  GOALS: Goals reviewed with  patient? Yes  SHORT TERM GOALS: Target date: 01/23/23  Patient will be independent with her initial HEP. Baseline:  Goal status: On-going  2.  Patient will be able to complete her daily activities without her familiar pain exceeding 6/10. Baseline: Up to 9/10 Goal status: On-going  3.  Patient will be able to demonstrate at least 60 degrees of cervical rotation bilaterally without being limited by her familiar symptoms for improved awareness while driving. Baseline:  Goal status: On-going  4.  Patient will be able to demonstrate improved grip strength bilaterally to at least 30 pounds for improved function carrying her groceries. Baseline:  Goal status: On-going  LONG TERM GOALS: Target date: 02/13/23  Patient will be independent with her advanced HEP. Baseline:  Goal status: On-going  2.  Patient will be able to complete her daily activities without her familiar pain exceeding 4/10. Baseline:  Goal status: On-going  3.  Patient will be able to demonstrate improved grip strength bilaterally to at least 30 pounds for improved function with her daily activities. Baseline:  Goal status: On-going  4.  Patient will be able to demonstrate at least 25 degrees of active cervical flexion for improved function making jewelry. Baseline:   Goal status: On-going  PLAN:  PT FREQUENCY: 2x/week  PT DURATION: other: 5 weeks  PLANNED INTERVENTIONS: Therapeutic exercises, Therapeutic activity, Neuromuscular re-education, Patient/Family education, Self Care, Joint mobilization, Joint manipulation, Dry Needling, Electrical stimulation, Spinal manipulation, Spinal mobilization, Cryotherapy, Moist heat, Traction, Ultrasound, Manual therapy, and Re-evaluation  PLAN FOR NEXT SESSION: isometrics, chin tucks, manual therapy, and modalities as needed  Standley Brooking, PTA 02/14/2023, 3:17 PM
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
## 2023-02-15 ENCOUNTER — Ambulatory Visit (INDEPENDENT_AMBULATORY_CARE_PROVIDER_SITE_OTHER): Payer: 59 | Admitting: Neurology

## 2023-02-15 VITALS — BP 137/80 | HR 106 | Ht 64.0 in | Wt 159.0 lb

## 2023-02-15 DIAGNOSIS — G935 Compression of brain: Secondary | ICD-10-CM

## 2023-02-15 DIAGNOSIS — O219 Vomiting of pregnancy, unspecified: Secondary | ICD-10-CM

## 2023-02-15 MED ORDER — ONDANSETRON 4 MG PO TBDP: 4.0000 mg | ORAL_TABLET | Freq: Three times a day (TID) | ORAL | 5 refills | Status: DC | PRN

## 2023-02-15 MED ORDER — DIAZEPAM 5 MG PO TABS: ORAL_TABLET | ORAL | 0 refills | Status: DC

## 2023-02-15 MED ORDER — RIZATRIPTAN BENZOATE 10 MG PO TABS: ORAL_TABLET | ORAL | 5 refills | Status: DC

## 2023-02-15 NOTE — Patient Instructions (Addendum)
Start Emgality every 28 days Rizatriptan an Zofran as needed MRI brain without contrast.We have sent a referral to [facility] for your MRI and they will call you directly to schedule your appointment. They are located at [place]. If you need to contact them directly please call [phone].  Follow up 5 months.

## 2023-02-16 ENCOUNTER — Encounter: Payer: Self-pay | Admitting: Neurology

## 2023-02-16 ENCOUNTER — Encounter: Payer: Self-pay | Admitting: Physical Therapy

## 2023-02-16 ENCOUNTER — Ambulatory Visit: Payer: 59 | Admitting: Physical Therapy

## 2023-02-16 DIAGNOSIS — M546 Pain in thoracic spine: Secondary | ICD-10-CM | POA: Diagnosis not present

## 2023-02-16 DIAGNOSIS — M542 Cervicalgia: Secondary | ICD-10-CM

## 2023-02-16 DIAGNOSIS — M62838 Other muscle spasm: Secondary | ICD-10-CM | POA: Diagnosis not present

## 2023-02-16 NOTE — Therapy (Signed)
OUTPATIENT PHYSICAL THERAPY CERVICAL TREATMENT   Patient Name: Mary [name] MRN: [id number] DOB:1986-07-30, 37 y.o., female Today's Date: 02/16/2023  END OF SESSION:  PT End of Session - 02/16/23 1036     Visit Number 8    Number of Visits 10    Date for PT Re-Evaluation 03/09/23    PT Start Time [id number]    PT Stop Time 1118    PT Time Calculation (min) 41 min    Activity Tolerance Patient tolerated treatment well    Behavior During Therapy WFL for tasks assessed/performed            Past Medical History:  Diagnosis Date   Asthma    Atypical squamous cell changes of undetermined significance (ASCUS) on cervical cytology with positive high risk human papilloma virus (HPV) 07/08/2013   Overview:   ASCUS with positive high risk HPV 07/08/13  Mild to moderate dysplasia on colpo 02/10/14   Plan for repeat colpo in 6 months (Due 08/13/14) schedulers flagged.  ASCUS with positive high risk HPV 07/08/13  Mild to moderate dysplasia on colpo 02/10/14   Plan for repeat colpo in 6 months (Due 08/13/14) schedulers flagged.   Bronchitis 01/31/2016   Chiari malformation type I ([place])    Depression    GERD (gastroesophageal reflux disease)    H/O multiple concussions    Headache(784.0)    History of palpitations    Hx of fracture of nose    Mental disorder    Multiple fractures    Hx: of a leg and arm fracture as a child   Pneumonia    PONV (postoperative nausea and vomiting)    Post traumatic stress disorder (PTSD)    Pregnant 01/21/2016   PTSD (post-traumatic stress disorder)    Sinus infection 01/31/2016   Supervision of normal pregnancy, antepartum 02/08/2016    [facility] at             13+1 week FOB   Ventura Sellers 37 yo WM Dating By  LMP and [place] Pap  02/08/16 GC/CT Initial:                36+wks: Genetic Screen NT/IT:  CF screen  Anatomic [place]  Flu vaccine  Tdap Recommended ~ 28wks Glucose Screen  2 hr GBS  Feed Preference  Contraception  Circumcision  Childbirth Classes   Pediatrician     Past Surgical History:  Procedure Laterality Date   botox injections     colposcopy     DILATION AND CURETTAGE OF UTERUS     nexplanon     SUBOCCIPITAL CRANIECTOMY CERVICAL LAMINECTOMY N/A 12/16/2018   Procedure: Cervical one Laminectomy/Chiari decompression;  Surgeon: Kary Kos, MD;  Location: [facility];  Service: Neurosurgery;  Laterality: N/A;   WISDOM TOOTH EXTRACTION     Patient Active Problem List   Diagnosis Date Noted   Atypical chest pain 09/25/2022   Migraine with aura and without status migrainosus 07/24/2022   History of posttraumatic stress disorder (PTSD) 04/25/2022   Vitamin D deficiency 09/11/2019   Hyperlipidemia 09/11/2019   Palpitations 07/30/2018   Chiari I malformation ([place]) 02/13/2018   Current smoker 04/20/2017   Asthma 06/04/2013   Depression 06/04/2013   REFERRING PROVIDER: Pieter Partridge, DO   REFERRING DIAG: Cervicalgia   THERAPY DIAG:  Cervicalgia  Other muscle spasm  Pain in thoracic spine  Rationale for Evaluation and Treatment: Rehabilitation  ONSET DATE: August 2023   SUBJECTIVE:  SUBJECTIVE STATEMENT: Still having mild dizziness. Still has pain primarily in superior cervical area radiating to both sides.  PERTINENT HISTORY:  History of TIA, asthma, Chiari malformation, depression, smoker, PTSD, and allergies  PAIN:  Are you having pain? Yes: NPRS scale: 5/10 Pain location: R neck/shoulder Pain description: tightness Aggravating factors: bending over, caring for her son, looking down Relieving factors: heat, medication, and massage  PRECAUTIONS: None  PATIENT GOALS: be able to make jewelry (be able to look down for prolonged periods) , improve her strength to care for her son, with reduced pain, and improved function with  her daily activities  NEXT MD VISIT: 02/15/23  OBJECTIVE:   PATIENT SURVEYS:  FOTO to be completed at first follow up   SENSATION: Patient reports numbness in her right hand radiating up to her shoulder. Diminished sensation to light touch along C6-7 dermatomes of the RUE   POSTURE: rounded shoulders, forward head, and increased thoracic kyphosis  PALPATION: Bilateral scapular stabilizers, upper trapezius, levator scapulae, and cervical paraspinals  JOINT MOBILITY :  Cervical: hypomobile throughout with C5-T1 reproducing her familiar sharp pain  Thoracic: hypomobile and nonpainful   CERVICAL ROM:   Active ROM A/PROM (deg) eval  Flexion 14; pulling and "razor being jabbing in my back"   Extension 38; pulling and "razor being jabbing in my back"  Right lateral flexion 30; pulling into shoulders   Left lateral flexion 28; pulling into shoulder  Right rotation 59; pulling  Left rotation 58; pulling into the right shoulder   (Blank rows = not tested)  UPPER EXTREMITY ROM: WFL for activities assessed  UPPER EXTREMITY MMT:  MMT Right eval Left eval  Shoulder flexion 3+/5; mid thoracic spine pain radiating up to B shoulders 4-/5; mid thoracic spine pain radiating up to B shoulders  Shoulder extension    Shoulder abduction    Shoulder adduction    Shoulder extension    Shoulder internal rotation    Shoulder external rotation    Middle trapezius    Lower trapezius    Elbow flexion    Elbow extension    Wrist flexion    Wrist extension    Wrist ulnar deviation    Wrist radial deviation    Wrist pronation    Wrist supination    Grip strength 20 20   (Blank rows = not tested)  CERVICAL SPECIAL TESTS:  Spurling's test: Positive, Distraction test: Positive, and Sharp pursor's test: Negative  TODAY'S TREATMENT:                                                                                                                              DATE: 02/16/23   Manual Therapy Soft  Tissue Mobilization: R UT Levator  scap,  B cervical paraspinals, to reduce tightness and pain Modalities  Date: 02/16/23 Unattended Estim: Cervical, IFC, 10 mins, Pain and Tone Combo: Cervical, 1.5 w/cm2, 100%, 1 mhz, 10 mins, Pain and Tone  PATIENT EDUCATION:  Education details: POC, healing, goals for therapy, and prognosis Person educated: Patient Education method: Explanation Education comprehension: verbalized understanding  HOME EXERCISE PROGRAM: Self STM with tennis ball as needed for pain  ASSESSMENT:  CLINICAL IMPRESSION: Patient presented in clinic with reports of mild dizziness but continued upper cervical pain. Patient did present with greater tone of R cervical musculature compared to L side. Patient had no complaints or soreness or tenderness during manual therapy session. Normal modalities response noted following removal of the modalities. Patient had no complaints following end of treatments.   OBJECTIVE IMPAIRMENTS: decreased activity tolerance, decreased mobility, decreased ROM, decreased strength, dizziness, hypomobility, impaired flexibility, impaired sensation, impaired tone, impaired UE functional use, postural dysfunction, and pain.   ACTIVITY LIMITATIONS: carrying, lifting, bending, sleeping, reach over head, and caring for others  PARTICIPATION LIMITATIONS: meal prep, cleaning, laundry, driving, shopping, and community activity  PERSONAL FACTORS: Past/current experiences, Time since onset of injury/illness/exacerbation, and 3+ comorbidities: History of TIA, asthma, Chiari malformation, depression, smoker, PTSD, and allergies  are also affecting patient's functional outcome.   REHAB POTENTIAL: Good  CLINICAL DECISION MAKING: Evolving/moderate complexity  EVALUATION COMPLEXITY: Moderate  GOALS: Goals reviewed with patient? Yes  SHORT TERM GOALS: Target date: 01/23/23  Patient will be independent with her initial HEP. Baseline:  Goal status:  On-going  2.  Patient will be able to complete her daily activities without her familiar pain exceeding 6/10. Baseline: Up to 9/10 Goal status: On-going  3.  Patient will be able to demonstrate at least 60 degrees of cervical rotation bilaterally without being limited by her familiar symptoms for improved awareness while driving. Baseline:  Goal status: On-going  4.  Patient will be able to demonstrate improved grip strength bilaterally to at least 30 pounds for improved function carrying her groceries. Baseline:  Goal status: On-going  LONG TERM GOALS: Target date: 02/13/23  Patient will be independent with her advanced HEP. Baseline:  Goal status: On-going  2.  Patient will be able to complete her daily activities without her familiar pain exceeding 4/10. Baseline:  Goal status: On-going  3.  Patient will be able to demonstrate improved grip strength bilaterally to at least 30 pounds for improved function with her daily activities. Baseline:  Goal status: On-going  4.  Patient will be able to demonstrate at least 25 degrees of active cervical flexion for improved function making jewelry. Baseline:   Goal status: On-going  PLAN:  PT FREQUENCY: 2x/week  PT DURATION: other: 5 weeks  PLANNED INTERVENTIONS: Therapeutic exercises, Therapeutic activity, Neuromuscular re-education, Patient/Family education, Self Care, Joint mobilization, Joint manipulation, Dry Needling, Electrical stimulation, Spinal manipulation, Spinal mobilization, Cryotherapy, Moist heat, Traction, Ultrasound, Manual therapy, and Re-evaluation  PLAN FOR NEXT SESSION: isometrics, chin tucks, manual therapy, and modalities as needed  Standley Brooking, PTA 02/16/2023, 11:56 AM

## 2023-02-21 ENCOUNTER — Ambulatory Visit: Payer: 59 | Admitting: Physical Therapy

## 2023-02-21 DIAGNOSIS — M546 Pain in thoracic spine: Secondary | ICD-10-CM

## 2023-02-21 DIAGNOSIS — M542 Cervicalgia: Secondary | ICD-10-CM | POA: Diagnosis not present

## 2023-02-21 DIAGNOSIS — M62838 Other muscle spasm: Secondary | ICD-10-CM | POA: Diagnosis not present

## 2023-02-21 NOTE — Therapy (Signed)
OUTPATIENT PHYSICAL THERAPY CERVICAL TREATMENT   Patient Name: Mary [name] MRN: [id number] DOB:21-Jul-1986, 37 y.o., female Today's Date: 02/21/2023  END OF SESSION:  PT End of Session - 02/21/23 1238     Visit Number 9    Number of Visits 10    Date for PT Re-Evaluation 03/09/23    PT Start Time 1120    PT Stop Time 1209    PT Time Calculation (min) 49 min    Activity Tolerance Patient tolerated treatment well    Behavior During Therapy WFL for tasks assessed/performed            Past Medical History:  Diagnosis Date   Asthma    Atypical squamous cell changes of undetermined significance (ASCUS) on cervical cytology with positive high risk human papilloma virus (HPV) 07/08/2013   Overview:   ASCUS with positive high risk HPV 07/08/13  Mild to moderate dysplasia on colpo 02/10/14   Plan for repeat colpo in 6 months (Due 08/13/14) schedulers flagged.  ASCUS with positive high risk HPV 07/08/13  Mild to moderate dysplasia on colpo 02/10/14   Plan for repeat colpo in 6 months (Due 08/13/14) schedulers flagged.   Bronchitis 01/31/2016   Chiari malformation type I ([place])    Depression    GERD (gastroesophageal reflux disease)    H/O multiple concussions    Headache(784.0)    History of palpitations    Hx of fracture of nose    Mental disorder    Multiple fractures    Hx: of a leg and arm fracture as a child   Pneumonia    PONV (postoperative nausea and vomiting)    Post traumatic stress disorder (PTSD)    Pregnant 01/21/2016   PTSD (post-traumatic stress disorder)    Sinus infection 01/31/2016   Supervision of normal pregnancy, antepartum 02/08/2016    [facility]             13+1 week FOB   Ventura Sellers 37 yo WM Dating By  LMP and [place] Pap  02/08/16 GC/CT Initial:                36+wks: Genetic Screen NT/IT:  CF screen  Anatomic [place]  Flu vaccine  Tdap Recommended ~ 28wks Glucose Screen  2 hr GBS  Feed Preference  Contraception  Circumcision  Childbirth  Classes  Pediatrician     Past Surgical History:  Procedure Laterality Date   botox injections     colposcopy     DILATION AND CURETTAGE OF UTERUS     nexplanon     SUBOCCIPITAL CRANIECTOMY CERVICAL LAMINECTOMY N/A 12/16/2018   Procedure: Cervical one Laminectomy/Chiari decompression;  Surgeon: Kary Kos, MD;  Location: [place];  Service: Neurosurgery;  Laterality: N/A;   WISDOM TOOTH EXTRACTION     Patient Active Problem List   Diagnosis Date Noted   Atypical chest pain 09/25/2022   Migraine with aura and without status migrainosus 07/24/2022   History of posttraumatic stress disorder (PTSD) 04/25/2022   Vitamin D deficiency 09/11/2019   Hyperlipidemia 09/11/2019   Palpitations 07/30/2018   Chiari I malformation ([place]) 02/13/2018   Current smoker 04/20/2017   Asthma 06/04/2013   Depression 06/04/2013   REFERRING PROVIDER: Pieter Partridge, DO   REFERRING DIAG: Cervicalgia   THERAPY DIAG:  Cervicalgia  Other muscle spasm  Pain in thoracic spine  Rationale for Evaluation and Treatment: Rehabilitation  ONSET DATE: August 2023   SUBJECTIVE:  SUBJECTIVE STATEMENT: Doing well.  Took a muscle relaxer.  Pain localized to right side. PERTINENT HISTORY:  History of TIA, asthma, Chiari malformation, depression, smoker, PTSD, and allergies  PAIN:  Are you having pain? Yes: NPRS scale: 3/10 Pain location: R neck/shoulder Pain description: tightness Aggravating factors: bending over, caring for her son, looking down Relieving factors: heat, medication, and massage  PRECAUTIONS: None  PATIENT GOALS: be able to make jewelry (be able to look down for prolonged periods) , improve her strength to care for her son, with reduced pain, and improved function with her daily activities  NEXT MD  VISIT: 02/15/23  OBJECTIVE:   PATIENT SURVEYS:  FOTO to be completed at first follow up   SENSATION: Patient reports numbness in her right hand radiating up to her shoulder. Diminished sensation to light touch along C6-7 dermatomes of the RUE   POSTURE: rounded shoulders, forward head, and increased thoracic kyphosis  PALPATION: Bilateral scapular stabilizers, upper trapezius, levator scapulae, and cervical paraspinals  JOINT MOBILITY :  Cervical: hypomobile throughout with C5-T1 reproducing her familiar sharp pain  Thoracic: hypomobile and nonpainful   CERVICAL ROM:   Active ROM A/PROM (deg) eval  Flexion 14; pulling and "razor being jabbing in my back"   Extension 38; pulling and "razor being jabbing in my back"  Right lateral flexion 30; pulling into shoulders   Left lateral flexion 28; pulling into shoulder  Right rotation 59; pulling  Left rotation 58; pulling into the right shoulder   (Blank rows = not tested)  UPPER EXTREMITY ROM: WFL for activities assessed  UPPER EXTREMITY MMT:  MMT Right eval Left eval  Shoulder flexion 3+/5; mid thoracic spine pain radiating up to B shoulders 4-/5; mid thoracic spine pain radiating up to B shoulders  Shoulder extension    Shoulder abduction    Shoulder adduction    Shoulder extension    Shoulder internal rotation    Shoulder external rotation    Middle trapezius    Lower trapezius    Elbow flexion    Elbow extension    Wrist flexion    Wrist extension    Wrist ulnar deviation    Wrist radial deviation    Wrist pronation    Wrist supination    Grip strength 20 20   (Blank rows = not tested)  CERVICAL SPECIAL TESTS:  Spurling's test: Positive, Distraction test: Positive, and Sharp pursor's test: Negative  TODAY'S TREATMENT:                                                                                                                              DATE: 02/21/23  Combo e'stim/US at 1.50 W/CM2 x 12 minutes to  patient's right upper thoracic/Lev scap region f/b    Manual Therapy Soft Tissue Mobilization: R UT Levator  scap,  B cervical paraspinals, to reduce tightness and pain  x minutes with ischemic release technique utilized. F/b HMP and low-level IFC at  80-150 Hz on 40% scan   PATIENT EDUCATION:  Education details: POC, healing, goals for therapy, and prognosis Person educated: Patient Education method: Explanation Education comprehension: verbalized understanding  HOME EXERCISE PROGRAM: Self STM with tennis ball as needed for pain  ASSESSMENT:  CLINICAL IMPRESSION: Much better today.  Took a muscle relaxer.  Pain essentially localized to right Levator Scapulae today.   Patient tolerated treatment without complaint with normal modality response following removal of modality.  OBJECTIVE IMPAIRMENTS: decreased activity tolerance, decreased mobility, decreased ROM, decreased strength, dizziness, hypomobility, impaired flexibility, impaired sensation, impaired tone, impaired UE functional use, postural dysfunction, and pain.   ACTIVITY LIMITATIONS: carrying, lifting, bending, sleeping, reach over head, and caring for others  PARTICIPATION LIMITATIONS: meal prep, cleaning, laundry, driving, shopping, and community activity  PERSONAL FACTORS: Past/current experiences, Time since onset of injury/illness/exacerbation, and 3+ comorbidities: History of TIA, asthma, Chiari malformation, depression, smoker, PTSD, and allergies  are also affecting patient's functional outcome.   REHAB POTENTIAL: Good  CLINICAL DECISION MAKING: Evolving/moderate complexity  EVALUATION COMPLEXITY: Moderate  GOALS: Goals reviewed with patient? Yes  SHORT TERM GOALS: Target date: 01/23/23  Patient will be independent with her initial HEP. Baseline:  Goal status: On-going  2.  Patient will be able to complete her daily activities without her familiar pain exceeding 6/10. Baseline: Up to 9/10 Goal status:  On-going  3.  Patient will be able to demonstrate at least 60 degrees of cervical rotation bilaterally without being limited by her familiar symptoms for improved awareness while driving. Baseline:  Goal status: On-going  4.  Patient will be able to demonstrate improved grip strength bilaterally to at least 30 pounds for improved function carrying her groceries. Baseline:  Goal status: On-going  LONG TERM GOALS: Target date: 02/13/23  Patient will be independent with her advanced HEP. Baseline:  Goal status: On-going  2.  Patient will be able to complete her daily activities without her familiar pain exceeding 4/10. Baseline:  Goal status: On-going  3.  Patient will be able to demonstrate improved grip strength bilaterally to at least 30 pounds for improved function with her daily activities. Baseline:  Goal status: On-going  4.  Patient will be able to demonstrate at least 25 degrees of active cervical flexion for improved function making jewelry. Baseline:   Goal status: On-going  PLAN:  PT FREQUENCY: 2x/week  PT DURATION: other: 5 weeks  PLANNED INTERVENTIONS: Therapeutic exercises, Therapeutic activity, Neuromuscular re-education, Patient/Family education, Self Care, Joint mobilization, Joint manipulation, Dry Needling, Electrical stimulation, Spinal manipulation, Spinal mobilization, Cryotherapy, Moist heat, Traction, Ultrasound, Manual therapy, and Re-evaluation  PLAN FOR NEXT SESSION: isometrics, chin tucks, manual therapy, and modalities as needed  Maliyah Willets, [place], PT 02/21/2023, 12:43 PM

## 2023-02-22 ENCOUNTER — Encounter: Payer: Self-pay | Admitting: Physical Therapy

## 2023-02-27 ENCOUNTER — Encounter: Payer: 59 | Admitting: Physical Therapy

## 2023-03-02 ENCOUNTER — Encounter: Payer: 59 | Admitting: Physical Therapy

## 2023-03-06 ENCOUNTER — Encounter: Payer: Self-pay | Admitting: Neurology

## 2023-03-06 ENCOUNTER — Encounter: Payer: Self-pay | Admitting: Internal Medicine

## 2023-03-06 DIAGNOSIS — F411 Generalized anxiety disorder: Secondary | ICD-10-CM

## 2023-03-13 ENCOUNTER — Encounter: Payer: Self-pay | Admitting: Internal Medicine

## 2023-03-13 MED ORDER — ALPRAZOLAM 0.5 MG PO TABS: 0.5000 mg | ORAL_TABLET | Freq: Every evening | ORAL | 2 refills | Status: DC | PRN

## 2023-03-15 ENCOUNTER — Ambulatory Visit: Payer: 59 | Admitting: Obstetrics and Gynecology

## 2023-03-15 ENCOUNTER — Encounter: Payer: Self-pay | Admitting: Obstetrics and Gynecology

## 2023-03-21 ENCOUNTER — Encounter: Payer: Self-pay | Admitting: Physical Therapy

## 2023-03-26 ENCOUNTER — Telehealth: Payer: Self-pay | Admitting: Internal Medicine

## 2023-03-26 NOTE — Telephone Encounter (Signed)
Patient would like to go back to Xanax 0.25 mg three times a day as needed.

## 2023-03-26 NOTE — Telephone Encounter (Signed)
Pt call and stated she want you to give her a call back she stated she sent a mychart message a wk ago and no one call her back.

## 2023-03-27 MED ORDER — ALPRAZOLAM 0.25 MG PO TABS: 0.2500 mg | ORAL_TABLET | Freq: Three times a day (TID) | ORAL | 0 refills | Status: DC | PRN

## 2023-03-27 NOTE — Telephone Encounter (Signed)
Refill sent.

## 2023-03-29 ENCOUNTER — Encounter: Payer: Self-pay | Admitting: Physical Therapy

## 2023-03-29 ENCOUNTER — Ambulatory Visit: Payer: 59 | Attending: Neurology | Admitting: Physical Therapy

## 2023-03-29 DIAGNOSIS — M542 Cervicalgia: Secondary | ICD-10-CM | POA: Insufficient documentation

## 2023-03-29 DIAGNOSIS — M62838 Other muscle spasm: Secondary | ICD-10-CM | POA: Insufficient documentation

## 2023-03-29 NOTE — Therapy (Signed)
OUTPATIENT PHYSICAL THERAPY CERVICAL TREATMENT   Patient Name: Mary [name] MRN: [id number] DOB:[birth year], 37 y.o., female Today's Date: 03/29/2023  END OF SESSION:  PT End of Session - 03/29/23 1034     Visit Number 10    Number of Visits 10    Date for PT Re-Evaluation 03/09/23    PT Start Time 1034    PT Stop Time 1116    PT Time Calculation (min) 42 min    Activity Tolerance Patient tolerated treatment well    Behavior During Therapy WFL for tasks assessed/performed            Past Medical History:  Diagnosis Date   Asthma    Atypical squamous cell changes of undetermined significance (ASCUS) on cervical cytology with positive high risk human papilloma virus (HPV) 07/08/2013   Overview:   ASCUS with positive high risk HPV 07/08/13  Mild to moderate dysplasia on colpo 02/10/14   Plan for repeat colpo in 6 months (Due 08/13/14) schedulers flagged.  ASCUS with positive high risk HPV 07/08/13  Mild to moderate dysplasia on colpo 02/10/14   Plan for repeat colpo in 6 months (Due 08/13/14) schedulers flagged.   Bronchitis 01/31/2016   Chiari malformation type I    Depression    GERD (gastroesophageal reflux disease)    H/O multiple concussions    Headache(784.0)    History of palpitations    Hx of fracture of nose    Mental disorder    Multiple fractures    Hx: of a leg and arm fracture as a child   Pneumonia    PONV (postoperative nausea and vomiting)    Post traumatic stress disorder (PTSD)    Pregnant 01/21/2016   PTSD (post-traumatic stress disorder)    Sinus infection 01/31/2016   Supervision of normal pregnancy, antepartum 02/08/2016    [facility]             13+1 week FOB   [name] 37 yo WM Dating By  LMP and [place] Pap  02/08/16 GC/CT Initial:                36+wks: Genetic Screen NT/IT:  CF screen  Anatomic [place]  Flu vaccine  Tdap Recommended ~ 28wks Glucose Screen  2 hr GBS  Feed Preference  Contraception  Circumcision  Childbirth Classes   Pediatrician     Past Surgical History:  Procedure Laterality Date   botox injections     colposcopy     DILATION AND CURETTAGE OF UTERUS     nexplanon     SUBOCCIPITAL CRANIECTOMY CERVICAL LAMINECTOMY N/A 12/16/2018   Procedure: Cervical one Laminectomy/Chiari decompression;  Surgeon: Donalee Citrin, MD;  Location: [facility] OR;  Service: Neurosurgery;  Laterality: N/A;   WISDOM TOOTH EXTRACTION     Patient Active Problem List   Diagnosis Date Noted   Atypical chest pain 09/25/2022   Migraine with aura and without status migrainosus 07/24/2022   History of posttraumatic stress disorder (PTSD) 04/25/2022   Vitamin D deficiency 09/11/2019   Hyperlipidemia 09/11/2019   Palpitations 07/30/2018   Chiari I malformation 02/13/2018   Current smoker 04/20/2017   Asthma 06/04/2013   Depression 06/04/2013   REFERRING PROVIDER: Drema Dallas, DO   REFERRING DIAG: Cervicalgia   THERAPY DIAG:  Cervicalgia  Other muscle spasm  Rationale for Evaluation and Treatment: Rehabilitation  ONSET DATE: August 2023   SUBJECTIVE:  SUBJECTIVE STATEMENT: Reports feeling good since the weather has warmed up. Recently had an episode in which she feels locked up and finds relief when her back pops. Patient reports she and her family have been sick which caused absence with PT. Has MRI scheduled which is being pushed by neurologist.  PERTINENT HISTORY:  History of TIA, asthma, Chiari malformation, depression, smoker, PTSD, and allergies  PAIN:  Are you having pain? None reported  PRECAUTIONS: None  PATIENT GOALS: be able to make jewelry (be able to look down for prolonged periods) , improve her strength to care for her son, with reduced pain, and improved function with her daily activities  NEXT MD VISIT:  02/15/23  OBJECTIVE:   PATIENT SURVEYS:  FOTO    SENSATION: Patient reports numbness in her right hand radiating up to her shoulder. Diminished sensation to light touch along C6-7 dermatomes of the RUE   POSTURE: rounded shoulders, forward head, and increased thoracic kyphosis  PALPATION: Bilateral scapular stabilizers, upper trapezius, levator scapulae, and cervical paraspinals  JOINT MOBILITY :  Cervical: hypomobile throughout with C5-T1 reproducing her familiar sharp pain  Thoracic: hypomobile and nonpainful   CERVICAL ROM:   Active ROM A/PROM (deg) eval  Flexion 14; pulling and "razor being jabbing in my back"   Extension 38; pulling and "razor being jabbing in my back"  Right lateral flexion 30; pulling into shoulders   Left lateral flexion 28; pulling into shoulder  Right rotation 59; pulling  Left rotation 58; pulling into the right shoulder   (Blank rows = not tested)  UPPER EXTREMITY ROM: WFL for activities assessed  UPPER EXTREMITY MMT:  MMT Right eval Left eval  Shoulder flexion 3+/5; mid thoracic spine pain radiating up to B shoulders 4-/5; mid thoracic spine pain radiating up to B shoulders  Shoulder extension    Shoulder abduction    Shoulder adduction    Shoulder extension    Shoulder internal rotation    Shoulder external rotation    Middle trapezius    Lower trapezius    Elbow flexion    Elbow extension    Wrist flexion    Wrist extension    Wrist ulnar deviation    Wrist radial deviation    Wrist pronation    Wrist supination    Grip strength 20 20   (Blank rows = not tested)  CERVICAL SPECIAL TESTS:  Spurling's test: Positive, Distraction test: Positive, and Sharp pursor's test: Negative  TODAY'S TREATMENT:                                                                                                                              DATE: 03/29/23  Manual Therapy Soft Tissue Mobilization: B UT, cervical paraspinals, reduce tone and  pain  Modalities  Date: 03/29/23 Unattended Estim: Cervical, IFC, 15 mins, Tone Combo: Cervical, 1.5 w/cm2, 100%, 1 mhz, 10 mins, Tone  PATIENT EDUCATION:  Education details: POC, healing, goals for therapy,  and prognosis Person educated: Patient Education method: Explanation Education comprehension: verbalized understanding  HOME EXERCISE PROGRAM: Self STM with tennis ball as needed for pain  ASSESSMENT:  CLINICAL IMPRESSION: Patient presented in clinic with feeling good but having episodes of muscle tension. Patient did present with min- mod UT tone today but no complaints of tenderness with PT session today. Goal progress was unable to be assessed as patient's symptoms vary depending on activity or pain. Normal modalities response noted following removal of the modalities.  OBJECTIVE IMPAIRMENTS: decreased activity tolerance, decreased mobility, decreased ROM, decreased strength, dizziness, hypomobility, impaired flexibility, impaired sensation, impaired tone, impaired UE functional use, postural dysfunction, and pain.   ACTIVITY LIMITATIONS: carrying, lifting, bending, sleeping, reach over head, and caring for others  PARTICIPATION LIMITATIONS: meal prep, cleaning, laundry, driving, shopping, and community activity  PERSONAL FACTORS: Past/current experiences, Time since onset of injury/illness/exacerbation, and 3+ comorbidities: History of TIA, asthma, Chiari malformation, depression, smoker, PTSD, and allergies  are also affecting patient's functional outcome.   REHAB POTENTIAL: Good  CLINICAL DECISION MAKING: Evolving/moderate complexity  EVALUATION COMPLEXITY: Moderate  GOALS: Goals reviewed with patient? Yes  SHORT TERM GOALS: Target date: 01/23/23  Patient will be independent with her initial HEP. Baseline:  Goal status: Unable  2.  Patient will be able to complete her daily activities without her familiar pain exceeding 6/10. Baseline: Up to 9/10 Goal status:  Unable  3.  Patient will be able to demonstrate at least 60 degrees of cervical rotation bilaterally without being limited by her familiar symptoms for improved awareness while driving. Baseline:  Goal status: Unable  4.  Patient will be able to demonstrate improved grip strength bilaterally to at least 30 pounds for improved function carrying her groceries. Baseline:  Goal status: Unable  LONG TERM GOALS: Target date: 02/13/23  Patient will be independent with her advanced HEP. Baseline:  Goal status: Unable  2.  Patient will be able to complete her daily activities without her familiar pain exceeding 4/10. Baseline:  Goal status: Unable  3.  Patient will be able to demonstrate improved grip strength bilaterally to at least 30 pounds for improved function with her daily activities. Baseline:  Goal status: Unable  4.  Patient will be able to demonstrate at least 25 degrees of active cervical flexion for improved function making jewelry. Baseline:   Goal status: Unable  PLAN:  PT FREQUENCY: 2x/week  PT DURATION: other: 5 weeks  PLANNED INTERVENTIONS: Therapeutic exercises, Therapeutic activity, Neuromuscular re-education, Patient/Family education, Self Care, Joint mobilization, Joint manipulation, Dry Needling, Electrical stimulation, Spinal manipulation, Spinal mobilization, Cryotherapy, Moist heat, Traction, Ultrasound, Manual therapy, and Re-evaluation  PLAN FOR NEXT SESSION: isometrics, chin tucks, manual therapy, and modalities as needed  Marvell Fuller, PTA 03/29/2023, 12:12 PM

## 2023-04-05 ENCOUNTER — Encounter: Payer: 59 | Admitting: Physical Therapy

## 2023-04-10 ENCOUNTER — Ambulatory Visit: Payer: 59 | Admitting: Obstetrics and Gynecology

## 2023-04-19 ENCOUNTER — Ambulatory Visit
Admission: RE | Admit: 2023-04-19 | Discharge: 2023-04-19 | Disposition: A | Discharge status: Home / Self Care | Payer: 59 | Source: Ambulatory Visit | Attending: Neurology | Admitting: Neurology

## 2023-04-19 DIAGNOSIS — M542 Cervicalgia: Secondary | ICD-10-CM | POA: Diagnosis not present

## 2023-04-19 DIAGNOSIS — G43909 Migraine, unspecified, not intractable, without status migrainosus: Secondary | ICD-10-CM | POA: Diagnosis not present

## 2023-04-19 DIAGNOSIS — G935 Compression of brain: Secondary | ICD-10-CM

## 2023-04-24 ENCOUNTER — Ambulatory Visit (INDEPENDENT_AMBULATORY_CARE_PROVIDER_SITE_OTHER): Payer: 59 | Admitting: Obstetrics and Gynecology

## 2023-04-24 ENCOUNTER — Encounter: Payer: Self-pay | Admitting: Obstetrics and Gynecology

## 2023-04-24 VITALS — BP 111/76 | HR 83 | Ht 64.0 in

## 2023-04-24 DIAGNOSIS — N939 Abnormal uterine and vaginal bleeding, unspecified: Secondary | ICD-10-CM | POA: Diagnosis not present

## 2023-04-24 MED ORDER — TRANEXAMIC ACID 650 MG PO TABS
1300.0000 mg | ORAL_TABLET | Freq: Three times a day (TID) | ORAL | 2 refills | Status: DC
Start: 2023-04-24 — End: 2023-09-17

## 2023-04-24 NOTE — Progress Notes (Signed)
GYNECOLOGY VISIT  Patient name: Mary [name] MRN [id number]  Date of birth: [birth year] Chief Complaint:   Follow-up and Menstrual Problem   History:  Mary [name] is a 37 y.o. [id number] being seen today for continuous menstrual bleeding.  Has had heavy bleeding since 2010 after gettting depo. Had issue with . Bleeding has not stopped since December - may have had 10 dayss without bleeding. BF states he can't feel it but may have sensation of pinching inside the vagina while walking . Hasn't felt the strings herself Would really want to keep it in ideally as she does not want any more children but does not want to continue to have heavy bleeding - would be open to exchange if warranted.  Past Medical History:  Diagnosis Date   Asthma    Atypical squamous cell changes of undetermined significance (ASCUS) on cervical cytology with positive high risk human papilloma virus (HPV) 07/08/2013   Overview:   ASCUS with positive high risk HPV 07/08/13  Mild to moderate dysplasia on colpo 02/10/14   Plan for repeat colpo in 6 months (Due 08/13/14) schedulers flagged.  ASCUS with positive high risk HPV 07/08/13  Mild to moderate dysplasia on colpo 02/10/14   Plan for repeat colpo in 6 months (Due 08/13/14) schedulers flagged.   Bronchitis 01/31/2016   Chiari malformation type I ([facility])    Depression    GERD (gastroesophageal reflux disease)    H/O multiple concussions    Headache(784.0)    History of palpitations    Hx of fracture of nose    Mental disorder    Multiple fractures    Hx: of a leg and arm fracture as a child   Pneumonia    PONV (postoperative nausea and vomiting)    Post traumatic stress disorder (PTSD)    Pregnant 01/21/2016   PTSD (post-traumatic stress disorder)    Sinus infection 01/31/2016   Supervision of normal pregnancy, antepartum 02/08/2016    [facility]             13+1 week FOB   [name] 37 yo WM Dating By  LMP and [place] Pap  02/08/16  GC/CT Initial:                36+wks: Genetic Screen NT/IT:  CF screen  Anatomic [place]  Flu vaccine  Tdap Recommended ~ 28wks Glucose Screen  2 hr GBS  Feed Preference  Contraception  Circumcision  Childbirth Classes  Pediatrician      Past Surgical History:  Procedure Laterality Date   botox injections     colposcopy     DILATION AND CURETTAGE OF UTERUS     nexplanon     SUBOCCIPITAL CRANIECTOMY CERVICAL LAMINECTOMY N/A 12/16/2018   Procedure: Cervical one Laminectomy/Chiari decompression;  Surgeon: Donalee Citrin, MD;  Location: [facility] OR;  Service: Neurosurgery;  Laterality: N/A;   WISDOM TOOTH EXTRACTION      The following portions of the patient's history were reviewed and updated as appropriate: allergies, current medications, past family history, past medical history, past social history, past surgical history and problem list.   Health Maintenance:   Last pap     Component Value Date/Time   DIAGPAP  01/04/2023 1149    - Negative for intraepithelial lesion or malignancy (NILM)   DIAGPAP  03/31/2020 1128    - Negative for intraepithelial lesion or malignancy (NILM)   HPVHIGH Negative 01/04/2023 1149   HPVHIGH Negative 03/31/2020 1128  ADEQPAP  01/04/2023 1149    Satisfactory for evaluation; transformation zone component PRESENT.   ADEQPAP  03/31/2020 1128    Satisfactory for evaluation; transformation zone component ABSENT.    High Risk HPV: Positive  Adequacy:  Satisfactory for evaluation, transformation zone component PRESENT  Diagnosis:  Atypical squamous cells of undetermined significance (ASC-US)  Last mammogram: n/a   Review of Systems:  Pertinent items are noted in HPI. Comprehensive review of systems was otherwise negative.   Objective:  Physical Exam BP 111/76   Pulse 83   Ht 5\' 4"  (1.626 m)   BMI 27.29 kg/m    Physical Exam Vitals and nursing note reviewed. Exam conducted with a chaperone present.  Constitutional:      Appearance: Normal appearance.  HENT:      Head: Normocephalic and atraumatic.  Pulmonary:     Effort: Pulmonary effort is normal.     Breath sounds: Normal breath sounds.  Genitourinary:    General: Normal vulva.     Exam position: Lithotomy position.     Vagina: Normal.     Cervix: Normal.     Comments: Very long IUD strings - reaching to introitus (strings cut to ~3cm) IUD not visualized Skin:    General: Skin is warm and dry.  Neurological:     General: No focal deficit present.     Mental Status: She is alert.  Psychiatric:        Mood and Affect: Mood normal.        Behavior: Behavior normal.        Thought Content: Thought content normal.        Judgment: Judgment normal.         Assessment & Plan:   1. Abnormal uterine bleeding (AUB) Trial of Lysteda to help with menstrual flow at this time.  Recommend pelvic ultrasound to assess position of IUD though IUD not seen protruding from the cervix, noted to have very long strings and unsure if strings were just long or if IUD has descended into the cervical canal.  Noted that if IUD is out of place, would recommend removal if patient would be open to IUD replacement at that time.  Also recommend CBC given report of prolonged vaginal bleeding during over the last 6 months. - US PELVIC COMPLETE WITH TRANSVAGINAL; Future - CBC; Future - tranexamic acid (LYSTEDA) 650 MG TABS tablet; Take 2 tablets (1,300 mg total) by mouth 3 (three) times daily. Take during menses for a maximum of five days  Dispense: 30 tablet; Refill: 2   Routine preventative health maintenance measures emphasized.  Lorriane Shire, MD Minimally Invasive Gynecologic Surgery [facility], [facility]

## 2023-04-24 NOTE — Progress Notes (Signed)
Pt reports some pinching, heavy periods.Also had a clot a size of golf ball a week and half ago.

## 2023-04-24 NOTE — Patient Instructions (Addendum)
I am sending a medication that is actually non-hormonal that you can take to help stop the bleeding. You take it 3 times a day for up to 5 days. You can stop the medication once the bleeding stops. We will see what your ultrasound shows.

## 2023-04-26 ENCOUNTER — Other Ambulatory Visit: Payer: 59

## 2023-04-26 ENCOUNTER — Telehealth: Payer: Self-pay

## 2023-04-26 DIAGNOSIS — N939 Abnormal uterine and vaginal bleeding, unspecified: Secondary | ICD-10-CM

## 2023-04-26 LAB — CBC
Hematocrit: 37.1 % (ref 34.0–46.6)
Hemoglobin: 12.1 g/dL (ref 11.1–15.9)
MCH: 29 pg (ref 26.6–33.0)
MCHC: 32.6 g/dL (ref 31.5–35.7)
MCV: 89 fL (ref 79–97)
Platelets: 286 10*3/uL (ref 150–450)
RBC: 4.17 x10E6/uL (ref 3.77–5.28)
RDW: 13.3 % (ref 11.7–15.4)
WBC: 7.4 10*3/uL (ref 3.4–10.8)

## 2023-04-26 NOTE — Telephone Encounter (Signed)
-----   Message from Drema Dallas, DO sent at 04/26/2023  7:23 AM EDT ----- MRI does not show anything concerning.  There is no residual Chiari malformation.  There are some tiny spots on the brain that are often seen in people with migraine.  I do not suspect anything serious.  The vertigo is likely related to migraine which we have begun treating

## 2023-04-26 NOTE — Telephone Encounter (Signed)
Patient advised of her MRI, but she has question of the Small vessel disease.   Please advised.
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
## 2023-05-01 NOTE — Telephone Encounter (Signed)
Per dr. Everlena Cooper, Nonspecific but often seen in people with migraine.

## 2023-05-02 ENCOUNTER — Other Ambulatory Visit: Payer: Self-pay | Admitting: Internal Medicine

## 2023-05-02 MED ORDER — ALPRAZOLAM 0.25 MG PO TABS: 0.2500 mg | ORAL_TABLET | Freq: Three times a day (TID) | ORAL | 0 refills | Status: DC | PRN

## 2023-05-02 NOTE — Telephone Encounter (Signed)
Per Dr.jaffe,  No

## 2023-05-03 ENCOUNTER — Ambulatory Visit (HOSPITAL_COMMUNITY)
Admission: RE | Admit: 2023-05-03 | Discharge: 2023-05-03 | Disposition: A | Discharge status: Home / Self Care | Payer: 59 | Source: Ambulatory Visit | Attending: Obstetrics and Gynecology | Admitting: Obstetrics and Gynecology

## 2023-05-03 DIAGNOSIS — N939 Abnormal uterine and vaginal bleeding, unspecified: Secondary | ICD-10-CM | POA: Diagnosis present

## 2023-05-03 DIAGNOSIS — D500 Iron deficiency anemia secondary to blood loss (chronic): Secondary | ICD-10-CM | POA: Diagnosis not present

## 2023-05-03 DIAGNOSIS — E039 Hypothyroidism, unspecified: Secondary | ICD-10-CM | POA: Diagnosis not present

## 2023-05-03 DIAGNOSIS — E1169 Type 2 diabetes mellitus with other specified complication: Secondary | ICD-10-CM | POA: Diagnosis not present

## 2023-05-08 ENCOUNTER — Encounter: Payer: Self-pay | Admitting: Obstetrics and Gynecology

## 2023-05-09 ENCOUNTER — Encounter: Payer: Self-pay | Admitting: Neurology

## 2023-05-09 ENCOUNTER — Other Ambulatory Visit: Payer: Self-pay | Admitting: Neurology

## 2023-05-09 MED ORDER — UBRELVY 100 MG PO TABS: 1.0000 | ORAL_TABLET | ORAL | 11 refills | Status: DC | PRN

## 2023-05-10 ENCOUNTER — Telehealth: Payer: Self-pay | Admitting: Pharmacy Technician

## 2023-05-10 NOTE — Telephone Encounter (Signed)
Patient Advocate Encounter   Received notification that prior authorization for Ubrelvy 100MG  tablets is required.   PA submitted on 05/10/2023 Key [id number] Insurance [employer] Medicare Part D Electronic Prior Authorization Form Status is pending

## 2023-05-15 ENCOUNTER — Telehealth: Payer: Self-pay | Admitting: Obstetrics and Gynecology

## 2023-05-15 NOTE — Telephone Encounter (Signed)
Called patient and confirmed ID x2.   Previously had heavy bleeding with depo. Had IUD placed since unable to have tubal ligation. Picked rx previously given. Recently had break in bleeding and  then when cycle returned seemed to be lighter. Wants to avoid procedure and general anesthesia if possible due to having repeat anesthesia exposure and having to coordinate childcare. Reviewed options of sonohystogram to evaluate cavity, EMB in clinic, or hysteroscopy. Would like to keep IUD if possible due to being done with childbearing. Will consider options and review at visit on 6/13  Lorriane Shire, MD, FACOG Minimally Invasive Gynecologic Surgery  Obstetrics and Gynecology, [facility] for [facility], [facility] 05/15/2023

## 2023-05-24 ENCOUNTER — Telehealth (INDEPENDENT_AMBULATORY_CARE_PROVIDER_SITE_OTHER): Payer: 59 | Admitting: Obstetrics and Gynecology

## 2023-05-24 ENCOUNTER — Encounter: Payer: Self-pay | Admitting: Obstetrics and Gynecology

## 2023-05-24 DIAGNOSIS — N939 Abnormal uterine and vaginal bleeding, unspecified: Secondary | ICD-10-CM | POA: Diagnosis not present

## 2023-05-24 NOTE — Progress Notes (Signed)
GYNECOLOGY VIRTUAL VISIT ENCOUNTER NOTE  Provider location: Center for [facility]   Patient location: Home  I connected with Mary [name] on 05/24/23 at  1:35 PM EDT by MyChart Video Encounter and verified that I am speaking with the correct person using two identifiers.   I discussed the limitations, risks, security and privacy concerns of performing an evaluation and management service virtually and the availability of in person appointments. I also discussed with the patient that there may be a patient responsible charge related to this service. The patient expressed understanding and agreed to proceed.   History:  Mary [name] is a 37 y.o. [phone] female being evaluated today for follow up with bleeding with IUD in place. Bleeding has been lighter more recently - didn't take medication initially prescribed Had been having cramping previously and breast tenderness previously and both have resolved The pinching sensation has resolved after cutting the strings Bleeding is much lighter at this time     Past Medical History:  Diagnosis Date   Asthma    Atypical squamous cell changes of undetermined significance (ASCUS) on cervical cytology with positive high risk human papilloma virus (HPV) 07/08/2013   Overview:   ASCUS with positive high risk HPV 07/08/13  Mild to moderate dysplasia on colpo 02/10/14   Plan for repeat colpo in 6 months (Due 08/13/14) schedulers flagged.  ASCUS with positive high risk HPV 07/08/13  Mild to moderate dysplasia on colpo 02/10/14   Plan for repeat colpo in 6 months (Due 08/13/14) schedulers flagged.   Bronchitis 01/31/2016   Chiari malformation type I (HCC)    Depression    GERD (gastroesophageal reflux disease)    H/O multiple concussions    Headache(784.0)    History of palpitations    Hx of fracture of nose    Mental disorder    Multiple fractures    Hx: of a leg and arm fracture as a child   Pneumonia    PONV  (postoperative nausea and vomiting)    Post traumatic stress disorder (PTSD)    Pregnant 01/21/2016   PTSD (post-traumatic stress disorder)    Sinus infection 01/31/2016   Supervision of normal pregnancy, antepartum 02/08/2016    [facility] at             13+1 week FOB   Mary [name] 37 yo WM Dating By  LMP and [place] Pap  02/08/16 GC/CT Initial:                36+wks: Genetic Screen NT/IT:  CF screen  Anatomic [place]  Flu vaccine  Tdap Recommended ~ 28wks Glucose Screen  2 hr GBS  Feed Preference  Contraception  Circumcision  Childbirth Classes  Pediatrician     Past Surgical History:  Procedure Laterality Date   botox injections     colposcopy     DILATION AND CURETTAGE OF UTERUS     nexplanon     SUBOCCIPITAL CRANIECTOMY CERVICAL LAMINECTOMY N/A 12/16/2018   Procedure: Cervical one Laminectomy/Chiari decompression;  Surgeon: Donalee Citrin, MD;  Location: [facility] OR;  Service: Neurosurgery;  Laterality: N/A;   WISDOM TOOTH EXTRACTION     The following portions of the patient's history were reviewed and updated as appropriate: allergies, current medications, past family history, past medical history, past social history, past surgical history and problem list.   Health Maintenance:      Component Value Date/Time   DIAGPAP  01/04/2023 1149    -  Negative for intraepithelial lesion or malignancy (NILM)   DIAGPAP  03/31/2020 1128    - Negative for intraepithelial lesion or malignancy (NILM)   HPVHIGH Negative 01/04/2023 1149   HPVHIGH Negative 03/31/2020 1128   ADEQPAP  01/04/2023 1149    Satisfactory for evaluation; transformation zone component PRESENT.   ADEQPAP  03/31/2020 1128    Satisfactory for evaluation; transformation zone component ABSENT.    High Risk HPV: Positive  Adequacy:  Satisfactory for evaluation, transformation zone component PRESENT  Diagnosis:  Atypical squamous cells of undetermined significance (ASC-US)   Review of Systems:  Pertinent items noted  in HPI and remainder of comprehensive ROS otherwise negative.  Physical Exam:   General:  Alert, oriented and cooperative. Patient appears to be in no acute distress.  Mental Status: Normal mood and affect. Normal behavior. Normal judgment and thought content.   Respiratory: Normal respiratory effort, no problems with respiration noted  Rest of physical exam deferred due to type of encounter  Labs and Imaging No results found for this or any previous visit (from the past 336 hour(s)). US PELVIC COMPLETE WITH TRANSVAGINAL  Result Date: 05/05/2023 CLINICAL DATA:  Evaluate IUD position. EXAM: TRANSABDOMINAL AND TRANSVAGINAL ULTRASOUND OF PELVIS TECHNIQUE: Both transabdominal and transvaginal ultrasound examinations of the pelvis were performed. Transabdominal technique was performed for global imaging of the pelvis including uterus, ovaries, adnexal regions, and pelvic cul-de-sac. It was necessary to proceed with endovaginal exam following the transabdominal exam to visualize the endometrium and ovaries. COMPARISON:  None Available. FINDINGS: Uterus Measurements: 10.1 x 6.1 x 7.4 cm = volume: 239 mL. No fibroids or other mass visualized. Endometrium Thickness: 30 mm. The endometrium is heterogeneous in appearance. The IUD is identified along the posterior aspect of the endometrium appropriately located in the endometrial canal. Right ovary Measurements: 3.5 x 2.1 x 2.5 cm = volume: 9.5 mL. Normal appearance/no adnexal mass. Left ovary Measurements: 2.8 x 1.4 x 2.4 cm = volume: 4.8 mL. Normal appearance/no adnexal mass. Other findings No abnormal free fluid. IMPRESSION: 1. The IUD is appropriately located in the endometrial canal. 2. The endometrium is markedly thickened and heterogeneous. Recommend follow-up ultrasound in 6-12 weeks to ensure resolution. If the finding does not resolve, recommend gynecologic consultation and possible tissue sampling. 3. No other abnormalities. These results will be called  to the ordering clinician or representative by the Radiologist Assistant, and communication documented in the PACS or [employer]. Electronically Signed   By: Gerome Sam III M.D.   On: 05/05/2023 12:07       Assessment and Plan:     1. Abnormal uterine bleeding (AUB) Pain has resolved and bleeding significantly reduced. Re-review of imaging and unclear if EMS truly thickened. After discussion of options, will plan for continued observation of bleeding and interval [place] in 6-12 weeks to reassess lining and IUD position.  - US PELVIC COMPLETE WITH TRANSVAGINAL; Future       I discussed the assessment and treatment plan with the patient. The patient was provided an opportunity to ask questions and all were answered. The patient agreed with the plan and demonstrated an understanding of the instructions.   The patient was advised to call back or seek an in-person evaluation/go to the ED if the symptoms worsen or if the condition fails to improve as anticipated.  I provided 5 minutes of face-to-face time during this encounter. 10 minutes spent on chart review and documentation.    Lorriane Shire, MD Center for [employer], [facility]  [facility] Group

## 2023-05-25 DIAGNOSIS — G43011 Migraine without aura, intractable, with status migrainosus: Secondary | ICD-10-CM | POA: Diagnosis not present

## 2023-06-07 ENCOUNTER — Other Ambulatory Visit: Payer: Self-pay | Admitting: Internal Medicine

## 2023-06-07 MED ORDER — ALPRAZOLAM 0.25 MG PO TABS: 0.2500 mg | ORAL_TABLET | Freq: Three times a day (TID) | ORAL | 0 refills | Status: DC | PRN

## 2023-06-19 NOTE — Telephone Encounter (Signed)
Pharmacy Patient Advocate Encounter  Received notification from [facility] that Prior Authorization for Ubrelvy 100MG  tablets  has been APPROVED from 06/08/2023 to 12/11/2023.Marland Kitchen  PA #/Case ID/Reference #: [id number]

## 2023-07-09 ENCOUNTER — Other Ambulatory Visit: Payer: Self-pay | Admitting: Adult Health

## 2023-07-09 MED ORDER — ALPRAZOLAM 0.25 MG PO TABS: 0.2500 mg | ORAL_TABLET | Freq: Three times a day (TID) | ORAL | 0 refills | Status: DC | PRN

## 2023-07-20 ENCOUNTER — Ambulatory Visit (HOSPITAL_COMMUNITY): Admission: RE | Admit: 2023-07-20 | Payer: 59 | Source: Ambulatory Visit

## 2023-07-20 DIAGNOSIS — R9389 Abnormal findings on diagnostic imaging of other specified body structures: Secondary | ICD-10-CM | POA: Diagnosis not present

## 2023-07-20 DIAGNOSIS — N939 Abnormal uterine and vaginal bleeding, unspecified: Secondary | ICD-10-CM | POA: Diagnosis not present

## 2023-07-23 DIAGNOSIS — G43B1 Ophthalmoplegic migraine, intractable: Secondary | ICD-10-CM | POA: Diagnosis not present
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
## 2023-08-01 ENCOUNTER — Encounter: Payer: Self-pay | Admitting: Neurology

## 2023-08-01 ENCOUNTER — Ambulatory Visit: Payer: 59 | Admitting: Neurology

## 2023-08-06 ENCOUNTER — Encounter: Payer: Self-pay | Admitting: Neurology
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
## 2023-08-09 ENCOUNTER — Other Ambulatory Visit: Payer: Self-pay | Admitting: Internal Medicine

## 2023-08-10 ENCOUNTER — Ambulatory Visit: Payer: 59 | Admitting: Neurology

## 2023-08-14 MED ORDER — ALPRAZOLAM 0.25 MG PO TABS: 0.2500 mg | ORAL_TABLET | Freq: Three times a day (TID) | ORAL | 0 refills | Status: DC | PRN
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
## 2023-08-16 ENCOUNTER — Telehealth: Payer: Self-pay | Admitting: Internal Medicine

## 2023-08-16 ENCOUNTER — Telehealth: Payer: Self-pay | Admitting: Neurology

## 2023-08-16 ENCOUNTER — Ambulatory Visit: Payer: 59 | Admitting: Neurology

## 2023-08-16 DIAGNOSIS — M549 Dorsalgia, unspecified: Secondary | ICD-10-CM

## 2023-08-16 NOTE — Telephone Encounter (Signed)
Patient needs a call back from Mary [name] regarding some issues she is having. She said she hurt her self somehow and needs advice

## 2023-08-16 NOTE — Telephone Encounter (Signed)
Per patient she is unsure what she did but every time she moves her back hurts.   Advised patient that she will be discussed with Dr.Jaffe for Referral for PT, But since he is out of the office please call PCP to see if a referral can be added by that office to PT.

## 2023-08-16 NOTE — Telephone Encounter (Signed)
Pt requesting referral to physical therapy for back issues/pain

## 2023-08-16 NOTE — Addendum Note (Signed)
Addended by: Kern Reap B on: 08/16/2023 02:40 PM   Modules accepted: Orders

## 2023-08-27 ENCOUNTER — Encounter: Payer: Self-pay | Admitting: Physical Therapy

## 2023-08-27 ENCOUNTER — Other Ambulatory Visit: Payer: Self-pay

## 2023-08-27 ENCOUNTER — Ambulatory Visit: Payer: 59 | Attending: Internal Medicine | Admitting: Physical Therapy

## 2023-08-27 DIAGNOSIS — R293 Abnormal posture: Secondary | ICD-10-CM | POA: Diagnosis not present

## 2023-08-27 DIAGNOSIS — M546 Pain in thoracic spine: Secondary | ICD-10-CM | POA: Insufficient documentation

## 2023-08-27 DIAGNOSIS — M6281 Muscle weakness (generalized): Secondary | ICD-10-CM | POA: Diagnosis not present

## 2023-08-27 DIAGNOSIS — M542 Cervicalgia: Secondary | ICD-10-CM | POA: Insufficient documentation

## 2023-08-27 DIAGNOSIS — M62838 Other muscle spasm: Secondary | ICD-10-CM | POA: Insufficient documentation

## 2023-08-27 DIAGNOSIS — M549 Dorsalgia, unspecified: Secondary | ICD-10-CM | POA: Insufficient documentation

## 2023-08-27 NOTE — Therapy (Signed)
OUTPATIENT PHYSICAL THERAPY THORACOLUMBAR EVALUATION   Patient Name: Mary [name] MRN: [id number] DOB:[birth year], 37 y.o., female Today's Date: 08/27/2023  END OF SESSION:  PT End of Session - 08/27/23 1248     Visit Number 1    Number of Visits 12    Date for PT Re-Evaluation 10/08/23    PT Start Time 1110    PT Stop Time 1138    PT Time Calculation (min) 28 min    Activity Tolerance Patient tolerated treatment well    Behavior During Therapy WFL for tasks assessed/performed             Past Medical History:  Diagnosis Date   Asthma    Atypical squamous cell changes of undetermined significance (ASCUS) on cervical cytology with positive high risk human papilloma virus (HPV) 07/08/2013   Overview:   ASCUS with positive high risk HPV 07/08/13  Mild to moderate dysplasia on colpo 02/10/14   Plan for repeat colpo in 6 months (Due 08/13/14) schedulers flagged.  ASCUS with positive high risk HPV 07/08/13  Mild to moderate dysplasia on colpo 02/10/14   Plan for repeat colpo in 6 months (Due 08/13/14) schedulers flagged.   Bronchitis 01/31/2016   Chiari malformation type I (HCC)    Depression    GERD (gastroesophageal reflux disease)    H/O multiple concussions    Headache(784.0)    History of palpitations    Hx of fracture of nose    Mental disorder    Multiple fractures    Hx: of a leg and arm fracture as a child   Pneumonia    PONV (postoperative nausea and vomiting)    Post traumatic stress disorder (PTSD)    Pregnant 01/21/2016   PTSD (post-traumatic stress disorder)    Sinus infection 01/31/2016   Supervision of normal pregnancy, antepartum 02/08/2016    [facility]             13+1 week FOB   [name] 37 yo WM Dating By  LMP and [place] Pap  02/08/16 GC/CT Initial:                36+wks: Genetic Screen NT/IT:  CF screen  Anatomic [place]  Flu vaccine  Tdap Recommended ~ 28wks Glucose Screen  2 hr GBS  Feed Preference  Contraception  Circumcision  Childbirth  Classes  Pediatrician     Past Surgical History:  Procedure Laterality Date   botox injections     colposcopy     DILATION AND CURETTAGE OF UTERUS     nexplanon     SUBOCCIPITAL CRANIECTOMY CERVICAL LAMINECTOMY N/A 12/16/2018   Procedure: Cervical one Laminectomy/Chiari decompression;  Surgeon: Donalee Citrin, MD;  Location: [facility] OR;  Service: Neurosurgery;  Laterality: N/A;   WISDOM TOOTH EXTRACTION     Patient Active Problem List   Diagnosis Date Noted   Atypical chest pain 09/25/2022   Migraine with aura and without status migrainosus 07/24/2022   History of posttraumatic stress disorder (PTSD) 04/25/2022   Vitamin D deficiency 09/11/2019   Hyperlipidemia 09/11/2019   Palpitations 07/30/2018   Chiari I malformation (HCC) 02/13/2018   Current smoker 04/20/2017   Asthma 06/04/2013   Depression 06/04/2013    REFERRING PROVIDER: Burnetta Sabin MD  REFERRING DIAG: Back pain.    Rationale for Evaluation and Treatment: Rehabilitation  THERAPY DIAG:  Pain in thoracic spine  ONSET DATE: Ongoing.  SUBJECTIVE:  SUBJECTIVE STATEMENT: The patient presents to the clinic with a CC of mid-back pain that has been ongoing over the last few years.  She rates the pain at a 5/10 today but states her pain will rise to severe levels with leaning over and lifting.  She states that she cannot hold her 73 month old child without a lot of pain.  Rest, muscle relaxer, Ibuprofen, heat and ice can help some to decrease her pain.Marland Kitchen  PERTINENT HISTORY:  Chair I malformation and subsequent surgery.  Please see above.  PAIN:  Are you having pain? Yes: NPRS scale: 5/10 Pain location: Mid-back. Pain description: Sharp, throbbing, stabbing. Aggravating factors: As above. Relieving factors: As above.  PRECAUTIONS:  Chair I malformation  FALLS:  Has patient fallen in last 6 months? No  LIVING ENVIRONMENT: Lives with: lives with their family Lives in: House/apartment Has following equipment at home: None  OCCUPATION: Disabled.  PLOF: Independent  PATIENT GOALS: Lift child     OBJECTIVE:    POSTURE: rounded shoulders and forward head, right shoulder depression.  PALPATION: Tender to palpation from T3 to T8 and increased pain on left.   UE EXTREMITY MMT:    Bilateral shoulder flexion 4 to 4+/5.  IR/ER is normal.  Bi/Tri strength is normal.   DTR's:   Left Bi 1+/4+, Brach 2+/4+ and Tri 1+/4+.  Right Bi is 1+/4+, Brach 2+/4+ and Tri 2+/4+.    GAIT: WNL.   PATIENT EDUCATION:    HOME EXERCISE PROGRAM:  ASSESSMENT:  CLINICAL IMPRESSION: The patient presents to OPPT with a CC of mid-back pain that has been ongoing over the last few years.  She was found to be tender to palpation from T3 to T8 (left > right).  Her pain rises to severe levels with lifting and leaning over.  She has great difficulty and a lot of pain carrying her 53 month old son.  She has some minimal weakness into bilateral shoulder flexion.  Patient will benefit from skilled physical therapy intervention to address pain and deficits.  OBJECTIVE IMPAIRMENTS: decreased activity tolerance, decreased strength, increased muscle spasms, postural dysfunction, and pain.   ACTIVITY LIMITATIONS: carrying, lifting, and bending  PARTICIPATION LIMITATIONS: cleaning and laundry  PERSONAL FACTORS: Time since onset of injury/illness/exacerbation are also affecting patient's functional outcome.   REHAB POTENTIAL: Good  CLINICAL DECISION MAKING: Stable/uncomplicated  EVALUATION COMPLEXITY: Low   GOALS:  LONG TERM GOALS: Target date: 10/08/23  Ind with a HEP.  Goal status: INITIAL  2.  Perform ADL's with pain not > 3/10.  Goal status: INITIAL  3.  Lift and carry child with pain not > 3-4/10.  Goal status:  INITIAL PLAN:  PT FREQUENCY: 2x/week  PT DURATION: 6 weeks  PLANNED INTERVENTIONS: Therapeutic exercises, Therapeutic activity, Patient/Family education, Self Care, Electrical stimulation, Cryotherapy, Moist heat, Ultrasound, and Manual therapy.  PLAN FOR NEXT SESSION: Modalities and STW/M as needed.  Postural exercises.   [name], [place], PT 08/27/2023, 12:52 PM
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
## 2023-08-31 ENCOUNTER — Ambulatory Visit: Payer: 59 | Admitting: Neurology

## 2023-08-31 ENCOUNTER — Ambulatory Visit: Payer: 59

## 2023-08-31 DIAGNOSIS — M62838 Other muscle spasm: Secondary | ICD-10-CM

## 2023-08-31 DIAGNOSIS — M546 Pain in thoracic spine: Secondary | ICD-10-CM

## 2023-08-31 DIAGNOSIS — M6281 Muscle weakness (generalized): Secondary | ICD-10-CM | POA: Diagnosis not present

## 2023-08-31 DIAGNOSIS — M542 Cervicalgia: Secondary | ICD-10-CM | POA: Diagnosis not present

## 2023-08-31 DIAGNOSIS — M549 Dorsalgia, unspecified: Secondary | ICD-10-CM | POA: Diagnosis not present

## 2023-08-31 DIAGNOSIS — R293 Abnormal posture: Secondary | ICD-10-CM | POA: Diagnosis not present

## 2023-08-31 NOTE — Therapy (Signed)
OUTPATIENT PHYSICAL THERAPY THORACOLUMBAR TREATMENT   Patient Name: Mary [name] MRN: [id number] DOB:1986/11/23, 37 y.o., female Today's Date: 08/31/2023  END OF SESSION:  PT End of Session - 08/31/23 1104     Visit Number 2    Number of Visits 12    Date for PT Re-Evaluation 10/08/23    PT Start Time 1100    PT Stop Time 1200    PT Time Calculation (min) 60 min    Activity Tolerance Patient tolerated treatment well    Behavior During Therapy WFL for tasks assessed/performed             Past Medical History:  Diagnosis Date   Asthma    Atypical squamous cell changes of undetermined significance (ASCUS) on cervical cytology with positive high risk human papilloma virus (HPV) 07/08/2013   Overview:   ASCUS with positive high risk HPV 07/08/13  Mild to moderate dysplasia on colpo 02/10/14   Plan for repeat colpo in 6 months (Due 08/13/14) schedulers flagged.  ASCUS with positive high risk HPV 07/08/13  Mild to moderate dysplasia on colpo 02/10/14   Plan for repeat colpo in 6 months (Due 08/13/14) schedulers flagged.   Bronchitis 01/31/2016   Chiari malformation type I (HCC)    Depression    GERD (gastroesophageal reflux disease)    H/O multiple concussions    Headache(784.0)    History of palpitations    Hx of fracture of nose    Mental disorder    Multiple fractures    Hx: of a leg and arm fracture as a child   Pneumonia    PONV (postoperative nausea and vomiting)    Post traumatic stress disorder (PTSD)    Pregnant 01/21/2016   PTSD (post-traumatic stress disorder)    Sinus infection 01/31/2016   Supervision of normal pregnancy, antepartum 02/08/2016    [facility]             13+1 week FOB   [name] 37 yo WM Dating By  LMP and [place] Pap  02/08/16 GC/CT Initial:                36+wks: Genetic Screen NT/IT:  CF screen  Anatomic [place]  Flu vaccine  Tdap Recommended ~ 28wks Glucose Screen  2 hr GBS  Feed Preference  Contraception  Circumcision  Childbirth  Classes  Pediatrician     Past Surgical History:  Procedure Laterality Date   botox injections     colposcopy     DILATION AND CURETTAGE OF UTERUS     nexplanon     SUBOCCIPITAL CRANIECTOMY CERVICAL LAMINECTOMY N/A 12/16/2018   Procedure: Cervical one Laminectomy/Chiari decompression;  Surgeon: Donalee Citrin, MD;  Location: [facility] OR;  Service: Neurosurgery;  Laterality: N/A;   WISDOM TOOTH EXTRACTION     Patient Active Problem List   Diagnosis Date Noted   Atypical chest pain 09/25/2022   Migraine with aura and without status migrainosus 07/24/2022   History of posttraumatic stress disorder (PTSD) 04/25/2022   Vitamin D deficiency 09/11/2019   Hyperlipidemia 09/11/2019   Palpitations 07/30/2018   Chiari I malformation (HCC) 02/13/2018   Current smoker 04/20/2017   Asthma 06/04/2013   Depression 06/04/2013    REFERRING PROVIDER: Burnetta Sabin MD  REFERRING DIAG: Back pain.    Rationale for Evaluation and Treatment: Rehabilitation  THERAPY DIAG:  Pain in thoracic spine  Cervicalgia  Other muscle spasm  ONSET DATE: Ongoing.  SUBJECTIVE:  SUBJECTIVE STATEMENT: Pt reports 4/10 mid-back pain today  PERTINENT HISTORY:  Chair I malformation and subsequent surgery.  Please see above.  PAIN:  Are you having pain? Yes: NPRS scale: 4/10 Pain location: Mid-back. Pain description: Sharp, throbbing, stabbing. Aggravating factors: As above. Relieving factors: As above.  PRECAUTIONS: Chair I malformation  FALLS:  Has patient fallen in last 6 months? No  LIVING ENVIRONMENT: Lives with: lives with their family Lives in: House/apartment Has following equipment at home: None  OCCUPATION: Disabled.  PLOF: Independent  PATIENT GOALS: Lift child   OBJECTIVE:   POSTURE: rounded  shoulders and forward head, right shoulder depression.  PALPATION: Tender to palpation from T3 to T8 and increased pain on left.  UE EXTREMITY MMT:    Bilateral shoulder flexion 4 to 4+/5.  IR/ER is normal.  Bi/Tri strength is normal. DTR's:   Left Bi 1+/4+, Brach 2+/4+ and Tri 1+/4+.  Right Bi is 1+/4+, Brach 2+/4+ and Tri 2+/4+.  GAIT: WNL.  TODAY'S TREATMENT                                    EXERCISE LOG  Exercise Repetitions and Resistance Comments  Nustep Lvl 2 x 18 mins        Blank cell = exercise not performed today   Manual Therapy Soft Tissue Mobilization: cervical and thoracic spine, STW/M to cervical and thoracic paraspinals to decrease pain and tone    Modalities  Date:  Unattended Estim: thoracic spine, IFC 80-150 Hz, 15 mins, Pain and Tone Hot Pack: thoracic spine, 15 mins, Pain and Tone   PATIENT EDUCATION:    HOME EXERCISE PROGRAM:  ASSESSMENT:  CLINICAL IMPRESSION: Pt arrives for today's treatment session 4/10 cervical and thoracic spine pain. Pt able to tolerate Nustep for warm up today without complaint.  STW/M performed to cervical and thoracic paraspinals and rhomboids to decrease pain and tone.  Normal responses to estim and MH noted upon removal.  Pt denied any pain at completion of today's treatment session.   OBJECTIVE IMPAIRMENTS: decreased activity tolerance, decreased strength, increased muscle spasms, postural dysfunction, and pain.   ACTIVITY LIMITATIONS: carrying, lifting, and bending  PARTICIPATION LIMITATIONS: cleaning and laundry  PERSONAL FACTORS: Time since onset of injury/illness/exacerbation are also affecting patient's functional outcome.   REHAB POTENTIAL: Good  CLINICAL DECISION MAKING: Stable/uncomplicated  EVALUATION COMPLEXITY: Low   GOALS:  LONG TERM GOALS: Target date: 10/08/23  Ind with a HEP.  Goal status: INITIAL  2.  Perform ADL's with pain not > 3/10.  Goal status: INITIAL  3.  Lift and carry  child with pain not > 3-4/10.  Goal status: INITIAL PLAN:  PT FREQUENCY: 2x/week  PT DURATION: 6 weeks  PLANNED INTERVENTIONS: Therapeutic exercises, Therapeutic activity, Patient/Family education, Self Care, Electrical stimulation, Cryotherapy, Moist heat, Ultrasound, and Manual therapy.  PLAN FOR NEXT SESSION: Modalities and STW/M as needed.  Postural exercises.   Newman Pies, PTA 08/31/2023, 12:39 PM

## 2023-09-03 ENCOUNTER — Encounter: Payer: Self-pay | Admitting: Neurology

## 2023-09-04 ENCOUNTER — Ambulatory Visit: Payer: 59

## 2023-09-04 DIAGNOSIS — R293 Abnormal posture: Secondary | ICD-10-CM | POA: Diagnosis not present

## 2023-09-04 DIAGNOSIS — M6281 Muscle weakness (generalized): Secondary | ICD-10-CM | POA: Diagnosis not present

## 2023-09-04 DIAGNOSIS — M542 Cervicalgia: Secondary | ICD-10-CM | POA: Diagnosis not present

## 2023-09-04 DIAGNOSIS — M62838 Other muscle spasm: Secondary | ICD-10-CM | POA: Diagnosis not present

## 2023-09-04 DIAGNOSIS — M549 Dorsalgia, unspecified: Secondary | ICD-10-CM | POA: Diagnosis not present

## 2023-09-04 DIAGNOSIS — M546 Pain in thoracic spine: Secondary | ICD-10-CM

## 2023-09-04 NOTE — Therapy (Signed)
OUTPATIENT PHYSICAL THERAPY THORACOLUMBAR TREATMENT   Patient Name: Mary [name] MRN: [id number] DOB:[birth year], 37 y.o., female Today's Date: 09/04/2023  END OF SESSION:  PT End of Session - 09/04/23 1056     Visit Number 3    Number of Visits 12    Date for PT Re-Evaluation 10/08/23    PT Start Time 1015    PT Stop Time 1115    PT Time Calculation (min) 60 min    Activity Tolerance Patient tolerated treatment well    Behavior During Therapy WFL for tasks assessed/performed             Past Medical History:  Diagnosis Date   Asthma    Atypical squamous cell changes of undetermined significance (ASCUS) on cervical cytology with positive high risk human papilloma virus (HPV) 07/08/2013   Overview:   ASCUS with positive high risk HPV 07/08/13  Mild to moderate dysplasia on colpo 02/10/14   Plan for repeat colpo in 6 months (Due 08/13/14) schedulers flagged.  ASCUS with positive high risk HPV 07/08/13  Mild to moderate dysplasia on colpo 02/10/14   Plan for repeat colpo in 6 months (Due 08/13/14) schedulers flagged.   Bronchitis 01/31/2016   Chiari malformation type I (HCC)    Depression    GERD (gastroesophageal reflux disease)    H/O multiple concussions    Headache(784.0)    History of palpitations    Hx of fracture of nose    Mental disorder    Multiple fractures    Hx: of a leg and arm fracture as a child   Pneumonia    PONV (postoperative nausea and vomiting)    Post traumatic stress disorder (PTSD)    Pregnant 01/21/2016   PTSD (post-traumatic stress disorder)    Sinus infection 01/31/2016   Supervision of normal pregnancy, antepartum 02/08/2016    [facility] at             13+1 week FOB   [name] 37 yo WM Dating By  LMP and [place] Pap  02/08/16 GC/CT Initial:                36+wks: Genetic Screen NT/IT:  CF screen  Anatomic [place]  Flu vaccine  Tdap Recommended ~ 28wks Glucose Screen  2 hr GBS  Feed Preference  Contraception  Circumcision  Childbirth  Classes  Pediatrician     Past Surgical History:  Procedure Laterality Date   botox injections     colposcopy     DILATION AND CURETTAGE OF UTERUS     nexplanon     SUBOCCIPITAL CRANIECTOMY CERVICAL LAMINECTOMY N/A 12/16/2018   Procedure: Cervical one Laminectomy/Chiari decompression;  Surgeon: Donalee Citrin, MD;  Location: [facility] OR;  Service: Neurosurgery;  Laterality: N/A;   WISDOM TOOTH EXTRACTION     Patient Active Problem List   Diagnosis Date Noted   Atypical chest pain 09/25/2022   Migraine with aura and without status migrainosus 07/24/2022   History of posttraumatic stress disorder (PTSD) 04/25/2022   Vitamin D deficiency 09/11/2019   Hyperlipidemia 09/11/2019   Palpitations 07/30/2018   Chiari I malformation (HCC) 02/13/2018   Current smoker 04/20/2017   Asthma 06/04/2013   Depression 06/04/2013    REFERRING PROVIDER: Burnetta Sabin MD  REFERRING DIAG: Back pain.    Rationale for Evaluation and Treatment: Rehabilitation  THERAPY DIAG:  Pain in thoracic spine  Cervicalgia  Other muscle spasm  Muscle weakness (generalized)  Abnormal posture  ONSET DATE: Ongoing.  SUBJECTIVE:                                                                                                                                                                                           SUBJECTIVE STATEMENT: Pt reports 6/10 mid-back pain today  PERTINENT HISTORY:  Chair I malformation and subsequent surgery.  Please see above.  PAIN:  Are you having pain? Yes: NPRS scale: 6/10 Pain location: Mid-back. Pain description: Sharp, throbbing, stabbing. Aggravating factors: As above. Relieving factors: As above.  PRECAUTIONS: Chair I malformation  FALLS:  Has patient fallen in last 6 months? No  LIVING ENVIRONMENT: Lives with: lives with their family Lives in: House/apartment Has following equipment at home: None  OCCUPATION: Disabled.  PLOF: Independent  PATIENT GOALS:  Lift child   OBJECTIVE:   POSTURE: rounded shoulders and forward head, right shoulder depression.  PALPATION: Tender to palpation from T3 to T8 and increased pain on left.  UE EXTREMITY MMT:    Bilateral shoulder flexion 4 to 4+/5.  IR/ER is normal.  Bi/Tri strength is normal. DTR's:   Left Bi 1+/4+, Brach 2+/4+ and Tri 1+/4+.  Right Bi is 1+/4+, Brach 2+/4+ and Tri 2+/4+.  GAIT: WNL.  TODAY'S TREATMENT                                    EXERCISE LOG  Exercise Repetitions and Resistance Comments  Nustep Lvl 3 x 15 mins        Blank cell = exercise not performed today   Manual Therapy Soft Tissue Mobilization: cervical and thoracic spine, STW/M to cervical and thoracic paraspinals to decrease pain and tone    Modalities  Date:  Unattended Estim: thoracic spine, IFC 80-150 Hz, 15 mins, Pain and Tone Combo: Thoracic spine, 1.5 w/cm2 and 100%, 12 mins, Pain Hot Pack: thoracic spine, 15 mins, Pain and Tone   PATIENT EDUCATION:    HOME EXERCISE PROGRAM:  ASSESSMENT:  CLINICAL IMPRESSION: Pt arrives for today's treatment session reporting 6/10 thoracic back pain.  Pt reports that she slept very good the night after her last treatment session.  STW/M performed to bil cervical and thoracic paraspinals as well as rhomboids to decrease pain and tone.  Normal responses noted with all modalities performed today.  Pt reported 4/10 thoracic spine pain at completion of today's treatment session.  OBJECTIVE IMPAIRMENTS: decreased activity tolerance, decreased strength, increased muscle spasms, postural dysfunction, and pain.   ACTIVITY LIMITATIONS: carrying, lifting, and bending  PARTICIPATION LIMITATIONS: cleaning and laundry  PERSONAL FACTORS: Time since onset of  injury/illness/exacerbation are also affecting patient's functional outcome.   REHAB POTENTIAL: Good  CLINICAL DECISION MAKING: Stable/uncomplicated  EVALUATION COMPLEXITY: Low   GOALS:  LONG TERM GOALS:  Target date: 10/08/23  Ind with a HEP.  Goal status: INITIAL  2.  Perform ADL's with pain not > 3/10.  Goal status: INITIAL  3.  Lift and carry child with pain not > 3-4/10.  Goal status: INITIAL PLAN:  PT FREQUENCY: 2x/week  PT DURATION: 6 weeks  PLANNED INTERVENTIONS: Therapeutic exercises, Therapeutic activity, Patient/Family education, Self Care, Electrical stimulation, Cryotherapy, Moist heat, Ultrasound, and Manual therapy.  PLAN FOR NEXT SESSION: Modalities and STW/M as needed.  Postural exercises.   Newman Pies, PTA 09/04/2023, 12:16 PM

## 2023-09-06 ENCOUNTER — Ambulatory Visit: Payer: 59 | Admitting: *Deleted

## 2023-09-06 DIAGNOSIS — M546 Pain in thoracic spine: Secondary | ICD-10-CM

## 2023-09-06 DIAGNOSIS — M549 Dorsalgia, unspecified: Secondary | ICD-10-CM | POA: Diagnosis not present

## 2023-09-06 DIAGNOSIS — M62838 Other muscle spasm: Secondary | ICD-10-CM | POA: Diagnosis not present

## 2023-09-06 DIAGNOSIS — M542 Cervicalgia: Secondary | ICD-10-CM

## 2023-09-06 DIAGNOSIS — M6281 Muscle weakness (generalized): Secondary | ICD-10-CM

## 2023-09-06 DIAGNOSIS — R293 Abnormal posture: Secondary | ICD-10-CM

## 2023-09-06 NOTE — Therapy (Signed)
OUTPATIENT PHYSICAL THERAPY THORACOLUMBAR TREATMENT   Patient Name: Mary [name] MRN: [id number] DOB:01-Jun-1986, 37 y.o., female Today's Date: 09/06/2023  END OF SESSION:  PT End of Session - 09/06/23 0937     Visit Number 4    Number of Visits 12    Date for PT Re-Evaluation 10/08/23    PT Start Time 0930    PT Stop Time 1027    PT Time Calculation (min) 57 min    Activity Tolerance Patient tolerated treatment well    Behavior During Therapy WFL for tasks assessed/performed             Past Medical History:  Diagnosis Date   Asthma    Atypical squamous cell changes of undetermined significance (ASCUS) on cervical cytology with positive high risk human papilloma virus (HPV) 07/08/2013   Overview:   ASCUS with positive high risk HPV 07/08/13  Mild to moderate dysplasia on colpo 02/10/14   Plan for repeat colpo in 6 months (Due 08/13/14) schedulers flagged.  ASCUS with positive high risk HPV 07/08/13  Mild to moderate dysplasia on colpo 02/10/14   Plan for repeat colpo in 6 months (Due 08/13/14) schedulers flagged.   Bronchitis 01/31/2016   Chiari malformation type I (HCC)    Depression    GERD (gastroesophageal reflux disease)    H/O multiple concussions    Headache(784.0)    History of palpitations    Hx of fracture of nose    Mental disorder    Multiple fractures    Hx: of a leg and arm fracture as a child   Pneumonia    PONV (postoperative nausea and vomiting)    Post traumatic stress disorder (PTSD)    Pregnant 01/21/2016   PTSD (post-traumatic stress disorder)    Sinus infection 01/31/2016   Supervision of normal pregnancy, antepartum 02/08/2016    [facility] at             13+1 week FOB   [name] 37 yo WM Dating By  LMP and [place] Pap  02/08/16 GC/CT Initial:                36+wks: Genetic Screen NT/IT:  CF screen  Anatomic [place]  Flu vaccine  Tdap Recommended ~ 28wks Glucose Screen  2 hr GBS  Feed Preference  Contraception  Circumcision  Childbirth  Classes  Pediatrician     Past Surgical History:  Procedure Laterality Date   botox injections     colposcopy     DILATION AND CURETTAGE OF UTERUS     nexplanon     SUBOCCIPITAL CRANIECTOMY CERVICAL LAMINECTOMY N/A 12/16/2018   Procedure: Cervical one Laminectomy/Chiari decompression;  Surgeon: Donalee Citrin, MD;  Location: [facility] OR;  Service: Neurosurgery;  Laterality: N/A;   WISDOM TOOTH EXTRACTION     Patient Active Problem List   Diagnosis Date Noted   Atypical chest pain 09/25/2022   Migraine with aura and without status migrainosus 07/24/2022   History of posttraumatic stress disorder (PTSD) 04/25/2022   Vitamin D deficiency 09/11/2019   Hyperlipidemia 09/11/2019   Palpitations 07/30/2018   Chiari I malformation (HCC) 02/13/2018   Current smoker 04/20/2017   Asthma 06/04/2013   Depression 06/04/2013    REFERRING PROVIDER: Burnetta Sabin MD  REFERRING DIAG: Back pain.    Rationale for Evaluation and Treatment: Rehabilitation  THERAPY DIAG:  Pain in thoracic spine  Cervicalgia  Other muscle spasm  Muscle weakness (generalized)  Abnormal posture  ONSET DATE: Ongoing.  SUBJECTIVE:                                                                                                                                                                                           SUBJECTIVE STATEMENT: Pt reports 6-7/10 mid-back pain today  PERTINENT HISTORY:  Chair I malformation and subsequent surgery.  Please see above.  PAIN:  Are you having pain? Yes: NPRS scale: 6-7/10 Pain location: Mid-back. Pain description: Sharp, throbbing, stabbing. Aggravating factors: As above. Relieving factors: As above.  PRECAUTIONS: Chair I malformation  FALLS:  Has patient fallen in last 6 months? No  LIVING ENVIRONMENT: Lives with: lives with their family Lives in: House/apartment Has following equipment at home: None  OCCUPATION: Disabled.  PLOF: Independent  PATIENT  GOALS: Lift child   OBJECTIVE:   POSTURE: rounded shoulders and forward head, right shoulder depression.  PALPATION: Tender to palpation from T3 to T8 and increased pain on left.  UE EXTREMITY MMT:    Bilateral shoulder flexion 4 to 4+/5.  IR/ER is normal.  Bi/Tri strength is normal. DTR's:   Left Bi 1+/4+, Brach 2+/4+ and Tri 1+/4+.  Right Bi is 1+/4+, Brach 2+/4+ and Tri 2+/4+.  GAIT: WNL.  TODAY'S TREATMENT                                    EXERCISE LOG  Exercise Repetitions and Resistance Comments  Recumbent Bike Lvl 3 x 15 mins        Blank cell = exercise not performed today   Manual Therapy Soft Tissue Mobilization: cervical and thoracic spine, STW/M to cervical and thoracic paraspinals to decrease pain and tone    Modalities  Date:  Unattended Estim: thoracic spine, IFC 80-150 Hz, 15 mins, Pain and Tone Combo: Thoracic spine, 1.5 w/cm2 and 100%, 12 mins, Pain Hot Pack: thoracic spine, 15 mins, Pain and Tone   PATIENT EDUCATION:    HOME EXERCISE PROGRAM:  ASSESSMENT:  CLINICAL IMPRESSION: Pt arrives for today's treatment session reporting 6-7 thoracic spine pain.  Pt able to tolerate transition to recumbent bike today without issue or complaint of pain.  STW/M performed to thoracic paraspinals and rhomboids to decrease pain and tone.  Normal responses to all modalities noted upon removal today.  Pt reported 4/10 back pain at completion of today's treatment session.   OBJECTIVE IMPAIRMENTS: decreased activity tolerance, decreased strength, increased muscle spasms, postural dysfunction, and pain.   ACTIVITY LIMITATIONS: carrying, lifting, and bending  PARTICIPATION LIMITATIONS: cleaning and laundry  PERSONAL FACTORS: Time since onset of injury/illness/exacerbation are  also affecting patient's functional outcome.   REHAB POTENTIAL: Good  CLINICAL DECISION MAKING: Stable/uncomplicated  EVALUATION COMPLEXITY: Low   GOALS:  LONG TERM GOALS: Target  date: 10/08/23  Ind with a HEP.  Goal status: INITIAL  2.  Perform ADL's with pain not > 3/10.  Goal status: INITIAL  3.  Lift and carry child with pain not > 3-4/10.  Goal status: INITIAL PLAN:  PT FREQUENCY: 2x/week  PT DURATION: 6 weeks  PLANNED INTERVENTIONS: Therapeutic exercises, Therapeutic activity, Patient/Family education, Self Care, Electrical stimulation, Cryotherapy, Moist heat, Ultrasound, and Manual therapy.  PLAN FOR NEXT SESSION: Modalities and STW/M as needed.  Postural exercises.   Newman Pies, PTA 09/06/2023, 11:08 AM

## 2023-09-11 ENCOUNTER — Ambulatory Visit: Payer: 59 | Attending: Internal Medicine

## 2023-09-11 DIAGNOSIS — M546 Pain in thoracic spine: Secondary | ICD-10-CM | POA: Insufficient documentation

## 2023-09-11 DIAGNOSIS — R2681 Unsteadiness on feet: Secondary | ICD-10-CM | POA: Diagnosis not present

## 2023-09-11 DIAGNOSIS — R293 Abnormal posture: Secondary | ICD-10-CM | POA: Diagnosis not present

## 2023-09-11 DIAGNOSIS — M62838 Other muscle spasm: Secondary | ICD-10-CM | POA: Diagnosis not present

## 2023-09-11 DIAGNOSIS — M542 Cervicalgia: Secondary | ICD-10-CM | POA: Insufficient documentation

## 2023-09-11 DIAGNOSIS — M6281 Muscle weakness (generalized): Secondary | ICD-10-CM | POA: Insufficient documentation

## 2023-09-11 DIAGNOSIS — R42 Dizziness and giddiness: Secondary | ICD-10-CM | POA: Diagnosis not present

## 2023-09-11 NOTE — Therapy (Signed)
OUTPATIENT PHYSICAL THERAPY THORACOLUMBAR TREATMENT   Patient Name: Mary [name] MRN: [id number] DOB:December 28, 1985, 37 y.o., female Today's Date: 09/11/2023  END OF SESSION:  PT End of Session - 09/11/23 1031     Visit Number 5    Number of Visits 12    Date for PT Re-Evaluation 10/08/23    PT Start Time 1015    PT Stop Time 1115    PT Time Calculation (min) 60 min    Activity Tolerance Patient tolerated treatment well    Behavior During Therapy WFL for tasks assessed/performed             Past Medical History:  Diagnosis Date   Asthma    Atypical squamous cell changes of undetermined significance (ASCUS) on cervical cytology with positive high risk human papilloma virus (HPV) 07/08/2013   Overview:   ASCUS with positive high risk HPV 07/08/13  Mild to moderate dysplasia on colpo 02/10/14   Plan for repeat colpo in 6 months (Due 08/13/14) schedulers flagged.  ASCUS with positive high risk HPV 07/08/13  Mild to moderate dysplasia on colpo 02/10/14   Plan for repeat colpo in 6 months (Due 08/13/14) schedulers flagged.   Bronchitis 01/31/2016   Chiari malformation type I (HCC)    Depression    GERD (gastroesophageal reflux disease)    H/O multiple concussions    Headache(784.0)    History of palpitations    Hx of fracture of nose    Mental disorder    Multiple fractures    Hx: of a leg and arm fracture as a child   Pneumonia    PONV (postoperative nausea and vomiting)    Post traumatic stress disorder (PTSD)    Pregnant 01/21/2016   PTSD (post-traumatic stress disorder)    Sinus infection 01/31/2016   Supervision of normal pregnancy, antepartum 02/08/2016    [facility] at             13+1 week FOB   [name] 37 yo WM Dating By  LMP and [place] Pap  02/08/16 GC/CT Initial:                36+wks: Genetic Screen NT/IT:  CF screen  Anatomic [place]  Flu vaccine  Tdap Recommended ~ 28wks Glucose Screen  2 hr GBS  Feed Preference  Contraception  Circumcision  Childbirth  Classes  Pediatrician     Past Surgical History:  Procedure Laterality Date   botox injections     colposcopy     DILATION AND CURETTAGE OF UTERUS     nexplanon     SUBOCCIPITAL CRANIECTOMY CERVICAL LAMINECTOMY N/A 12/16/2018   Procedure: Cervical one Laminectomy/Chiari decompression;  Surgeon: Donalee Citrin, MD;  Location: [facility] OR;  Service: Neurosurgery;  Laterality: N/A;   WISDOM TOOTH EXTRACTION     Patient Active Problem List   Diagnosis Date Noted   Atypical chest pain 09/25/2022   Migraine with aura and without status migrainosus 07/24/2022   History of posttraumatic stress disorder (PTSD) 04/25/2022   Vitamin D deficiency 09/11/2019   Hyperlipidemia 09/11/2019   Palpitations 07/30/2018   Chiari I malformation (HCC) 02/13/2018   Current smoker 04/20/2017   Asthma 06/04/2013   Depression 06/04/2013    REFERRING PROVIDER: Burnetta Sabin MD  REFERRING DIAG: Back pain.    Rationale for Evaluation and Treatment: Rehabilitation  THERAPY DIAG:  Pain in thoracic spine  Cervicalgia  Other muscle spasm  Muscle weakness (generalized)  Abnormal posture  ONSET DATE: Ongoing.  SUBJECTIVE:                                                                                                                                                                                           SUBJECTIVE STATEMENT: Pt reports 5/10 mid-back pain today  PERTINENT HISTORY:  Chair I malformation and subsequent surgery.  Please see above.  PAIN:  Are you having pain? Yes: NPRS scale: 5/10 Pain location: Mid-back. Pain description: Sharp, throbbing, stabbing. Aggravating factors: As above. Relieving factors: As above.  PRECAUTIONS: Chair I malformation  FALLS:  Has patient fallen in last 6 months? No  LIVING ENVIRONMENT: Lives with: lives with their family Lives in: House/apartment Has following equipment at home: None  OCCUPATION: Disabled.  PLOF: Independent  PATIENT GOALS:  Lift child   OBJECTIVE:   POSTURE: rounded shoulders and forward head, right shoulder depression.  PALPATION: Tender to palpation from T3 to T8 and increased pain on left.  UE EXTREMITY MMT:    Bilateral shoulder flexion 4 to 4+/5.  IR/ER is normal.  Bi/Tri strength is normal. DTR's:   Left Bi 1+/4+, Brach 2+/4+ and Tri 1+/4+.  Right Bi is 1+/4+, Brach 2+/4+ and Tri 2+/4+.  GAIT: WNL.  TODAY'S TREATMENT                                    EXERCISE LOG  Exercise Repetitions and Resistance Comments  Recumbent Bike Lvl 3 x 15 mins   Ball Rollout x3 mins   [employer] x3 mins    Blank cell = exercise not performed today   Manual Therapy Soft Tissue Mobilization: cervical and thoracic spine, STW/M to cervical and thoracic paraspinals to decrease pain and tone    Modalities  Date:  Unattended Estim: thoracic spine, IFC 80-150 Hz, 15 mins, Pain and Tone Hot Pack: thoracic spine, 15 mins, Pain and Tone   PATIENT EDUCATION:    HOME EXERCISE PROGRAM:  ASSESSMENT:  CLINICAL IMPRESSION: Pt arrives for today's treatment session reporting 5/10 thoracic spine pain. Pt introduced to standing physio-ball exercises today with good result.  Pt requiring cues for proper technique and hold time.  Pt interested in purchasing a physio-ball for home use.  STW/M performed to cervical and thoracic spine to decrease pain and tone.  Normal responses to estim and MH noted upon removal.  Pt reported 3/10 thoracic spine pain at completion of today's treatment session.  OBJECTIVE IMPAIRMENTS: decreased activity tolerance, decreased strength, increased muscle spasms, postural dysfunction, and pain.   ACTIVITY LIMITATIONS: carrying, lifting, and bending  PARTICIPATION LIMITATIONS:  cleaning and laundry  PERSONAL FACTORS: Time since onset of injury/illness/exacerbation are also affecting patient's functional outcome.   REHAB POTENTIAL: Good  CLINICAL DECISION MAKING:  Stable/uncomplicated  EVALUATION COMPLEXITY: Low   GOALS:  LONG TERM GOALS: Target date: 10/08/23  Ind with a HEP.  Goal status: IN PROGRESS  2.  Perform ADL's with pain not > 3/10.  Goal status: IN PROGRESS  3.  Lift and carry child with pain not > 3-4/10.  Goal status: IN PROGRESS PLAN:  PT FREQUENCY: 2x/week  PT DURATION: 6 weeks  PLANNED INTERVENTIONS: Therapeutic exercises, Therapeutic activity, Patient/Family education, Self Care, Electrical stimulation, Cryotherapy, Moist heat, Ultrasound, and Manual therapy.  PLAN FOR NEXT SESSION: Modalities and STW/M as needed.  Postural exercises.   Newman Pies, PTA 09/11/2023, 11:53 AM

## 2023-09-13 ENCOUNTER — Other Ambulatory Visit: Payer: Self-pay | Admitting: Internal Medicine

## 2023-09-13 ENCOUNTER — Encounter: Payer: Self-pay | Admitting: *Deleted

## 2023-09-13 ENCOUNTER — Encounter: Payer: Self-pay | Admitting: Internal Medicine

## 2023-09-13 ENCOUNTER — Ambulatory Visit: Payer: 59 | Admitting: *Deleted

## 2023-09-13 DIAGNOSIS — M6281 Muscle weakness (generalized): Secondary | ICD-10-CM | POA: Diagnosis not present

## 2023-09-13 DIAGNOSIS — M542 Cervicalgia: Secondary | ICD-10-CM

## 2023-09-13 DIAGNOSIS — R42 Dizziness and giddiness: Secondary | ICD-10-CM | POA: Diagnosis not present

## 2023-09-13 DIAGNOSIS — M62838 Other muscle spasm: Secondary | ICD-10-CM | POA: Diagnosis not present

## 2023-09-13 DIAGNOSIS — R293 Abnormal posture: Secondary | ICD-10-CM | POA: Diagnosis not present

## 2023-09-13 DIAGNOSIS — M546 Pain in thoracic spine: Secondary | ICD-10-CM | POA: Diagnosis not present

## 2023-09-13 DIAGNOSIS — R2681 Unsteadiness on feet: Secondary | ICD-10-CM | POA: Diagnosis not present

## 2023-09-13 MED ORDER — ALPRAZOLAM 0.25 MG PO TABS: 0.2500 mg | ORAL_TABLET | Freq: Three times a day (TID) | ORAL | 0 refills | Status: DC | PRN

## 2023-09-13 NOTE — Telephone Encounter (Signed)
Spoke with patient and an office visit was scheduled.  ?

## 2023-09-13 NOTE — Therapy (Signed)
OUTPATIENT PHYSICAL THERAPY THORACOLUMBAR TREATMENT   Patient Name: Mary [name] MRN: [id number] DOB:Feb 13, 1986, 37 y.o., female Today's Date: 09/13/2023  END OF SESSION:  PT End of Session - 09/13/23 1030     Visit Number 6    Number of Visits 12    Date for PT Re-Evaluation 10/08/23    PT Start Time 1021    PT Stop Time 1106    PT Time Calculation (min) 45 min             Past Medical History:  Diagnosis Date   Asthma    Atypical squamous cell changes of undetermined significance (ASCUS) on cervical cytology with positive high risk human papilloma virus (HPV) 07/08/2013   Overview:   ASCUS with positive high risk HPV 07/08/13  Mild to moderate dysplasia on colpo 02/10/14   Plan for repeat colpo in 6 months (Due 08/13/14) schedulers flagged.  ASCUS with positive high risk HPV 07/08/13  Mild to moderate dysplasia on colpo 02/10/14   Plan for repeat colpo in 6 months (Due 08/13/14) schedulers flagged.   Bronchitis 01/31/2016   Chiari malformation type I (HCC)    Depression    GERD (gastroesophageal reflux disease)    H/O multiple concussions    Headache(784.0)    History of palpitations    Hx of fracture of nose    Mental disorder    Multiple fractures    Hx: of a leg and arm fracture as a child   Pneumonia    PONV (postoperative nausea and vomiting)    Post traumatic stress disorder (PTSD)    Pregnant 01/21/2016   PTSD (post-traumatic stress disorder)    Sinus infection 01/31/2016   Supervision of normal pregnancy, antepartum 02/08/2016    [facility]             13+1 week FOB   [name] 37 yo WM Dating By  LMP and [place] Pap  02/08/16 GC/CT Initial:                36+wks: Genetic Screen NT/IT:  CF screen  Anatomic [place]  Flu vaccine  Tdap Recommended ~ 28wks Glucose Screen  2 hr GBS  Feed Preference  Contraception  Circumcision  Childbirth Classes  Pediatrician     Past Surgical History:  Procedure Laterality Date   botox injections     colposcopy      DILATION AND CURETTAGE OF UTERUS     nexplanon     SUBOCCIPITAL CRANIECTOMY CERVICAL LAMINECTOMY N/A 12/16/2018   Procedure: Cervical one Laminectomy/Chiari decompression;  Surgeon: Donalee Citrin, MD;  Location: [facility] OR;  Service: Neurosurgery;  Laterality: N/A;   WISDOM TOOTH EXTRACTION     Patient Active Problem List   Diagnosis Date Noted   Atypical chest pain 09/25/2022   Migraine with aura and without status migrainosus 07/24/2022   History of posttraumatic stress disorder (PTSD) 04/25/2022   Vitamin D deficiency 09/11/2019   Hyperlipidemia 09/11/2019   Palpitations 07/30/2018   Chiari I malformation (HCC) 02/13/2018   Current smoker 04/20/2017   Asthma 06/04/2013   Depression 06/04/2013    REFERRING PROVIDER: Burnetta Sabin MD  REFERRING DIAG: Back pain.    Rationale for Evaluation and Treatment: Rehabilitation  THERAPY DIAG:  Pain in thoracic spine  Cervicalgia  Other muscle spasm  ONSET DATE: Ongoing.  SUBJECTIVE:  SUBJECTIVE STATEMENT: Pt reports 3-4/10 mid-back pain today. Doing better today with pain, but legs feel weak  PERTINENT HISTORY:  Chair I malformation and subsequent surgery.  Please see above.  PAIN:  Are you having pain? Yes: NPRS scale: 5/10 Pain location: Mid-back. Pain description: Sharp, throbbing, stabbing. Aggravating factors: As above. Relieving factors: As above.  PRECAUTIONS: Chair I malformation  FALLS:  Has patient fallen in last 6 months? No  LIVING ENVIRONMENT: Lives with: lives with their family Lives in: House/apartment Has following equipment at home: None  OCCUPATION: Disabled.  PLOF: Independent  PATIENT GOALS: Lift child   OBJECTIVE:   POSTURE: rounded shoulders and forward head, right shoulder  depression.  PALPATION: Tender to palpation from T3 to T8 and increased pain on left.  UE EXTREMITY MMT:    Bilateral shoulder flexion 4 to 4+/5.  IR/ER is normal.  Bi/Tri strength is normal. DTR's:   Left Bi 1+/4+, Brach 2+/4+ and Tri 1+/4+.  Right Bi is 1+/4+, Brach 2+/4+ and Tri 2+/4+.  GAIT: WNL.  TODAY'S TREATMENT                                    EXERCISE LOG  Exercise Repetitions and Resistance Comments  Nustep Lvl 4 x 15 mins   [employer]     Blank cell = exercise not performed today   Manual Therapy Soft Tissue Mobilization: cervical and thoracic spine, STW/M to cervical and thoracic paraspinals to decrease pain and tone    Modalities  Date:  Unattended Estim: thoracic spine, IFC 80-150 Hz, 15 mins, Pain and Tone Hot Pack: thoracic spine, 15 mins, Pain and Tone   PATIENT EDUCATION:    HOME EXERCISE PROGRAM:  ASSESSMENT:  CLINICAL IMPRESSION: Pt arrives for today's treatment session reporting 4/10 thoracic spine pain. She was able to continue with therex f/b manual STW/TPR to Bil Utraps, cerv.paras, and thoracic paras while seated. Decreased pain/ mm tension end of session. LTG's are On going at this time due to pain  NTS: decreased activity tolerance, decreased strength, increased muscle spasms, postural dysfunction, and pain.   ACTIVITY LIMITATIONS: carrying, lifting, and bending  PARTICIPATION LIMITATIONS: cleaning and laundry  PERSONAL FACTORS: Time since onset of injury/illness/exacerbation are also affecting patient's functional outcome.   REHAB POTENTIAL: Good  CLINICAL DECISION MAKING: Stable/uncomplicated  EVALUATION COMPLEXITY: Low   GOALS:  LONG TERM GOALS: Target date: 10/08/23  Ind with a HEP.  Goal status: IN PROGRESS  2.  Perform ADL's with pain not > 3/10.  Goal status: IN PROGRESS  3.  Lift and carry child with pain not > 3-4/10.  Goal status: IN PROGRESS PLAN:  PT FREQUENCY: 2x/week  PT DURATION: 6  weeks  PLANNED INTERVENTIONS: Therapeutic exercises, Therapeutic activity, Patient/Family education, Self Care, Electrical stimulation, Cryotherapy, Moist heat, Ultrasound, and Manual therapy.  PLAN FOR NEXT SESSION: Modalities and STW/M as needed.  Postural exercises.   Tametra Ahart,CHRIS, PTA 09/13/2023, 1:51 PM

## 2023-09-14 ENCOUNTER — Ambulatory Visit (INDEPENDENT_AMBULATORY_CARE_PROVIDER_SITE_OTHER): Payer: 59 | Admitting: Family Medicine

## 2023-09-14 ENCOUNTER — Encounter: Payer: Self-pay | Admitting: Family Medicine

## 2023-09-14 VITALS — BP 128/78 | HR 81 | Temp 98.3°F | Wt 168.8 lb

## 2023-09-14 DIAGNOSIS — R42 Dizziness and giddiness: Secondary | ICD-10-CM | POA: Diagnosis not present

## 2023-09-14 DIAGNOSIS — G43109 Migraine with aura, not intractable, without status migrainosus: Secondary | ICD-10-CM | POA: Diagnosis not present

## 2023-09-14 DIAGNOSIS — M940 Chondrocostal junction syndrome [Tietze]: Secondary | ICD-10-CM | POA: Diagnosis not present

## 2023-09-14 MED ORDER — CELECOXIB 200 MG PO CAPS: 200.0000 mg | ORAL_CAPSULE | Freq: Two times a day (BID) | ORAL | 0 refills | Status: DC

## 2023-09-14 NOTE — Progress Notes (Signed)
Subjective:    Patient ID: Mary [name], female    DOB: [birth year], 37 y.o.   MRN: [id number]  HPI Here with her husband to discuss a wide range of symptoms, but her main concern the past 3 days is chest pain. This pain is constant, though it waxes and wanes. She describes it as sharp, and it can be fairly severe. No SOB. She called EMS to her apartment 2 days ago, and after taking an EKG they decided she was safe to wait and see [place] about it. She also talks about her frequent migraines, and she sees Dr. Everlena Cooper for these. She also describes intermittent dizziness which sometimes has a spinning quality and sometimes is more of a dysequilibrium. She admits to being under a lot of stress. She had a baby 9 months ago, and they are trying to find another place to  live. She takes Xanax three times a day, and this seems to help all these symptoms briefly.    Review of Systems  Constitutional: Negative.   Respiratory: Negative.    Cardiovascular:  Positive for chest pain. Negative for palpitations and leg swelling.  Gastrointestinal: Negative.   Genitourinary: Negative.   Neurological:  Positive for dizziness and headaches.       Objective:   Physical Exam Constitutional:      Appearance: Normal appearance. She is not ill-appearing.  Cardiovascular:     Rate and Rhythm: Normal rate and regular rhythm.     Pulses: Normal pulses.     Heart sounds: Normal heart sounds.  Pulmonary:     Effort: Pulmonary effort is normal.     Breath sounds: Normal breath sounds.     Comments: She is quite tender over the left sternal margin  Neurological:     General: No focal deficit present.     Mental Status: She is alert and oriented to person, place, and time.           Assessment & Plan:  Her chest pains are due to costochondritis, and I reassured her that her hear seems to be fine. We will treat this with Celebrex 200 mg BID and Voltaren gel QID. As for the other issues, she will follow up with  Dr. Ardyth Harps and Dr. Everlena Cooper. We spent a total of (33   ) minutes reviewing records and discussing these issues.  Gershon Crane, MD

## 2023-09-17 MED ORDER — EPINEPHRINE 0.3 MG/0.3ML IJ SOAJ: 0.3000 mg | INTRAMUSCULAR | 0 refills | Status: DC | PRN

## 2023-09-17 MED ORDER — METHYLPREDNISOLONE 4 MG PO TBPK: ORAL_TABLET | ORAL | 0 refills | Status: DC

## 2023-09-17 NOTE — Telephone Encounter (Signed)
Pt notified of update and verbalized understanding.  

## 2023-09-17 NOTE — Telephone Encounter (Signed)
Pt called again to say she does not understand why it is taking so long to get a response. Pt was informed that MD is with patients and checks his messages as best he can in between seeing patients. Pt stated this is ridiculous and that's what a CMA is for. 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 Pt was reminded again that MD is currently with patients and will get to her, as soon as he can.  Pt is getting more upset and states she just does not understand. Pt asked that these messages be forwarded to her PCP.

## 2023-09-17 NOTE — Telephone Encounter (Signed)
I sent in a Medrol dose pack (Prednisone) as well as an EpiPen

## 2023-09-17 NOTE — Telephone Encounter (Signed)
Pt said she is allergic to celebrex and would like prednisone  [facility] [address] [place], [place] - [place] [facility] [address] Phone: [phone]  Fax: [phone]

## 2023-09-20 ENCOUNTER — Ambulatory Visit: Payer: 59 | Admitting: Internal Medicine

## 2023-09-20 ENCOUNTER — Encounter: Payer: Self-pay | Admitting: Internal Medicine

## 2023-09-20 VITALS — BP 104/70 | HR 80 | Temp 98.5°F | Wt 167.9 lb

## 2023-09-20 DIAGNOSIS — H8110 Benign paroxysmal vertigo, unspecified ear: Secondary | ICD-10-CM | POA: Diagnosis not present

## 2023-09-20 NOTE — Progress Notes (Signed)
Established Patient Office Visit     CC/Reason for Visit: Vertigo, gait imbalance  HPI: Mary [name] is a 37 y.o. female who is coming in today for the above mentioned reasons.  For 1 to 2 weeks she has been experiencing vertigo, gait imbalance, dizziness.  She has some bruises from where she has been bumping into walls.  She feels like "eyes are jumpy".  She was recently seen for chest pain thought to have costochondritis and given prednisone.  Her vertigo improved temporarily with the prednisone.  Past Medical/Surgical History: Past Medical History:  Diagnosis Date   Asthma    Atypical squamous cell changes of undetermined significance (ASCUS) on cervical cytology with positive high risk human papilloma virus (HPV) 07/08/2013   Overview:   ASCUS with positive high risk HPV 07/08/13  Mild to moderate dysplasia on colpo 02/10/14   Plan for repeat colpo in 6 months (Due 08/13/14) schedulers flagged.  ASCUS with positive high risk HPV 07/08/13  Mild to moderate dysplasia on colpo 02/10/14   Plan for repeat colpo in 6 months (Due 08/13/14) schedulers flagged.   Bronchitis 01/31/2016   Chiari malformation type I (HCC)    Depression    GERD (gastroesophageal reflux disease)    H/O multiple concussions    Headache(784.0)    History of palpitations    Hx of fracture of nose    Mental disorder    Multiple fractures    Hx: of a leg and arm fracture as a child   Pneumonia    PONV (postoperative nausea and vomiting)    Post traumatic stress disorder (PTSD)    Pregnant 01/21/2016   PTSD (post-traumatic stress disorder)    Sinus infection 01/31/2016   Supervision of normal pregnancy, antepartum 02/08/2016    [facility]             13+1 week FOB   [name] 37 yo WM Dating By  LMP and [place] Pap  02/08/16 GC/CT Initial:                36+wks: Genetic Screen NT/IT:  CF screen  Anatomic [place]  Flu vaccine  Tdap Recommended ~ 28wks Glucose Screen  2 hr GBS  Feed Preference   Contraception  Circumcision  Childbirth Classes  Pediatrician      Past Surgical History:  Procedure Laterality Date   botox injections     colposcopy     DILATION AND CURETTAGE OF UTERUS     nexplanon     SUBOCCIPITAL CRANIECTOMY CERVICAL LAMINECTOMY N/A 12/16/2018   Procedure: Cervical one Laminectomy/Chiari decompression;  Surgeon: Donalee Citrin, MD;  Location: [facility] OR;  Service: Neurosurgery;  Laterality: N/A;   WISDOM TOOTH EXTRACTION      Social History:  reports that she has been smoking cigarettes. She has never used smokeless tobacco. She reports that she does not currently use alcohol. She reports that she does not use drugs.  Allergies: Allergies  Allergen Reactions   Codeine Shortness Of Breath and Other (See Comments)    Can not take in liquid form but does not recall exactly what formulation she had Has taken vicodin and percocet without problem per pt   Influenza A (H1n1) Monovalent Vaccine Anaphylaxis   Sulfa Antibiotics Anaphylaxis and Itching   Egg-Derived Products    Peanut-Containing Drug Products    Robaxin [Methocarbamol] Other (See Comments)    Migraine    Family History:  Family History  Problem Relation Age  of Onset   Cancer Mother 30       breast   Hypertension Mother    Depression Mother    Depression Father    Seizures Father    Cancer Maternal Grandmother        breast and cervical   Asthma Son    Asthma Son    Drug abuse Paternal Grandmother    Alcohol abuse Paternal Grandmother    Hypertension Other    Cancer Other      Current Outpatient Medications:    albuterol (VENTOLIN HFA) 108 (90 Base) MCG/ACT inhaler, Inhale 2 puffs into the lungs every 6 (six) hours as needed for wheezing or shortness of breath., Disp: 1 g, Rfl: 2   ALPRAZolam (XANAX) 0.25 MG tablet, Take 1 tablet (0.25 mg total) by mouth 3 (three) times daily as needed for anxiety., Disp: 90 tablet, Rfl: 0   aspirin-acetaminophen-caffeine (EXCEDRIN MIGRAINE) 250-250-65 MG tablet,  Take by mouth every 6 (six) hours as needed for headache., Disp: , Rfl:    cyclobenzaprine (FLEXERIL) 5 MG tablet, Take 1 tablet (5 mg total) by mouth 3 (three) times daily as needed for muscle spasms., Disp: 90 tablet, Rfl: 5   EPINEPHrine (EPIPEN 2-PAK) 0.3 mg/0.3 mL IJ SOAJ injection, Inject 0.3 mg into the muscle as needed for anaphylaxis., Disp: 1 each, Rfl: 0   fluticasone (FLONASE) 50 MCG/ACT nasal spray, Place 1 spray into both nostrils daily as needed for allergies or rhinitis., Disp: , Rfl:    methylPREDNISolone (MEDROL DOSEPAK) 4 MG TBPK tablet, As directed, Disp: 21 tablet, Rfl: 0   ondansetron (ZOFRAN-ODT) 4 MG disintegrating tablet, Take 1 tablet (4 mg total) by mouth every 8 (eight) hours as needed for nausea or vomiting., Disp: 30 tablet, Rfl: 5   Vitamin D, Ergocalciferol, (DRISDOL) 1.25 MG (50000 UNIT) CAPS capsule, Take 1 capsule (50,000 Units total) by mouth every 7 (seven) days., Disp: 5 capsule, Rfl: 0  Review of Systems:  Negative unless indicated in HPI.   Physical Exam: Vitals:   09/20/23 1606  BP: 104/70  Pulse: 80  Temp: 98.5 F (36.9 C)  TempSrc: Oral  SpO2: 98%  Weight: 167 lb 14.4 oz (76.2 kg)    Body mass index is 28.82 kg/m.   Physical Exam Vitals reviewed.  Constitutional:      Appearance: Normal appearance.  HENT:     Head: Normocephalic and atraumatic.  Eyes:     Conjunctiva/sclera: Conjunctivae normal.     Pupils: Pupils are equal, round, and reactive to light.  Skin:    General: Skin is warm and dry.  Neurological:     General: No focal deficit present.     Mental Status: She is alert and oriented to person, place, and time.  Psychiatric:        Mood and Affect: Mood normal.        Behavior: Behavior normal.        Thought Content: Thought content normal.        Judgment: Judgment normal.      Impression and Plan:  Benign paroxysmal positional vertigo, unspecified laterality -     Referral to Neuro Rehab   -Suspect BPPV.   Refer to neurorehab for vestibular therapy.  May have a component of acute labyrinthitis given recent URI and improvement with prednisone.  Time spent:30 minutes reviewing chart, interviewing and examining patient and formulating plan of care.     Chaya Jan, MD [facility]

## 2023-09-28 ENCOUNTER — Ambulatory Visit: Payer: 59

## 2023-10-04 ENCOUNTER — Ambulatory Visit: Payer: 59

## 2023-10-04 DIAGNOSIS — R2681 Unsteadiness on feet: Secondary | ICD-10-CM | POA: Diagnosis not present

## 2023-10-04 DIAGNOSIS — R293 Abnormal posture: Secondary | ICD-10-CM | POA: Diagnosis not present

## 2023-10-04 DIAGNOSIS — M546 Pain in thoracic spine: Secondary | ICD-10-CM | POA: Diagnosis not present

## 2023-10-04 DIAGNOSIS — R42 Dizziness and giddiness: Secondary | ICD-10-CM

## 2023-10-04 DIAGNOSIS — M542 Cervicalgia: Secondary | ICD-10-CM | POA: Diagnosis not present

## 2023-10-04 DIAGNOSIS — M62838 Other muscle spasm: Secondary | ICD-10-CM | POA: Diagnosis not present

## 2023-10-04 DIAGNOSIS — M6281 Muscle weakness (generalized): Secondary | ICD-10-CM | POA: Diagnosis not present

## 2023-10-04 NOTE — Therapy (Signed)
OUTPATIENT PHYSICAL THERAPY VESTIBULAR EVALUATION     Patient Name: Mary [name] MRN: [id number] DOB:05/31/1986, 37 y.o., female Today's Date: 10/04/2023  END OF SESSION:  PT End of Session - 10/04/23 1151     Visit Number 1    Number of Visits 1    Authorization Type [employer]    PT Start Time 1148    PT Stop Time 1230    PT Time Calculation (min) 42 min    Activity Tolerance Patient tolerated treatment well    Behavior During Therapy WFL for tasks assessed/performed             Past Medical History:  Diagnosis Date   Asthma    Atypical squamous cell changes of undetermined significance (ASCUS) on cervical cytology with positive high risk human papilloma virus (HPV) 07/08/2013   Overview:   ASCUS with positive high risk HPV 07/08/13  Mild to moderate dysplasia on colpo 02/10/14   Plan for repeat colpo in 6 months (Due 08/13/14) schedulers flagged.  ASCUS with positive high risk HPV 07/08/13  Mild to moderate dysplasia on colpo 02/10/14   Plan for repeat colpo in 6 months (Due 08/13/14) schedulers flagged.   Bronchitis 01/31/2016   Chiari malformation type I (HCC)    Depression    GERD (gastroesophageal reflux disease)    H/O multiple concussions    Headache(784.0)    History of palpitations    Hx of fracture of nose    Mental disorder    Multiple fractures    Hx: of a leg and arm fracture as a child   Pneumonia    PONV (postoperative nausea and vomiting)    Post traumatic stress disorder (PTSD)    Pregnant 01/21/2016   PTSD (post-traumatic stress disorder)    Sinus infection 01/31/2016   Supervision of normal pregnancy, antepartum 02/08/2016    [facility]             13+1 week FOB   [name] 37 yo WM Dating By  LMP and [place] Pap  02/08/16 GC/CT Initial:                36+wks: Genetic Screen NT/IT:  CF screen  Anatomic [place]  Flu vaccine  Tdap Recommended ~ 28wks Glucose Screen  2 hr GBS  Feed Preference  Contraception  Circumcision  Childbirth Classes   Pediatrician     Past Surgical History:  Procedure Laterality Date   botox injections     colposcopy     DILATION AND CURETTAGE OF UTERUS     nexplanon     SUBOCCIPITAL CRANIECTOMY CERVICAL LAMINECTOMY N/A 12/16/2018   Procedure: Cervical one Laminectomy/Chiari decompression;  Surgeon: Donalee Citrin, MD;  Location: [facility] OR;  Service: Neurosurgery;  Laterality: N/A;   WISDOM TOOTH EXTRACTION     Patient Active Problem List   Diagnosis Date Noted   Atypical chest pain 09/25/2022   Migraine with aura and without status migrainosus 07/24/2022   History of posttraumatic stress disorder (PTSD) 04/25/2022   Vitamin D deficiency 09/11/2019   Hyperlipidemia 09/11/2019   Palpitations 07/30/2018   Chiari I malformation (HCC) 02/13/2018   Current smoker 04/20/2017   Asthma 06/04/2013   Depression 06/04/2013    PCP: Philip Aspen, Limmie Patricia, MD  REFERRING PROVIDER: Philip Aspen, Limmie Patricia, MD   REFERRING DIAG: H81.10 (ICD-10-CM) - Benign paroxysmal positional vertigo, unspecified laterality   THERAPY DIAG:  Dizziness and giddiness - Plan: PT plan of care cert/re-cert  Unsteadiness on  feet - Plan: PT plan of care cert/re-cert  ONSET DATE: 09/20/23 referral  Rationale for Evaluation and Treatment: Rehabilitation  SUBJECTIVE:   SUBJECTIVE STATEMENT: Patient arrives to PT alone, no AD. Started experiencing dizziness roughly 4 years ago after surgery for chiari malformation. Per patient, neuro MD states that dizziness is "normal" after this surgery. Walking up a hill, when she gets very hot, if there's a lot of traffic or people coming "at" her she'll experience her dizziness.  Pt accompanied by: self  PERTINENT HISTORY: anxiety, asthma, depression, GERD, chiari malformation type 1, PTSD   PAIN:  Are you having pain? No  PRECAUTIONS: Fall   WEIGHT BEARING RESTRICTIONS: No  FALLS: Has patient fallen in last 6 months? No  LIVING ENVIRONMENT: Lives with: lives with their  family Lives in: House/apartment Stairs: Yes: External: 3 steps; none Has following equipment at home: None  PLOF: Independent hasn't been driving for a year  PATIENT GOALS: to find answers  OBJECTIVE:  Note: Objective measures were completed at Evaluation unless otherwise noted.  DIAGNOSTIC FINDINGS: 06/07/2019 cervical MRI IMPRESSION: Suboccipital craniectomy and C1 laminectomy with resolved foramen magnum stenosis. There is encapsulated fluid beneath the occipital membrane with no brain sagging or associated mass effect.  COGNITION: Overall cognitive status: Within functional limits for tasks assessed     POSTURE:  No Significant postural limitations  Cervical ROM:   Grossly limited due to suboccipital crani  STRENGTH: WFL  BED MOBILITY:  Independent, but does endorse some dizziness with bed mobility   GAIT: Gait pattern: WFL  VESTIBULAR ASSESSMENT:  GENERAL OBSERVATION: NAD   SYMPTOM BEHAVIOR:  Subjective history: see above  Non-Vestibular symptoms: diplopia, neck pain, headaches, and nausea/vomiting  Type of dizziness: Imbalance (Disequilibrium) and Spinning/Vertigo  Frequency: all day every day  Duration: all day every day   Aggravating factors: Spontaneous, Induced by position change: lying supine, Induced by motion: occur when walking, looking up at the ceiling, bending down to the ground, turning body quickly, turning head quickly, driving, and sitting in a moving car, Worse outside or in busy environment, Occurs when standing still , and Moving eyes  Relieving factors: medication  Progression of symptoms: unchanged  OCULOMOTOR EXAM:  Ocular Alignment: normal  Ocular ROM: No Limitations  Spontaneous Nystagmus: absent  Gaze-Induced Nystagmus: absent  Smooth Pursuits: intact  Saccades: intact  Convergence/Divergence: <5 cm   VESTIBULAR - OCULAR REFLEX:   Slow VOR: Normal  VOR Cancellation: Normal blinking a lot, rates 2-3/5 dizziness  Head-Impulse  Test: HIT Right: negative HIT Left: negative  Dynamic Visual Acuity: to be assessed   POSITIONAL TESTING: not indicated based on subjective report  MOTION SENSITIVITY:  Motion Sensitivity Quotient Intensity: 0 = none, 1 = Lightheaded, 2 = Mild, 3 = Moderate, 4 = Severe, 5 = Vomiting  Intensity  1. Sitting to supine   2. Supine to L side   3. Supine to R side   4. Supine to sitting   5. L Hallpike-Dix   6. Up from L    7. R Hallpike-Dix   8. Up from R    9. Sitting, head tipped to L knee 0  10. Head up from L knee 0  11. Sitting, head tipped to R knee 0  12. Head up from R knee 0  13. Sitting head turns x5 4  14.Sitting head nods x5 4 "like I'm still moving"   15. In stance, 180 turn to L    16. In stance, 180 turn to  R       VESTIBULAR TREATMENT:                                                                                                   N/a eval  PATIENT EDUCATION: Education details: PT POC, exam findings, need to f/u with neuro re: complaints Person educated: Patient Education method: Explanation Education comprehension: verbalized understanding  GOALS: Not indicated as patient is not appropriate for skilled PT services at this time  ASSESSMENT:  CLINICAL IMPRESSION: Patient is a 37 y.o. female who was seen today for physical therapy evaluation and treatment for dizziness. Patient with complex neuro history including previous brain injury and chiari malformation sx. She reports her eyes "jumping" frequently and as though she can't focus on one thing. She endorses spontaneous dizziness that occurs all day every day. She reports feeling as though there's "something wrong" with her brain and questions a CSF leak s/p chiari malformation sx as her symptoms started post sx. She does not report nor demonstrate positional vertigo. She would benefit from a further medical work up prior to physical therapy. Patient verbalized understanding.     CLINICAL DECISION MAKING:  Evolving/moderate complexity  EVALUATION COMPLEXITY: Moderate   PLAN:  PT FREQUENCY: one time visit  PT DURATION: other: 1x visit    Westley Foots, PT Westley Foots, PT, DPT, CBIS  10/04/2023, 2:15 PM

## 2023-10-08 ENCOUNTER — Encounter: Payer: Self-pay | Admitting: Internal Medicine

## 2023-10-08 DIAGNOSIS — M549 Dorsalgia, unspecified: Secondary | ICD-10-CM

## 2023-10-15 ENCOUNTER — Other Ambulatory Visit: Payer: Self-pay | Admitting: Adult Health

## 2023-10-15 MED ORDER — ALPRAZOLAM 0.25 MG PO TABS: 0.2500 mg | ORAL_TABLET | Freq: Three times a day (TID) | ORAL | 0 refills | Status: DC | PRN

## 2023-10-16 ENCOUNTER — Encounter: Payer: Self-pay | Admitting: Internal Medicine

## 2023-10-16 ENCOUNTER — Other Ambulatory Visit: Payer: Self-pay | Admitting: Obstetrics and Gynecology

## 2023-10-16 DIAGNOSIS — R42 Dizziness and giddiness: Secondary | ICD-10-CM

## 2023-10-16 DIAGNOSIS — H8110 Benign paroxysmal vertigo, unspecified ear: Secondary | ICD-10-CM

## 2023-10-18 ENCOUNTER — Ambulatory Visit: Payer: 59

## 2023-10-18 ENCOUNTER — Telehealth: Payer: 59 | Admitting: Internal Medicine

## 2023-10-18 DIAGNOSIS — R6889 Other general symptoms and signs: Secondary | ICD-10-CM

## 2023-10-18 NOTE — Progress Notes (Signed)
 Per patient no change in vitals since last visit, unable to obtain new vitals due to telehealth visit

## 2023-10-18 NOTE — Progress Notes (Signed)
Virtual Visit via Video Note  I connected with Mary [name] on 10/18/23 at  3:00 PM EST by a video enabled telemedicine application and verified that I am speaking with the correct person using two identifiers.  Location patient: home Location provider: work office Persons participating in the virtual visit: patient, provider  I discussed the limitations of evaluation and management by telemedicine and the availability of in person appointments. The patient expressed understanding and agreed to proceed.   HPI: For the past 2 days has been having myalgias, headaches and congestion.  She was exposed to an aunt who tested positive for flu.  She is unable to come in the office today.  Kids have been sick with the same have not been tested yet.   ROS: Negative unless indicated in HPI.  Past Medical History:  Diagnosis Date   Asthma    Atypical squamous cell changes of undetermined significance (ASCUS) on cervical cytology with positive high risk human papilloma virus (HPV) 07/08/2013   Overview:   ASCUS with positive high risk HPV 07/08/13  Mild to moderate dysplasia on colpo 02/10/14   Plan for repeat colpo in 6 months (Due 08/13/14) schedulers flagged.  ASCUS with positive high risk HPV 07/08/13  Mild to moderate dysplasia on colpo 02/10/14   Plan for repeat colpo in 6 months (Due 08/13/14) schedulers flagged.   Bronchitis 01/31/2016   Chiari malformation type I (HCC)    Depression    GERD (gastroesophageal reflux disease)    H/O multiple concussions    Headache(784.0)    History of palpitations    Hx of fracture of nose    Mental disorder    Multiple fractures    Hx: of a leg and arm fracture as a child   Pneumonia    PONV (postoperative nausea and vomiting)    Post traumatic stress disorder (PTSD)    Pregnant 01/21/2016   PTSD (post-traumatic stress disorder)    Sinus infection 01/31/2016   Supervision of normal pregnancy, antepartum 02/08/2016    [facility]iated  Care at             13+1 week FOB   [name] 37 yo WM Dating By  LMP and [place] Pap  02/08/16 GC/CT Initial:                37+wks: Genetic Screen NT/IT:  CF screen  Anatomic [place]  Flu vaccine  Tdap Recommended ~ 28wks Glucose Screen  2 hr GBS  Feed Preference  Contraception  Circumcision  Childbirth Classes  Pediatrician      Past Surgical History:  Procedure Laterality Date   botox injections     colposcopy     DILATION AND CURETTAGE OF UTERUS     nexplanon     SUBOCCIPITAL CRANIECTOMY CERVICAL LAMINECTOMY N/A 12/16/2018   Procedure: Cervical one Laminectomy/Chiari decompression;  Surgeon: Donalee Citrin, MD;  Location: [facility] OR;  Service: Neurosurgery;  Laterality: N/A;   WISDOM TOOTH EXTRACTION      Family History  Problem Relation Age of Onset   Cancer Mother 63       breast   Hypertension Mother    Depression Mother    Depression Father    Seizures Father    Cancer Maternal Grandmother        breast and cervical   Asthma Son    Asthma Son    Drug abuse Paternal Grandmother    Alcohol abuse Paternal Grandmother    Hypertension  Other    Cancer Other     SOCIAL HX:   reports that she has been smoking cigarettes. She has never used smokeless tobacco. She reports that she does not currently use alcohol. She reports that she does not use drugs.   Current Outpatient Medications:    albuterol (VENTOLIN HFA) 108 (90 Base) MCG/ACT inhaler, Inhale 2 puffs into the lungs every 6 (six) hours as needed for wheezing or shortness of breath., Disp: 1 g, Rfl: 2   ALPRAZolam (XANAX) 0.25 MG tablet, Take 1 tablet (0.25 mg total) by mouth 3 (three) times daily as needed for anxiety., Disp: 90 tablet, Rfl: 0   aspirin-acetaminophen-caffeine (EXCEDRIN MIGRAINE) 250-250-65 MG tablet, Take by mouth every 6 (six) hours as needed for headache., Disp: , Rfl:    cyclobenzaprine (FLEXERIL) 5 MG tablet, Take 1 tablet (5 mg total) by mouth 3 (three) times daily as needed for muscle spasms., Disp: 90 tablet, Rfl:  5   EPINEPHrine (EPIPEN 2-PAK) 0.3 mg/0.3 mL IJ SOAJ injection, Inject 0.3 mg into the muscle as needed for anaphylaxis., Disp: 1 each, Rfl: 0   fluticasone (FLONASE) 50 MCG/ACT nasal spray, Place 1 spray into both nostrils daily as needed for allergies or rhinitis., Disp: , Rfl:    methylPREDNISolone (MEDROL DOSEPAK) 4 MG TBPK tablet, As directed, Disp: 21 tablet, Rfl: 0   ondansetron (ZOFRAN-ODT) 4 MG disintegrating tablet, Take 1 tablet (4 mg total) by mouth every 8 (eight) hours as needed for nausea or vomiting., Disp: 30 tablet, Rfl: 5   Vitamin D, Ergocalciferol, (DRISDOL) 1.25 MG (50000 UNIT) CAPS capsule, Take 1 capsule (50,000 Units total) by mouth every 7 (seven) days., Disp: 5 capsule, Rfl: 0  EXAM:   VITALS per patient if applicable: None reported  GENERAL: alert, oriented, appears well and in no acute distress  HEENT: atraumatic, conjunttiva clear, no obvious abnormalities on inspection of external nose and ears  NECK: normal movements of the head and neck  LUNGS: on inspection no signs of respiratory distress, breathing rate appears normal, no obvious gross increased work of breathing, gasping or wheezing  CV: no obvious cyanosis  MS: moves all visible extremities without noticeable abnormality  PSYCH/NEURO: pleasant and cooperative, no obvious depression or anxiety, speech and thought processing grossly intact  ASSESSMENT AND PLAN:   Flu-like symptoms  -Given exam findings, PNA, pharyngitis, ear infection are not likely, hence abx have not been prescribed. -Have advised rest, fluids, OTC antihistamines, cough suppressants and mucinex. -RTC if no improvement in 10-14 days.  -She will stop by the pharmacy to purchase an over-the-counter COVID/flu test and notify us if positive.    I discussed the assessment and treatment plan with the patient. The patient was provided an opportunity to ask questions and all were answered. The patient agreed with the plan and  demonstrated an understanding of the instructions.   The patient was advised to call back or seek an in-person evaluation if the symptoms worsen or if the condition fails to improve as anticipated.    Chaya Jan, MD  [facility]

## 2023-10-19 ENCOUNTER — Encounter: Payer: Self-pay | Admitting: Internal Medicine

## 2023-10-26 ENCOUNTER — Encounter: Payer: Self-pay | Admitting: Physical Therapy

## 2023-10-31 ENCOUNTER — Encounter: Payer: Self-pay | Admitting: *Deleted

## 2023-11-06 ENCOUNTER — Ambulatory Visit: Payer: 59 | Attending: Internal Medicine | Admitting: Physical Therapy

## 2023-11-06 ENCOUNTER — Ambulatory Visit (INDEPENDENT_AMBULATORY_CARE_PROVIDER_SITE_OTHER): Payer: 59 | Admitting: Family Medicine

## 2023-11-06 DIAGNOSIS — M549 Dorsalgia, unspecified: Secondary | ICD-10-CM | POA: Insufficient documentation

## 2023-11-06 DIAGNOSIS — M546 Pain in thoracic spine: Secondary | ICD-10-CM | POA: Insufficient documentation

## 2023-11-06 NOTE — Progress Notes (Signed)
Patient is in the process of moving and can not talk today. She reports she will call back to reschedule.

## 2023-11-06 NOTE — Therapy (Signed)
OUTPATIENT PHYSICAL THERAPY THORACOLUMBAR EVALUATION   Patient Name: Mary [name] MRN: [id number] DOB:10-04-1986, 37 y.o., female Today's Date: 11/06/2023  END OF SESSION:  PT End of Session - 11/06/23 1137     Visit Number 1    Number of Visits 12    Date for PT Re-Evaluation 12/18/23    PT Start Time 1101    PT Stop Time 1126    PT Time Calculation (min) 25 min    Activity Tolerance Patient tolerated treatment well    Behavior During Therapy WFL for tasks assessed/performed             Past Medical History:  Diagnosis Date   Asthma    Atypical squamous cell changes of undetermined significance (ASCUS) on cervical cytology with positive high risk human papilloma virus (HPV) 07/08/2013   Overview:   ASCUS with positive high risk HPV 07/08/13  Mild to moderate dysplasia on colpo 02/10/14   Plan for repeat colpo in 6 months (Due 08/13/14) schedulers flagged.  ASCUS with positive high risk HPV 07/08/13  Mild to moderate dysplasia on colpo 02/10/14   Plan for repeat colpo in 6 months (Due 08/13/14) schedulers flagged.   Bronchitis 01/31/2016   Chiari malformation type I (HCC)    Depression    GERD (gastroesophageal reflux disease)    H/O multiple concussions    Headache(784.0)    History of palpitations    Hx of fracture of nose    Mental disorder    Multiple fractures    Hx: of a leg and arm fracture as a child   Pneumonia    PONV (postoperative nausea and vomiting)    Post traumatic stress disorder (PTSD)    Pregnant 01/21/2016   PTSD (post-traumatic stress disorder)    Sinus infection 01/31/2016   Supervision of normal pregnancy, antepartum 02/08/2016    [facility] Care at             13+1 week FOB   [name] 37 yo WM Dating By  LMP and [place] Pap  02/08/16 GC/CT Initial:                36+wks: Genetic Screen NT/IT:  CF screen  Anatomic [place]  Flu vaccine  Tdap Recommended ~ 28wks Glucose Screen  2 hr GBS  Feed Preference  Contraception  Circumcision   Childbirth Classes  Pediatrician     Past Surgical History:  Procedure Laterality Date   botox injections     colposcopy     DILATION AND CURETTAGE OF UTERUS     nexplanon     SUBOCCIPITAL CRANIECTOMY CERVICAL LAMINECTOMY N/A 12/16/2018   Procedure: Cervical one Laminectomy/Chiari decompression;  Surgeon: Donalee Citrin, MD;  Location: [facility] OR;  Service: Neurosurgery;  Laterality: N/A;   WISDOM TOOTH EXTRACTION     Patient Active Problem List   Diagnosis Date Noted   Atypical chest pain 09/25/2022   Migraine with aura and without status migrainosus 07/24/2022   History of posttraumatic stress disorder (PTSD) 04/25/2022   Vitamin D deficiency 09/11/2019   Hyperlipidemia 09/11/2019   Palpitations 07/30/2018   Chiari I malformation (HCC) 02/13/2018   Current smoker 04/20/2017   Asthma 06/04/2013   Depression 06/04/2013    REFERRING PROVIDER: Chaya Jan MD  REFERRING DIAG: Back pain.  Rationale for Evaluation and Treatment: Rehabilitation  THERAPY DIAG:  Pain in thoracic spine  ONSET DATE: Ongoing.  SUBJECTIVE:  SUBJECTIVE STATEMENT: The patient returns to PT (last seen on 09/13/23) with continued c/o mid-back pain but better than it was.  This is a chronic condition.  Her pain is rated at a 4/10.  She has occasions of a sharp pain when bending over an then erecting her posture.     PERTINENT HISTORY:  Chair I malformation and subsequent surgery. Please see above.   PAIN:  Are you having pain? Yes: NPRS scale: 4/10 Pain location: Mid-back. Pain description: Ache, sharp. Aggravating factors: As above. Relieving factors: Rest, heat and ice.  PRECAUTIONS: Chair I malformation   FALLS:  Has patient fallen in last 6 months? No  LIVING ENVIRONMENT: Lives with: lives with their  family Lives in: House/apartment Has following equipment at home: None  OCCUPATION: Disabled.  She has been caring for her ill Mother recently.  PLOF: Independent  PATIENT GOALS: Would like to not have any mid-back pain with activities.      OBJECTIVE:  Note: Objective measures were completed at Evaluation unless otherwise noted.  POSTURE:  Right shoulder depression.  PALPATION: CC of pain is bilaterally from T3 to T8.  UE ROM:  WNL.   UPPER EXTREMITY MMT:    Bilateral shoulder flexion and extension is 4+/5.  GAIT: WNL.     PATIENT EDUCATION:  Education details:  Person educated:  International aid/development worker:  Education comprehension:   HOME EXERCISE PROGRAM:   ASSESSMENT:  CLINICAL IMPRESSION: The patient presents to OPPT with continued c/o mid-back pain.  She was last seen on 09/13/23 and states that her pain is not as high as it was before.  She continues to have bilateral Thoracic pain from T3 to T8.  She will have an occasional sharp pain when bending and then resuming an erect posture.   Patient will benefit from skilled physical therapy intervention to address pain and deficits.   OBJECTIVE IMPAIRMENTS: decreased activity tolerance, decreased strength, increased muscle spasms, and pain.   ACTIVITY LIMITATIONS: carrying, lifting, and bending  PARTICIPATION LIMITATIONS: cleaning and laundry  PERSONAL FACTORS: Time since onset of injury/illness/exacerbation are also affecting patient's functional outcome.   REHAB POTENTIAL: Good  CLINICAL DECISION MAKING: Stable/uncomplicated  EVALUATION COMPLEXITY: Low   GOALS:  LONG TERM GOALS: Target date: 12/18/23  Ind with a HEP.  Goal status: INITIAL   2.  Perform ADL's with pain not > 1-2/10.  Goal status: INITIAL  3.  Bending and resume upright posture with pain not > 1-2/10. Baseline:  Goal status: INITIAL   PLAN:  PT FREQUENCY: 2x/week  PT DURATION: 6 weeks  PLANNED INTERVENTIONS: 97110-Therapeutic  exercises, 97530- Therapeutic activity, [id number]- Neuromuscular re-education, 97535- Self Care, [id number]- Manual therapy, 97014- Electrical stimulation (unattended), 97035- Ultrasound, Patient/Family education, Cryotherapy, and Moist heat.  PLAN FOR NEXT SESSION: Scapular strengthening.   Kia Varnadore, [place], PT 11/06/2023, 12:06 PM

## 2023-11-14 ENCOUNTER — Other Ambulatory Visit: Payer: Self-pay | Admitting: Internal Medicine

## 2023-11-14 ENCOUNTER — Ambulatory Visit: Payer: 59

## 2023-11-14 VITALS — Ht 64.0 in | Wt 167.0 lb

## 2023-11-14 DIAGNOSIS — Z0000 Encounter for general adult medical examination without abnormal findings: Secondary | ICD-10-CM | POA: Diagnosis not present

## 2023-11-14 DIAGNOSIS — Z122 Encounter for screening for malignant neoplasm of respiratory organs: Secondary | ICD-10-CM

## 2023-11-14 NOTE — Patient Instructions (Addendum)
Mary [name] , Thank you for taking time to come for your Medicare Wellness Visit. I appreciate your ongoing commitment to your health goals. Please review the following plan we discussed and let me know if I can assist you in the future.   Referrals/Orders/Follow-Ups/Clinician Recommendations:   This is a list of the screening recommended for you and due dates:  Health Maintenance  Topic Date Due   COVID-19 Vaccine (1) Never done   Medicare Annual Wellness Visit  11/13/2024   Pap with HPV screening  01/05/2028   DTaP/Tdap/Td vaccine (4 - Td or Tdap) 11/13/2032   Hepatitis C Screening  Completed   HIV Screening  Completed   HPV Vaccine  Aged Out   Flu Shot  Discontinued    Advanced directives: (Provided) Advance directive discussed with you today. I have provided a copy for you to complete at home and have notarized. Once this is complete, please bring a copy in to our office so we can scan it into your chart.   Next Medicare Annual Wellness Visit scheduled for next year: Yes  insert Preventive Care Attachment Reference

## 2023-11-14 NOTE — Progress Notes (Signed)
Subjective:   Mary [name] is a 37 y.o. female who presents for Medicare Annual (Subsequent) preventive examination.  Visit Complete: Virtual I connected with  [name] on 11/14/23 by a audio enabled telemedicine application and verified that I am speaking with the correct person using two identifiers.  Patient Location: Home  Provider Location: Home Office  I discussed the limitations of evaluation and management by telemedicine. The patient expressed understanding and agreed to proceed.  Vital Signs: Because this visit was a virtual/telehealth visit, some criteria may be missing or patient reported. Any vitals not documented were not able to be obtained and vitals that have been documented are patient reported.    Cardiac Risk Factors include: advanced age (>67men, >84 women)     Objective:    Today's Vitals   11/14/23 0847  Weight: 167 lb (75.8 kg)  Height: 5\' 4"  (1.626 m)   Body mass index is 28.67 kg/m.     11/14/2023    9:00 AM 10/04/2023   11:52 AM 08/27/2023   12:17 PM 11/27/2022    8:51 AM 11/27/2022    7:17 AM 09/28/2022    9:53 AM 09/18/2022    4:43 PM  Advanced Directives  Does Patient Have a Medical Advance Directive? No No No No No No No  Would patient like information on creating a medical advance directive? No - Patient declined   No - Patient declined No - Patient declined No - Patient declined     Current Medications (verified) Outpatient Encounter Medications as of 11/14/2023  Medication Sig   albuterol (VENTOLIN HFA) 108 (90 Base) MCG/ACT inhaler Inhale 2 puffs into the lungs every 6 (six) hours as needed for wheezing or shortness of breath.   ALPRAZolam (XANAX) 0.25 MG tablet Take 1 tablet (0.25 mg total) by mouth 3 (three) times daily as needed for anxiety.   aspirin-acetaminophen-caffeine (EXCEDRIN MIGRAINE) 250-250-65 MG tablet Take by mouth every 6 (six) hours as needed for headache.   cyclobenzaprine (FLEXERIL) 5 MG tablet Take 1 tablet  (5 mg total) by mouth 3 (three) times daily as needed for muscle spasms.   EPINEPHrine (EPIPEN 2-PAK) 0.3 mg/0.3 mL IJ SOAJ injection Inject 0.3 mg into the muscle as needed for anaphylaxis.   fluticasone (FLONASE) 50 MCG/ACT nasal spray Place 1 spray into both nostrils daily as needed for allergies or rhinitis.   methylPREDNISolone (MEDROL DOSEPAK) 4 MG TBPK tablet As directed   ondansetron (ZOFRAN-ODT) 4 MG disintegrating tablet Take 1 tablet (4 mg total) by mouth every 8 (eight) hours as needed for nausea or vomiting.   Vitamin D, Ergocalciferol, (DRISDOL) 1.25 MG (50000 UNIT) CAPS capsule Take 1 capsule (50,000 Units total) by mouth every 7 (seven) days.   No facility-administered encounter medications on file as of 11/14/2023.    Allergies (verified) Codeine, Influenza a (h1n1) monovalent vaccine, Sulfa antibiotics, Egg-derived products, Peanut-containing drug products, and Robaxin [methocarbamol]   History: Past Medical History:  Diagnosis Date   Asthma    Atypical squamous cell changes of undetermined significance (ASCUS) on cervical cytology with positive high risk human papilloma virus (HPV) 07/08/2013   Overview:   ASCUS with positive high risk HPV 07/08/13  Mild to moderate dysplasia on colpo 02/10/14   Plan for repeat colpo in 6 months (Due 08/13/14) schedulers flagged.  ASCUS with positive high risk HPV 07/08/13  Mild to moderate dysplasia on colpo 02/10/14   Plan for repeat colpo in 6 months (Due 08/13/14) schedulers flagged.   Bronchitis 01/31/2016  Chiari malformation type I (HCC)    Depression    GERD (gastroesophageal reflux disease)    H/O multiple concussions    Headache(784.0)    History of palpitations    Hx of fracture of nose    Mental disorder    Multiple fractures    Hx: of a leg and arm fracture as a child   Pneumonia    PONV (postoperative nausea and vomiting)    Post traumatic stress disorder (PTSD)    Pregnant 01/21/2016   PTSD (post-traumatic stress disorder)     Sinus infection 01/31/2016   Supervision of normal pregnancy, antepartum 02/08/2016    [facility] at             13+1 week FOB   [name] 37 yo WM Dating By  LMP and [place] Pap  02/08/16 GC/CT Initial:                36+wks: Genetic Screen NT/IT:  CF screen  Anatomic [place]  Flu vaccine  Tdap Recommended ~ 28wks Glucose Screen  2 hr GBS  Feed Preference  Contraception  Circumcision  Childbirth Classes  Pediatrician     Past Surgical History:  Procedure Laterality Date   botox injections     colposcopy     DILATION AND CURETTAGE OF UTERUS     nexplanon     SUBOCCIPITAL CRANIECTOMY CERVICAL LAMINECTOMY N/A 12/16/2018   Procedure: Cervical one Laminectomy/Chiari decompression;  Surgeon: Donalee Citrin, MD;  [facility];  Service: Neurosurgery;  Laterality: N/A;   WISDOM TOOTH EXTRACTION     Family History  Problem Relation Age of Onset   Cancer Mother 60       breast   Hypertension Mother    Depression Mother    Depression Father    Seizures Father    Cancer Maternal Grandmother        breast and cervical   Asthma Son    Asthma Son    Drug abuse Paternal Grandmother    Alcohol abuse Paternal Grandmother    Hypertension Other    Cancer Other    Social History   Socioeconomic History   Marital status: Significant Other    Spouse name: Not on file   Number of children: 4   Years of education: Not on file   Highest education level: GED or equivalent  Occupational History   Occupation: disabled    Comment: PTSD  Tobacco Use   Smoking status: Some Days    Current packs/day: 0.50    Types: Cigarettes   Smokeless tobacco: Never   Tobacco comments:    NEVER USE SNUFF OR CHEWING TOBACCO  Vaping Use   Vaping status: Never Used  Substance and Sexual Activity   Alcohol use: Not Currently    Comment: social   Drug use: No   Sexual activity: Yes    Birth control/protection: None  Other Topics Concern   Not on file  Social History Narrative   Patient is  right-handed. She is separated, has 4 sons. Is on disabilty for PTSD. She has one caffeine drink daily, walks 3 x week.home is one level   Social Determinants of Health   Financial Resource Strain: Low Risk  (11/14/2023)   Overall Financial Resource Strain (CARDIA)    Difficulty of Paying Living Expenses: Not hard at all  Food Insecurity: No Food Insecurity (11/14/2023)   Hunger Vital Sign    Worried About Running Out of Food in the  Last Year: Never true    Ran Out of Food in the Last Year: Never true  Transportation Needs: No Transportation Needs (11/14/2023)   PRAPARE - Administrator, Civil Service (Medical): No    Lack of Transportation (Non-Medical): No  Physical Activity: Inactive (11/14/2023)   Exercise Vital Sign    Days of Exercise per Week: 0 days    Minutes of Exercise per Session: 0 min  Stress: Stress Concern Present (11/14/2023)   [employer] of Occupational Health - Occupational Stress Questionnaire    Feeling of Stress : Rather much  Social Connections: Moderately Integrated (11/14/2023)   Social Connection and Isolation Panel [NHANES]    Frequency of Communication with Friends and Family: More than three times a week    Frequency of Social Gatherings with Friends and Family: More than three times a week    Attends Religious Services: More than 4 times per year    Active Member of [employer] or Organizations: Yes    Attends Engineer, structural: More than 4 times per year    Marital Status: Divorced    Tobacco Counseling Ready to quit: Yes Counseling given: Yes Tobacco comments: NEVER USE SNUFF OR CHEWING TOBACCO   Clinical Intake:  Pre-visit preparation completed: Yes  Pain : No/denies pain     BMI - recorded: 28.67 Nutritional Status: BMI 25 -29 Overweight Nutritional Risks: None Diabetes: No  How often do you need to have someone help you when you read instructions, pamphlets, or other written materials from your doctor or  pharmacy?: 1 - Never  Interpreter Needed?: No  Information entered by :: Elvera Maria LPN   Activities of Daily Living    11/14/2023    8:58 AM 11/27/2022    8:54 AM  In your present state of health, do you have any difficulty performing the following activities:  Hearing? 0 0  Vision? 0 0  Difficulty concentrating or making decisions? 0 0  Walking or climbing stairs? 0 0  Dressing or bathing? 0 0  Doing errands, shopping? 0   Preparing Food and eating ? N   Using the Toilet? N   In the past six months, have you accidently leaked urine? N   Do you have problems with loss of bowel control? N   Managing your Medications? N   Managing your Finances? N   Housekeeping or managing your Housekeeping? N     Patient Care Team: Philip Aspen, Limmie Patricia, MD as PCP - General (Internal Medicine) Thomasene Ripple, DO as PCP - Cardiology (Cardiology) Drema Dallas, DO as Consulting Physician (Neurology)  Indicate any recent Medical Services you may have received from other than [facility] providers in the past year (date may be approximate).     Assessment:   This is a routine wellness examination for [place].  Hearing/Vision screen Hearing Screening - Comments:: Denies hearing difficulties   Vision Screening - Comments:: Wears rx glasses - up to date with routine eye exams with  Dr Mayford Knife   Goals Addressed               This Visit's Progress     Increase physical activity (pt-stated)        Lose weight and complete personal projects.       Depression Screen    11/14/2023    8:55 AM 01/30/2023   11:50 AM 06/15/2022    4:33 PM 11/08/2021    2:19 PM 02/10/2021    3:52 PM 03/31/2020  11:31 AM 09/11/2019    9:47 AM  PHQ 2/9 Scores  PHQ - 2 Score 0 0 0 0 0 0 0  PHQ- 9 Score  2 12 3 10 2 2     Fall Risk    11/14/2023    8:59 AM 01/30/2023   11:49 AM 08/29/2022    8:33 AM 06/15/2022   10:23 AM 11/08/2021    2:21 PM  Fall Risk   Falls in the past year? 0 0 0 0 0  Number falls in  past yr: 0 0 0 0 0  Injury with Fall? 0 0 0 0 0  Risk for fall due to : No Fall Risks No Fall Risks     Follow up Falls prevention discussed Falls evaluation completed       MEDICARE RISK AT HOME: Medicare Risk at Home Any stairs in or around the home?: Yes If so, are there any without handrails?: No Home free of loose throw rugs in walkways, pet beds, electrical cords, etc?: Yes Adequate lighting in your home to reduce risk of falls?: Yes Life alert?: No Use of a cane, walker or w/c?: No Grab bars in the bathroom?: No Shower chair or bench in shower?: No Elevated toilet seat or a handicapped toilet?: No  TIMED UP AND GO:  Was the test performed?  No    Cognitive Function:        11/14/2023    9:00 AM  6CIT Screen  What Year? 0 points  What month? 0 points  What time? 0 points  Count back from 20 0 points  Months in reverse 0 points  Repeat phrase 0 points  Total Score 0 points    Immunizations Immunization History  Administered Date(s) Administered   Hepatitis B, ADULT 07/25/2019, 08/25/2019   Influenza Split 07/28/2019   Influenza,inj,Quad PF,6+ Mos 07/25/2019   Influenza,inj,quad, With Preservative 07/25/2019   MMR 08/15/2016, 07/25/2019, 08/25/2019   PPD Test 07/25/2019, 08/25/2019   Tdap 08/14/2016, 07/25/2019, 11/13/2022   Varicella 09/11/2019    TDAP status: Up to date   Covid-19 vaccine status: Declined, Education has been provided regarding the importance of this vaccine but patient still declined. Advised may receive this vaccine at local pharmacy or Health Dept.or vaccine clinic. Aware to provide a copy of the vaccination record if obtained from local pharmacy or Health Dept. Verbalized acceptance and understanding.    Screening Tests Health Maintenance  Topic Date Due   COVID-19 Vaccine (1) Never done   Medicare Annual Wellness (AWV)  11/13/2024   Cervical Cancer Screening (HPV/Pap Cotest)  01/05/2028   DTaP/Tdap/Td (4 - Td or Tdap)  11/13/2032   Hepatitis C Screening  Completed   HIV Screening  Completed   HPV VACCINES  Aged Out   INFLUENZA VACCINE  Discontinued    Health Maintenance  Health Maintenance Due  Topic Date Due   COVID-19 Vaccine (1) Never done    Lung Cancer Screening: (Low Dose CT Chest recommended if Age 78-80 years, 20 pack-year currently smoking OR have quit w/in 15years.) does qualify.   Lung Cancer Screening Referral: Deferred  Additional Screening:  Hepatitis C Screening: does qualify; Completed 06/22/22  Vision Screening: Recommended annual ophthalmology exams for early detection of glaucoma and other disorders of the eye. Is the patient up to date with their annual eye exam?  Yes  Who is the provider or what is the name of the office in which the patient attends annual eye exams? Dr Mayford Knife If pt is  not established with a provider, would they like to be referred to a provider to establish care? No .   Dental Screening: Recommended annual dental exams for proper oral hygiene    Community Resource Referral / Chronic Care Management:  CRR required this visit?  No   CCM required this visit?  No     Plan:     I have personally reviewed and noted the following in the patient's chart:   Medical and social history Use of alcohol, tobacco or illicit drugs  Current medications and supplements including opioid prescriptions. Patient is not currently taking opioid prescriptions. Functional ability and status Nutritional status Physical activity Advanced directives List of other physicians Hospitalizations, surgeries, and ER visits in previous 12 months Vitals Screenings to include cognitive, depression, and falls Referrals and appointments  In addition, I have reviewed and discussed with patient certain preventive protocols, quality metrics, and best practice recommendations. A written personalized care plan for preventive services as well as general preventive health  recommendations were provided to patient.     Tillie Rung, LPN   84/12/[id number]   After Visit Summary: (MyChart) Due to this being a telephonic visit, the after visit summary with patients personalized plan was offered to patient via MyChart   Nurse Notes: Patient request f/u with concerns of increased anxiety and advised counseling.

## 2023-11-15 MED ORDER — ALPRAZOLAM 0.25 MG PO TABS: 0.2500 mg | ORAL_TABLET | Freq: Three times a day (TID) | ORAL | 0 refills | Status: DC | PRN

## 2023-11-16 ENCOUNTER — Ambulatory Visit: Payer: 59

## 2023-11-21 ENCOUNTER — Ambulatory Visit: Payer: 59 | Attending: Internal Medicine | Admitting: *Deleted

## 2023-11-21 DIAGNOSIS — M546 Pain in thoracic spine: Secondary | ICD-10-CM | POA: Insufficient documentation

## 2023-11-21 NOTE — Therapy (Signed)
OUTPATIENT PHYSICAL THERAPY THORACOLUMBAR EVALUATION   Patient Name: Mary [name] MRN: [id number] DOB:1986/10/17, 37 y.o., female Today's Date: 11/21/2023  END OF SESSION:  PT End of Session - 11/21/23 1016     Visit Number 2    Number of Visits 12    Date for PT Re-Evaluation 12/18/23    Authorization Type UHC    PT Start Time 1016    PT Stop Time 1102    PT Time Calculation (min) 46 min             Past Medical History:  Diagnosis Date   Asthma    Atypical squamous cell changes of undetermined significance (ASCUS) on cervical cytology with positive high risk human papilloma virus (HPV) 07/08/2013   Overview:   ASCUS with positive high risk HPV 07/08/13  Mild to moderate dysplasia on colpo 02/10/14   Plan for repeat colpo in 6 months (Due 08/13/14) schedulers flagged.  ASCUS with positive high risk HPV 07/08/13  Mild to moderate dysplasia on colpo 02/10/14   Plan for repeat colpo in 6 months (Due 08/13/14) schedulers flagged.   Bronchitis 01/31/2016   Chiari malformation type I (HCC)    Depression    GERD (gastroesophageal reflux disease)    H/O multiple concussions    Headache(784.0)    History of palpitations    Hx of fracture of nose    Mental disorder    Multiple fractures    Hx: of a leg and arm fracture as a child   Pneumonia    PONV (postoperative nausea and vomiting)    Post traumatic stress disorder (PTSD)    Pregnant 01/21/2016   PTSD (post-traumatic stress disorder)    Sinus infection 01/31/2016   Supervision of normal pregnancy, antepartum 02/08/2016    [facility] at             13+1 week FOB   [name] 37 yo WM Dating By  LMP and [place] Pap  02/08/16 GC/CT Initial:                36+wks: Genetic Screen NT/IT:  CF screen  Anatomic [place]  Flu vaccine  Tdap Recommended ~ 28wks Glucose Screen  2 hr GBS  Feed Preference  Contraception  Circumcision  Childbirth Classes  Pediatrician     Past Surgical History:  Procedure Laterality Date   botox  injections     colposcopy     DILATION AND CURETTAGE OF UTERUS     nexplanon     SUBOCCIPITAL CRANIECTOMY CERVICAL LAMINECTOMY N/A 12/16/2018   Procedure: Cervical one Laminectomy/Chiari decompression;  Surgeon: Donalee Citrin, MD;  Location: [facility] OR;  Service: Neurosurgery;  Laterality: N/A;   WISDOM TOOTH EXTRACTION     Patient Active Problem List   Diagnosis Date Noted   Atypical chest pain 09/25/2022   Migraine with aura and without status migrainosus 07/24/2022   History of posttraumatic stress disorder (PTSD) 04/25/2022   Vitamin D deficiency 09/11/2019   Hyperlipidemia 09/11/2019   Palpitations 07/30/2018   Chiari I malformation (HCC) 02/13/2018   Current smoker 04/20/2017   Asthma 06/04/2013   Depression 06/04/2013    REFERRING PROVIDER: Chaya Jan MD  REFERRING DIAG: Back pain.  Rationale for Evaluation and Treatment: Rehabilitation  THERAPY DIAG:  Pain in thoracic spine  ONSET DATE: Ongoing.  SUBJECTIVE:  SUBJECTIVE STATEMENT: The patient returns to PT (last seen on 09/13/23) with continued c/o mid-back pain but better than it was.  Doing okay this morning     PainPERTINENT HISTORY:  Chair I malformation and subsequent surgery. Please see above.   PAIN:  Are you having pain? Yes: NPRS scale: 2-3/10 Pain location: Mid-back. Pain description: Ache, sharp. Aggravating factors: As above. Relieving factors: Rest, heat and ice.  PRECAUTIONS: Chair I malformation   FALLS:  Has patient fallen in last 6 months? No  LIVING ENVIRONMENT: Lives with: lives with their family Lives in: House/apartment Has following equipment at home: None  OCCUPATION: Disabled.  She has been caring for her ill Mother recently.  PLOF: Independent  PATIENT GOALS: Would like to not have any  mid-back pain with activities.      OBJECTIVE:  Date 11-21-23                                    EXERCISE LOG  Exercise Repetitions and Resistance Comments  UBE 120 RPMs, x 5 mins   Rows Yellow 3x10   Shldr ext Yellow 3x10   Flexion  Bil. 2#DB  3x10 alternating UE's   Bil. ABD 1# 3x10   Bicep curl 4# 3x10   Tricep Push down XTS  blue  3x10    Blank cell = exercise not performed today    Note: Objective measures were completed at Evaluation unless otherwise noted.  POSTURE:  Right shoulder depression.  PALPATION: CC of pain is bilaterally from T3 to T8.  UE ROM:  WNL.   UPPER EXTREMITY MMT:    Bilateral shoulder flexion and extension is 4+/5.  GAIT: WNL.     PATIENT EDUCATION:  Education details:  Person educated:  International aid/development worker:  Education comprehension:   HOME EXERCISE PROGRAM:   ASSESSMENT:  CLINICAL IMPRESSION: The patient presents to OPPT with continued c/o mid-back pain.  Rx focused on  scapular strengthening and proprioception. Pt did well with reps of ten and performing several sets of each ex. And did well without flare-ups/ pain triggers in mid-back.    OBJECTIVE IMPAIRMENTS: decreased activity tolerance, decreased strength, increased muscle spasms, and pain.   ACTIVITY LIMITATIONS: carrying, lifting, and bending  PARTICIPATION LIMITATIONS: cleaning and laundry  PERSONAL FACTORS: Time since onset of injury/illness/exacerbation are also affecting patient's functional outcome.   REHAB POTENTIAL: Good  CLINICAL DECISION MAKING: Stable/uncomplicated  EVALUATION COMPLEXITY: Low   GOALS:  LONG TERM GOALS: Target date: 12/18/23  Ind with a HEP.  Goal status: INITIAL   2.  Perform ADL's with pain not > 1-2/10.  Goal status: INITIAL  3.  Bending and resume upright posture with pain not > 1-2/10. Baseline:  Goal status: INITIAL   PLAN:  PT FREQUENCY: 2x/week  PT DURATION: 6 weeks  PLANNED INTERVENTIONS: 97110-Therapeutic  exercises, 97530- Therapeutic activity, [id number]- Neuromuscular re-education, 97535- Self Care, 16109- Manual therapy, 97014- Electrical stimulation (unattended), 97035- Ultrasound, Patient/Family education, Cryotherapy, and Moist heat.  PLAN FOR NEXT SESSION: Scapular strengthening.   Thierno Hun,CHRIS, PTA 11/21/2023, 11:09 AM
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
## 2023-11-27 ENCOUNTER — Encounter: Payer: Self-pay | Admitting: Neurology

## 2023-11-27 ENCOUNTER — Ambulatory Visit (INDEPENDENT_AMBULATORY_CARE_PROVIDER_SITE_OTHER): Payer: 59 | Admitting: Neurology

## 2023-11-27 VITALS — BP 124/89 | HR 95 | Ht 64.0 in | Wt 168.0 lb

## 2023-11-27 DIAGNOSIS — G43109 Migraine with aura, not intractable, without status migrainosus: Secondary | ICD-10-CM

## 2023-11-27 DIAGNOSIS — G43809 Other migraine, not intractable, without status migrainosus: Secondary | ICD-10-CM | POA: Diagnosis not present

## 2023-11-27 DIAGNOSIS — G245 Blepharospasm: Secondary | ICD-10-CM | POA: Diagnosis not present

## 2023-11-27 MED ORDER — EMGALITY 120 MG/ML ~~LOC~~ SOAJ: 240.0000 mg | Freq: Once | SUBCUTANEOUS | 0 refills | Status: AC

## 2023-11-27 NOTE — Patient Instructions (Addendum)
Start Emgality - 2 injections for first dose, then 1 injection every 28 days thereafter.  Contact me when you pick up first dose so I can send in the standing order.  Make sure first dose has 2 injections Excedrin and Zofran as needed.  Limit use of pain relievers such as Excedrin to no more than 2 days out of week to prevent risk of rebound or medication-overuse headache. Follow up 5 months.

## 2023-11-29 ENCOUNTER — Telehealth: Payer: Self-pay

## 2023-11-29 ENCOUNTER — Encounter: Payer: Self-pay | Admitting: Neurology

## 2023-11-29 NOTE — Telephone Encounter (Signed)
ERROR

## 2023-12-03 NOTE — Telephone Encounter (Signed)
PA needed for Emgality.

## 2023-12-06 ENCOUNTER — Telehealth: Payer: Self-pay | Admitting: Pharmacy Technician

## 2023-12-06 ENCOUNTER — Other Ambulatory Visit (HOSPITAL_COMMUNITY): Payer: Self-pay

## 2023-12-06 NOTE — Telephone Encounter (Signed)
Pharmacy Patient Advocate Encounter   Received notification from Pt Calls Messages that prior authorization for Emgality 120MG /ML auto-injectors (migraine) is required/requested.   Insurance verification completed.   The patient is insured through [facility] .   Per test claim: PA required; PA submitted to above mentioned insurance via CoverMyMeds Key/confirmation #/EOC [facility] Status is pending

## 2023-12-06 NOTE — Telephone Encounter (Signed)
PA request has been Submitted. New Encounter created for follow up. For additional info see Pharmacy Prior Auth telephone encounter from 12/06/2023.

## 2023-12-11 ENCOUNTER — Encounter: Payer: Self-pay | Admitting: Internal Medicine

## 2023-12-15 ENCOUNTER — Other Ambulatory Visit: Payer: Self-pay | Admitting: Internal Medicine

## 2023-12-17 MED ORDER — ALPRAZOLAM 0.25 MG PO TABS: 0.2500 mg | ORAL_TABLET | Freq: Three times a day (TID) | ORAL | 0 refills | Status: DC | PRN

## 2023-12-19 ENCOUNTER — Ambulatory Visit: Payer: 59 | Admitting: Internal Medicine

## 2023-12-19 ENCOUNTER — Encounter: Payer: Self-pay | Admitting: Internal Medicine

## 2023-12-19 VITALS — BP 130/80 | HR 100 | Temp 97.9°F | Wt 170.5 lb

## 2023-12-19 DIAGNOSIS — M25551 Pain in right hip: Secondary | ICD-10-CM

## 2023-12-19 NOTE — Progress Notes (Signed)
 Established Patient Office Visit     CC/Reason for Visit: Right hip pain  HPI: Mary [name] is a 38 y.o. female who is coming in today for the above mentioned reasons.  This has been ongoing for about 2 years with flareups in between.  For the past 2 weeks she has noticed worsening of the right sided pain.  Pain is around the anterior side of her right hip.  Walking for long periods of time or climbing steps makes it worse.  No urinary symptoms bowel movements are regular.  She has been told with her GYN that she has slight thickening of her endometrial lining.   Past Medical/Surgical History: Past Medical History:  Diagnosis Date   Asthma    Atypical squamous cell changes of undetermined significance (ASCUS) on cervical cytology with positive high risk human papilloma virus (HPV) 07/08/2013   Overview:   ASCUS with positive high risk HPV 07/08/13  Mild to moderate dysplasia on colpo 02/10/14   Plan for repeat colpo in 6 months (Due 08/13/14) schedulers flagged.  ASCUS with positive high risk HPV 07/08/13  Mild to moderate dysplasia on colpo 02/10/14   Plan for repeat colpo in 6 months (Due 08/13/14) schedulers flagged.   Bronchitis 01/31/2016   Chiari malformation type I (HCC)    Depression    GERD (gastroesophageal reflux disease)    H/O multiple concussions    Headache(784.0)    History of palpitations    Hx of fracture of nose    Mental disorder    Multiple fractures    Hx: of a leg and arm fracture as a child   Pneumonia    PONV (postoperative nausea and vomiting)    Post traumatic stress disorder (PTSD)    Pregnant 01/21/2016   PTSD (post-traumatic stress disorder)    Sinus infection 01/31/2016   Supervision of normal pregnancy, antepartum 02/08/2016    [facility] at             13+1 week FOB   [name] Daily 38 yo WM Dating By  LMP and US  Pap  02/08/16 GC/CT Initial:                36+wks: Genetic Screen NT/IT:  CF screen  Anatomic US   Flu vaccine   Tdap Recommended ~ 28wks Glucose Screen  2 hr GBS  Feed Preference  Contraception  Circumcision  Childbirth Classes  Pediatrician      Past Surgical History:  Procedure Laterality Date   botox injections     colposcopy     DILATION AND CURETTAGE OF UTERUS     nexplanon     SUBOCCIPITAL CRANIECTOMY CERVICAL LAMINECTOMY N/A 12/16/2018   Procedure: Cervical one Laminectomy/Chiari decompression;  Surgeon: Onetha Kuba, MD;  Location: [facility] OR;  Service: Neurosurgery;  Laterality: N/A;   WISDOM TOOTH EXTRACTION      Social History:  reports that she has been smoking cigarettes. She has never used smokeless tobacco. She reports that she does not currently use alcohol. She reports that she does not use drugs.  Allergies: Allergies  Allergen Reactions   Codeine  Shortness Of Breath and Other (See Comments)    Can not take in liquid form but does not recall exactly what formulation she had Has taken vicodin and percocet without problem per pt   Influenza A (H1n1) Monovalent Vaccine Anaphylaxis   Sulfa Antibiotics Anaphylaxis and Itching   Egg-Derived Products    Peanut-Containing Drug Products  Robaxin  [Methocarbamol ] Other (See Comments)    Migraine    Family History:  Family History  Problem Relation Age of Onset   Cancer Mother 35       breast   Hypertension Mother    Depression Mother    Depression Father    Seizures Father    Cancer Maternal Grandmother        breast and cervical   Asthma Son    Asthma Son    Drug abuse Paternal Grandmother    Alcohol abuse Paternal Grandmother    Hypertension Other    Cancer Other      Current Outpatient Medications:    albuterol  (VENTOLIN  HFA) 108 (90 Base) MCG/ACT inhaler, Inhale 2 puffs into the lungs every 6 (six) hours as needed for wheezing or shortness of breath., Disp: 1 g, Rfl: 2   ALPRAZolam  (XANAX ) 0.25 MG tablet, Take 1 tablet (0.25 mg total) by mouth 3 (three) times daily as needed for anxiety., Disp: 90 tablet, Rfl: 0    aspirin-acetaminophen -caffeine  (EXCEDRIN MIGRAINE) 250-250-65 MG tablet, Take by mouth every 6 (six) hours as needed for headache., Disp: , Rfl:    cyclobenzaprine  (FLEXERIL ) 5 MG tablet, Take 1 tablet (5 mg total) by mouth 3 (three) times daily as needed for muscle spasms., Disp: 90 tablet, Rfl: 5   EPINEPHrine  (EPIPEN  2-PAK) 0.3 mg/0.3 mL IJ SOAJ injection, Inject 0.3 mg into the muscle as needed for anaphylaxis., Disp: 1 each, Rfl: 0   fluticasone  (FLONASE ) 50 MCG/ACT nasal spray, Place 1 spray into both nostrils daily as needed for allergies or rhinitis., Disp: , Rfl:    methylPREDNISolone  (MEDROL  DOSEPAK) 4 MG TBPK tablet, As directed, Disp: 21 tablet, Rfl: 0   ondansetron  (ZOFRAN -ODT) 4 MG disintegrating tablet, Take 1 tablet (4 mg total) by mouth every 8 (eight) hours as needed for nausea or vomiting., Disp: 30 tablet, Rfl: 5   Vitamin D , Ergocalciferol , (DRISDOL ) 1.25 MG (50000 UNIT) CAPS capsule, Take 1 capsule (50,000 Units total) by mouth every 7 (seven) days., Disp: 5 capsule, Rfl: 0  Review of Systems:  Negative unless indicated in HPI.   Physical Exam: Vitals:   12/19/23 1010  BP: 130/80  Pulse: 100  Temp: 97.9 F (36.6 C)  TempSrc: Oral  SpO2: 100%  Weight: 170 lb 8 oz (77.3 kg)    Body mass index is 29.27 kg/m.   Physical Exam Abdominal:     General: There is no distension.      Impression and Plan:  Right hip pain   -Likely hip flexor tendinitis.  She is already attending physical therapy and will discuss with her therapist some strengthening exercises for this.  Time spent:22 minutes reviewing chart, interviewing and examining patient and formulating plan of care.     Tully Theophilus Andrews, MD [facility]

## 2023-12-27 ENCOUNTER — Telehealth: Payer: Self-pay | Admitting: Internal Medicine

## 2023-12-27 NOTE — Telephone Encounter (Signed)
Copied from CRM [id number]. Topic: Clinical - Red Word Triage >> Dec 27, 2023  9:23 AM Marica Otter wrote: Red Word that prompted transfer to Nurse Triage: Patient called in stating she wanted to speak with Fleet Contras regarding her pre existing chest pains, patient states she was diagnosed with inflammation in chest walls. Patient denied being triaged does not want to talk to a triage nurse, states she's under a lot of stress since her mom passed recently which has made the condition worse. Patient insisted on speaking with Fleet Contras stating she knows and just wants to know should she have a refill of the methylPREDNISolone (MEDROL DOSEPAK) 4 MG TBPK tablet sent in to see if that helps since it did before or should she come in.   Patient declined triage. Requesting to speak directly to office staff regarding pre-existing condition.

## 2023-12-27 NOTE — Telephone Encounter (Signed)
Spoke with patient and an appointment Monday.

## 2023-12-31 ENCOUNTER — Encounter: Payer: Self-pay | Admitting: Family Medicine

## 2023-12-31 ENCOUNTER — Ambulatory Visit (INDEPENDENT_AMBULATORY_CARE_PROVIDER_SITE_OTHER): Payer: 59 | Admitting: Family Medicine

## 2023-12-31 VITALS — BP 102/78 | HR 87 | Temp 98.2°F | Wt 168.5 lb

## 2023-12-31 DIAGNOSIS — R42 Dizziness and giddiness: Secondary | ICD-10-CM

## 2023-12-31 DIAGNOSIS — M940 Chondrocostal junction syndrome [Tietze]: Secondary | ICD-10-CM

## 2023-12-31 MED ORDER — METHYLPREDNISOLONE 4 MG PO TBPK: ORAL_TABLET | ORAL | 0 refills | Status: DC

## 2023-12-31 NOTE — Progress Notes (Signed)
   Subjective:    Patient ID: Mary [name], female    DOB: 1986/03/02, 38 y.o.   MRN: [id number]  HPI Here for several issues. First she has had another bout of costochondritis. We saw her for this in October when she came in with a sharp pain in the chest, and she was tender to palpation. We gave her a Medrol dose pack, and this worked very well. Now for the past few days she has a similar pain across the chest which she describes as "burning". No cough or SOB. The other issue is vertigo, which she has dealt with on and off for the past 6 months. No headache. No hx of trauma. She saw her PCP, Dr. Ardyth Harps, for this and she was sent to vestibular PT. After a few sessions of this, the vertigo improved, but it has never gone away. She now asks fro a referral to ENT for this.    Review of Systems  Constitutional: Negative.   Respiratory: Negative.    Cardiovascular:  Positive for chest pain. Negative for palpitations and leg swelling.  Neurological:  Positive for dizziness. Negative for tremors, seizures, syncope, facial asymmetry, speech difficulty, weakness, light-headedness, numbness and headaches.       Objective:   Physical Exam Constitutional:      General: She is not in acute distress.    Appearance: Normal appearance.  Cardiovascular:     Rate and Rhythm: Normal rate and regular rhythm.     Pulses: Normal pulses.     Heart sounds: Normal heart sounds.  Pulmonary:     Effort: Pulmonary effort is normal.     Breath sounds: Normal breath sounds.     Comments: She is tender over the left sternal margin  Neurological:     General: No focal deficit present.     Mental Status: She is alert and oriented to person, place, and time.     Coordination: Coordination normal.     Gait: Gait normal.           Assessment & Plan:  For the costochondritis, we will treat with another Medrol dose pack. For the vertigo, we will refer her to ENT. Gershon Crane, MD

## 2024-01-07 ENCOUNTER — Ambulatory Visit: Payer: 59 | Admitting: Obstetrics and Gynecology

## 2024-01-10 ENCOUNTER — Ambulatory Visit: Payer: 59 | Admitting: Obstetrics and Gynecology

## 2024-01-10 ENCOUNTER — Encounter: Payer: Self-pay | Admitting: Obstetrics and Gynecology

## 2024-01-10 ENCOUNTER — Other Ambulatory Visit: Payer: Self-pay | Admitting: Obstetrics and Gynecology

## 2024-01-10 VITALS — BP 114/84 | HR 87 | Wt 169.5 lb

## 2024-01-10 DIAGNOSIS — Z3009 Encounter for other general counseling and advice on contraception: Secondary | ICD-10-CM

## 2024-01-10 DIAGNOSIS — Z1231 Encounter for screening mammogram for malignant neoplasm of breast: Secondary | ICD-10-CM

## 2024-01-10 DIAGNOSIS — Z975 Presence of (intrauterine) contraceptive device: Secondary | ICD-10-CM

## 2024-01-10 DIAGNOSIS — Z803 Family history of malignant neoplasm of breast: Secondary | ICD-10-CM | POA: Diagnosis not present

## 2024-01-10 DIAGNOSIS — Z3042 Encounter for surveillance of injectable contraceptive: Secondary | ICD-10-CM

## 2024-01-10 DIAGNOSIS — N939 Abnormal uterine and vaginal bleeding, unspecified: Secondary | ICD-10-CM

## 2024-01-10 DIAGNOSIS — F4321 Adjustment disorder with depressed mood: Secondary | ICD-10-CM

## 2024-01-10 DIAGNOSIS — N921 Excessive and frequent menstruation with irregular cycle: Secondary | ICD-10-CM | POA: Diagnosis not present

## 2024-01-10 MED ORDER — MEDROXYPROGESTERONE ACETATE 150 MG/ML IM SUSY
150.0000 mg | PREFILLED_SYRINGE | Freq: Once | INTRAMUSCULAR | Status: AC
Start: 2024-01-10 — End: 2024-01-10
  Administered 2024-01-10: 150 mg via INTRAMUSCULAR

## 2024-01-10 NOTE — Progress Notes (Signed)
GYNECOLOGY VISIT  Patient name: Mary [name] MRN [id number]  Date of birth: [birth year] Chief Complaint:   abnormal uterine bleeding  History:  Mary [name] is a 38 y.o. [id number] being seen today for AUB follow up.  Menses are long but not as heavy as they had been previously. Using ultra tampons for the bleeding with a linger in case of spill over. Since being on depo 15 years ago feels like menses have been worse.   Dealing with death of mother on February 16, 2024 and it was faster  Mom had breast and salivary(?) gland cancer (things this was from radiation).   Would be ok with IUD removal and switch to pills  feels ok with pills daily. Takes pills dialy (xanax)   Uses tobacco/cigarettes - CHC contraindication   Has been having LLQ pain intermittently prior to IUD insertion and concerned it could be related to prior HPV  Has previously used nexplanon but scared it broke in her arm. Had depo and liked that she had depo - a little hromone (was with ex husband).   Hx of HPV and need biopsy x2 and then had no evidence of HPV on her biopsy and has questions regarding that.   Past Medical History:  Diagnosis Date   Asthma    Atypical squamous cell changes of undetermined significance (ASCUS) on cervical cytology with positive high risk human papilloma virus (HPV) 07/08/2013   Overview:   ASCUS with positive high risk HPV 07/08/13  Mild to moderate dysplasia on colpo 02/10/14   Plan for repeat colpo in 6 months (Due 08/13/14) schedulers flagged.  ASCUS with positive high risk HPV 07/08/13  Mild to moderate dysplasia on colpo 02/10/14   Plan for repeat colpo in 6 months (Due 08/13/14) schedulers flagged.   Bronchitis 01/31/2016   Chiari malformation type I (HCC)    Depression    GERD (gastroesophageal reflux disease)    H/O multiple concussions    Headache(784.0)    History of palpitations    Hx of fracture of nose    Mental disorder    Multiple fractures    Hx: of a leg and arm fracture as a  child   Pneumonia    PONV (postoperative nausea and vomiting)    Post traumatic stress disorder (PTSD)    Pregnant 01/21/2016   PTSD (post-traumatic stress disorder)    Sinus infection 01/31/2016   Supervision of normal pregnancy, antepartum 02/08/2016    [facility]             13+1 week FOB   [name] 38 yo WM Dating By  LMP and [place] Pap  02/08/16 GC/CT Initial:                36+wks: Genetic Screen NT/IT:  CF screen  Anatomic [place]  Flu vaccine  Tdap Recommended ~ 28wks Glucose Screen  2 hr GBS  Feed Preference  Contraception  Circumcision  Childbirth Classes  Pediatrician      Past Surgical History:  Procedure Laterality Date   botox injections     colposcopy     DILATION AND CURETTAGE OF UTERUS     nexplanon     SUBOCCIPITAL CRANIECTOMY CERVICAL LAMINECTOMY N/A 12/16/2018   Procedure: Cervical one Laminectomy/Chiari decompression;  Surgeon: Donalee Citrin, MD;  Location: [facility];  Service: Neurosurgery;  Laterality: N/A;   WISDOM TOOTH EXTRACTION      The following portions of the patient's history were reviewed and updated as  appropriate: allergies, current medications, past family history, past medical history, past social history, past surgical history and problem list.   Health Maintenance:   Last pap     Component Value Date/Time   DIAGPAP  01/04/2023 1149    - Negative for intraepithelial lesion or malignancy (NILM)   DIAGPAP  03/31/2020 1128    - Negative for intraepithelial lesion or malignancy (NILM)   HPVHIGH Negative 01/04/2023 1149   HPVHIGH Negative 03/31/2020 1128   ADEQPAP  01/04/2023 1149    Satisfactory for evaluation; transformation zone component PRESENT.   ADEQPAP  03/31/2020 1128    Satisfactory for evaluation; transformation zone component ABSENT.    High Risk HPV: Positive  Adequacy:  Satisfactory for evaluation, transformation zone component PRESENT  Diagnosis:  Atypical squamous cells of undetermined significance  (ASC-US)  Last mammogram: none prior - requesting mammo due to mother being diagnosed around 40   Review of Systems:  Pertinent items are noted in HPI. Comprehensive review of systems was otherwise negative.   Objective:  Physical Exam BP 114/84   Pulse 87   Wt 169 lb 8 oz (76.9 kg)   LMP 12/19/2023   BMI 29.09 kg/m    Physical Exam Vitals and nursing note reviewed.  Constitutional:      Appearance: Normal appearance.  HENT:     Head: Normocephalic and atraumatic.  Pulmonary:     Effort: Pulmonary effort is normal.  Skin:    General: Skin is warm and dry.  Neurological:     General: No focal deficit present.     Mental Status: She is alert.  Psychiatric:        Mood and Affect: Mood normal.        Behavior: Behavior normal.        Thought Content: Thought content normal.        Judgment: Judgment normal.      Labs and Imaging  FINDINGS: Uterusanteverted, 9 x 7 x 5 cm. The endometrium up to 2.2 Cm thickness. Abnormal endometrial thickening was noted on the prior study. IUD appropriately positioned in the uterine cavity. There are no uterine masses.   Right ovary   Unremarkable, 2.8 x 2.4 x 3.1 cm.   Left ovary   Unremarkable, 2.4 x 2.2 x 1.4 cm.   Images of the adnexae demonstrated no masses or fluid collections.   IMPRESSION: 1. Thickening of the endometrium. Similar finding was present previously. Recommend gyn consultation. 2. IUD appropriately positioned. 3. No adnexal pathology.     Electronically Signed   By: Layla Maw M.D.   On: 07/20/2023 18:47       Assessment & Plan:   1. Abnormal uterine bleeding (AUB) (Primary) Persistent abnormal uterine bleeding and thickened EMS. Plan to return for EMB for further evaluation.   2. Family history of breast cancer - MM 3D SCREENING MAMMOGRAM BILATERAL BREAST; Future  3. Breakthrough bleeding associated with intrauterine device (IUD) Discussed options of IUD removal and insertion of new  IUD vs other contraceptive. CHCs contraindicated given tobacco use. After discussion of options and efficacy patient opts to resume depo as she did when she was younger and will return for IUD removal.   4. Encounter for counseling regarding contraception Reviewed all forms of birth control options available including abstinence; over the counter/barrier methods; hormonal contraceptive medication including pill, patch, ring, injection,contraceptive implant; hormonal and nonhormonal IUDs; permanent sterilization options including vasectomy and the various tubal sterilization modalities. Risks and benefits reviewed.  Questions were  answered.  Information was given to patient to review.   5. Grief Has grief provided and will reach out to primary if change in medications neede.   Routine preventative health maintenance measures emphasized.  Lorriane Shire, MD [facility], [facility]

## 2024-01-15 ENCOUNTER — Other Ambulatory Visit: Payer: Self-pay | Admitting: Internal Medicine

## 2024-01-16 ENCOUNTER — Ambulatory Visit (INDEPENDENT_AMBULATORY_CARE_PROVIDER_SITE_OTHER): Payer: 59 | Admitting: Internal Medicine

## 2024-01-16 ENCOUNTER — Encounter: Payer: Self-pay | Admitting: Internal Medicine

## 2024-01-16 ENCOUNTER — Ambulatory Visit: Payer: 59 | Admitting: Neurology

## 2024-01-16 VITALS — BP 124/80 | HR 94 | Temp 98.1°F | Ht 65.0 in | Wt 168.8 lb

## 2024-01-16 DIAGNOSIS — E785 Hyperlipidemia, unspecified: Secondary | ICD-10-CM | POA: Diagnosis not present

## 2024-01-16 DIAGNOSIS — F411 Generalized anxiety disorder: Secondary | ICD-10-CM | POA: Diagnosis not present

## 2024-01-16 DIAGNOSIS — F32A Depression, unspecified: Secondary | ICD-10-CM | POA: Diagnosis not present

## 2024-01-16 DIAGNOSIS — Z0000 Encounter for general adult medical examination without abnormal findings: Secondary | ICD-10-CM

## 2024-01-16 DIAGNOSIS — E559 Vitamin D deficiency, unspecified: Secondary | ICD-10-CM | POA: Diagnosis not present

## 2024-01-16 LAB — COMPREHENSIVE METABOLIC PANEL
ALT: 19 U/L (ref 0–35)
AST: 18 U/L (ref 0–37)
Albumin: 4.3 g/dL (ref 3.5–5.2)
Alkaline Phosphatase: 56 U/L (ref 39–117)
BUN: 14 mg/dL (ref 6–23)
CO2: 25 meq/L (ref 19–32)
Calcium: 9.2 mg/dL (ref 8.4–10.5)
Chloride: 104 meq/L (ref 96–112)
Creatinine, Ser: 0.8 mg/dL (ref 0.40–1.20)
GFR: 94.27 mL/min (ref >60.00)
Glucose, Bld: 79 mg/dL (ref 70–99)
Potassium: 4.2 meq/L (ref 3.5–5.1)
Sodium: 137 meq/L (ref 135–145)
Total Bilirubin: 0.4 mg/dL (ref 0.2–1.2)
Total Protein: 7.4 g/dL (ref 6.0–8.3)

## 2024-01-16 LAB — LIPID PANEL
Cholesterol: 169 mg/dL (ref 0–200)
HDL: 38.9 mg/dL — ABNORMAL LOW (ref >39.00)
LDL Cholesterol: 116 mg/dL — ABNORMAL HIGH (ref 0–99)
NonHDL: 129.77
Total CHOL/HDL Ratio: 4
Triglycerides: 68 mg/dL (ref 0.0–149.0)
VLDL: 13.6 mg/dL (ref 0.0–40.0)

## 2024-01-16 LAB — TSH: TSH: 1.52 u[IU]/mL (ref 0.35–5.50)

## 2024-01-16 LAB — CBC WITH DIFFERENTIAL/PLATELET
Basophils Absolute: 0.1 10*3/uL (ref 0.0–0.1)
Basophils Relative: 0.8 % (ref 0.0–3.0)
Eosinophils Absolute: 0.5 10*3/uL (ref 0.0–0.7)
Eosinophils Relative: 6.4 % — ABNORMAL HIGH (ref 0.0–5.0)
HCT: 41.8 % (ref 36.0–46.0)
Hemoglobin: 13.4 g/dL (ref 12.0–15.0)
Lymphocytes Relative: 29.2 % (ref 12.0–46.0)
Lymphs Abs: 2.1 10*3/uL (ref 0.7–4.0)
MCHC: 32 g/dL (ref 30.0–36.0)
MCV: 91.8 fL (ref 78.0–100.0)
Monocytes Absolute: 0.5 10*3/uL (ref 0.1–1.0)
Monocytes Relative: 6.7 % (ref 3.0–12.0)
Neutro Abs: 4.1 10*3/uL (ref 1.4–7.7)
Neutrophils Relative %: 56.9 % (ref 43.0–77.0)
Platelets: 258 10*3/uL (ref 150.0–400.0)
RBC: 4.56 Mil/uL (ref 3.87–5.11)
RDW: 14.8 % (ref 11.5–15.5)
WBC: 7.1 10*3/uL (ref 4.0–10.5)

## 2024-01-16 LAB — VITAMIN D 25 HYDROXY (VIT D DEFICIENCY, FRACTURES): VITD: 15.45 ng/mL — ABNORMAL LOW (ref 30.00–100.00)

## 2024-01-16 LAB — VITAMIN B12: Vitamin B-12: 355 pg/mL (ref 211–911)

## 2024-01-16 NOTE — Progress Notes (Signed)
 Established Patient Office Visit     CC/Reason for Visit: Annual preventive exam  HPI: Mary [name] is a 38 y.o. female who is coming in today for the above mentioned reasons. Past Medical History is significant for: Depression, anxiety, hyperlipidemia, vitamin D  deficiency.  Has had increased of anxiety and probably a component of PTSD after the passing of her mother.  She has routine eye and dental care.  She has routine GYN care.   Past Medical/Surgical History: Past Medical History:  Diagnosis Date   Asthma    Atypical squamous cell changes of undetermined significance (ASCUS) on cervical cytology with positive high risk human papilloma virus (HPV) 07/08/2013   Overview:   ASCUS with positive high risk HPV 07/08/13  Mild to moderate dysplasia on colpo 02/10/14   Plan for repeat colpo in 6 months (Due 08/13/14) schedulers flagged.  ASCUS with positive high risk HPV 07/08/13  Mild to moderate dysplasia on colpo 02/10/14   Plan for repeat colpo in 6 months (Due 08/13/14) schedulers flagged.   Bronchitis 01/31/2016   Chiari malformation type I (HCC)    Depression    GERD (gastroesophageal reflux disease)    H/O multiple concussions    Headache(784.0)    History of palpitations    Hx of fracture of nose    Mental disorder    Multiple fractures    Hx: of a leg and arm fracture as a child   Pneumonia    PONV (postoperative nausea and vomiting)    Post traumatic stress disorder (PTSD)    Pregnant 01/21/2016   PTSD (post-traumatic stress disorder)    Sinus infection 01/31/2016   Supervision of normal pregnancy, antepartum 02/08/2016    [facility]             13+1 week FOB   [name] Daily 38 yo WM Dating By  LMP and US  Pap  02/08/16 GC/CT Initial:                36+wks: Genetic Screen NT/IT:  CF screen  Anatomic US   Flu vaccine  Tdap Recommended ~ 28wks Glucose Screen  2 hr GBS  Feed Preference  Contraception  Circumcision  Childbirth Classes  Pediatrician       Past Surgical History:  Procedure Laterality Date   botox injections     colposcopy     DILATION AND CURETTAGE OF UTERUS     nexplanon     SUBOCCIPITAL CRANIECTOMY CERVICAL LAMINECTOMY N/A 12/16/2018   Procedure: Cervical one Laminectomy/Chiari decompression;  Surgeon: Onetha Kuba, MD;  Location: [facility] OR;  Service: Neurosurgery;  Laterality: N/A;   WISDOM TOOTH EXTRACTION      Social History:  reports that she has been smoking cigarettes. She has never used smokeless tobacco. She reports that she does not currently use alcohol. She reports that she does not use drugs.  Allergies: Allergies  Allergen Reactions   Codeine  Shortness Of Breath and Other (See Comments)    Can not take in liquid form but does not recall exactly what formulation she had Has taken vicodin and percocet without problem per pt   Haemophilus Influenzae Vaccines Anaphylaxis   Influenza A (H1n1) Monovalent Vaccine Anaphylaxis   Sulfa Antibiotics Anaphylaxis and Itching   Egg-Derived Products    Peanut-Containing Drug Products    Robaxin  [Methocarbamol ] Other (See Comments)    Migraine    Family History:  Family History  Problem Relation Age of Onset   Cancer Mother  47       breast   Hypertension Mother    Depression Mother    Depression Father    Seizures Father    Cancer Maternal Grandmother        breast and cervical   Asthma Son    Asthma Son    Drug abuse Paternal Grandmother    Alcohol abuse Paternal Grandmother    Hypertension Other    Cancer Other      Current Outpatient Medications:    albuterol  (VENTOLIN  HFA) 108 (90 Base) MCG/ACT inhaler, Inhale 2 puffs into the lungs every 6 (six) hours as needed for wheezing or shortness of breath., Disp: 1 g, Rfl: 2   ALPRAZolam  (XANAX ) 0.25 MG tablet, TAKE 1 TABLET BY MOUTH THREE TIMES DAILY AS NEEDED FOR ANXIETY, Disp: 90 tablet, Rfl: 0   aspirin-acetaminophen -caffeine  (EXCEDRIN MIGRAINE) 250-250-65 MG tablet, Take by mouth every 6 (six) hours as  needed for headache., Disp: , Rfl:    cyclobenzaprine  (FLEXERIL ) 5 MG tablet, Take 1 tablet (5 mg total) by mouth 3 (three) times daily as needed for muscle spasms., Disp: 90 tablet, Rfl: 5   EPINEPHrine  (EPIPEN  2-PAK) 0.3 mg/0.3 mL IJ SOAJ injection, Inject 0.3 mg into the muscle as needed for anaphylaxis., Disp: 1 each, Rfl: 0   fluticasone  (FLONASE ) 50 MCG/ACT nasal spray, Place 1 spray into both nostrils daily as needed for allergies or rhinitis., Disp: , Rfl:    IUD'S IU, by Intrauterine route. Liletta  52 mg, Disp: , Rfl:    ondansetron  (ZOFRAN -ODT) 4 MG disintegrating tablet, Take 1 tablet (4 mg total) by mouth every 8 (eight) hours as needed for nausea or vomiting., Disp: 30 tablet, Rfl: 5   Vitamin D , Ergocalciferol , (DRISDOL ) 1.25 MG (50000 UNIT) CAPS capsule, Take 1 capsule (50,000 Units total) by mouth every 7 (seven) days., Disp: 5 capsule, Rfl: 0   methylPREDNISolone  (MEDROL  DOSEPAK) 4 MG TBPK tablet, As directed (Patient not taking: Reported on 01/16/2024), Disp: 21 tablet, Rfl: 0  Review of Systems:  Negative unless indicated in HPI.   Physical Exam: Vitals:   01/16/24 0821  BP: 124/80  Pulse: 94  Temp: 98.1 F (36.7 C)  TempSrc: Oral  SpO2: 99%  Weight: 168 lb 12.8 oz (76.6 kg)  Height: 5' 5 (1.651 m)    Body mass index is 28.09 kg/m.   Physical Exam Vitals reviewed.  Constitutional:      General: She is not in acute distress.    Appearance: Normal appearance. She is not ill-appearing, toxic-appearing or diaphoretic.  HENT:     Head: Normocephalic.     Right Ear: Tympanic membrane, ear canal and external ear normal. There is no impacted cerumen.     Left Ear: Tympanic membrane, ear canal and external ear normal. There is no impacted cerumen.     Nose: Nose normal.     Mouth/Throat:     Mouth: Mucous membranes are moist.     Pharynx: Oropharynx is clear. No oropharyngeal exudate or posterior oropharyngeal erythema.  Eyes:     General: No scleral icterus.        Right eye: No discharge.        Left eye: No discharge.     Conjunctiva/sclera: Conjunctivae normal.     Pupils: Pupils are equal, round, and reactive to light.  Neck:     Vascular: No carotid bruit.  Cardiovascular:     Rate and Rhythm: Normal rate and regular rhythm.     Pulses: Normal pulses.  Heart sounds: Normal heart sounds.  Pulmonary:     Effort: Pulmonary effort is normal. No respiratory distress.     Breath sounds: Normal breath sounds.  Abdominal:     General: Abdomen is flat. Bowel sounds are normal.     Palpations: Abdomen is soft.  Musculoskeletal:        General: Normal range of motion.     Cervical back: Normal range of motion.  Skin:    General: Skin is warm and dry.  Neurological:     General: No focal deficit present.     Mental Status: She is alert and oriented to person, place, and time. Mental status is at baseline.  Psychiatric:        Mood and Affect: Mood normal.        Behavior: Behavior normal.        Thought Content: Thought content normal.        Judgment: Judgment normal.     Flowsheet Row Office Visit from [facility]/16/2024 in [facility] [place]  PHQ-9 Total Score 12        Impression and Plan:  Encounter for preventive health examination -     CBC with Differential/Platelet; Future -     Comprehensive metabolic panel; Future  Depression, unspecified depression type -     TSH; Future -     Vitamin B12; Future  GAD (generalized anxiety disorder)  Vitamin D  deficiency -     VITAMIN D  25 Hydroxy (Vit-D Deficiency, Fractures); Future  Hyperlipidemia, unspecified hyperlipidemia type -     Lipid panel; Future   -Recommend routine eye and dental care. -Healthy lifestyle discussed in detail. -Labs to be updated today. -Prostate cancer screening: N/A Health Maintenance  Topic Date Due   COVID-19 Vaccine (1) Never done   Pneumococcal Vaccination (1 of 2 - PCV) Never done   Medicare Annual Wellness Visit   11/13/2024   Pap with HPV screening  01/05/2028   DTaP/Tdap/Td vaccine (4 - Td or Tdap) 11/13/2032   Hepatitis C Screening  Completed   HIV Screening  Completed   HPV Vaccine  Aged Out   Flu Shot  Discontinued     -Does not do flu vaccines given severe allergy and egg allergies.  Declines COVID vaccination today.    Tully Theophilus Andrews, MD  Primary Care at [facility]

## 2024-01-17 ENCOUNTER — Other Ambulatory Visit: Payer: Self-pay | Admitting: Internal Medicine

## 2024-01-17 ENCOUNTER — Encounter: Payer: Self-pay | Admitting: Internal Medicine

## 2024-01-17 DIAGNOSIS — E559 Vitamin D deficiency, unspecified: Secondary | ICD-10-CM

## 2024-01-17 MED ORDER — VITAMIN D (ERGOCALCIFEROL) 1.25 MG (50000 UNIT) PO CAPS
50000.0000 [IU] | ORAL_CAPSULE | ORAL | 0 refills | Status: AC
Start: 2024-01-17 — End: ?

## 2024-01-23 ENCOUNTER — Ambulatory Visit: Payer: 59

## 2024-01-25 NOTE — Telephone Encounter (Signed)
Pharmacy Patient Advocate Encounter  Received notification from [facility] that Prior Authorization for Emgality 120MG /ML auto-injectors (migraine)  has been APPROVED from 12/11/2023 to 12/10/2024   PA #/Case ID/Reference #: [id number]

## 2024-01-29 ENCOUNTER — Encounter: Payer: Self-pay | Admitting: Internal Medicine

## 2024-01-29 DIAGNOSIS — F32A Depression, unspecified: Secondary | ICD-10-CM

## 2024-01-31 ENCOUNTER — Ambulatory Visit: Payer: 59 | Admitting: Obstetrics and Gynecology

## 2024-02-04 DIAGNOSIS — R42 Dizziness and giddiness: Secondary | ICD-10-CM | POA: Diagnosis not present

## 2024-02-04 DIAGNOSIS — H93293 Other abnormal auditory perceptions, bilateral: Secondary | ICD-10-CM | POA: Diagnosis not present

## 2024-02-04 DIAGNOSIS — G43109 Migraine with aura, not intractable, without status migrainosus: Secondary | ICD-10-CM | POA: Diagnosis not present

## 2024-02-04 DIAGNOSIS — M2669 Other specified disorders of temporomandibular joint: Secondary | ICD-10-CM | POA: Diagnosis not present

## 2024-02-04 DIAGNOSIS — J310 Chronic rhinitis: Secondary | ICD-10-CM | POA: Diagnosis not present

## 2024-02-04 DIAGNOSIS — H9313 Tinnitus, bilateral: Secondary | ICD-10-CM | POA: Diagnosis not present

## 2024-02-05 ENCOUNTER — Encounter: Payer: Self-pay | Admitting: Neurology

## 2024-02-06 ENCOUNTER — Ambulatory Visit: Payer: 59

## 2024-02-06 ENCOUNTER — Telehealth: Payer: Self-pay

## 2024-02-06 NOTE — Telephone Encounter (Signed)
 Per patient she never started the Emgality samples given last month.  Due to her mother passing all the stress from that she decided not to take it. But now the stress has her migraines have come back.   Patient want to know if she can go ahead and start the Samples she has at home. They are still good til 12/2024.    Patient also wanted to know if she can take it with the Depo Provrea ?

## 2024-02-07 ENCOUNTER — Other Ambulatory Visit: Payer: Self-pay | Admitting: Neurology

## 2024-02-07 MED ORDER — EMGALITY 120 MG/ML ~~LOC~~ SOAJ: 120.0000 mg | SUBCUTANEOUS | 11 refills | Status: DC

## 2024-02-07 NOTE — Telephone Encounter (Signed)
 Mychart message sent with Dr.jaffe note, Yes, she can go ahead and start it (the loading dose of 2 injections).  I sent in the prescription for the standing order (1 injection every 28 days) which she can start next month.  It should have been approved.

## 2024-02-08 ENCOUNTER — Ambulatory Visit: Payer: 59

## 2024-02-13 ENCOUNTER — Other Ambulatory Visit: Payer: Self-pay | Admitting: Internal Medicine

## 2024-02-14 ENCOUNTER — Ambulatory Visit (INDEPENDENT_AMBULATORY_CARE_PROVIDER_SITE_OTHER): Payer: 59 | Admitting: Psychology

## 2024-02-14 DIAGNOSIS — F411 Generalized anxiety disorder: Secondary | ICD-10-CM

## 2024-02-14 DIAGNOSIS — F32A Depression, unspecified: Secondary | ICD-10-CM | POA: Diagnosis not present

## 2024-02-14 MED ORDER — ALPRAZOLAM 0.25 MG PO TABS: 0.2500 mg | ORAL_TABLET | Freq: Three times a day (TID) | ORAL | 0 refills | Status: DC | PRN
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
## 2024-02-18 ENCOUNTER — Other Ambulatory Visit (HOSPITAL_COMMUNITY)
Admission: RE | Admit: 2024-02-18 | Discharge: 2024-02-18 | Disposition: A | Discharge status: Home / Self Care | Source: Ambulatory Visit | Attending: Obstetrics and Gynecology | Admitting: Obstetrics and Gynecology

## 2024-02-18 ENCOUNTER — Ambulatory Visit (INDEPENDENT_AMBULATORY_CARE_PROVIDER_SITE_OTHER): Payer: 59 | Admitting: Obstetrics and Gynecology

## 2024-02-18 ENCOUNTER — Other Ambulatory Visit: Payer: Self-pay

## 2024-02-18 VITALS — BP 121/90 | HR 98 | Wt 169.0 lb

## 2024-02-18 DIAGNOSIS — Z30432 Encounter for removal of intrauterine contraceptive device: Secondary | ICD-10-CM

## 2024-02-18 DIAGNOSIS — R9389 Abnormal findings on diagnostic imaging of other specified body structures: Secondary | ICD-10-CM | POA: Diagnosis not present

## 2024-02-18 DIAGNOSIS — Z1331 Encounter for screening for depression: Secondary | ICD-10-CM | POA: Diagnosis not present

## 2024-02-18 DIAGNOSIS — N939 Abnormal uterine and vaginal bleeding, unspecified: Secondary | ICD-10-CM

## 2024-02-18 NOTE — Progress Notes (Unsigned)
 GYNECOLOGY VISIT  Patient name: Mary [name] MRN [id number]  Date of birth: 03/23/1986 Chief Complaint:   abnormal uterine bleeding   History:  Mary [name] is a 38 y.o. [id number] being seen today for EMB and depo.  Since getting depo 1/30 has not had any more bleding and right sided abdominal pain has resolved. Overall doing much better  They are interested in vasectomy: [birth year]04[birth year] Mary [name]   Past Medical History:  Diagnosis Date   Asthma    Atypical squamous cell changes of undetermined significance (ASCUS) on cervical cytology with positive high risk human papilloma virus (HPV) 07/08/2013   Overview:   ASCUS with positive high risk HPV 07/08/13  Mild to moderate dysplasia on colpo 02/10/14   Plan for repeat colpo in 6 months (Due 08/13/14) schedulers flagged.  ASCUS with positive high risk HPV 07/08/13  Mild to moderate dysplasia on colpo 02/10/14   Plan for repeat colpo in 6 months (Due 08/13/14) schedulers flagged.   Bronchitis 01/31/2016   Chiari malformation type I (HCC)    Depression    GERD (gastroesophageal reflux disease)    H/O multiple concussions    Headache(784.0)    History of palpitations    Hx of fracture of nose    Mental disorder    Multiple fractures    Hx: of a leg and arm fracture as a child   Pneumonia    PONV (postoperative nausea and vomiting)    Post traumatic stress disorder (PTSD)    Pregnant 01/21/2016   PTSD (post-traumatic stress disorder)    Sinus infection 01/31/2016   Supervision of normal pregnancy, antepartum 02/08/2016    [facility] at             13+1 week FOB   Mary [name] 38 yo WM Dating By  LMP and [place] Pap  02/08/16 GC/CT Initial:                36+wks: Genetic Screen NT/IT:  CF screen  Anatomic [place]  Flu vaccine  Tdap Recommended ~ 28wks Glucose Screen  2 hr GBS  Feed Preference  Contraception  Circumcision  Childbirth Classes  Pediatrician      Past Surgical History:  Procedure Laterality Date   botox injections      colposcopy     DILATION AND CURETTAGE OF UTERUS     nexplanon     SUBOCCIPITAL CRANIECTOMY CERVICAL LAMINECTOMY N/A 12/16/2018   Procedure: Cervical one Laminectomy/Chiari decompression;  Surgeon: Donalee Citrin, MD;  Location: [facility] OR;  Service: Neurosurgery;  Laterality: N/A;   WISDOM TOOTH EXTRACTION      The following portions of the patient's history were reviewed and updated as appropriate: allergies, current medications, past family history, past medical history, past social history, past surgical history and problem list.   Health Maintenance:   Last pap     Component Value Date/Time   DIAGPAP  01/04/2023 1149    - Negative for intraepithelial lesion or malignancy (NILM)   DIAGPAP  03/31/2020 1128    - Negative for intraepithelial lesion or malignancy (NILM)   HPVHIGH Negative 01/04/2023 1149   HPVHIGH Negative 03/31/2020 1128   ADEQPAP  01/04/2023 1149    Satisfactory for evaluation; transformation zone component PRESENT.   ADEQPAP  03/31/2020 1128    Satisfactory for evaluation; transformation zone component ABSENT.    High Risk HPV: Positive  Adequacy:  Satisfactory for evaluation, transformation zone component PRESENT  Diagnosis:  Atypical squamous cells of  undetermined significance (ASC-US)  Last mammogram: n/a   Review of Systems:  Pertinent items are noted in HPI. Comprehensive review of systems was otherwise negative.   Objective:  Physical Exam BP (!) 121/90   Pulse 98   Wt 169 lb (76.7 kg)   BMI 28.12 kg/m    Physical Exam Vitals and nursing note reviewed. Exam conducted with a chaperone present.  Constitutional:      Appearance: Normal appearance.  HENT:     Head: Normocephalic and atraumatic.  Pulmonary:     Effort: Pulmonary effort is normal.     Breath sounds: Normal breath sounds.  Genitourinary:    General: Normal vulva.     Exam position: Lithotomy position.     Vagina: Normal.     Cervix: Normal.  Skin:    General: Skin is warm and  dry.  Neurological:     General: No focal deficit present.     Mental Status: She is alert.  Psychiatric:        Mood and Affect: Mood normal.        Behavior: Behavior normal.        Thought Content: Thought content normal.        Judgment: Judgment normal.      Labs and Imaging EXAM: ULTRASOUND OF PELVIS   TECHNIQUE: Transabdominal and transvaginalultrasound examination of the pelvis was performed including evaluation of the uterus, ovaries, adnexal regions, and pelvic cul-de-sac.   COMPARISON:  05/03/2023.   FINDINGS: Uterusanteverted, 9 x 7 x 5 cm. The endometrium up to 2.2 Cm thickness. Abnormal endometrial thickening was noted on the prior study. IUD appropriately positioned in the uterine cavity. There are no uterine masses.   Right ovary   Unremarkable, 2.8 x 2.4 x 3.1 cm.   Left ovary   Unremarkable, 2.4 x 2.2 x 1.4 cm.   Images of the adnexae demonstrated no masses or fluid collections.   IMPRESSION: 1. Thickening of the endometrium. Similar finding was present previously. Recommend gyn consultation. 2. IUD appropriately positioned. 3. No adnexal pathology.     Electronically Signed   By: Layla Maw M.D.   On: 07/20/2023 18:47  IUD Removal  Patient identified, informed consent performed, consent signed.  Patient was in the dorsal lithotomy position, normal external genitalia was noted.  A speculum was placed in the patient's vagina, normal discharge was noted, no lesions. The cervix was visualized, no lesions, no abnormal discharge.  The strings of the IUD were visualized, grasped and pulled using ring forceps. The IUD was removed in its entirety. . Patient tolerated the procedure well.    Patient will use depo for contraception and considering vasectomy.  Routine preventative health maintenance measures emphasized.       Assessment & Plan:   1. Abnormal uterine bleeding (AUB) (Primary) Significant improvement with initiation of depo. Will  continue with depo for bleeding management at this time. Follow up EMB results.  - Surgical pathology  2. Endometrial thickening on ultrasound Now s/p uncomplicated EMB given persistently thickened endometrium on pelvic ultrasound.  - Surgical pathology  Routine preventative health maintenance measures emphasized.  Lorriane Shire, MD [facility]imally In[facility] for [facility], [facility]

## 2024-02-19 ENCOUNTER — Ambulatory Visit (INDEPENDENT_AMBULATORY_CARE_PROVIDER_SITE_OTHER): Admitting: Psychology

## 2024-02-19 DIAGNOSIS — F32A Depression, unspecified: Secondary | ICD-10-CM | POA: Diagnosis not present

## 2024-02-19 DIAGNOSIS — F411 Generalized anxiety disorder: Secondary | ICD-10-CM | POA: Diagnosis not present
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
## 2024-02-20 ENCOUNTER — Encounter: Payer: Self-pay | Admitting: Obstetrics and Gynecology

## 2024-02-21 ENCOUNTER — Encounter: Payer: Self-pay | Admitting: Obstetrics and Gynecology

## 2024-02-21 LAB — SURGICAL PATHOLOGY

## 2024-02-22 ENCOUNTER — Ambulatory Visit: Payer: 59

## 2024-02-25 ENCOUNTER — Ambulatory Visit: Admitting: Psychology

## 2024-02-25 DIAGNOSIS — F32A Depression, unspecified: Secondary | ICD-10-CM

## 2024-02-25 DIAGNOSIS — F411 Generalized anxiety disorder: Secondary | ICD-10-CM

## 2024-02-25 NOTE — Progress Notes (Unsigned)
 [facility] Counselor/Therapist Progress Note  Patient ID: Mary [name], MRN: [id number]    Date: 02/25/24  Time Spent: 5:00 pm - 5:55 pm : 55 minutes  Treatment Type: Individual Therapy.  Reported Symptoms: Feeling nervous and on edge, not being able to stop worrying, trouble relaxing, being restless, feeling afraid as if something awful might happen, trouble with sleep, feeling like let self down, and trouble concentrating.   Mental Status Exam: Appearance:  Well Groomed     Behavior: Appropriate  Motor: Normal  Speech/Language:  Normal Rate  Affect: Appropriate  Mood: normal  Thought process: normal  Thought content:   WNL  Sensory/Perceptual disturbances:   WNL  Orientation: oriented to person, place, and time/date  Attention: Good  Concentration: Good  Memory: WNL  Fund of knowledge:  Good  Insight:   Good  Judgment:  Good  Impulse Control: Good   Risk Assessment: Danger to Self:  No Self-injurious Behavior: No Danger to Others: No Duty to Warn:no Physical Aggression / Violence:No  Access to Firearms a concern: No  Gang Involvement:No   Subjective:   Mary [name] participated from home, via video, and consented to treatment. I discussed the limitations of evaluation and management by telemedicine and the availability of in person appointments. The patient expressed understanding and agreed to proceed.  Therapist participated from office.  Mary [name] reviewed the events of the past week.   Patient interacted with a number of people this week that she didn't typically interact with on a weekly basis. Patient found the majority of the people very kind and helpful, but there was one person who "was rude" and that person's behavior negatively impacted the patient. Patient's son invited patient to a social event, and patient reminded son that she expected an apology from him. Patient's son apologized, but patient is questioning whether the apology was sincere or  not. Patient discussed her father's "disease" and shared her concerns about Mary [name] turning out like her father.   Interventions: Cognitive Behavioral Therapy  Diagnosis:  Depression, unspecified depression type F32A, GAD Generalized Anxiety Disorder 41.A   Psychiatric Treatment: Yes , via PCP  Treatment Plan:  Client Abilities/Strengths Mary [name] reports hopefulness, Self Advocacy, Ability to Communicate Effectively, and intelligence as her strengths   Support System: significant other, Mary [name], and ex husband: Mary Needle.  Client Treatment Preferences OPT  Client Statement of Needs Mary [name] would like to ***   Treatment Level Weekly/Biweekly  Symptoms  ***   (Status: {Symptom Status:26744}) ***   (Status: {Symptom Status:26744})  Goals:   Jahni experiences symptoms of depression and anxiety.   Target Date: 02/13/25 Frequency: Weekly/Biweekly  Progress: 0 Modality: individual    Therapist will provide referrals for additional resources as appropriate.  Therapist will provide psycho-education regarding Mary [name]'s diagnosis and corresponding treatment approaches and interventions. Mary [name] will support the patient's ability to achieve the goals identified. We will employ CBT, BA, Problem-solving, Solution Focused, Mindfulness,  coping skills, & other evidenced-based practices will be used to promote progress towards healthy functioning to help manage decrease symptoms associated with their diagnosis.   Reduce overall level, frequency, and intensity of the feelings of depression, anxiety and panic evidenced by decreased overall symptoms from 6 to 7 days/week to 0 to 1 days/week per client report for at least 3 consecutive months. Verbally express understanding of the relationship between feelings of depression and anxiety.  Verbalize an understanding of the role that distorted thinking plays in creating fears, excessive worry, and ruminations.  Mary [name] participated  in the creation  of the treatment plan)   Mary [name]

## 2024-03-05 ENCOUNTER — Encounter: Admitting: Psychology

## 2024-03-05 ENCOUNTER — Ambulatory Visit (HOSPITAL_COMMUNITY): Admitting: Registered Nurse

## 2024-03-05 ENCOUNTER — Encounter (HOSPITAL_COMMUNITY): Payer: Self-pay | Admitting: Registered Nurse

## 2024-03-05 DIAGNOSIS — F330 Major depressive disorder, recurrent, mild: Secondary | ICD-10-CM

## 2024-03-05 DIAGNOSIS — F411 Generalized anxiety disorder: Secondary | ICD-10-CM

## 2024-03-05 MED ORDER — HYDROXYZINE HCL 10 MG PO TABS: ORAL_TABLET | ORAL | 0 refills | Status: DC

## 2024-03-05 MED ORDER — HYDROXYZINE HCL 10 MG PO TABS
ORAL_TABLET | ORAL | 0 refills | Status: DC
Start: 2024-03-05 — End: 2024-04-01

## 2024-03-05 MED ORDER — DULOXETINE HCL 30 MG PO CPEP
30.0000 mg | ORAL_CAPSULE | Freq: Every day | ORAL | 0 refills | Status: DC
Start: 2024-03-05 — End: 2024-04-01

## 2024-03-05 MED ORDER — ALPRAZOLAM 0.25 MG PO TABS
ORAL_TABLET | ORAL | 0 refills | Status: DC
Start: 2024-03-05 — End: 2024-03-10

## 2024-03-05 MED ORDER — HYDROXYZINE HCL 10 MG PO TABS
ORAL_TABLET | ORAL | 0 refills | Status: DC
Start: 2024-03-05 — End: 2024-03-05

## 2024-03-05 NOTE — Patient Instructions (Addendum)
 No Labs were ordered.  Most recent 02/05/2024 by your primary provider.    Xanax taper:  Currently Prescribed Xanax 0.25 three times daily as needed.  You are currently taking it three times a day.   Week 1:  Take Xanax 0.25 mg twice daily Week 2:  Take Xanax 0.25 mg once daily Week 3:  Take Xanax 0.25 mg once every other day (ex. Sun, Tue, Thurs, Sat) Week 4:  Take Xanax 0.25 mg once every 3rd day (ex. Sun, Wed, Sat)  This will complete the taper. Enough medication was sent in to help complete taper.  Call 911, [phone], mobile crisis, or present to the nearest emergency room should you experience any suicidal/homicidal ideation, auditory/visual/hallucinations, or detrimental worsening of your mental health.  Mobile Crisis Response Teams Listed by counties in vicinity of [facility] providers [facility] [phone] [facility] [phone] [facility] [phone] [facility] [phone] [place]                * [facility] [phone]                * [facility] [phone]  [facility] [phone] [facility] [employer] [employer]  [phone] * [facility] [phone]

## 2024-03-05 NOTE — Progress Notes (Signed)
 This encounter was created in error - please disregard.

## 2024-03-05 NOTE — Progress Notes (Deleted)
                Mary [name]
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
## 2024-03-10 ENCOUNTER — Encounter (HOSPITAL_COMMUNITY): Payer: Self-pay | Admitting: Registered Nurse

## 2024-03-10 ENCOUNTER — Telehealth (HOSPITAL_COMMUNITY): Payer: Self-pay | Admitting: Registered Nurse

## 2024-03-10 ENCOUNTER — Other Ambulatory Visit (HOSPITAL_COMMUNITY): Payer: Self-pay | Admitting: Registered Nurse

## 2024-03-10 MED ORDER — ALPRAZOLAM 0.25 MG PO TABS: ORAL_TABLET | ORAL | 0 refills | Status: DC

## 2024-03-10 MED ORDER — GABAPENTIN 100 MG PO CAPS: 100.0000 mg | ORAL_CAPSULE | Freq: Three times a day (TID) | ORAL | 0 refills | Status: DC

## 2024-03-10 NOTE — Telephone Encounter (Signed)
 Patient called requesting call from provider [phone]) regarding medication issue.

## 2024-03-10 NOTE — Addendum Note (Signed)
 Addended by: Assunta Found B on: 03/10/2024 06:25 PM   Modules accepted: Orders

## 2024-03-10 NOTE — Progress Notes (Signed)
 Mary [name] asked provider to return call because she had questions about her medications.  During last visit 03/04/2024 patient reported that she did not start the Xanax taper related to having anxiety and panic attack and was instructed by her therapist and her primary care provider to continue the Xanax.  During visit she was instructed to contact her physician to have continue Xanax if she was not going to do the taper because this providers recommendation was to taper off Xanax, and start Cymbalta 30 mg daily, start Vistaril 10 mg - 20 mg 3 times daily as needed for anxiety.   Today Mary [name] reports she doesn't feel that it is the right time time to taper off of her Xanax related to "I am only 2 months into my grief after my mothers' passing and I'll be handling her estate up to June.  The last time we spoke as soon as I got off I had a panic attack.  I couldn't pick my son up from school."  She then states that she attempted to cut back on Xanax.  "The next day I only took one and then I didn't take one for the next 3 days but after 3 days I had to have one because of another panic attack.  She was instructed that the reason for the taper was to decrease the amount of Xanax in her system gradually instead of suddenly.  She was informed that the prescription for her Xanax taper was sent to the pharmacy.  And if she is going to do the taper she can pick up prescription.  She the states that she has done some research and talked to friend "Gabapentin is used to help with the detox of Xanax is there any way you can prescribe Gabapentin to help with the Xanax withdrawal.  Informed that Gabapentin could be prescribed to help with the withdrawal.  She states that she has not started the Cymbalta or the Vistaril.  "I never picked them up from the pharmacy.  I don't want to start another medication.  I want to get off as much medication as possible.  My doctor sent me to you because I told her I wanted to  come off of the Xanax. She was informed that it was still recommended to start the Cymbalta because it helps with anxiety, depression, and pain.  She states that she doesn't want to take it.  Informed that it was her decision.  Informed that the prescription for the Xanax taper would be the only one prescribed by this provider.  Gabapentin 100 mg Tid to assist with Xanax withdrawal is also a one time prescription.  She states she can't pick up prescription until 03/16/2024 for the Xanax.  Informed would write prescription for Gabapentin to be picked up at the same time.  Under standing voiced and she has agreed to recommendation/plan  Informed to keep scheduled visit for medication management follow up  03/19/2024.     Mary [name] B. Jamesha Ellsworth, NP

## 2024-03-12 ENCOUNTER — Ambulatory Visit (INDEPENDENT_AMBULATORY_CARE_PROVIDER_SITE_OTHER): Admitting: Psychology

## 2024-03-12 DIAGNOSIS — F411 Generalized anxiety disorder: Secondary | ICD-10-CM | POA: Diagnosis not present

## 2024-03-12 DIAGNOSIS — F32A Depression, unspecified: Secondary | ICD-10-CM

## 2024-03-12 NOTE — Progress Notes (Signed)
 [facility] Counselor/Therapist Progress Note  Patient ID: Mary [name], MRN: [id number]    Date: 03/12/24  Time Spent: 12:00 pm - 12:56 pm : 56  minutes  Treatment Type: Individual Therapy.  Reported Symptoms: Feeling nervous and on edge, not being able to stop worrying, trouble relaxing, being restless, feeling afraid as if something awful might happen, trouble with sleep, feeling like let self down, and trouble concentrating.   Mental Status Exam: Appearance:  Well Groomed     Behavior: Appropriate  Motor: Normal  Speech/Language:  Normal Rate  Affect: Appropriate  Mood: normal  Thought process: normal  Thought content:   WNL  Sensory/Perceptual disturbances:   WNL  Orientation: oriented to person, place, and time/date  Attention: Good  Concentration: Good  Memory: WNL  Fund of knowledge:  Good  Insight:   Good  Judgment:  Good  Impulse Control: Good   Risk Assessment: Danger to Self:  No Self-injurious Behavior: No Danger to Others: No Duty to Warn:no Physical Aggression / Violence:No  Access to Firearms a concern: No  Gang Involvement:No   Subjective:   Mary [name] participated from home, via video, and consented to treatment. I discussed the limitations of evaluation and management by telemedicine and the availability of in person appointments. The patient expressed understanding and agreed to proceed.  Therapist participated from home office.  Contessa reviewed the events of the past week.   Patient explained why she'd missed our appointment last week. That explanation centered around having to go to court, seeing a new psychiatrist, and feeling overwhelmed. Patient liked her new psychiatrist, and her new psychiatrist convinced patient not to stop taking her medication. Patient reported that she would like to get a job, but she is hesitant about taking that next step. Patient questioned whether working remote would make it easier for her to provide  care for Mary [name]. 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   Interventions: Cognitive Behavioral Therapy  Diagnosis:  Depression, unspecified depression type F32A, GAD Generalized Anxiety Disorder 41.A   Psychiatric Treatment: Yes , via PCP  Treatment Plan:  Client Abilities/Strengths Mary [name] reports hopefulness, Self Advocacy, Ability to Communicate Effectively, and intelligence as her strengths   Support System: significant other, Mary [name], and ex husband: Mary [name].   Client Treatment Preferences OPT  Client Statement of Needs Mary [name] would like to would like to learn techniques to decrease her anxiety, got back to work even if it's part-time, build her trust in Mary [name], cut back on smoking cigarettes, decrease her worrying, and learn how to focus more effectively.       Treatment Level Weekly/Biweekly  Symptoms  Anxiety: feeling nervous and on edge, not being able to stop worrying, trouble relaxing, being restless, feeling afraid as if something awful might happen (Status: maintained) Depression: trouble with sleep, feeling like let self down, and trouble concentrating. (Status: maintained)   Goals:   [name] experiences symptoms of depression and anxiety.   Target Date: 02/13/25 Frequency: Weekly/Biweekly  Progress: 0 Modality: individual    Therapist will provide referrals for additional resources as appropriate.  Therapist will provide psycho-education regarding Mary [name]'s diagnosis and corresponding treatment approaches and interventions. Mary [name] will support the patient's ability to achieve the goals identified. will employ CBT, BA, Problem-solving, Solution Focused, Mindfulness,  coping skills, & other evidenced-based practices will be used to promote progress towards healthy functioning to help manage decrease symptoms associated with their diagnosis.  Reduce overall level, frequency, and intensity of the  feelings of depression, anxiety and panic evidenced by decreased overall symptoms from 6 to 7 days/week to 0 to 1 days/week per client report for at least 3 consecutive months. Verbally express understanding of the relationship between feelings of depression and anxiety and their impact on thinking patterns and behaviors. Verbalize an understanding of the role that distorted thinking plays in creating fears, excessive worry, and ruminations.  Mary [name] participated in the creation of the treatment plan)    Mary [name]

## 2024-03-14 ENCOUNTER — Ambulatory Visit
Admission: RE | Admit: 2024-03-14 | Discharge: 2024-03-14 | Disposition: A | Discharge status: Home / Self Care | Source: Ambulatory Visit | Attending: Obstetrics and Gynecology | Admitting: Obstetrics and Gynecology

## 2024-03-14 DIAGNOSIS — Z803 Family history of malignant neoplasm of breast: Secondary | ICD-10-CM

## 2024-03-14 DIAGNOSIS — Z1231 Encounter for screening mammogram for malignant neoplasm of breast: Secondary | ICD-10-CM | POA: Diagnosis not present

## 2024-03-18 MED ORDER — ALPRAZOLAM 0.25 MG PO TABS: ORAL_TABLET | ORAL | 0 refills | Status: DC

## 2024-03-18 NOTE — Addendum Note (Signed)
 Addended by: Assunta Found B on: 03/18/2024 09:39 AM   Modules accepted: Orders

## 2024-03-19 ENCOUNTER — Encounter: Payer: Self-pay | Admitting: Obstetrics and Gynecology

## 2024-03-19 ENCOUNTER — Ambulatory Visit: Admitting: Psychology

## 2024-03-19 ENCOUNTER — Other Ambulatory Visit: Payer: Self-pay | Admitting: Obstetrics and Gynecology

## 2024-03-19 ENCOUNTER — Telehealth (HOSPITAL_COMMUNITY): Admitting: Registered Nurse

## 2024-03-19 DIAGNOSIS — R928 Other abnormal and inconclusive findings on diagnostic imaging of breast: Secondary | ICD-10-CM

## 2024-03-25 ENCOUNTER — Ambulatory Visit (INDEPENDENT_AMBULATORY_CARE_PROVIDER_SITE_OTHER): Admitting: Psychology

## 2024-03-25 DIAGNOSIS — F32A Depression, unspecified: Secondary | ICD-10-CM

## 2024-03-25 DIAGNOSIS — F411 Generalized anxiety disorder: Secondary | ICD-10-CM | POA: Diagnosis not present

## 2024-03-25 NOTE — Progress Notes (Signed)
 [facility] Counselor/Therapist Progress Note  Patient ID: Mary [name], MRN: [id number]    Date: 03/25/24  Time Spent: 1:00 pm - 1:59 pm : 59 minutes  Treatment Type: Individual Therapy.  Reported Symptoms:  Feeling nervous and on edge, not being able to stop worrying, trouble relaxing, being restless, feeling afraid as if something awful might happen, trouble with sleep, feeling like let self down, and trouble concentrating.     Mental Status Exam: Appearance:  Casual     Behavior: Appropriate  Motor: Normal  Speech/Language:  Normal Rate  Affect: Appropriate  Mood: normal  Thought process: normal  Thought content:   WNL  Sensory/Perceptual disturbances:   WNL  Orientation: oriented to person, place, and time/date  Attention: Good  Concentration: Good  Memory: WNL  Fund of knowledge:  Good  Insight:   Good  Judgment:  Good  Impulse Control: Good   Risk Assessment: Danger to Self:  No Self-injurious Behavior: No Danger to Others: No Duty to Warn:no Physical Aggression / Violence:No  Access to Firearms a concern: No  Gang Involvement:No   Subjective:   [name] participated from home, via video, and consented to treatment. I discussed the limitations of evaluation and management by telemedicine and the availability of in person appointments. The patient expressed understanding and agreed to proceed.  Therapist participated from office. [place] reviewed the events of the past week.   Patient was tearful following the news from [employer] party is a baby girl. Patient reported that she is "going through the painful stages of grief". Patient reported feeling shaky with her medication, and also reported that her hair is falling out. Patient discussed the loss of her mother and was looking forward to spending the next weekend, Easter weekend, [place] the beach. "That's where I feel closest to my deceased Mom".   Interventions: Cognitive Behavioral  Therapy  Diagnosis:  Depression, unspecified depression type F32A GAD Generalized Anxiety Disorder 41.A   Psychiatric Treatment: Yes , via PCP  Treatment Plan:  Client Abilities/Strengths  Jaterria reports hopefulness, Self Advocacy, Ability to Communicate Effectively, and intelligence as her strengths.  Support System: significant other, [name], and ex husband: [name]  Client Treatment Preferences OPT  Client Statement of Needs Rupal would like to learn techniques to decrease her anxiety, got back to work even if it's part-time, build her trust in [place], cut back on smoking cigarettes, decrease her worrying, and learn how to focus more effectively.     Treatment Level Weekly/Biweekly  Symptoms  Anxiety: feeling nervous and on edge, not being able to stop worrying, trouble relaxing, being restless, feeling afraid as if something awful might happen, Depression: trouble with sleep, feeling like let self down, and trouble concentrating.  Goals:   Neri experiences symptoms of depression and anxiety.   Target Date: 02/13/25 Frequency: Weekly/Biweekly  Progress: 0 Modality: individual    Therapist will provide referrals for additional resources as appropriate.  Therapist will provide psycho-education regarding Kennidee's diagnosis and corresponding treatment approaches and interventions. [name] will support the patient's ability to achieve the goals identified. will employ CBT, BA, Problem-solving, Solution Focused, Mindfulness,  coping skills, & other evidenced-based practices will be used to promote progress towards healthy functioning to help manage decrease symptoms associated with their diagnosis.   Reduce overall level, frequency, and intensity of the feelings of depression, anxiety and panic evidenced by decreased overall symptoms from 6 to 7 days/week to 0 to 1 days/week per client report for at  least 3 consecutive months. Verbally express understanding of the  relationship between feelings of depression and anxiety and their impact on thinking patterns and behaviors. Verbalize an understanding of the role that distorted thinking plays in creating fears, excessive worry, and ruminations.  [name] participated in the creation of the treatment plan)    [name]

## 2024-03-26 ENCOUNTER — Ambulatory Visit: Admitting: Psychology

## 2024-03-27 ENCOUNTER — Ambulatory Visit: Admitting: General Practice

## 2024-03-27 VITALS — BP 111/83 | HR 87

## 2024-03-27 DIAGNOSIS — Z3042 Encounter for surveillance of injectable contraceptive: Secondary | ICD-10-CM | POA: Diagnosis not present

## 2024-03-27 MED ORDER — MEDROXYPROGESTERONE ACETATE 150 MG/ML IM SUSY
150.0000 mg | PREFILLED_SYRINGE | Freq: Once | INTRAMUSCULAR | Status: AC
  Administered 2024-03-27: 150 mg via INTRAMUSCULAR

## 2024-03-27 NOTE — Progress Notes (Signed)
 Mary [name] here for Depo-Provera Injection. Injection administered without complication. Patient will return in 3 months for next injection between 7/3 and 7/17. Next annual visit due January 2026.   Doria Garden, RN 03/27/2024  11:21 AM

## 2024-04-01 ENCOUNTER — Telehealth (INDEPENDENT_AMBULATORY_CARE_PROVIDER_SITE_OTHER): Admitting: Registered Nurse

## 2024-04-01 ENCOUNTER — Encounter (HOSPITAL_COMMUNITY): Payer: Self-pay | Admitting: Registered Nurse

## 2024-04-01 ENCOUNTER — Ambulatory Visit
Admission: RE | Admit: 2024-04-01 | Discharge: 2024-04-01 | Disposition: A | Discharge status: Home / Self Care | Source: Ambulatory Visit | Attending: Obstetrics and Gynecology | Admitting: Obstetrics and Gynecology

## 2024-04-01 ENCOUNTER — Encounter: Payer: Self-pay | Admitting: Obstetrics and Gynecology

## 2024-04-01 DIAGNOSIS — G4700 Insomnia, unspecified: Secondary | ICD-10-CM | POA: Diagnosis not present

## 2024-04-01 DIAGNOSIS — R928 Other abnormal and inconclusive findings on diagnostic imaging of breast: Secondary | ICD-10-CM

## 2024-04-01 DIAGNOSIS — F330 Major depressive disorder, recurrent, mild: Secondary | ICD-10-CM | POA: Diagnosis not present

## 2024-04-01 DIAGNOSIS — F411 Generalized anxiety disorder: Secondary | ICD-10-CM

## 2024-04-01 MED ORDER — DULOXETINE HCL 30 MG PO CPEP: 30.0000 mg | ORAL_CAPSULE | Freq: Every day | ORAL | 0 refills | Status: DC

## 2024-04-01 MED ORDER — HYDROXYZINE HCL 10 MG PO TABS: ORAL_TABLET | ORAL | 0 refills | Status: DC

## 2024-04-01 MED ORDER — ALPRAZOLAM 0.25 MG PO TABS: 0.2500 mg | ORAL_TABLET | Freq: Every day | ORAL | 0 refills | Status: DC | PRN

## 2024-04-01 MED ORDER — GABAPENTIN 100 MG PO CAPS
100.0000 mg | ORAL_CAPSULE | Freq: Every day | ORAL | 0 refills | Status: DC
Start: 2024-04-01 — End: 2024-05-14

## 2024-04-01 NOTE — Progress Notes (Signed)
 BH MD/PA/NP OP Progress Note  04/01/2024 5:55 PM Mary [name]  MRN:  [id number]  Virtual Visit via Video Note  I connected with Mary [name] on 04/01/24 at  4:00 PM EDT by a video enabled telemedicine application and verified that I am speaking with the correct person using two identifiers.  Location: Patient: Home Provider: [facility], [name]   I discussed the limitations of evaluation and management by telemedicine and the availability of in person appointments. The patient expressed understanding and agreed to proceed.   I discussed the assessment and treatment plan with the patient. The patient was provided an opportunity to ask questions and all were answered. The patient agreed with the plan and demonstrated an understanding of the instructions.   The patient was advised to call back or seek an in-person evaluation if the symptoms worsen or if the condition fails to improve as anticipated.  I provided 30 minutes of non-face-to-face time during this encounter.   Humberto Magnus, NP   Chief Complaint: No chief complaint on file.  HPI: Mary [name] 38 y.o. female presents today for medication management follow up.  She is seen via virtual video visit by this provider, and chart reviewed on 04/01/24.  Her psychiatric history is significant for general anxiety, major depression, insomnia, and PTSD.  Her mental health is currently managed with Cymbalta  30 mg daily, Gabapentin  100 mg Tid, Hydroxyzine  10-20 mg Tid prn, and tapering off Xanax  0.25 mg.  She reports that she started the Xanax  taper and "when I got to the every other day part my body started convulsing, my heart beating really fast, everything started to turn black.  I had to take a Xanax  just to get it to stop.  She states that she doesn't feel that it is a good time for her to taper off of Xanax  because "it is a very stressful time for me.  My mother died, I'm having to handle her estate. I have to [place] up to the  [place] every weekend to pick up stuff and losing money.  I have to pay for a storage just to keep her stuff in.  I am under so much stress.  Could you just continue the Xanax  for a couple more months until June."  She also informs that she has not started the Cymbalta  "I was afraid to start it. I just don't want to be on a bunch of medicines.  All I need is the Xanax  for a couple more months."  She states that she did start the Gabapentin  "When I take it 3 times a day it makes my heart beat really fast and my blood pressure high.  But, if I just take it once a day at bed time it helps, and helps sleep to."  She then states that she was referred to this provider because "My primary doctor referred me to you so you could help me taper off and she suggested this after my mother died and everything was happening and my stress really high.   It was reiterated to patient that Xanax  used for short term use (situational events) that may cause more stress.  Usually prescribed no more than 1-3 months.  She has been taking it a year as of 02/2024.  She was to start taper after our initial visit in 03/05/2024 and a prescription for 50 tablets were prescribed.  Informed that this provider would not continue to write prescriptions for Xanax .  Instructed that she needed to start the  Cymbalta .  States she will start.   She states there has been no improvement in depress, anxiety, mood, or sleep.  But she has also been noncompliant with medications.  Today she denies suicidal/self-harm/homicidal ideation, psychosis, and paranoia.  Recommended the following:  Start Cymbalta  30 mg daily, Vistaril  10-20 mg Tid prn, Change Gabapentin  100 mg Q hs.  A prescription for Xanax  0.25 mg daily prn 15 tablets written.   Continue counseling/therapy.  She was informed that it usually takes a couple of weeks before notable improvements are seen but, would need to start taking to have improvement.  She voices understanding with information being  given to her today and is not in agreement with changes in Xanax  but voices her agreeable to recommendations.    Visit Diagnosis:    ICD-10-CM   1. GAD (generalized anxiety disorder)  F41.1 ALPRAZolam  (XANAX ) 0.25 MG tablet    hydrOXYzine  (ATARAX ) 10 MG tablet    DULoxetine  (CYMBALTA ) 30 MG capsule    gabapentin  (NEURONTIN ) 100 MG capsule    2. Major depressive disorder, recurrent episode, mild (HCC)  F33.0 DULoxetine  (CYMBALTA ) 30 MG capsule    3. Insomnia, unspecified type  G47.00 ALPRAZolam  (XANAX ) 0.25 MG tablet    hydrOXYzine  (ATARAX ) 10 MG tablet      Past Psychiatric History: Depression, anxiety, PTSD   Past Medical History:  Past Medical History:  Diagnosis Date   Asthma    Atypical squamous cell changes of undetermined significance (ASCUS) on cervical cytology with positive high risk human papilloma virus (HPV) 07/08/2013   Overview:   ASCUS with positive high risk HPV 07/08/13  Mild to moderate dysplasia on colpo 02/10/14   Plan for repeat colpo in 6 months (Due 08/13/14) schedulers flagged.  ASCUS with positive high risk HPV 07/08/13  Mild to moderate dysplasia on colpo 02/10/14   Plan for repeat colpo in 6 months (Due 08/13/14) schedulers flagged.   Bronchitis 01/31/2016   Chiari malformation type I (HCC)    Depression    GERD (gastroesophageal reflux disease)    H/O multiple concussions    Headache(784.0)    History of palpitations    Hx of fracture of nose    Mental disorder    Multiple fractures    Hx: of a leg and arm fracture as a child   Pneumonia    PONV (postoperative nausea and vomiting)    Post traumatic stress disorder (PTSD)    Pregnant 01/21/2016   PTSD (post-traumatic stress disorder)    Sinus infection 01/31/2016   Supervision of normal pregnancy, antepartum 02/08/2016    [facility] at             13+1 week FOB   [name] 38 yo WM Dating By  LMP and US  Pap  02/08/16 GC/CT Initial:                36+wks: Genetic Screen NT/IT:  CF  screen  Anatomic US   Flu vaccine  Tdap Recommended ~ 28wks Glucose Screen  2 hr GBS  Feed Preference  Contraception  Circumcision  Childbirth Classes  Pediatrician      Past Surgical History:  Procedure Laterality Date   botox injections     colposcopy     DILATION AND CURETTAGE OF UTERUS     nexplanon     SUBOCCIPITAL CRANIECTOMY CERVICAL LAMINECTOMY N/A 12/16/2018   Procedure: Cervical one Laminectomy/Chiari decompression;  Surgeon: Gearl Keens, MD;  Location: [facility];  Service: Neurosurgery;  Laterality:  N/A;   WISDOM TOOTH EXTRACTION      Family Psychiatric History: See below in family history  Family History:  Family History  Problem Relation Age of Onset   Breast cancer Mother        4s   Cancer Mother 52       breast   Hypertension Mother    Depression Mother    Depression Father    Seizures Father    Cancer Maternal Grandmother        breast and cervical   Drug abuse Paternal Grandmother    Alcohol abuse Paternal Grandmother    Breast cancer Cousin 34       paternal first cousin   Asthma Son    Asthma Son    Hypertension Other    Cancer Other     Social History:  Social History   Socioeconomic History   Marital status: Significant Other    Spouse name: Not on file   Number of children: 4   Years of education: Not on file   Highest education level: GED or equivalent  Occupational History   Occupation: disabled    Comment: PTSD  Tobacco Use   Smoking status: Some Days    Current packs/day: 0.50    Types: Cigarettes   Smokeless tobacco: Never   Tobacco comments:    NEVER USE SNUFF OR CHEWING TOBACCO  Vaping Use   Vaping status: Never Used  Substance and Sexual Activity   Alcohol use: Not Currently    Comment: social   Drug use: No   Sexual activity: Yes    Birth control/protection: None  Other Topics Concern   Not on file  Social History Narrative   Patient is right-handed. She is separated, has 4 sons. Is on disabilty for PTSD. She has one  caffeine  drink daily, walks 3 x week.home is one level   Social Drivers of Corporate investment banker Strain: Low Risk  (11/14/2023)   Overall Financial Resource Strain (CARDIA)    Difficulty of Paying Living Expenses: Not hard at all  Food Insecurity: Food Insecurity Present (02/19/2024)   Hunger Vital Sign    Worried About Running Out of Food in the Last Year: Sometimes true    Ran Out of Food in the Last Year: Never true  Transportation Needs: No Transportation Needs (02/19/2024)   PRAPARE - Administrator, Civil Service (Medical): No    Lack of Transportation (Non-Medical): No  Physical Activity: Inactive (11/14/2023)   Exercise Vital Sign    Days of Exercise per Week: 0 days    Minutes of Exercise per Session: 0 min  Stress: Stress Concern Present (11/14/2023)   [employer] of Occupational Health - Occupational Stress Questionnaire    Feeling of Stress : Rather much  Social Connections: Moderately Integrated (11/14/2023)   Social Connection and Isolation Panel [NHANES]    Frequency of Communication with Friends and Family: More than three times a week    Frequency of Social Gatherings with Friends and Family: More than three times a week    Attends Religious Services: More than 4 times per year    Active Member of [employer] or Organizations: Yes    Attends Engineer, structural: More than 4 times per year    Marital Status: Divorced    Allergies:  Allergies  Allergen Reactions   Codeine  Shortness Of Breath and Other (See Comments)    Can not take in liquid form but does not  recall exactly what formulation she had Has taken vicodin and percocet without problem per pt   Haemophilus Influenzae Vaccines Anaphylaxis   Influenza A (H1n1) Monovalent Vaccine Anaphylaxis   Sulfa Antibiotics Anaphylaxis and Itching   Egg-Derived Products    Peanut-Containing Drug Products    Robaxin  [Methocarbamol ] Other (See Comments)    Migraine    Metabolic Disorder  Labs: Lab Results  Component Value Date   HGBA1C 5.4 06/22/2022   No results found for: "PROLACTIN" Lab Results  Component Value Date   CHOL 169 01/16/2024   TRIG 68.0 01/16/2024   HDL 38.90 (L) 01/16/2024   CHOLHDL 4 01/16/2024   VLDL 13.6 01/16/2024   LDLCALC 116 (H) 01/16/2024   LDLCALC 122 (H) 07/07/2020   Lab Results  Component Value Date   TSH 1.52 01/16/2024   TSH 2.760 10/19/2022    Current Medications: Current Outpatient Medications  Medication Sig Dispense Refill   ALPRAZolam  (XANAX ) 0.25 MG tablet Take 1 tablet (0.25 mg total) by mouth daily as needed for anxiety. 15 tablet 0   gabapentin  (NEURONTIN ) 100 MG capsule Take 1 capsule (100 mg total) by mouth at bedtime. 30 capsule 0   albuterol  (VENTOLIN  HFA) 108 (90 Base) MCG/ACT inhaler Inhale 2 puffs into the lungs every 6 (six) hours as needed for wheezing or shortness of breath. 1 g 2   aspirin-acetaminophen -caffeine  (EXCEDRIN MIGRAINE) 250-250-65 MG tablet Take by mouth every 6 (six) hours as needed for headache.     cephALEXin  (KEFLEX ) 500 MG capsule Take 500 mg by mouth 3 (three) times daily.     chlorhexidine  (PERIDEX ) 0.12 % solution Use as directed 5 mLs in the mouth or throat 2 (two) times daily. 1-2     cyclobenzaprine  (FLEXERIL ) 5 MG tablet Take 1 tablet (5 mg total) by mouth 3 (three) times daily as needed for muscle spasms. 90 tablet 5   DULoxetine  (CYMBALTA ) 30 MG capsule Take 1 capsule (30 mg total) by mouth daily. 30 capsule 0   EPINEPHrine  (EPIPEN  2-PAK) 0.3 mg/0.3 mL IJ SOAJ injection Inject 0.3 mg into the muscle as needed for anaphylaxis. (Patient not taking: Reported on 02/18/2024) 1 each 0   fluticasone  (FLONASE ) 50 MCG/ACT nasal spray Place 1 spray into both nostrils daily as needed for allergies or rhinitis.     Galcanezumab -gnlm (EMGALITY ) 120 MG/ML SOAJ Inject 120 mg into the skin every 28 (twenty-eight) days. (Patient not taking: Reported on 02/18/2024) 1.12 mL 11   hydrOXYzine  (ATARAX ) 10 MG  tablet Take 10 mg - 20 mg (one to two tablets) three times daily as needed for anxiety or sleep 60 tablet 0   ibuprofen  (ADVIL ) 800 MG tablet Take 800 mg by mouth every 8 (eight) hours as needed.     ondansetron  (ZOFRAN -ODT) 4 MG disintegrating tablet Take 1 tablet (4 mg total) by mouth every 8 (eight) hours as needed for nausea or vomiting. 30 tablet 5   Vitamin D , Ergocalciferol , (DRISDOL ) 1.25 MG (50000 UNIT) CAPS capsule Take 1 capsule (50,000 Units total) by mouth every 7 (seven) days. 12 capsule 0   No current facility-administered medications for this visit.     Musculoskeletal: Strength & Muscle Tone:  Unable to assess via virtual visit Gait & Station:  Unable to assess via virtual visit Patient leans: N/A  Psychiatric Specialty Exam: Review of Systems  Constitutional:        No other complaints voiced  Psychiatric/Behavioral:  Positive for dysphoric mood and sleep disturbance. Negative for hallucinations, self-injury and suicidal  ideas. The patient is nervous/anxious.   All other systems reviewed and are negative.   not currently breastfeeding.There is no height or weight on file to calculate BMI.  General Appearance: Casual  Eye Contact:  Good  Speech:  Clear and Coherent and Normal Rate  Volume:  Normal  Mood:  Anxious and Depressed  Affect:  Depressed and Tearful  Thought Process:  Coherent, Goal Directed, Linear, and Descriptions of Associations: Intact  Orientation:  Full (Time, Place, and Person)  Thought Content: Logical   Suicidal Thoughts:  No  Homicidal Thoughts:  No  Memory:  Immediate;   Good Recent;   Good Remote;   Good  Judgement:  Intact  Insight:  Present  Psychomotor Activity:  Normal  Concentration:  Concentration: Good and Attention Span: Good  Recall:  Good  Fund of Knowledge: Good  Language: Good  Akathisia:  No  Handed:  Right  AIMS (if indicated): not done  Assets:  Communication Skills Desire for Improvement Financial  Resources/Insurance Housing Intimacy Leisure Time Social Support Transportation  ADL's:  Intact  Cognition: WNL  Sleep:  Fair   Screenings: AIMS    Flowsheet Row Office Visit from 03/05/2024 in [facility]  AIMS Total Score 0      GAD-7    Flowsheet Row Office Visit from 03/05/2024 in [facility] Procedure visit from 02/18/2024 in Center for [facility] at [facility] Office Visit from 01/16/2024 in [facility] [place] Office Visit from 12/19/2023 in [facility] [place] [facility] [place] Clinical Support from 06/15/2022 in Center for [facility]  Total GAD-7 Score 20 21 20 10 13       PHQ2-9    Flowsheet Row Office Visit from 03/05/2024 in [facility] Procedure visit from 02/18/2024 in Center for [facility] at [facility] Office Visit from 01/16/2024 in [facility] [place] [facility] [place] Office Visit from 12/19/2023 in [facility] [place] Clinical Support from 11/14/2023 in [facility] [place] [facility] [place]  PHQ-2 Total Score 0 0 1 0 0  PHQ-9 Total Score 12 8 12 3  --      Flowsheet Row Office Visit from 03/05/2024 in [facility] Admission (Discharged) from 11/27/2022 in [place] 5S Mother Baby Unit Admission (Discharged) from 09/28/2022 in [facility] 1S Maternity Assessment Unit  C-SSRS RISK CATEGORY No Risk No Risk No Risk        Assessment and Plan:  Assessment: Patient seen and examined as noted above. Summary: Today Anamae Rochelle continue to state no improvement in depression, anxiety, mood, or sleep.  But she has not been compliant with medication recommendations.  She has never started Cymbalta  or Vistaril .   Reporting can only tolerate Gabapentin  once daily related to an increase of blood pressure and palpitation if taken 3 times a day.  She feels that Xanax  is the only medication that will help with her Xanax  and has been reluctant to start any of the other medications.  She is informed that she will need to start other medications to see if they work because would not continue to prescribe the Xanax .  She denies suicidal/self-harm/homicidal ideation, psychosis, paranoia, and abnormal movements.      During visit she is dressed appropriate for age and weather.  She is seated comfortably  in view of camera with no noted distress other than being upset and crying related to wanting the Xanax  continued.  She is alert/oriented x 4, cooperative and mood is congruent with affect.  She spoke in a clear tone at moderate volume, and normal pace, with good eye contact.  Her thought process is coherent, relevant, and there is no indication that she is currently responding to internal/external stimuli or experiencing delusional thought content.    1. GAD (generalized anxiety disorder) (Primary) No improvement in depression, anxiety, mood or sleep but hasn't been compliant with medication regimen.  Xanax  taper started and during the last week of taper, restarted with daily use saying that it is the only medication that helps with anxiety.   - ALPRAZolam  (XANAX ) 0.25 MG tablet; Take 1 tablet (0.25 mg total) by mouth daily as needed for anxiety.  Dispense: 15 tablet; Refill: 0 - hydrOXYzine  (ATARAX ) 10 MG tablet; Take 10 mg - 20 mg (one to two tablets) three times daily as needed for anxiety or sleep  Dispense: 60 tablet; Refill: 0 - DULoxetine  (CYMBALTA ) 30 MG capsule; Take 1 capsule (30 mg total) by mouth daily.  Dispense: 30 capsule; Refill: 0 - gabapentin  (NEURONTIN ) 100 MG capsule; Take 1 capsule (100 mg total) by mouth at bedtime.  Dispense: 30 capsule; Refill: 0  2. Major depressive disorder, recurrent episode, mild  (HCC) No improvement in depression, anxiety, mood or sleep but hasn't been compliant with medication regimen.  Xanax  taper started and during the last week of taper, restarted with daily use saying that it is the only medication that helps with anxiety.  - DULoxetine  (CYMBALTA ) 30 MG capsule; Take 1 capsule (30 mg total) by mouth daily.  Dispense: 30 capsule; Refill: 0  3. Insomnia, unspecified type No improvement in depression, anxiety, mood or sleep but hasn't been compliant with medication regimen.  Xanax  taper started and during the last week of taper, restarted with daily use saying that it is the only medication that helps with anxiety.  - ALPRAZolam  (XANAX ) 0.25 MG tablet; Take 1 tablet (0.25 mg total) by mouth daily as needed for anxiety.  Dispense: 15 tablet; Refill: 0 - hydrOXYzine  (ATARAX ) 10 MG tablet; Take 10 mg - 20 mg (one to two tablets) three times daily as needed for anxiety or sleep  Dispense: 60 tablet; Refill: 0  Plan: Medications: Meds ordered this encounter  Medications   ALPRAZolam  (XANAX ) 0.25 MG tablet    Sig: Take 1 tablet (0.25 mg total) by mouth daily as needed for anxiety.    Dispense:  15 tablet    Refill:  0    Supervising Provider:   ARFEEN, SYED T [[id number]]   hydrOXYzine  (ATARAX ) 10 MG tablet    Sig: Take 10 mg - 20 mg (one to two tablets) three times daily as needed for anxiety or sleep    Dispense:  60 tablet    Refill:  0    Supervising Provider:   Eduard Grad T [[id number]]   DULoxetine  (CYMBALTA ) 30 MG capsule    Sig: Take 1 capsule (30 mg total) by mouth daily.    Dispense:  30 capsule    Refill:  0    Supervising Provider:   ARFEEN, SYED T [[id number]]   gabapentin  (NEURONTIN ) 100 MG capsule    Sig: Take 1 capsule (100 mg total) by mouth at bedtime.    Dispense:  30 capsule    Refill:  0    Supervising Provider:   ARFEEN, SYED  T [[id number]]    Labs:  Not indicated at this time  Other:  Continue counseling/therapy.   Floreine Kingdon is instructed to call 911,  [phone], mobile crisis, or present to the nearest emergency room should he experience any suicidal/homicidal ideation, auditory/visual/hallucinations, or detrimental worsening of her mental health condition.   Rosselyn Martha has participated in the development of this treatment plan and verbalized her agreement with plan as listed.  Follow Up: Return in  Call in the interim for any side-effects, decompensation, questions, or problems  Collaboration of Care: Collaboration of Care: Medication Management AEB Medication adjustment, and refills  Patient/Guardian was advised Release of Information must be obtained prior to any record release in order to collaborate their care with an outside provider. Patient/Guardian was advised if they have not already done so to contact the registration department to sign all necessary forms in order for us  to release information regarding their care.   Consent: Patient/Guardian gives verbal consent for treatment and assignment of benefits for services provided during this visit. Patient/Guardian expressed understanding and agreed to proceed.    Quamere Mussell, NP 04/01/2024, 5:55 PM

## 2024-04-02 ENCOUNTER — Telehealth (HOSPITAL_COMMUNITY): Admitting: Registered Nurse

## 2024-04-02 ENCOUNTER — Ambulatory Visit: Admitting: Psychology

## 2024-04-07 DIAGNOSIS — R42 Dizziness and giddiness: Secondary | ICD-10-CM | POA: Diagnosis not present

## 2024-04-07 DIAGNOSIS — G43809 Other migraine, not intractable, without status migrainosus: Secondary | ICD-10-CM | POA: Diagnosis not present

## 2024-04-08 ENCOUNTER — Ambulatory Visit: Admitting: Psychology

## 2024-04-08 ENCOUNTER — Telehealth (INDEPENDENT_AMBULATORY_CARE_PROVIDER_SITE_OTHER): Admitting: Registered Nurse

## 2024-04-08 ENCOUNTER — Encounter (HOSPITAL_COMMUNITY): Payer: Self-pay | Admitting: Registered Nurse

## 2024-04-08 DIAGNOSIS — F330 Major depressive disorder, recurrent, mild: Secondary | ICD-10-CM

## 2024-04-08 DIAGNOSIS — F411 Generalized anxiety disorder: Secondary | ICD-10-CM | POA: Diagnosis not present

## 2024-04-08 NOTE — Patient Instructions (Signed)
    Call 911, [phone], mobile crisis, or present to the nearest emergency room should you experience any suicidal/homicidal ideation, auditory/visual/hallucinations, or detrimental worsening of your mental health.  Mobile Crisis Response Teams Listed by counties in vicinity of [facility] providers [facility] [phone] [facility] [phone] [facility] [phone] [facility] [phone] [place]                * [facility] [phone]                * [employer] [phone]  [facility] [phone] [facility] [employer]  [phone] * [employer] [phone]

## 2024-04-08 NOTE — Progress Notes (Signed)
 BH MD/PA/NP OP Progress Note  04/08/2024 9:41 AM [name]  MRN:  [id number]  Virtual Visit via Video Note  I connected with Mary [name] on 04/08/24 at  8:30 AM EDT by a video enabled telemedicine application and verified that I am speaking with the correct person using two identifiers.  Location: Patient: Home Provider: [facility], [name]   I discussed the limitations of evaluation and management by telemedicine and the availability of in person appointments. The patient expressed understanding and agreed to proceed.   I discussed the assessment and treatment plan with the patient. The patient was provided an opportunity to ask questions and all were answered. The patient agreed with the plan and demonstrated an understanding of the instructions.   The patient was advised to call back or seek an in-person evaluation if the symptoms worsen or if the condition fails to improve as anticipated.  I provided 20 minutes of non-face-to-face time during this encounter.   Mary Magnus, NP   Chief Complaint:  Chief Complaint  Patient presents with  . Follow-up    Medication management   HPI: Mary [name] 38 y.o. female presents today for medication management follow up.  She is seen via virtual video visit by this provider, and chart reviewed on 04/08/24.  Her psychiatric history is significant for general anxiety, major depression, insomnia, and PTSD.  Her mental health is currently managed with Start Cymbalta  30 mg daily, Vistaril  10-20 mg Tid prn, Gabapentin  100 mg Q hs, and Xanax  0.25 mg daily prn 15 tablets to complete the taper.  Today she reports "I wanted to talk to you, I tried the Cymbalta  and Hydroxyzine  and they didn't work for me.  I didn't take any of the Xanax  during that time and the meds didn't work.  The next day I took the Hydroxyzine  first and I had hallucinations and it felt like I had pens sticking out of my right eye and I remembered that I had taken it  before and the same thing happened.  I had memory problems and palpitations.  The next day I took the Cymbalta  first and I was so drowsy and dizzy.  I was wondering if something else would work.  I have obligations that I need to take care of and I can't do it if drowsy or dizzy." She states that she was supposed to take her son to a soccer game yesterday but had to call her husband from work to do it because she was unable to drive.   This patient has tried multiple psychotropic medications in the past that hasn't worked for her related to adverse reactions.  She reports that her diagnosis of Chiari is what makes treating her depression or anxiety challenging.    Chiari malformation a condition where part of the brain bulges through the opening at the base of the skull which causes pressure on the brain and spinal cord.  Individuals can present with varying symptoms such as severe headaches, dizziness, balance problems, vision issues, and numbness or tingling in the extremities.  When treating anxiety or depression, antidepressants, specifically SSRIs and SNRIs can be considered but should be monitored for possible interactions with Chiari-related symptoms or medications.  Electroconvulsive therapy (ECT) can also be an option for depression in certain cases.  With individual where medications are ineffective or poorly tolerated ECT should be considered.   Mary [name] and this provider discussed different treatment options.  She is requesting to change to another physician that is  more familiar with Chiari.  She states that she would like to try a different medications and states that she is not asking for Xanax  but also wanted to correct provider when stated that she has been on Xanax  for a year.  She reports she has been taking Xanax  since 2022.    Today she denies suicidal/self-harm/homicidal ideation, psychosis, and paranoia.  Informed her I would speak with other psychiatrist in practice to see if I  could get her care transferred to one of them and she would be contacted when an appointment could be scheduled.  Recommendations for this patient is to have ECT or referred to inpatient treatment where she can be given the withdrawal protocol for benzodiazapine and assisted with a medication that is not going to cause adverse reaction related to her Chiari.  No changes in medication regiment made at this time will refer to another psychiatrist for treatment.  .        Visit Diagnosis:    ICD-10-CM   1. GAD (generalized anxiety disorder)  F41.1     2. Major depressive disorder, recurrent episode, mild (HCC)  F33.0        Past Psychiatric History: Depression, anxiety, PTSD   Past Medical History:  Past Medical History:  Diagnosis Date  . Asthma   . Atypical squamous cell changes of undetermined significance (ASCUS) on cervical cytology with positive high risk human papilloma virus (HPV) 07/08/2013   Overview:   ASCUS with positive high risk HPV 07/08/13  Mild to moderate dysplasia on colpo 02/10/14   Plan for repeat colpo in 6 months (Due 08/13/14) schedulers flagged.  ASCUS with positive high risk HPV 07/08/13  Mild to moderate dysplasia on colpo 02/10/14   Plan for repeat colpo in 6 months (Due 08/13/14) schedulers flagged.  . Bronchitis 01/31/2016  . Chiari malformation type I (HCC)   . Depression   . GERD (gastroesophageal reflux disease)   . H/O multiple concussions   . Headache(784.0)   . History of palpitations   . Hx of fracture of nose   . Mental disorder   . Multiple fractures    Hx: of a leg and arm fracture as a child  . Pneumonia   . PONV (postoperative nausea and vomiting)   . Post traumatic stress disorder (PTSD)   . Pregnant 01/21/2016  . PTSD (post-traumatic stress disorder)   . Sinus infection 01/31/2016  . Supervision of normal pregnancy, antepartum 02/08/2016    [facility] at             13+1 week FOB   Mary [name] 38 yo WM Dating By  LMP and  US  Pap  02/08/16 GC/CT Initial:                36+wks: Genetic Screen NT/IT:  CF screen  Anatomic US   Flu vaccine  Tdap Recommended ~ 28wks Glucose Screen  2 hr GBS  Feed Preference  Contraception  Circumcision  Childbirth Classes  Pediatrician      Past Surgical History:  Procedure Laterality Date  . botox injections    . colposcopy    . DILATION AND CURETTAGE OF UTERUS    . nexplanon    . SUBOCCIPITAL CRANIECTOMY CERVICAL LAMINECTOMY N/A 12/16/2018   Procedure: Cervical one Laminectomy/Chiari decompression;  Surgeon: Gearl Keens, MD;  Location: [facility] OR;  Service: Neurosurgery;  Laterality: N/A;  . WISDOM TOOTH EXTRACTION      Family Psychiatric History: See below in family history  Family History:  Family History  Problem Relation Age of Onset  . Breast cancer Mother        78s  . Cancer Mother 59       breast  . Hypertension Mother   . Depression Mother   . Depression Father   . Seizures Father   . Cancer Maternal Grandmother        breast and cervical  . Drug abuse Paternal Grandmother   . Alcohol abuse Paternal Grandmother   . Breast cancer Cousin 41       paternal first cousin  . Asthma Son   . Asthma Son   . Hypertension Other   . Cancer Other     Social History:  Social History   Socioeconomic History  . Marital status: Significant Other    Spouse name: Not on file  . Number of children: 4  . Years of education: Not on file  . Highest education level: GED or equivalent  Occupational History  . Occupation: disabled    Comment: PTSD  Tobacco Use  . Smoking status: Some Days    Current packs/day: 0.50    Types: Cigarettes  . Smokeless tobacco: Never  . Tobacco comments:    NEVER USE SNUFF OR CHEWING TOBACCO  Vaping Use  . Vaping status: Never Used  Substance and Sexual Activity  . Alcohol use: Not Currently    Comment: social  . Drug use: No  . Sexual activity: Yes    Birth control/protection: None  Other Topics Concern  . Not on file  Social History  Narrative   Patient is right-handed. She is separated, has 4 sons. Is on disabilty for PTSD. She has one caffeine  drink daily, walks 3 x week.home is one level   Social Drivers of Corporate investment banker Strain: Low Risk  (11/14/2023)   Overall Financial Resource Strain (CARDIA)   . Difficulty of Paying Living Expenses: Not hard at all  Food Insecurity: Food Insecurity Present (02/19/2024)   Hunger Vital Sign   . Worried About Programme researcher, broadcasting/film/video in the Last Year: Sometimes true   . Ran Out of Food in the Last Year: Never true  Transportation Needs: No Transportation Needs (02/19/2024)   PRAPARE - Transportation   . Lack of Transportation (Medical): No   . Lack of Transportation (Non-Medical): No  Physical Activity: Inactive (11/14/2023)   Exercise Vital Sign   . Days of Exercise per Week: 0 days   . Minutes of Exercise per Session: 0 min  Stress: Stress Concern Present (11/14/2023)   [employer] of Occupational Health - Occupational Stress Questionnaire   . Feeling of Stress : Rather much  Social Connections: Moderately Integrated (11/14/2023)   Social Connection and Isolation Panel [NHANES]   . Frequency of Communication with Friends and Family: More than three times a week   . Frequency of Social Gatherings with Friends and Family: More than three times a week   . Attends Religious Services: More than 4 times per year   . Active Member of Clubs or Organizations: Yes   . Attends Banker Meetings: More than 4 times per year   . Marital Status: Divorced    Allergies:  Allergies  Allergen Reactions  . Codeine  Shortness Of Breath and Other (See Comments)    Can not take in liquid form but does not recall exactly what formulation she had Has taken vicodin and percocet without problem per pt  . Haemophilus Influenzae Vaccines  Anaphylaxis  . Influenza A (H1n1) Monovalent Vaccine Anaphylaxis  . Sulfa Antibiotics Anaphylaxis and Itching  . Egg-Derived Products   .  Peanut-Containing Drug Products   . Robaxin  [Methocarbamol ] Other (See Comments)    Migraine    Metabolic Disorder Labs: Lab Results  Component Value Date   HGBA1C 5.4 06/22/2022   No results found for: "PROLACTIN" Lab Results  Component Value Date   CHOL 169 01/16/2024   TRIG 68.0 01/16/2024   HDL 38.90 (L) 01/16/2024   CHOLHDL 4 01/16/2024   VLDL 13.6 01/16/2024   LDLCALC 116 (H) 01/16/2024   LDLCALC 122 (H) 07/07/2020   Lab Results  Component Value Date   TSH 1.52 01/16/2024   TSH 2.760 10/19/2022    Current Medications: Current Outpatient Medications  Medication Sig Dispense Refill  . albuterol  (VENTOLIN  HFA) 108 (90 Base) MCG/ACT inhaler Inhale 2 puffs into the lungs every 6 (six) hours as needed for wheezing or shortness of breath. 1 g 2  . ALPRAZolam  (XANAX ) 0.25 MG tablet Take 1 tablet (0.25 mg total) by mouth daily as needed for anxiety. 15 tablet 0  . aspirin-acetaminophen -caffeine  (EXCEDRIN MIGRAINE) 250-250-65 MG tablet Take by mouth every 6 (six) hours as needed for headache.    . cephALEXin  (KEFLEX ) 500 MG capsule Take 500 mg by mouth 3 (three) times daily.    . chlorhexidine  (PERIDEX ) 0.12 % solution Use as directed 5 mLs in the mouth or throat 2 (two) times daily. 1-2    . cyclobenzaprine  (FLEXERIL ) 5 MG tablet Take 1 tablet (5 mg total) by mouth 3 (three) times daily as needed for muscle spasms. 90 tablet 5  . DULoxetine  (CYMBALTA ) 30 MG capsule Take 1 capsule (30 mg total) by mouth daily. 30 capsule 0  . EPINEPHrine  (EPIPEN  2-PAK) 0.3 mg/0.3 mL IJ SOAJ injection Inject 0.3 mg into the muscle as needed for anaphylaxis. (Patient not taking: Reported on 02/18/2024) 1 each 0  . fluticasone  (FLONASE ) 50 MCG/ACT nasal spray Place 1 spray into both nostrils daily as needed for allergies or rhinitis.    . gabapentin  (NEURONTIN ) 100 MG capsule Take 1 capsule (100 mg total) by mouth at bedtime. 30 capsule 0  . Galcanezumab -gnlm (EMGALITY ) 120 MG/ML SOAJ Inject 120 mg  into the skin every 28 (twenty-eight) days. (Patient not taking: Reported on 02/18/2024) 1.12 mL 11  . hydrOXYzine  (ATARAX ) 10 MG tablet Take 10 mg - 20 mg (one to two tablets) three times daily as needed for anxiety or sleep 60 tablet 0  . ibuprofen  (ADVIL ) 800 MG tablet Take 800 mg by mouth every 8 (eight) hours as needed.    . ondansetron  (ZOFRAN -ODT) 4 MG disintegrating tablet Take 1 tablet (4 mg total) by mouth every 8 (eight) hours as needed for nausea or vomiting. 30 tablet 5  . Vitamin D , Ergocalciferol , (DRISDOL ) 1.25 MG (50000 UNIT) CAPS capsule Take 1 capsule (50,000 Units total) by mouth every 7 (seven) days. 12 capsule 0   No current facility-administered medications for this visit.     Musculoskeletal: Strength & Muscle Tone:  Unable to assess via virtual visit Gait & Station:  Unable to assess via virtual visit Patient leans: N/A  Psychiatric Specialty Exam: Review of Systems  Constitutional:        No other complaints voiced  Psychiatric/Behavioral:  Positive for dysphoric mood and sleep disturbance. Negative for hallucinations, self-injury and suicidal ideas. The patient is nervous/anxious.        Complaints of adverse reaction to Cymbalta , and Hydroxyzine   All other systems reviewed and are negative.   not currently breastfeeding.There is no height or weight on file to calculate BMI.  General Appearance: Casual  Eye Contact:  Good  Speech:  Clear and Coherent and Normal Rate  Volume:  Normal  Mood:  Anxious and Depressed  Affect:  Congruent  Thought Process:  Coherent, Goal Directed, Linear, and Descriptions of Associations: Intact  Orientation:  Full (Time, Place, and Person)  Thought Content: Logical   Suicidal Thoughts:  No  Homicidal Thoughts:  No  Memory:  Immediate;   Good Recent;   Good Remote;   Good  Judgement:  Intact  Insight:  Present  Psychomotor Activity:  Normal  Concentration:  Concentration: Good and Attention Span: Good  Recall:  Good   Fund of Knowledge: Good  Language: Good  Akathisia:  No  Handed:  Right  AIMS (if indicated): not done  Assets:  Communication Skills Desire for Improvement Financial Resources/Insurance Housing Intimacy Leisure Time Social Support Transportation  ADL's:  Intact  Cognition: WNL  Sleep:  Fair   Screenings: AIMS    Flowsheet Row Office Visit from 03/05/2024 in [facility]  AIMS Total Score 0      GAD-7    Flowsheet Row Office Visit from 03/05/2024 in [facility] at [facility] Procedure visit from 02/18/2024 in Center for [facility] for Women Office Visit from 01/16/2024 in [facility] [place] [facility] [place] Office Visit from 12/19/2023 in [facility] [place] Clinical Support from 06/15/2022 in Center for [facility] for Women  Total GAD-7 Score 20 21 20 10 13       PHQ2-9    Flowsheet Row Office Visit from 03/05/2024 in [facility] Procedure visit from 02/18/2024 in [facility] Office Visit from 01/16/2024 in [facility] [place] Office Visit from 12/19/2023 in [facility] [facility] [place] Clinical Support from 11/14/2023 in [facility] [place] [facility] [place]  PHQ-2 Total Score 0 0 1 0 0  PHQ-9 Total Score 12 8 12 3  --      Flowsheet Row Office Visit from 03/05/2024 in [facility] [place] [facility] (Discharged) from 11/27/2022 in [name] 5S Mother Baby Unit Admission (Discharged) from 09/28/2022 in [facility] 1S Maternity Assessment Unit  C-SSRS RISK CATEGORY No Risk No Risk No Risk        Assessment and Plan:  Assessment: Patient seen and examined as noted above. Summary: Today Somalia Spizzirri reports  current medication regimen is not working for her related to the adverse reactions.  She is wanting to see another provider and is requesting a transfer to someone who is more familiar with treating someone with Chiari.  Informed a transfer request would be entered.     Possible treatment options for this patient would be  ECT or referred to inpatient treatment where she can be given the withdrawal protocol for benzodiazapine and assisted with a medication that is not going to cause adverse reaction related to her Chiari.   Patient seen last week 04/01/24:  No refills written at this time.    04/01/24: 1. GAD (generalized anxiety disorder) (Primary) No improvement in depression, anxiety, mood or sleep but hasn't been compliant with medication regimen.  Xanax  taper started and during the last week of taper,  restarted with daily use saying that it is the only medication that helps with anxiety.   - ALPRAZolam  (XANAX ) 0.25 MG tablet; Take 1 tablet (0.25 mg total) by mouth daily as needed for anxiety.  Dispense: 15 tablet; Refill: 0 - hydrOXYzine  (ATARAX ) 10 MG tablet; Take 10 mg - 20 mg (one to two tablets) three times daily as needed for anxiety or sleep  Dispense: 60 tablet; Refill: 0 - DULoxetine  (CYMBALTA ) 30 MG capsule; Take 1 capsule (30 mg total) by mouth daily.  Dispense: 30 capsule; Refill: 0 - gabapentin  (NEURONTIN ) 100 MG capsule; Take 1 capsule (100 mg total) by mouth at bedtime.  Dispense: 30 capsule; Refill: 0  2. Major depressive disorder, recurrent episode, mild (HCC) No improvement in depression, anxiety, mood or sleep but hasn't been compliant with medication regimen.  Xanax  taper started and during the last week of taper, restarted with daily use saying that it is the only medication that helps with anxiety.  - DULoxetine  (CYMBALTA ) 30 MG capsule; Take 1 capsule (30 mg total) by mouth daily.  Dispense: 30 capsule; Refill: 0  3. Insomnia, unspecified type No improvement in depression,  anxiety, mood or sleep but hasn't been compliant with medication regimen.  Xanax  taper started and during the last week of taper, restarted with daily use saying that it is the only medication that helps with anxiety.  - ALPRAZolam  (XANAX ) 0.25 MG tablet; Take 1 tablet (0.25 mg total) by mouth daily as needed for anxiety.  Dispense: 15 tablet; Refill: 0 - hydrOXYzine  (ATARAX ) 10 MG tablet; Take 10 mg - 20 mg (one to two tablets) three times daily as needed for anxiety or sleep  Dispense: 60 tablet; Refill: 0  Plan: Medications:No changes in medication regimen at this time.    Labs:  Not indicated at this time  Other:  Continue counseling/therapy.   Dolores Osness is instructed to call 911, [phone], mobile crisis, or present to the nearest emergency room should he experience any suicidal/homicidal ideation, auditory/visual/hallucinations, or detrimental worsening of her mental health condition.   Marybeth Zumbrunnen has participated in the development of this treatment plan and verbalized her agreement with plan as listed.  Follow Up: Referral entered for transfer of services to another provider.  She will be called with appointment Call in the interim for any side-effects, decompensation, questions, or problems  Collaboration of Care: Collaboration of Care: Medication Management AEB Medication assessment  Patient/Guardian was advised Release of Information must be obtained prior to any record release in order to collaborate their care with an outside provider. Patient/Guardian was advised if they have not already done so to contact the registration department to sign all necessary forms in order for us  to release information regarding their care.   Consent: Patient/Guardian gives verbal consent for treatment and assignment of benefits for services provided during this visit. Patient/Guardian expressed understanding and agreed to proceed.    Maureena Dabbs, NP 04/08/2024, 9:41 AM

## 2024-04-09 ENCOUNTER — Ambulatory Visit: Admitting: Psychology

## 2024-04-09 ENCOUNTER — Telehealth: Payer: Self-pay | Admitting: Neurology

## 2024-04-09 ENCOUNTER — Ambulatory Visit (INDEPENDENT_AMBULATORY_CARE_PROVIDER_SITE_OTHER): Admitting: Psychology

## 2024-04-09 DIAGNOSIS — F330 Major depressive disorder, recurrent, mild: Secondary | ICD-10-CM | POA: Diagnosis not present

## 2024-04-09 DIAGNOSIS — F411 Generalized anxiety disorder: Secondary | ICD-10-CM

## 2024-04-09 NOTE — Telephone Encounter (Signed)
 Pt's mother passed away in January and she has been having issues with dealing with that. Her psychiatrist took her off her anxiety medication right after her mother passed away. She doesn't feel her psychiatrist knows how to treat her. She is wanting to see if Dr. Festus Hubert knows a psychiatrist that works with Chiari Malformation?  She also is wanting to see if Dr. Festus Hubert could take over her gabapentin  prescription?

## 2024-04-09 NOTE — Progress Notes (Signed)
 [facility] Counselor/Therapist Progress Note  Patient ID: Mary [name], MRN: [id number]    Date: 04/09/24  Time Spent: 4:12 pm - 4:40 pm  : 28 minutes   Treatment Type: Individual Therapy.  Reported Symptoms:  Feeling nervous and on edge, not being able to stop worrying, trouble relaxing, being restless, feeling afraid as if something awful might happen, trouble with sleep, feeling like let self down, and trouble concentrating.   Mental Status Exam Appearance:  Casual     Behavior: Appropriate  Motor: Normal  Speech/Language:  Normal Rate  Affect: Appropriate  Mood: normal  Thought process: normal  Thought content:   WNL  Sensory/Perceptual disturbances:   WNL  Orientation: oriented to person, place, and time/date  Attention: Good  Concentration: Good  Memory: WNL  Fund of knowledge:  Good  Insight:   Good  Judgment:  Good  Impulse Control: Good   Risk Assessment: Danger to Self:  No Self-injurious Behavior: No Danger to Others: No Duty to Warn:no Physical Aggression / Violence:No  Access to Firearms a concern: No  Gang Involvement:No   Subjective:   [name] participated from home, via video, and consented to treatment. I discussed the limitations of evaluation and management by telemedicine and the availability of in person appointments. The patient expressed understanding and agreed to proceed.  Therapist participated from home office.  Shonnie reviewed the events of the past week.   Patient joined 12 minutes late and explained that she had joined 10 minutes early and had been waiting for the session to start. Then shortly before patient joined she re-read the "join the meeting now" and she "pressed the button to join" again. Patient felt like she was heard by her psychiatrist and by her neurologist's office. We discussed additional steps patient can take to "get her life back on track".   Interventions: Cognitive Behavioral Therapy  Diagnosis:   Major depressive disorder, recurrent episode, mild (HCC) [F33.0]  Generalized Anxiety Disorder (41.1)  Psychiatric Treatment: Yes , via psychiatrist   Treatment Plan:  Client Abilities/Strengths Shama reports hopefulness, Self Advocacy, Ability to Communicate Effectively, and intelligence as her strengths.   Support System: significant other, [name], and ex husband: [name]    Client Treatment Preferences OPT  Client Statement of Needs Kaliyha would like to  learn techniques to decrease her anxiety, got back to work even if it's part-time, build her trust in [place], cut back on smoking cigarettes, decrease her worrying, and learn how to focus more effectively.        Treatment Level Weekly/Biweekly  Symptoms  Anxiety: feeling nervous and on edge, not being able to stop worrying, trouble relaxing, being restless, feeling afraid as if something awful might happen, Depression: trouble with sleep, feeling like let self down, and trouble concentrating.   Goals:   Hideko experiences symptoms of depression and anxiety.   Target Date: 02/13/25 Frequency: Weekly  Progress: 0 Modality: individual    Therapist will provide referrals for additional resources as appropriate.  Therapist will provide psycho-education regarding Tysheena's diagnosis and corresponding treatment approaches and interventions. [name] will support the patient's ability to achieve the goals identified. will employ CBT, BA, Problem-solving, Solution Focused, Mindfulness,  coping skills, & other evidenced-based practices will be used to promote progress towards healthy functioning to help manage decrease symptoms associated with their diagnosis.   Reduce overall level, frequency, and intensity of the feelings of depression, anxiety and panic evidenced by decreased overall symptoms from 6 to 7 days/week  to 0 to 1 days/week per client report for at least 3 consecutive months. Verbally express understanding of the  relationship between feelings of depression, and anxiety and their impact on thinking patterns and behaviors. Verbalize an understanding of the role that distorted thinking plays in creating fears, excessive worry, and ruminations.  [name] participated in the creation of the treatment plan)    [name]

## 2024-04-09 NOTE — Telephone Encounter (Signed)
 Patient advised of Dr.Patel note, Please let pt know Dr. Festus Hubert is out this week.  We are not aware of any psychiatrists who also have experience with Chiari malformation.    Per patient she was given Gabapentin  for sleep and it helps.   Suggestion was given to her that Klonopin for the anxiety and dizziness. Patient wanted to know how Dr.Jaffe feels about this.   Patient states she will try the Emgality  as well this weekend.

## 2024-04-14 ENCOUNTER — Encounter: Payer: Self-pay | Admitting: Internal Medicine

## 2024-04-15 ENCOUNTER — Encounter: Payer: Self-pay | Admitting: Neurology

## 2024-04-15 NOTE — Telephone Encounter (Signed)
 Per Dr.Jaffe, I unfortunately will not be able to continue management for her sleep disorder.  If she is without a psychiatrist at this time, I defer to her PCP.  She was already taking Xanax  but Klonopin can be an option but I again would defer to psychiatry.

## 2024-04-16 ENCOUNTER — Ambulatory Visit: Admitting: Psychology

## 2024-04-16 NOTE — Telephone Encounter (Signed)
 Patient is calling in regarding        a referral she would like a call back

## 2024-04-16 NOTE — Telephone Encounter (Signed)
 Reached out to pt and pt is going to reach out to the physiatry office.

## 2024-04-16 NOTE — Telephone Encounter (Signed)
 Pt called back in requesting to speak with Sheena.

## 2024-04-16 NOTE — Telephone Encounter (Signed)
 Patient advised.

## 2024-04-21 ENCOUNTER — Ambulatory Visit (INDEPENDENT_AMBULATORY_CARE_PROVIDER_SITE_OTHER): Admitting: Psychology

## 2024-04-21 DIAGNOSIS — F411 Generalized anxiety disorder: Secondary | ICD-10-CM

## 2024-04-21 DIAGNOSIS — F330 Major depressive disorder, recurrent, mild: Secondary | ICD-10-CM | POA: Diagnosis not present

## 2024-04-21 NOTE — Progress Notes (Signed)
 [facility] Counselor/Therapist Progress Note  Patient ID: Mary [name], MRN: [id number]    Date: 04/21/24  Time Spent: 9:00 am--9:53  am  :53 minutes    Treatment Type: Individual Therapy.  Reported Symptoms: Feeling nervous and on edge, not being able to stop worrying, trouble relaxing, being restless, feeling afraid as if something awful might happen, trouble with sleep, feeling like let self down, and trouble concentrating.    Mental Status Exam: Appearance:  Casual     Behavior: Appropriate  Motor: Normal  Speech/Language:  Normal Rate  Affect: Appropriate  Mood: normal  Thought process: normal  Thought content:   WNL  Sensory/Perceptual disturbances:   WNL  Orientation: oriented to person, place, and time/date  Attention: Good  Concentration: Good  Memory: WNL  Fund of knowledge:  Good  Insight:   Good  Judgment:  Good  Impulse Control: Good   Risk Assessment: Danger to Self:  No Self-injurious Behavior: No Danger to Others: No Duty to Warn:no Physical Aggression / Violence:No  Access to Firearms a concern: No  Gang Involvement:No   Subjective:   Mary [name] participated from home, via video, and consented to treatment. I discussed the limitations of evaluation and management by telemedicine and the availability of in person appointments. The patient expressed understanding and agreed to proceed.  Therapist participated from home office.  Mary [name] reviewed the events of the past week.   Patient is waiting to hear back from [place], the admin in her psychiatrist's office, regarding a referral to another psychiatrist. Patient also shared her troubled relationship with her maternal grandmother in law. Patient's parent in laws are described by patient as "very supportive". Patient has a positive relationship with "ex-husband" Mary [name], but they never get to see him.    Interventions: Cognitive Behavioral Therapy  Diagnosis:  Major depressive disorder,  recurrent episode, mild (HCC) [F33.0]  Generalized Anxiety Disorder (41.1)  Psychiatric Treatment: Yes , via psychiatrist  Treatment Plan:  Client Abilities/Strengths Mary [name] reports hopefulness, Self Advocacy, Ability to Communicate Effectively, and intelligence as her strengths.   Support System: significant other, Mary [name], and ex husband: Mary [name]   Client Treatment Preferences OPT  Client Statement of Needs Mary [name] would like to would like to learn techniques to decrease her anxiety, got back to work even if it's part-time, build her trust in [place], cut back on smoking cigarettes, decrease her worrying, and learn how to focus more effectively.    Treatment Level Weekly  Symptoms  Anxiety: feeling nervous and on edge, not being able to stop worrying, trouble relaxing, being restless, feeling afraid as if something awful might happen, Depression: trouble with sleep, feeling like let self down, and trouble concentrating.  Goals:   Mary [name] experiences symptoms of depression and anxiety.   Target Date: 02/13/25 Frequency: Weekly  Progress: 0 Modality: individual    Therapist will provide referrals for additional resources as appropriate.  Therapist will provide psycho-education regarding Mary [name]'s diagnosis and corresponding treatment approaches and interventions. Mary [name] will support the patient's ability to achieve the goals identified. will employ CBT, BA, Problem-solving, Solution Focused, Mindfulness,  coping skills, & other evidenced-based practices will be used to promote progress towards healthy functioning to help manage decrease symptoms associated with their diagnosis.   Reduce overall level, frequency, and intensity of the feelings of depression, anxiety and panic evidenced by decreased overall symptoms from 6 to 7 days/week to 0 to 1 days/week per client report for at least 3 consecutive months. Verbally express understanding  of the relationship between feelings of  depression and anxiety and their impact on thinking patterns and behaviors. Verbalize an understanding of the role that distorted thinking plays in creating fears, excessive worry, and ruminations.  Mary [name] participated in the creation of the treatment plan)    Mary [name]

## 2024-04-22 ENCOUNTER — Encounter: Payer: Self-pay | Admitting: Internal Medicine

## 2024-04-22 ENCOUNTER — Ambulatory Visit: Admitting: Psychology

## 2024-04-22 MED ORDER — MONTELUKAST SODIUM 10 MG PO TABS: 10.0000 mg | ORAL_TABLET | Freq: Every day | ORAL | 1 refills | Status: AC

## 2024-04-23 ENCOUNTER — Encounter: Payer: Self-pay | Admitting: Neurology

## 2024-04-23 ENCOUNTER — Ambulatory Visit: Admitting: Psychology

## 2024-04-23 ENCOUNTER — Encounter (HOSPITAL_COMMUNITY): Payer: Self-pay | Admitting: Registered Nurse

## 2024-04-23 NOTE — Progress Notes (Signed)
 Mary [name] 38 y.o. female with a psychiatric history is significant for general anxiety, major depression, insomnia, and PTSD.  Referred to [facility] services for medication management and to assist with Xanax  taper.  Her mental health is currently managed with Start Cymbalta  30 mg daily, Vistaril  10-20 mg Tid prn, Gabapentin  100 mg Q hs, and Xanax  0.25 mg daily prn 15 more tablets have been provided to assist with completion of Xanax  taper and she has been informed that this provider will no longer prescribe Xanax .     This provider has tried multiple medications with Mary [name] and none of them seem to help/work with most there was an adverse reaction as to why she could not continue taking.  Then she was noncompliant, not starting medications and continuing to take the Xanax  after she should have started tapering off.  Xanax  is the only medication that she has stated that helps and continues to request refills.  She has been informed several times risk of continued benzodiazapine use. I've attempted to taper off of Xanax  several times during our visits and for the most part she as been non-compliant.  On last visit 04/08/24 she requested to see another provider.    Per last visit:  Mary [name] and this provider discussed different treatment options.  She is requesting to change to another physician that is more familiar with Chiari.    She states that she would like to try a different medications and states that she is not asking for Xanax  but also wanted to correct provider when it was stated that she has been on Xanax  for a year.  She reports she has been taking Xanax  since 2022.  There have been several recommendations for treatment of her depression and anxiety.  She was also given information on TMS, inpatient admission to assist with benzodiazepine (withdrawal protocol).  So far there has been no luck in finding a provider that is able to take her at this time.  Since she no  longer wants to this provider, she has been discharged from my services.  Resources were provided for her for outpatient psychiatric services for medication management, and other resources also provided.    A secure message was sent to primary care provider (Dr. Raeanne Bull) informing her that patient has been discharged from services at patients' request to see another provider (informing of above Also informed that could review patients visits information in EPIC chart review.    Also consulted with treatment team (supervising physician, social work, and counselors) for best approach.  If patient is unwilling to participate in recommended treatment and no longer wishes to see this provider, and no other provider has an opening for her, refer to other outpatient behavioral health facilities and give resources.  Inform PCP of why patient no longer receiving services.    Mary [name] has been discharged from my services.   Referral and resources given.  A prescription for current medications with one refill was given at her last visit on 04/01/2024.     Edvin Albus B. Sigourney Portillo, NP

## 2024-04-24 ENCOUNTER — Other Ambulatory Visit: Payer: Self-pay | Admitting: Neurology

## 2024-04-24 ENCOUNTER — Emergency Department (HOSPITAL_COMMUNITY)
Admission: EM | Admit: 2024-04-24 | Discharge: 2024-04-24 | Discharge status: Against Medical Advice | Attending: Emergency Medicine | Admitting: Emergency Medicine

## 2024-04-24 DIAGNOSIS — R42 Dizziness and giddiness: Secondary | ICD-10-CM | POA: Diagnosis not present

## 2024-04-24 DIAGNOSIS — Z5321 Procedure and treatment not carried out due to patient leaving prior to being seen by health care provider: Secondary | ICD-10-CM | POA: Diagnosis not present

## 2024-04-24 DIAGNOSIS — R55 Syncope and collapse: Secondary | ICD-10-CM | POA: Diagnosis not present

## 2024-04-24 LAB — CBC WITH DIFFERENTIAL/PLATELET
Abs Immature Granulocytes: 0.02 10*3/uL (ref 0.00–0.07)
Basophils Absolute: 0.1 10*3/uL (ref 0.0–0.1)
Basophils Relative: 1 %
Eosinophils Absolute: 0.3 10*3/uL (ref 0.0–0.5)
Eosinophils Relative: 3 %
HCT: 37.8 % (ref 36.0–46.0)
Hemoglobin: 11.5 g/dL — ABNORMAL LOW (ref 12.0–15.0)
Immature Granulocytes: 0 %
Lymphocytes Relative: 34 %
Lymphs Abs: 3.2 10*3/uL (ref 0.7–4.0)
MCH: 26.4 pg (ref 26.0–34.0)
MCHC: 30.4 g/dL (ref 30.0–36.0)
MCV: 86.9 fL (ref 80.0–100.0)
Monocytes Absolute: 0.6 10*3/uL (ref 0.1–1.0)
Monocytes Relative: 6 %
Neutro Abs: 5.3 10*3/uL (ref 1.7–7.7)
Neutrophils Relative %: 56 %
Platelets: 389 10*3/uL (ref 150–400)
RBC: 4.35 MIL/uL (ref 3.87–5.11)
RDW: 14.2 % (ref 11.5–15.5)
WBC: 9.5 10*3/uL (ref 4.0–10.5)
nRBC: 0 % (ref 0.0–0.2)

## 2024-04-24 LAB — COMPREHENSIVE METABOLIC PANEL WITH GFR
ALT: 20 U/L (ref 0–44)
AST: 21 U/L (ref 15–41)
Albumin: 3.9 g/dL (ref 3.5–5.0)
Alkaline Phosphatase: 58 U/L (ref 38–126)
Anion gap: 9 (ref 5–15)
BUN: 12 mg/dL (ref 6–20)
CO2: 23 mmol/L (ref 22–32)
Calcium: 9.1 mg/dL (ref 8.9–10.3)
Chloride: 106 mmol/L (ref 98–111)
Creatinine, Ser: 0.84 mg/dL (ref 0.44–1.00)
GFR, Estimated: >60 mL/min (ref >60)
Glucose, Bld: 99 mg/dL (ref 70–99)
Potassium: 3.9 mmol/L (ref 3.5–5.1)
Sodium: 138 mmol/L (ref 135–145)
Total Bilirubin: 0.4 mg/dL (ref 0.0–1.2)
Total Protein: 7.2 g/dL (ref 6.5–8.1)

## 2024-04-24 LAB — HCG, SERUM, QUALITATIVE: Preg, Serum: NEGATIVE

## 2024-04-24 MED ORDER — PREDNISONE 10 MG PO TABS: ORAL_TABLET | ORAL | 0 refills | Status: DC

## 2024-04-24 NOTE — ED Triage Notes (Signed)
 Pt states that she's had dizziness and near syncopal episodes over the last three weeks. Pt states she has hx of chiari malformation, vestibular migraines, and meningitis. Pt denies N/V, fever, falls.

## 2024-04-24 NOTE — ED Notes (Signed)
 Pt called multiple time. Not in lobby

## 2024-04-24 NOTE — ED Notes (Signed)
 Called x 2 for IV start for imaging - no answer while in lobby

## 2024-04-25 ENCOUNTER — Encounter: Payer: Self-pay | Admitting: Internal Medicine

## 2024-04-25 ENCOUNTER — Encounter: Payer: Self-pay | Admitting: Family Medicine

## 2024-04-25 ENCOUNTER — Ambulatory Visit (INDEPENDENT_AMBULATORY_CARE_PROVIDER_SITE_OTHER): Admitting: Family Medicine

## 2024-04-25 ENCOUNTER — Ambulatory Visit: Payer: Self-pay | Admitting: *Deleted

## 2024-04-25 VITALS — BP 128/80 | HR 91 | Resp 12 | Ht 65.0 in

## 2024-04-25 DIAGNOSIS — F411 Generalized anxiety disorder: Secondary | ICD-10-CM

## 2024-04-25 DIAGNOSIS — H8110 Benign paroxysmal vertigo, unspecified ear: Secondary | ICD-10-CM

## 2024-04-25 DIAGNOSIS — G43109 Migraine with aura, not intractable, without status migrainosus: Secondary | ICD-10-CM | POA: Diagnosis not present

## 2024-04-25 DIAGNOSIS — R002 Palpitations: Secondary | ICD-10-CM

## 2024-04-25 DIAGNOSIS — D509 Iron deficiency anemia, unspecified: Secondary | ICD-10-CM | POA: Diagnosis not present

## 2024-04-25 DIAGNOSIS — R110 Nausea: Secondary | ICD-10-CM | POA: Diagnosis not present

## 2024-04-25 LAB — IRON: Iron: 41 ug/dL — ABNORMAL LOW (ref 42–145)

## 2024-04-25 LAB — C-REACTIVE PROTEIN: CRP: <1 mg/dL (ref 0.5–20.0)

## 2024-04-25 MED ORDER — ONDANSETRON 4 MG PO TBDP: 4.0000 mg | ORAL_TABLET | Freq: Three times a day (TID) | ORAL | 0 refills | Status: AC | PRN

## 2024-04-25 MED ORDER — MECLIZINE HCL 25 MG PO TABS: 25.0000 mg | ORAL_TABLET | Freq: Three times a day (TID) | ORAL | 0 refills | Status: DC | PRN

## 2024-04-25 NOTE — Patient Instructions (Addendum)
 A few things to remember from today's visit:  Benign paroxysmal positional vertigo, unspecified laterality - Plan: Ambulatory referral to Physical Therapy, meclizine (ANTIVERT) 25 MG tablet, C-reactive protein  Nausea without vomiting - Plan: ondansetron  (ZOFRAN -ODT) 4 MG disintegrating tablet  Iron deficiency anemia, unspecified iron deficiency anemia type - Plan: Iron, Ferritin  Iron every other day, Ferrous sulfate  325 mg with Vit C. Arrange appt with Dr Ival Marines. Meclizine 25 mg 3 times per day for 5 days then as needed, you can not drive while on this medication, if you need tpo, take it at night.  If you need refills for medications you take chronically, please call your pharmacy. Do not use My Chart to request refills or for acute issues that need immediate attention. If you send a my chart message, it may take a few days to be addressed, specially if I am not in the office.  Please be sure medication list is accurate. If a new problem present, please set up appointment sooner than planned today.

## 2024-04-25 NOTE — Progress Notes (Signed)
 ACUTE VISIT Chief Complaint  Patient presents with   Dizziness    X 3 weeks ago, did change migraine medicine 2 weeks ago; had blood drawn yesterday and blood is low; also happens when she is hot   HPI: Ms.Mary [name] is a 38 y.o. female with a PMHx significant for migraine, asthma, Chiari I malformation s/p decompression, vitamin D  deficiency, HLD, anxiety, and depression, who is here today with her partner and child complaining of dizziness.   Patient complains of episodes of dizziness that have been worsening for three weeks. She has had similar episodes for many months. She was at an appointment with her eye doctor yesterday when she had a severe episode and was sent to the ED. She was not seen because she was unable to wait.  Dizziness This is a recurrent problem. The current episode started 1 to 4 weeks ago. The problem occurs intermittently. The problem has been waxing and waning. Associated symptoms include fatigue, myalgias and nausea. Pertinent negatives include no coughing, joint swelling, rash, sore throat, swollen glands, urinary symptoms, visual change or weakness. The symptoms are aggravated by bending and standing. She has tried rest for the symptoms.   She describes the dizziness as a spinning and lightheadedness sensation. Also endorses left-sided chest pain thought to be costochondritis,intermittent palpitations, feeling hot, chills, nausea when she eats, chronic right-sided more than left-side tinnitus, and decreased hearing. These symptoms have been intermittent for a while. She has taken Zofran  as needed for nausea and vomiting.  Dizziness starts around 8 am, when getting up,intermittent until she goes to sleep, and also happens at night when she lies down. Worse while driving and turning to the right.  Mentions that she recently had her migraine treatment changed to an Emgality  120 mg injection every 4 weeks. She believes this may be aggravating her dizziness.  The  dizziness had been helped with xanax , but she is no longer taking it. She feels like anxiety is getting worse and panic attacks trigger by dizzy spells.  She is not longer seeing psychiatrist, who according to pt, recommended hydroxyzine  but did not continue because she felt too "sedated."  She states that she has been dx'ed with vestibular migraines dx'ed by Oscar Blazing AuD.  She has tried OTC meclizine  5-6 times, ginger and lemon tea, Divertigo, and chewing gum.   She has also seen ENT and neurology.  States that Prednisone  has helped with dizziness in the apst and recently prescribed by her neurologist but has not started.  Her next appointment with neurology (Dr. Festus Hubert) is in 06/2024.  She also mentions that she has been referred to vestibular therapy but did not start it because it was recommended to see ENT first.  She went to the ED on 04/24/24, did not wait to be seen, had blood work done. Concerned about mild anemia.  Lab Results  Component Value Date   NA 138 04/24/2024   CL 106 04/24/2024   K 3.9 04/24/2024   CO2 23 04/24/2024   BUN 12 04/24/2024   CREATININE 0.84 04/24/2024   GFRNONAA >60 04/24/2024   CALCIUM 9.1 04/24/2024   ALBUMIN 3.9 04/24/2024   GLUCOSE 99 04/24/2024   Lab Results  Component Value Date   WBC 9.5 04/24/2024   HGB 11.5 (L) 04/24/2024   HCT 37.8 04/24/2024   MCV 86.9 04/24/2024   PLT 389 04/24/2024   Review of Systems  Constitutional:  Positive for fatigue.  HENT:  Negative for mouth sores, sore throat  and trouble swallowing.   Eyes:  Negative for discharge and redness.  Respiratory:  Negative for cough and wheezing.   Cardiovascular:  Negative for leg swelling.  Gastrointestinal:  Positive for nausea.  Endocrine: Negative for cold intolerance.  Genitourinary:  Negative for decreased urine volume, dysuria and hematuria.  Musculoskeletal:  Positive for myalgias. Negative for joint swelling.  Skin:  Negative for rash.  Allergic/Immunologic:  Positive for environmental allergies.  Neurological:  Positive for dizziness. Negative for syncope, facial asymmetry and weakness.  Psychiatric/Behavioral:  Negative for confusion and hallucinations. The patient is nervous/anxious.   See other pertinent positives and negatives in HPI.  Current Outpatient Medications on File Prior to Visit  Medication Sig Dispense Refill   albuterol  (VENTOLIN  HFA) 108 (90 Base) MCG/ACT inhaler Inhale 2 puffs into the lungs every 6 (six) hours as needed for wheezing or shortness of breath. 1 g 2   ALPRAZolam  (XANAX ) 0.25 MG tablet Take 1 tablet (0.25 mg total) by mouth daily as needed for anxiety. 15 tablet 0   aspirin-acetaminophen -caffeine  (EXCEDRIN MIGRAINE) 250-250-65 MG tablet Take by mouth every 6 (six) hours as needed for headache.     cephALEXin  (KEFLEX ) 500 MG capsule Take 500 mg by mouth 3 (three) times daily.     chlorhexidine  (PERIDEX ) 0.12 % solution Use as directed 5 mLs in the mouth or throat 2 (two) times daily. 1-2     cyclobenzaprine  (FLEXERIL ) 5 MG tablet Take 1 tablet (5 mg total) by mouth 3 (three) times daily as needed for muscle spasms. 90 tablet 5   EPINEPHrine  (EPIPEN  2-PAK) 0.3 mg/0.3 mL IJ SOAJ injection Inject 0.3 mg into the muscle as needed for anaphylaxis. 1 each 0   fluticasone  (FLONASE ) 50 MCG/ACT nasal spray Place 1 spray into both nostrils daily as needed for allergies or rhinitis.     gabapentin  (NEURONTIN ) 100 MG capsule Take 1 capsule (100 mg total) by mouth at bedtime. 30 capsule 0   Galcanezumab -gnlm (EMGALITY ) 120 MG/ML SOAJ Inject 120 mg into the skin every 28 (twenty-eight) days. 1.12 mL 11   ibuprofen  (ADVIL ) 800 MG tablet Take 800 mg by mouth every 8 (eight) hours as needed.     montelukast  (SINGULAIR ) 10 MG tablet Take 1 tablet (10 mg total) by mouth at bedtime. 90 tablet 1   predniSONE  (DELTASONE ) 10 MG tablet Take 60mg  on day 1, then 50mg  on day 2, then 40mg  on day 3, then 30mg  on day 4, then 20mg  on day 5, then 10mg   on day 6. 21 tablet 0   Vitamin D , Ergocalciferol , (DRISDOL ) 1.25 MG (50000 UNIT) CAPS capsule Take 1 capsule (50,000 Units total) by mouth every 7 (seven) days. 12 capsule 0   No current facility-administered medications on file prior to visit.    Past Medical History:  Diagnosis Date   Asthma    Atypical squamous cell changes of undetermined significance (ASCUS) on cervical cytology with positive high risk human papilloma virus (HPV) 07/08/2013   Overview:   ASCUS with positive high risk HPV 07/08/13  Mild to moderate dysplasia on colpo 02/10/14   Plan for repeat colpo in 6 months (Due 08/13/14) schedulers flagged.  ASCUS with positive high risk HPV 07/08/13  Mild to moderate dysplasia on colpo 02/10/14   Plan for repeat colpo in 6 months (Due 08/13/14) schedulers flagged.   Bronchitis 01/31/2016   Chiari malformation type I (HCC)    Depression    GERD (gastroesophageal reflux disease)    H/O multiple concussions  Headache(784.0)    History of palpitations    Hx of fracture of nose    Mental disorder    Multiple fractures    Hx: of a leg and arm fracture as a child   Pneumonia    PONV (postoperative nausea and vomiting)    Post traumatic stress disorder (PTSD)    Pregnant 01/21/2016   PTSD (post-traumatic stress disorder)    Sinus infection 01/31/2016   Supervision of normal pregnancy, antepartum 02/08/2016    [facility]             13+1 week FOB   [name] 38 yo WM Dating By  LMP and US  Pap  02/08/16 GC/CT Initial:                36+wks: Genetic Screen NT/IT:  CF screen  Anatomic US   Flu vaccine  Tdap Recommended ~ 28wks Glucose Screen  2 hr GBS  Feed Preference  Contraception  Circumcision  Childbirth Classes  Pediatrician     Allergies  Allergen Reactions   Codeine  Shortness Of Breath and Other (See Comments)    Can not take in liquid form but does not recall exactly what formulation she had Has taken vicodin and percocet without problem per pt    Haemophilus Influenzae Vaccines Anaphylaxis   Influenza A (H1n1) Monovalent Vaccine Anaphylaxis   Sulfa Antibiotics Anaphylaxis and Itching   Egg-Derived Products    Peanut-Containing Drug Products    Robaxin  [Methocarbamol ] Other (See Comments)    Migraine    Social History   Socioeconomic History   Marital status: Significant Other    Spouse name: Not on file   Number of children: 4   Years of education: Not on file   Highest education level: GED or equivalent  Occupational History   Occupation: disabled    Comment: PTSD  Tobacco Use   Smoking status: Some Days    Current packs/day: 0.50    Types: Cigarettes   Smokeless tobacco: Never   Tobacco comments:    NEVER USE SNUFF OR CHEWING TOBACCO  Vaping Use   Vaping status: Never Used  Substance and Sexual Activity   Alcohol use: Not Currently    Comment: social   Drug use: No   Sexual activity: Yes    Birth control/protection: None  Other Topics Concern   Not on file  Social History Narrative   Patient is right-handed. She is separated, has 4 sons. Is on disabilty for PTSD. She has one caffeine  drink daily, walks 3 x week.home is one level   Social Drivers of Corporate investment banker Strain: Low Risk  (11/14/2023)   Overall Financial Resource Strain (CARDIA)    Difficulty of Paying Living Expenses: Not hard at all  Food Insecurity: Food Insecurity Present (02/19/2024)   Hunger Vital Sign    Worried About Running Out of Food in the Last Year: Sometimes true    Ran Out of Food in the Last Year: Never true  Transportation Needs: No Transportation Needs (02/19/2024)   PRAPARE - Administrator, Civil Service (Medical): No    Lack of Transportation (Non-Medical): No  Physical Activity: Inactive (11/14/2023)   Exercise Vital Sign    Days of Exercise per Week: 0 days    Minutes of Exercise per Session: 0 min  Stress: Stress Concern Present (11/14/2023)   [employer] of Occupational Health - Occupational  Stress Questionnaire    Feeling of Stress : Rather  much  Social Connections: Moderately Integrated (11/14/2023)   Social Connection and Isolation Panel [NHANES]    Frequency of Communication with Friends and Family: More than three times a week    Frequency of Social Gatherings with Friends and Family: More than three times a week    Attends Religious Services: More than 4 times per year    Active Member of Clubs or Organizations: Yes    Attends Banker Meetings: More than 4 times per year    Marital Status: Divorced    Vitals:   04/25/24 1250  BP: 128/80  Pulse: 91  Resp: 12  SpO2: 98%   Body mass index is 28.12 kg/m.  Physical Exam Vitals and nursing note reviewed.  Constitutional:      General: She is not in acute distress.    Appearance: She is well-developed.  HENT:     Head: Normocephalic and atraumatic.     Right Ear: Tympanic membrane, ear canal and external ear normal.     Left Ear: Tympanic membrane, ear canal and external ear normal.     Mouth/Throat:     Mouth: Mucous membranes are moist.     Pharynx: Oropharynx is clear. Uvula midline.  Eyes:     Conjunctiva/sclera: Conjunctivae normal.  Cardiovascular:     Rate and Rhythm: Normal rate and regular rhythm.     Pulses:          Dorsalis pedis pulses are 2+ on the right side and 2+ on the left side.     Heart sounds: No murmur heard. Pulmonary:     Effort: Pulmonary effort is normal. No respiratory distress.     Breath sounds: Normal breath sounds.  Abdominal:     Palpations: Abdomen is soft. There is no mass.     Tenderness: There is no abdominal tenderness.  Lymphadenopathy:     Cervical: No cervical adenopathy.  Skin:    General: Skin is warm.     Findings: No erythema or rash.  Neurological:     General: No focal deficit present.     Mental Status: She is alert and oriented to person, place, and time.     Cranial Nerves: No cranial nerve deficit.     Motor: No pronator drift.      Gait: Gait normal.     Deep Tendon Reflexes:     Reflex Scores:      Bicep reflexes are 2+ on the right side and 2+ on the left side.      Patellar reflexes are 2+ on the right side and 2+ on the left side.    Comments: Declined Apley maneuver and Dix-Hallpike maneuver because head movement and lying down aggravate dizziness.   Psychiatric:        Mood and Affect: Mood is anxious. Affect is labile.   ASSESSMENT AND PLAN:  Ms. Dow was seen today for dizziness.   Benign paroxysmal positional vertigo, unspecified laterality It seems like dizziness has been a chronic problem, she is concerned because it seems to be worse for the past 3 weeks. We discussed differential Dx's, hx of more suggestive of positional vertigo and vestibular migraine is also in the differential. She has been evaluated by neurologist and ENT for this problem. I do not think imaging is needed today. Brain MRI was ordered in 11/2021, reports not available. Had brain MRI in 05/2019, which showed resolved foramen magnum stenosis after Chiari 1 decompression. Old white matter insults that are nonspecific and stable  since 03/03/2018. Recommend trying Meclizine  again, side effects discussed. States that Prednisone  has helped in the past as well as Alprazolam . Rx for Predniosne was sent by Dr Festus Hubert, her neurologist but does not want to take it for now.  PT referral for vestibular exercises placed. Fall precautions. Instructed about warning signs.  -     Ambulatory referral to Physical Therapy -     Meclizine  HCl; Take 1 tablet (25 mg total) by mouth 3 (three) times daily as needed for dizziness.  Dispense: 30 tablet; Refill: 0 -     C-reactive protein; Future  Nausea without vomiting Chronic. Could be aggravated by above problem as well as migraines. Refill for Ondansetron  sent to her pharmacy. Continue adequate hydration. Instructed about warning signs.  -     Ondansetron ; Take 1 tablet (4 mg total) by mouth every 8  (eight) hours as needed for up to 5 days for nausea or vomiting.  Dispense: 15 tablet; Refill: 0  Iron deficiency anemia, unspecified iron deficiency anemia type Minimal. She is requesting more labs done, I do not think cbc needs to be repated. Iron studies ordered. Fe Sulfate 325 mg can be started once every other day with vit C.  -     Iron; Future -     Ferritin; Future  GAD (generalized anxiety disorder) This problem could be contributing and/or aggravating some of her multiple complaints today. She is not longer on Alprazolam , states that did not tolerate Hydroxyzine , and not longer following with psychiatrist. Gabapentin  100 mg was also recommended in 03/2024 by psychiatrist, not taking it.  Migraine with aura and without status migrainosus, not intractable   Follows with neurologist. Recommend addressing concerns about Emgality , side effects briefly reviewed, dizziness is not common.  Palpitations  Chronic. Could be aggravated by anxiety. Per records, heart monitor in 2019 showed rare PAC's, mostly SR. Recommend following with PCP. Propranolol  was recommended in the past, she declined. I spent a total of 43 minutes in both face to face and non face to face activities for this visit on the date of this encounter. During this time history was obtained and documented, examination was performed, prior labs/imaging reviewed, and assessment/plan discussed.  I spent a total of 45 minutes in both face to face and non face to face activities for this visit on the date of this encounter. During this time history was obtained and documented, examination was performed, prior labs/imaging reviewed, and assessment/plan discussed.  Return in about 4 weeks (around 05/23/2024) for with PCP.  I, Fritz Jewel Wierda, acting as a scribe for Harveer Sadler [place], MD., have documented all relevant documentation on the behalf of Satya Bohall [place], MD, as directed by  Keiji Melland [place], MD while in the presence of Ahijah Devery  [place], MD.   I, Greysen Swanton [place], MD, have reviewed all documentation for this visit. The documentation on 04/26/24 for the exam, diagnosis, procedures, and orders are all accurate and complete.  Razan Siler G. [place], MD  [facility]. [place] office.

## 2024-04-25 NOTE — Telephone Encounter (Signed)
  Chief Complaint: dizziness, feels like fainting, seen in ED but still having dizziness this am Symptoms: dizziness, reports blurred vision and double vision and states it is bc of "other" dx. Reports chest pain with out difficulty breathing and reports she has had chest pain on and off for a while . Reports her hemoglobin in low and may need transfusion. Last labs in ED hemoglobin results 11.5 Frequency: 3 weeks  Pertinent Negatives: Patient denies chest pain with difficulty breathing no fever reported. No weakness on either side of body no N/T  Disposition: [] ED /[] Urgent Care (no appt availability in office) / [x] Appointment(In office/virtual)/ []  [facility]/ [] Home Care/ [] Refused Recommended Disposition /[] [place] [facility]/ []  Follow-up with PCP Additional Notes:   No available appt with PCP. Scheduled appt with other provider for today .  Recommended if sx worsen or return go to ED or call 911.    Copied from CRM [id number]. Topic: Clinical - Red Word Triage >> Apr 25, 2024  8:02 AM Earnestine Goes B wrote: Red Word that prompted transfer to Nurse Triage:dizzy, faint. Low hemoglobin Reason for Disposition  [1] Dizziness caused by heat exposure, sudden standing, or poor fluid intake AND [2] no improvement after 2 hours of rest and fluids  Answer Assessment - Initial Assessment Questions 1. DESCRIPTION: "Describe your dizziness."     Lightheaded feels like fainting 2. LIGHTHEADED: "Do you feel lightheaded?" (e.g., somewhat faint, woozy, weak upon standing)     Somewhat fainting  3. VERTIGO: "Do you feel like either you or the room is spinning or tilting?" (i.e. vertigo)     Room spinning yesterday in Dr office  4. SEVERITY: "How bad is it?"  "Do you feel like you are going to faint?" "Can you stand and walk?"   - MILD: Feels slightly dizzy, but walking normally.   - MODERATE: Feels unsteady when walking, but not falling; interferes with normal activities (e.g., school, work).    - SEVERE: Unable to walk without falling, or requires assistance to walk without falling; feels like passing out now.      Moderate now , feels like she needs to hold things  5. ONSET:  "When did the dizziness begin?"     3 weeks  6. AGGRAVATING FACTORS: "Does anything make it worse?" (e.g., standing, change in head position)     Standing  7. HEART RATE: "Can you tell me your heart rate?" "How many beats in 15 seconds?"  (Note: not all patients can do this)       na 8. CAUSE: "What do you think is causing the dizziness?"     Low hemoglobin 9. RECURRENT SYMPTOM: "Have you had dizziness before?" If Yes, ask: "When was the last time?" "What happened that time?"     Yes seen in ED 10. OTHER SYMPTOMS: "Do you have any other symptoms?" (e.g., fever, chest pain, vomiting, diarrhea, bleeding)       Chest pain at times. Feels like passing out at times esp when standing .  11. PREGNANCY: "Is there any chance you are pregnant?" "When was your last menstrual period?"       Na  Protocols used: Dizziness - Lightheadedness-A-AH

## 2024-04-26 ENCOUNTER — Encounter: Payer: Self-pay | Admitting: Family Medicine

## 2024-04-28 ENCOUNTER — Ambulatory Visit: Admitting: Psychology

## 2024-04-28 DIAGNOSIS — F411 Generalized anxiety disorder: Secondary | ICD-10-CM | POA: Diagnosis not present

## 2024-04-28 DIAGNOSIS — F330 Major depressive disorder, recurrent, mild: Secondary | ICD-10-CM

## 2024-04-28 NOTE — Progress Notes (Signed)
 [facility] Counselor/Therapist Progress Note  Patient ID: Mary [name], MRN: [id number]    Date: 04/28/24  Time Spent: 12:00 pm - 12:30 pm :  30 minutes  Treatment Type: Individual Therapy.  Reported Symptoms: Feeling nervous and on edge, not being able to stop worrying, trouble relaxing, being restless, feeling afraid as if something awful might happen, trouble with sleep, feeling like let self down, and trouble concentrating.    Mental Status Exam: Appearance:  Casual     Behavior: Drowsy  Motor: Normal  Speech/Language:  Normal Rate  Affect: Appropriate  Mood: normal  Thought process: normal  Thought content:   WNL  Sensory/Perceptual disturbances:   WNL  Orientation: oriented to person, place, and time/date  Attention: Good  Concentration: Good  Memory: WNL  Fund of knowledge:  Good  Insight:   Good  Judgment:  Good  Impulse Control: Good   Risk Assessment: Danger to Self:  No Self-injurious Behavior: No Danger to Others: No Duty to Warn:no Physical Aggression / Violence:No  Access to Firearms a concern: No  Gang Involvement:No   Subjective:   Mary [name] participated from home, via video, and consented to treatment. I discussed the limitations of evaluation and management by telemedicine and the availability of in person appointments. The patient expressed understanding and agreed to proceed.  Therapist participated from home office.  Mary [name] reviewed the events of the past week.   Patient requested this emergency meeting because she was feeling as though none of her medical providers, except [name], were listening to her. Patient saw a Dr. [name] on Friday, and that was a positive meeting for the patient. Patient shared how anxious she was feeling, and she agreed to practice relaxing in an effort to fill herself back up. Patient agreed not to meet on Memorial Day holiday, next Monday, and patient agreed to change her appt to 10 am every  other week. It had been 9 am. Patient was not able to meet for our entire session due to child care responsibilities.   Interventions: Cognitive Behavioral Therapy  Diagnosis:  Major depressive disorder, recurrent episode, mild (HCC) [F33.0]  Generalized Anxiety Disorder (41.1  Psychiatric Treatment: Yes , via psychiatrist and PCP  Treatment Plan:  Client Abilities/Strengths Mary [name] reports hopefulness, Self Advocacy, Ability to Communicate Effectively, and intelligence as her strengths   Support System: significant other, Mary [name], and ex husband: Mary [name]   Client Treatment Preferences OPT  Client Statement of Needs Mary [name] would like to learn techniques to decrease her anxiety, got back to work even if it's part-time, build her trust in [place], cut back on smoking cigarettes, decrease her worrying, and learn how to focus more effectively.      Treatment Level Biweekly  Symptoms  Anxiety: feeling nervous and on edge, not being able to stop worrying, trouble relaxing, being restless, feeling afraid as if something awful might happen, Depression: trouble with sleep, feeling like let self down, and trouble concentrating.  Goals:   Mary [name] experiences symptoms of depression and anxiety   Target Date: 02/13/25 Frequency: Biweekly  Progress: 0 Modality: individual    Therapist will provide referrals for additional resources as appropriate.  Therapist will provide psycho-education regarding Mary [name] diagnosis and corresponding treatment approaches and interventions. Mary [name] will support the patient's ability to achieve the goals identified. will employ CBT, BA, Problem-solving, Solution Focused, Mindfulness,  coping skills, & other evidenced-based practices will be used to promote progress towards healthy functioning to help manage decrease symptoms associated  with their diagnosis.   Reduce overall level, frequency, and intensity of the feelings of depression, anxiety and panic  evidenced by decreased overall symptoms from 6 to 7 days/week to 0 to 1 days/week per client report for at least 3 consecutive months. Verbally express understanding of the relationship between feelings of depression and anxiety and their impact on thinking patterns and behaviors. Verbalize an understanding of the role that distorted thinking plays in creating fears, excessive worry, and ruminations.  Mary [name] participated in the creation of the treatment plan)   Mary [name]

## 2024-04-29 LAB — FERRITIN: Ferritin: 5.4 ng/mL — ABNORMAL LOW (ref 10.0–291.0)

## 2024-04-30 ENCOUNTER — Ambulatory Visit: Payer: Self-pay | Admitting: Family Medicine

## 2024-04-30 ENCOUNTER — Telehealth (INDEPENDENT_AMBULATORY_CARE_PROVIDER_SITE_OTHER): Admitting: Family Medicine

## 2024-04-30 ENCOUNTER — Encounter: Payer: Self-pay | Admitting: Family Medicine

## 2024-04-30 ENCOUNTER — Ambulatory Visit: Payer: Self-pay

## 2024-04-30 ENCOUNTER — Ambulatory Visit: Admitting: Psychology

## 2024-04-30 VITALS — Ht 65.0 in

## 2024-04-30 DIAGNOSIS — F411 Generalized anxiety disorder: Secondary | ICD-10-CM

## 2024-04-30 DIAGNOSIS — H8110 Benign paroxysmal vertigo, unspecified ear: Secondary | ICD-10-CM

## 2024-04-30 MED ORDER — ESCITALOPRAM OXALATE 10 MG PO TABS: 10.0000 mg | ORAL_TABLET | Freq: Every day | ORAL | 2 refills | Status: DC

## 2024-04-30 NOTE — Progress Notes (Signed)
 Virtual Visit via Video Note I connected with Mary [name] on 04/30/2024 by a video enabled telemedicine application and verified that I am speaking with the correct person using two identifiers. Location patient: home Location provider:work office Persons participating in the virtual visit: patient, provider, scribe  I discussed the limitations of evaluation and management by telemedicine and the availability of in person appointments. The patient expressed understanding and agreed to proceed.  Chief Complaint  Patient presents with   Anxiety   HPI: Ms. Mary [name] is a 38 y.o. with a PMHx significant for migraine, asthma, Chiari I malformation s/p decompression, vitamin D  deficiency, HLD, anxiety, and depression, who is being seen on video today for anxiety.   Patient complains of increased anxiety lately. She has not seen her kids for almost a week because she cannot drive due to vertigo, so they are staying with her ex-husband.  She says she is having panic attacks and having difficulty completing her daily tasks and taking care of her smaller child.   She had tried taking Cymbalta  and Hydroxyzine  before , she did not tolerated well. She was seen psychiatry in the past, she is looking for another provider.  She has tried Lexapro in the past, which she thought was helpful. Also tried Sertraline  in the past but says it did not help.  After talking with her insurance, she got a list of psychiatrists in her network, but says most are booked for several months (earliest is 09/2024).  She is seeing counselor regularly, last visit 04/28/2024.  Anxiety has been aggravated by dizziness, for which I saw her on 04/25/2024.  No new associated symptoms. She has an appointment tomorrow for vestibular exercises for her vertigo.  She mentions that alprazolam , which she was prescribed in 03/2024, helped with episodes of dizziness/vertigo.  States that neurologist and ENT recommended trial of clonazepam  but they defer final decision to her psychiatrist or PCP.  ROS: See pertinent positives and negatives per HPI.  Past Medical History:  Diagnosis Date   Asthma    Atypical squamous cell changes of undetermined significance (ASCUS) on cervical cytology with positive high risk human papilloma virus (HPV) 07/08/2013   Overview:   ASCUS with positive high risk HPV 07/08/13  Mild to moderate dysplasia on colpo 02/10/14   Plan for repeat colpo in 6 months (Due 08/13/14) schedulers flagged.  ASCUS with positive high risk HPV 07/08/13  Mild to moderate dysplasia on colpo 02/10/14   Plan for repeat colpo in 6 months (Due 08/13/14) schedulers flagged.   Bronchitis 01/31/2016   Chiari malformation type I (HCC)    Depression    GERD (gastroesophageal reflux disease)    H/O multiple concussions    Headache(784.0)    History of palpitations    Hx of fracture of nose    Mental disorder    Multiple fractures    Hx: of a leg and arm fracture as a child   Pneumonia    PONV (postoperative nausea and vomiting)    Post traumatic stress disorder (PTSD)    Pregnant 01/21/2016   PTSD (post-traumatic stress disorder)    Sinus infection 01/31/2016   Supervision of normal pregnancy, antepartum 02/08/2016    [facility]             13+1 week FOB   [name] 38 yo WM Dating By  LMP and US  Pap  02/08/16 GC/CT Initial:  36+wks: Genetic Screen NT/IT:  CF screen  Anatomic US   Flu vaccine  Tdap Recommended ~ 28wks Glucose Screen  2 hr GBS  Feed Preference  Contraception  Circumcision  Childbirth Classes  Pediatrician     Past Surgical History:  Procedure Laterality Date   botox injections     colposcopy     DILATION AND CURETTAGE OF UTERUS     nexplanon     SUBOCCIPITAL CRANIECTOMY CERVICAL LAMINECTOMY N/A 12/16/2018   Procedure: Cervical one Laminectomy/Chiari decompression;  Surgeon: Gearl Keens, MD;  Location: [facility] OR;  Service: Neurosurgery;  Laterality: N/A;   WISDOM TOOTH  EXTRACTION     Family History  Problem Relation Age of Onset   Breast cancer Mother        35s   Cancer Mother 85       breast   Hypertension Mother    Depression Mother    Depression Father    Seizures Father    Cancer Maternal Grandmother        breast and cervical   Drug abuse Paternal Grandmother    Alcohol abuse Paternal Grandmother    Breast cancer Cousin 15       paternal first cousin   Asthma Son    Asthma Son    Hypertension Other    Cancer Other    Social History   Socioeconomic History   Marital status: Significant Other    Spouse name: Not on file   Number of children: 4   Years of education: Not on file   Highest education level: GED or equivalent  Occupational History   Occupation: disabled    Comment: PTSD  Tobacco Use   Smoking status: Some Days    Current packs/day: 0.50    Types: Cigarettes   Smokeless tobacco: Never   Tobacco comments:    NEVER USE SNUFF OR CHEWING TOBACCO  Vaping Use   Vaping status: Never Used  Substance and Sexual Activity   Alcohol use: Not Currently    Comment: social   Drug use: No   Sexual activity: Yes    Birth control/protection: None  Other Topics Concern   Not on file  Social History Narrative   Patient is right-handed. She is separated, has 4 sons. Is on disabilty for PTSD. She has one caffeine  drink daily, walks 3 x week.home is one level   Social Drivers of Corporate investment banker Strain: Low Risk  (11/14/2023)   Overall Financial Resource Strain (CARDIA)    Difficulty of Paying Living Expenses: Not hard at all  Food Insecurity: Food Insecurity Present (02/19/2024)   Hunger Vital Sign    Worried About Running Out of Food in the Last Year: Sometimes true    Ran Out of Food in the Last Year: Never true  Transportation Needs: No Transportation Needs (02/19/2024)   PRAPARE - Administrator, Civil Service (Medical): No    Lack of Transportation (Non-Medical): No  Physical Activity: Inactive  (11/14/2023)   Exercise Vital Sign    Days of Exercise per Week: 0 days    Minutes of Exercise per Session: 0 min  Stress: Stress Concern Present (11/14/2023)   [employer] of Occupational Health - Occupational Stress Questionnaire    Feeling of Stress : Rather much  Social Connections: Moderately Integrated (11/14/2023)   Social Connection and Isolation Panel [NHANES]    Frequency of Communication with Friends and Family: More than three times a week    Frequency of  Social Gatherings with Friends and Family: More than three times a week    Attends Religious Services: More than 4 times per year    Active Member of [employer] or Organizations: Yes    Attends Engineer, structural: More than 4 times per year    Marital Status: Divorced  Intimate Partner Violence: Not At Risk (11/14/2023)   Humiliation, Afraid, Rape, and Kick questionnaire    Fear of Current or Ex-Partner: No    Emotionally Abused: No    Physically Abused: No    Sexually Abused: No    Current Outpatient Medications:    albuterol  (VENTOLIN  HFA) 108 (90 Base) MCG/ACT inhaler, Inhale 2 puffs into the lungs every 6 (six) hours as needed for wheezing or shortness of breath., Disp: 1 g, Rfl: 2   ALPRAZolam  (XANAX ) 0.25 MG tablet, Take 1 tablet (0.25 mg total) by mouth daily as needed for anxiety., Disp: 15 tablet, Rfl: 0   aspirin-acetaminophen -caffeine  (EXCEDRIN MIGRAINE) 250-250-65 MG tablet, Take by mouth every 6 (six) hours as needed for headache., Disp: , Rfl:    cyclobenzaprine  (FLEXERIL ) 5 MG tablet, Take 1 tablet (5 mg total) by mouth 3 (three) times daily as needed for muscle spasms., Disp: 90 tablet, Rfl: 5   EPINEPHrine  (EPIPEN  2-PAK) 0.3 mg/0.3 mL IJ SOAJ injection, Inject 0.3 mg into the muscle as needed for anaphylaxis., Disp: 1 each, Rfl: 0   escitalopram (LEXAPRO) 10 MG tablet, Take 1 tablet (10 mg total) by mouth daily., Disp: 30 tablet, Rfl: 2   fluticasone  (FLONASE ) 50 MCG/ACT nasal spray, Place 1 spray  into both nostrils daily as needed for allergies or rhinitis., Disp: , Rfl:    gabapentin  (NEURONTIN ) 100 MG capsule, Take 1 capsule (100 mg total) by mouth at bedtime., Disp: 30 capsule, Rfl: 0   Galcanezumab -gnlm (EMGALITY ) 120 MG/ML SOAJ, Inject 120 mg into the skin every 28 (twenty-eight) days., Disp: 1.12 mL, Rfl: 11   ibuprofen  (ADVIL ) 800 MG tablet, Take 800 mg by mouth every 8 (eight) hours as needed., Disp: , Rfl:    meclizine  (ANTIVERT ) 25 MG tablet, Take 1 tablet (25 mg total) by mouth 3 (three) times daily as needed for dizziness., Disp: 30 tablet, Rfl: 0   montelukast  (SINGULAIR ) 10 MG tablet, Take 1 tablet (10 mg total) by mouth at bedtime., Disp: 90 tablet, Rfl: 1   ondansetron  (ZOFRAN -ODT) 4 MG disintegrating tablet, Take 1 tablet (4 mg total) by mouth every 8 (eight) hours as needed for up to 5 days for nausea or vomiting., Disp: 15 tablet, Rfl: 0   predniSONE  (DELTASONE ) 10 MG tablet, Take 60mg  on day 1, then 50mg  on day 2, then 40mg  on day 3, then 30mg  on day 4, then 20mg  on day 5, then 10mg  on day 6., Disp: 21 tablet, Rfl: 0   Vitamin D , Ergocalciferol , (DRISDOL ) 1.25 MG (50000 UNIT) CAPS capsule, Take 1 capsule (50,000 Units total) by mouth every 7 (seven) days., Disp: 12 capsule, Rfl: 0  EXAM:  VITALS per patient if applicable:Ht 5\' 5"  (1.651 m)   BMI 28.12 kg/m   GENERAL: alert, oriented, appears well and in no acute distress  HEENT: atraumatic, conjunctiva clear, no obvious abnormalities on inspection of external nose and ears  NECK: normal movements of the head and neck  LUNGS: on inspection no signs of respiratory distress, breathing rate appears normal, no obvious gross SOB, gasping or wheezing  CV: no obvious cyanosis  MS: moves all visible extremities without noticeable abnormality  PSYCH/NEURO: pleasant  and cooperative, no obvious depression or anxiety, speech and thought processing grossly intact  ASSESSMENT AND PLAN:  Discussed the following assessment  and plan:  Benign paroxysmal positional vertigo, unspecified laterality We reviewed diagnosis, prognosis, and treatment options. She has not restarted meclizine  yet but took prednisone  for a few days as recommended by her neurologist. She has seen ENT in the past and follows with neurology regularly. Next appointment with neurology is on 05/14/2024. Fall precautions to continue. She has an appointment tomorrow to start vestibular exercises. She can discuss the option of benzodiazepines as part of treatment with PCP or neurologist.  GAD (generalized anxiety disorder) - Plan: escitalopram (LEXAPRO) 10 MG tablet Problem is getting worse, aggravated by health concerns and the death of her mother a few months ago. We discussed treatment options, in the past she felt like Lexapro helped, so she agrees with resuming Lexapro 10 mg daily. We reviewed son side effects. Continue CBT. Keep appointment with PCP 05/13/2024.  We discussed possible serious and likely etiologies, options for evaluation and workup, limitations of telemedicine visit vs in person visit, treatment, treatment risks and precautions. The patient was advised to call back or seek an in-person evaluation if the symptoms worsen or if the condition fails to improve as anticipated. I discussed the assessment and treatment plan with the patient. The patient was provided an opportunity to ask questions and all were answered. The patient agreed with the plan and demonstrated an understanding of the instructions.  Return for keep next appointment with PCP 05/13/24.  I, Fritz Jewel Wierda, acting as a scribe for Keston Seever [place], MD., have documented all relevant documentation on the behalf of Nadya Hopwood [place], MD, as directed by  Amberrose Friebel [place], MD while in the presence of Riven Mabile [place], MD.   I, Ama Mcmaster [place], MD, have reviewed all documentation for this visit. The documentation on 04/30/24 for the exam, diagnosis, procedures, and orders are all accurate and  complete.  Radha Coggins [place], MD

## 2024-04-30 NOTE — Telephone Encounter (Signed)
 Chief Complaint: anxiety Symptoms: severe anxiety (improved after speaking with triage RN and improves a little after taking Xanax ), dizziness Frequency: worsening over the past 3 weeks Pertinent Negatives: Patient denies thoughts of self harm or harming others Disposition: [] ED /[] Urgent Care (no appt availability in office) / [x] Appointment(In office/virtual)/ []  [facility]/ [] Home Care/ [] Refused Recommended Disposition /[] [facility]/ []  Follow-up with PCP Additional Notes: Patient states in the past she was on Xanax , Cymbalta  and hydroxyzine . She states she has some Xanax  left but would like to be started on something until she can be seen by a new psychiatrist. She states the soonest she can see a psychiatrist isn't for a few months. Patient states she doesn't have any transportation, agreeable to virtual visit with provider. Patient verbalizes understanding to call back for new or worsening symptoms.   Copied from CRM [id number]. Topic: Clinical - Medication Question >> Apr 30, 2024  9:16 AM Mary [name] wrote: Reason for CRM: Patient would like to know if she can be prescribed some medication prior to her appointment with a psychologist. Next available appointment with any provider will be 4 weeks out. Medication previously prescribed sedated patient which was Suboxone and Cymbalta . Patient is okay with any medication aside from the medication previously prescribed.   Contact Number: [phone] Pharmacy confirmed:[facility] [address], [place] - [place] [address] Reason for Disposition  MODERATE anxiety (e.g., persistent or frequent anxiety symptoms; interferes with sleep, school, or work)  Answer Assessment - Initial Assessment Questions 1. CONCERN: "Did anything happen that prompted you to call today?"      She states her mother was sick with cancer and passed away in Jun 11, 2024. She states her children are with her ex husband because she can not drive the anxiety  is physically affecting her.   2. ANXIETY SYMPTOMS: "Can you describe how you (your loved one; patient) have been feeling?" (e.g., tense, restless, panicky, anxious, keyed up, overwhelmed, sense of impending doom).      Patient states she has had dizziness from her anxiety.   3. ONSET: "How long have you been feeling this way?" (e.g., hours, days, weeks)     She states since they lowered her dose of Xanax , the past 3 weeks.  4. SEVERITY: "How would you rate the level of anxiety?" (e.g., 0 - 10; or mild, moderate, severe).     Severe.  5. FUNCTIONAL IMPAIRMENT: "How have these feelings affected your ability to do daily activities?" "Have you had more difficulty than usual doing your normal daily activities?" (e.g., getting better, same, worse; self-care, school, work, interactions)     She states she is not able to drive, dizziness. She states she can not watch TV due to she can't focus. She states she can't cook or clean around the house. She states she has decreased appetite.  6. HISTORY: "Have you felt this way before?" "Have you ever been diagnosed with an anxiety problem in the past?" (e.g., generalized anxiety disorder, panic attacks, PTSD). If Yes, ask: "How was this problem treated?" (e.g., medicines, counseling, etc.)     Generalized anxiety disorder, PTSD, major depressive disorder. Treated in the past with medication.  7. RISK OF HARM - SUICIDAL IDEATION: "Do you ever have thoughts of hurting or killing yourself?" If Yes, ask:  "Do you have these feelings now?" "Do you have a plan on how you would do this?"     Denies.  8. TREATMENT:  "What has been done so far  to treat this anxiety?" (e.g., medicines, relaxation strategies). "What has helped?"     Medications, lavender oil, drinking green tea, and breathing exercises.  9. TREATMENT - THERAPIST: "Do you have a counselor or therapist? Name?"     She states she was dismissed from her counselor.  10. POTENTIAL TRIGGERS: "Do you drink  caffeinated beverages (e.g., coffee, colas, teas), and how much daily?" "Do you drink alcohol or use any drugs?" "Have you started any new medicines recently?"       Denies any caffeinated beverages, alcohol or drugs.  11. PATIENT SUPPORT: "Who is with you now?" "Who do you live with?" "Do you have family or friends who you can talk to?"        Mary [name], her boyfriend, and her sons.  12. OTHER SYMPTOMS: "Do you have any other symptoms?" (e.g., feeling depressed, trouble concentrating, trouble sleeping, trouble breathing, palpitations or fast heartbeat, chest pain, sweating, nausea, or diarrhea)       Feeling hot then cold, chest pain and dizziness.  13. PREGNANCY: "Is there any chance you are pregnant?" "When was your last menstrual period?"       LMP: on Depo shots, she states end of April was last period.  Protocols used: Anxiety and Panic Attack-A-AH

## 2024-05-01 ENCOUNTER — Encounter: Payer: Self-pay | Admitting: Physical Therapy

## 2024-05-01 ENCOUNTER — Ambulatory Visit: Attending: Family Medicine | Admitting: Physical Therapy

## 2024-05-01 DIAGNOSIS — R42 Dizziness and giddiness: Secondary | ICD-10-CM | POA: Diagnosis not present

## 2024-05-01 DIAGNOSIS — H8110 Benign paroxysmal vertigo, unspecified ear: Secondary | ICD-10-CM | POA: Diagnosis not present

## 2024-05-01 DIAGNOSIS — H8112 Benign paroxysmal vertigo, left ear: Secondary | ICD-10-CM | POA: Insufficient documentation
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
## 2024-05-02 ENCOUNTER — Encounter: Payer: Self-pay | Admitting: Family Medicine

## 2024-05-02 DIAGNOSIS — F411 Generalized anxiety disorder: Secondary | ICD-10-CM

## 2024-05-05 ENCOUNTER — Encounter: Payer: Self-pay | Admitting: Family Medicine

## 2024-05-06 ENCOUNTER — Encounter: Payer: Self-pay | Admitting: Internal Medicine

## 2024-05-06 ENCOUNTER — Telehealth: Admitting: Family Medicine

## 2024-05-06 ENCOUNTER — Ambulatory Visit: Admitting: Psychology

## 2024-05-06 ENCOUNTER — Ambulatory Visit: Admitting: Physical Therapy

## 2024-05-06 ENCOUNTER — Telehealth (INDEPENDENT_AMBULATORY_CARE_PROVIDER_SITE_OTHER): Admitting: Internal Medicine

## 2024-05-06 DIAGNOSIS — F411 Generalized anxiety disorder: Secondary | ICD-10-CM

## 2024-05-06 DIAGNOSIS — F330 Major depressive disorder, recurrent, mild: Secondary | ICD-10-CM

## 2024-05-06 NOTE — Progress Notes (Signed)
 Per patient no change in vitals since last visit, unable to obtain new vitals due to telehealth visit.

## 2024-05-06 NOTE — Progress Notes (Signed)
 Virtual Visit via Video Note  I connected with Mary [name] on 05/06/24 at  4:00 PM EDT by a video enabled telemedicine application and verified that I am speaking with the correct person using two identifiers.  Location patient: home Location provider: work office Persons participating in the virtual visit: patient, provider  I discussed the limitations of evaluation and management by telemedicine and the availability of in person appointments. The patient expressed understanding and agreed to proceed.   HPI: She scheduled this visit to discuss anxiety and depression.  She was discharged from previous psychiatrist after stating she no longer wanted to see them.  They tried different options to try and get her off alprazolam , apparently nothing has worked.  Her anxiety continues to be an issue.  They gave her an alprazolam  taper and no more alprazolam  was supposed to be prescribed.  She was seen by another provider in my office and started on Lexapro 10 mg.  She has taken it for a few days and has not noticed any difference, has had some shaking.   ROS: Negative unless indicated in HPI.  Past Medical History:  Diagnosis Date   Asthma    Atypical squamous cell changes of undetermined significance (ASCUS) on cervical cytology with positive high risk human papilloma virus (HPV) 07/08/2013   Overview:   ASCUS with positive high risk HPV 07/08/13  Mild to moderate dysplasia on colpo 02/10/14   Plan for repeat colpo in 6 months (Due 08/13/14) schedulers flagged.  ASCUS with positive high risk HPV 07/08/13  Mild to moderate dysplasia on colpo 02/10/14   Plan for repeat colpo in 6 months (Due 08/13/14) schedulers flagged.   Bronchitis 01/31/2016   Chiari malformation type I (HCC)    Depression    GERD (gastroesophageal reflux disease)    H/O multiple concussions    Headache(784.0)    History of palpitations    Hx of fracture of nose    Mental disorder    Multiple fractures    Hx: of a leg and  arm fracture as a child   Pneumonia    PONV (postoperative nausea and vomiting)    Post traumatic stress disorder (PTSD)    Pregnant 01/21/2016   PTSD (post-traumatic stress disorder)    Sinus infection 01/31/2016   Supervision of normal pregnancy, antepartum 02/08/2016    [facility]             13+1 week FOB   [name] 38 yo WM Dating By  LMP and US  Pap  02/08/16 GC/CT Initial:                36+wks: Genetic Screen NT/IT:  CF screen  Anatomic US   Flu vaccine  Tdap Recommended ~ 28wks Glucose Screen  2 hr GBS  Feed Preference  Contraception  Circumcision  Childbirth Classes  Pediatrician      Past Surgical History:  Procedure Laterality Date   botox injections     colposcopy     DILATION AND CURETTAGE OF UTERUS     nexplanon     SUBOCCIPITAL CRANIECTOMY CERVICAL LAMINECTOMY N/A 12/16/2018   Procedure: Cervical one Laminectomy/Chiari decompression;  Surgeon: Gearl Keens, MD;  Location: [facility] OR;  Service: Neurosurgery;  Laterality: N/A;   WISDOM TOOTH EXTRACTION      Family History  Problem Relation Age of Onset   Breast cancer Mother        30s   Cancer Mother 31  breast   Hypertension Mother    Depression Mother    Depression Father    Seizures Father    Cancer Maternal Grandmother        breast and cervical   Drug abuse Paternal Grandmother    Alcohol abuse Paternal Grandmother    Breast cancer Cousin 42       paternal first cousin   Asthma Son    Asthma Son    Hypertension Other    Cancer Other     SOCIAL HX:   reports that she has been smoking cigarettes. She has never used smokeless tobacco. She reports that she does not currently use alcohol. She reports that she does not use drugs.   Current Outpatient Medications:    albuterol  (VENTOLIN  HFA) 108 (90 Base) MCG/ACT inhaler, Inhale 2 puffs into the lungs every 6 (six) hours as needed for wheezing or shortness of breath., Disp: 1 g, Rfl: 2   aspirin-acetaminophen -caffeine  (EXCEDRIN  MIGRAINE) 250-250-65 MG tablet, Take by mouth every 6 (six) hours as needed for headache., Disp: , Rfl:    cyclobenzaprine  (FLEXERIL ) 5 MG tablet, Take 1 tablet (5 mg total) by mouth 3 (three) times daily as needed for muscle spasms., Disp: 90 tablet, Rfl: 5   EPINEPHrine  (EPIPEN  2-PAK) 0.3 mg/0.3 mL IJ SOAJ injection, Inject 0.3 mg into the muscle as needed for anaphylaxis., Disp: 1 each, Rfl: 0   escitalopram (LEXAPRO) 10 MG tablet, Take 1 tablet (10 mg total) by mouth daily., Disp: 30 tablet, Rfl: 2   fluticasone  (FLONASE ) 50 MCG/ACT nasal spray, Place 1 spray into both nostrils daily as needed for allergies or rhinitis., Disp: , Rfl:    gabapentin  (NEURONTIN ) 100 MG capsule, Take 1 capsule (100 mg total) by mouth at bedtime., Disp: 30 capsule, Rfl: 0   Galcanezumab -gnlm (EMGALITY ) 120 MG/ML SOAJ, Inject 120 mg into the skin every 28 (twenty-eight) days., Disp: 1.12 mL, Rfl: 11   ibuprofen  (ADVIL ) 800 MG tablet, Take 800 mg by mouth every 8 (eight) hours as needed., Disp: , Rfl:    meclizine  (ANTIVERT ) 25 MG tablet, Take 1 tablet (25 mg total) by mouth 3 (three) times daily as needed for dizziness., Disp: 30 tablet, Rfl: 0   montelukast  (SINGULAIR ) 10 MG tablet, Take 1 tablet (10 mg total) by mouth at bedtime., Disp: 90 tablet, Rfl: 1   Vitamin D , Ergocalciferol , (DRISDOL ) 1.25 MG (50000 UNIT) CAPS capsule, Take 1 capsule (50,000 Units total) by mouth every 7 (seven) days., Disp: 12 capsule, Rfl: 0   ALPRAZolam  (XANAX ) 0.25 MG tablet, Take 1 tablet (0.25 mg total) by mouth daily as needed for anxiety. (Patient not taking: Reported on 05/06/2024), Disp: 15 tablet, Rfl: 0  EXAM:   VITALS per patient if applicable: None reported  GENERAL: alert, oriented, appears well and in no acute distress  HEENT: atraumatic, conjunttiva clear, no obvious abnormalities on inspection of external nose and ears  NECK: normal movements of the head and neck  LUNGS: on inspection no signs of respiratory distress,  breathing rate appears normal, no obvious gross increased work of breathing, gasping or wheezing  CV: no obvious cyanosis  MS: moves all visible extremities without noticeable abnormality  PSYCH/NEURO: pleasant and cooperative, appears significantly anxious on exam, speech and thought processing grossly intact  ASSESSMENT AND PLAN:   Major depressive disorder, recurrent episode, mild (HCC)  GAD (generalized anxiety disorder)  -Advise she cut Lexapro in half and take 5 mg daily to begin with.  Have also informed her  that if she is feeling significantly unwell can consider walk-in behavioral health services at [facility] health or at [facility].   I discussed the assessment and treatment plan with the patient. The patient was provided an opportunity to ask questions and all were answered. The patient agreed with the plan and demonstrated an understanding of the instructions.   The patient was advised to call back or seek an in-person evaluation if the symptoms worsen or if the condition fails to improve as anticipated.    Marguerita Shih, MD  [facility]

## 2024-05-07 ENCOUNTER — Ambulatory Visit: Admitting: Psychology

## 2024-05-07 ENCOUNTER — Encounter: Payer: Self-pay | Admitting: Internal Medicine

## 2024-05-08 ENCOUNTER — Ambulatory Visit: Admitting: Physical Therapy

## 2024-05-08 ENCOUNTER — Encounter: Payer: Self-pay | Admitting: Neurology

## 2024-05-08 VITALS — BP 122/88 | HR 109

## 2024-05-08 DIAGNOSIS — R42 Dizziness and giddiness: Secondary | ICD-10-CM | POA: Diagnosis not present

## 2024-05-08 DIAGNOSIS — H8112 Benign paroxysmal vertigo, left ear: Secondary | ICD-10-CM

## 2024-05-08 DIAGNOSIS — H8110 Benign paroxysmal vertigo, unspecified ear: Secondary | ICD-10-CM | POA: Diagnosis not present
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
## 2024-05-09 ENCOUNTER — Encounter: Payer: Self-pay | Admitting: Physical Therapy

## 2024-05-12 ENCOUNTER — Ambulatory Visit (INDEPENDENT_AMBULATORY_CARE_PROVIDER_SITE_OTHER): Admitting: Psychology

## 2024-05-12 DIAGNOSIS — F330 Major depressive disorder, recurrent, mild: Secondary | ICD-10-CM

## 2024-05-12 DIAGNOSIS — F411 Generalized anxiety disorder: Secondary | ICD-10-CM

## 2024-05-12 NOTE — Progress Notes (Unsigned)
 [facility] Counselor/Therapist Progress Note  Patient ID: Mary [name], MRN: [id number]    Date: 05/12/24  Time Spent: 10:00 am - 10: 45 am  : 45  minutes  Treatment Type: Individual Therapy.  Reported Symptoms: Feeling nervous and on edge, not being able to stop worrying, trouble relaxing, being restless, feeling afraid as if something awful might happen, trouble with sleep, feeling like let self down, and trouble concentrating.   Mental Status Exam: Appearance:  Well Groomed     Behavior: Appropriate  Motor: Normal  Speech/Language:  Normal Rate  Affect: Appropriate  Mood: normal  Thought process: normal  Thought content:   WNL  Sensory/Perceptual disturbances:   WNL  Orientation: oriented to person, place, and time/date  Attention: Good  Concentration: Good  Memory: WNL  Fund of knowledge:  Good  Insight:   Good  Judgment:  Good  Impulse Control: Good   Risk Assessment: Danger to Self:  No Self-injurious Behavior: No Danger to Others: No Duty to Warn:no Physical Aggression / Violence:No  Access to Firearms a concern: No  Gang Involvement:No   Subjective:   [name] participated from home, via video, and consented to treatment. I discussed the limitations of evaluation and management by telemedicine and the availability of in person appointments. The patient expressed understanding and agreed to proceed.  Therapist participated from home office.  Gracynn reviewed the events of the past week.   Patient shared a great deal of information about the "roller coaster" of emotions and crises she had experienced since our last session. Patient continued to talk about family dynamics, both within her biological family and in [place] biological family.    Interventions: Cognitive Behavioral Therapy  Diagnosis:  No diagnosis found.  Psychiatric Treatment: Yes , psychiatrist and PCP  Treatment Plan:  Client Abilities/Strengths [name] ***  Support  System: ***  Client Treatment Preferences ***  Client Statement of Needs [name] would like to ***   Treatment Level {Frequency of sessions.:26745}  Symptoms  ***   (Status: {Symptom Status:26744}) ***   (Status: {Symptom Status:26744})  Goals:   [name] experiences symptoms of ***   Target Date: *** Frequency: {Frequency of sessions.:26745}  Progress: 0 Modality: individual    Therapist will provide referrals for additional resources as appropriate.  Therapist will provide psycho-education regarding [name]'s diagnosis and corresponding treatment approaches and interventions. [name] will support the patient's ability to achieve the goals identified. will employ CBT, BA, Problem-solving, Solution Focused, Mindfulness,  coping skills, & other evidenced-based practices will be used to promote progress towards healthy functioning to help manage decrease symptoms associated with {his/her/their:21314} diagnosis.   Reduce overall level, frequency, and intensity of the feelings of depression, anxiety and panic evidenced by decreased *** from 6 to 7 days/week to 0 to 1 days/week per client report for at least 3 consecutive months. Verbally express understanding of the relationship between feelings of ***depression, ***anxiety and their impact on thinking patterns and behaviors. Verbalize an understanding of the role that distorted thinking plays in creating fears, excessive worry, and ruminations.  [name] participated in the creation of the treatment plan)   [name]

## 2024-05-13 ENCOUNTER — Ambulatory Visit: Admitting: Internal Medicine
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
## 2024-05-14 ENCOUNTER — Telehealth: Payer: 59 | Admitting: Neurology

## 2024-05-14 DIAGNOSIS — G43809 Other migraine, not intractable, without status migrainosus: Secondary | ICD-10-CM

## 2024-05-14 DIAGNOSIS — Z79899 Other long term (current) drug therapy: Secondary | ICD-10-CM | POA: Diagnosis not present

## 2024-05-14 MED ORDER — LAMOTRIGINE 25 MG PO TABS: ORAL_TABLET | ORAL | 0 refills | Status: DC

## 2024-05-14 MED ORDER — ONDANSETRON 4 MG PO TBDP: 4.0000 mg | ORAL_TABLET | Freq: Three times a day (TID) | ORAL | 5 refills | Status: DC | PRN

## 2024-05-14 NOTE — Patient Instructions (Signed)
 Start lamotrigine 25mg  tablet.  Take 1 tablet at bedtime for 2 weeks, then 1 tablet twice daily for two weeks, then increase to 2 tablets twice daily.  Contact me for refills.  If you experience a new rash, contact me When you get a worsening dizzy flare, take Nurtec (1 tablet daily as needed) - come by office to pick up samples.  Take with ondansetron  for nausea Follow up 3 months.

## 2024-05-15 ENCOUNTER — Encounter: Payer: Self-pay | Admitting: Neurology

## 2024-05-16 ENCOUNTER — Other Ambulatory Visit: Payer: Self-pay | Admitting: Neurology

## 2024-05-16 MED ORDER — ACETAZOLAMIDE 250 MG PO TABS: 250.0000 mg | ORAL_TABLET | Freq: Two times a day (BID) | ORAL | 5 refills | Status: AC

## 2024-05-19 ENCOUNTER — Ambulatory Visit: Admitting: Psychology

## 2024-05-20 ENCOUNTER — Ambulatory Visit: Admitting: Psychology

## 2024-05-20 ENCOUNTER — Ambulatory Visit (INDEPENDENT_AMBULATORY_CARE_PROVIDER_SITE_OTHER): Admitting: Psychology

## 2024-05-20 DIAGNOSIS — F411 Generalized anxiety disorder: Secondary | ICD-10-CM

## 2024-05-20 DIAGNOSIS — F330 Major depressive disorder, recurrent, mild: Secondary | ICD-10-CM | POA: Diagnosis not present

## 2024-05-20 NOTE — Progress Notes (Signed)
  Behavioral Health Counselor/Therapist Progress Note  Patient ID: Mary [name], MRN: [id number]    Date: 05/20/24  Time Spent: 10:01 - 10:39   : 38 minutes  Treatment Type: Individual Therapy.  Reported Symptoms: Feeling nervous and on edge, not being able to stop worrying, trouble relaxing, being restless, feeling afraid as if something awful might happen, trouble with sleep, feeling like let self down, and trouble concentrating.   Mental Status Exam: Appearance:  Well Groomed     Behavior: Appropriate  Motor: Normal  Speech/Language:  Pressured  Affect: Appropriate  Mood: normal  Thought process: normal  Thought content:   WNL  Sensory/Perceptual disturbances:   WNL  Orientation: oriented to person, place, and time/date  Attention: Good  Concentration: Good  Memory: WNL  Fund of knowledge:  Good  Insight:   Good  Judgment:  Good  Impulse Control: Good   Risk Assessment: Danger to Self:  No Self-injurious Behavior: No Danger to Others: No Duty to Warn:no Physical Aggression / Violence:No  Access to Firearms a concern: No  Gang Involvement:No   Subjective:   Mary [name] participated from car, via video, and consented to treatment. I discussed the limitations of evaluation and management by telemedicine and the availability of in person appointments. The patient expressed understanding and agreed to proceed.  Therapist participated from office.  Mary [name] reviewed the events of the past week.   Patient was very pleased with her neurologist and her new psychiatrist. Patient had forgotten that she had today's appt with this writer, but she made arrangements to be able to still attend today's appt. Patient expressed needing to spend quality time with Mary [name] to reconnect and we discussed the next steps to make that happen.   Interventions: Cognitive Behavioral Therapy  Diagnosis:  Major depressive disorder, recurrent episode, mild (HCC) [F33.0]  Generalized Anxiety  Disorder (41.1)  Psychiatric Treatment: Yes , via new psychiatrist  Treatment Plan:  Client Abilities/Strengths Mary [name] reports hopefulness, Self Advocacy, Ability to Communicate Effectively, and intelligence as her strengths .  Support System: significant other, Mary [name], and ex husband: Mary [name]   Client Treatment Preferences OPT  Client Statement of Needs Brentney would like to learn techniques to decrease her anxiety, got back to work even if it's part-time, build her trust in Mary [name], cut back on smoking cigarettes, decrease her worrying, and learn how to focus more effectively.     Treatment Level Weekly/Biweekly  Symptoms  Anxiety: feeling nervous and on edge, not being able to stop worrying, trouble relaxing, being restless, feeling afraid as if something awful might happen, Depression: trouble with sleep, feeling like let self down, and trouble concentrating.  Goals:   Mary [name] experiences symptoms of depression and anxiety   Target Date: 02/13/25 Frequency: Weekly/Biweekly  Progress: 0 Modality: individual    Therapist will provide referrals for additional resources as appropriate.  Therapist will provide psycho-education regarding Mary [name]'s diagnosis and corresponding treatment approaches and interventions. Mary [name] will support the patient's ability to achieve the goals identified. will employ CBT, BA, Problem-solving, Solution Focused, Mindfulness,  coping skills, & other evidenced-based practices will be used to promote progress towards healthy functioning to help manage decrease symptoms associated with their diagnosis.   Reduce overall level, frequency, and intensity of the feelings of depression, anxiety and panic evidenced by decreased overall symptoms from 6 to 7 days/week to 0 to 1 days/week per client report for at least 3 consecutive months. Verbally express understanding of the relationship between feelings of depression and anxiety  and their impact on thinking  patterns and behaviors. Verbalize an understanding of the role that distorted thinking plays in creating fears, excessive worry, and ruminations.  Mary [name] participated in the creation of the treatment plan)    Mary [name]

## 2024-05-26 ENCOUNTER — Ambulatory Visit (INDEPENDENT_AMBULATORY_CARE_PROVIDER_SITE_OTHER): Admitting: Psychology

## 2024-05-26 DIAGNOSIS — F330 Major depressive disorder, recurrent, mild: Secondary | ICD-10-CM

## 2024-05-26 DIAGNOSIS — F411 Generalized anxiety disorder: Secondary | ICD-10-CM | POA: Diagnosis not present
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
## 2024-05-29 ENCOUNTER — Encounter: Payer: Self-pay | Admitting: Neurology

## 2024-05-29 ENCOUNTER — Other Ambulatory Visit: Payer: Self-pay | Admitting: Neurology

## 2024-06-02 ENCOUNTER — Ambulatory Visit: Admitting: Psychology

## 2024-06-02 ENCOUNTER — Other Ambulatory Visit: Payer: Self-pay | Admitting: Neurology

## 2024-06-02 MED ORDER — ONDANSETRON 4 MG PO TBDP: 4.0000 mg | ORAL_TABLET | Freq: Three times a day (TID) | ORAL | 5 refills | Status: AC | PRN

## 2024-06-03 ENCOUNTER — Ambulatory Visit: Admitting: Psychology

## 2024-06-04 ENCOUNTER — Ambulatory Visit (INDEPENDENT_AMBULATORY_CARE_PROVIDER_SITE_OTHER): Admitting: Psychology

## 2024-06-04 DIAGNOSIS — F330 Major depressive disorder, recurrent, mild: Secondary | ICD-10-CM

## 2024-06-04 DIAGNOSIS — F411 Generalized anxiety disorder: Secondary | ICD-10-CM | POA: Diagnosis not present

## 2024-06-04 NOTE — Progress Notes (Signed)
 [facility] Counselor/Therapist Progress Note  Patient ID: Mary [name], MRN: [id number]    Date: 06/04/24  Time Spent: 1:00 pm - 1:53 pm : 53  minutes   Treatment Type: Individual Therapy.  Reported Symptoms: Feeling nervous and on edge, not being able to stop worrying, trouble relaxing, being restless, feeling afraid as if something awful might happen, trouble with sleep, feeling like let self down, and trouble concentrating.   Mental Status Exam: Appearance:  Casual     Behavior: Appropriate  Motor: Normal  Speech/Language:  Normal Rate  Affect: Appropriate  Mood: normal  Thought process: normal  Thought content:   WNL  Sensory/Perceptual disturbances:   WNL  Orientation: oriented to person, place, and time/date  Attention: Good  Concentration: Good  Memory: WNL  Fund of knowledge:  Good  Insight:   Good  Judgment:  Good  Impulse Control: Good   Risk Assessment: Danger to Self:  No Self-injurious Behavior: No Danger to Others: No Duty to Warn:no Physical Aggression / Violence:No  Access to Firearms a concern: No  Gang Involvement:No   Subjective:   Mary [name] participated from home, via video, and consented to treatment. I discussed the limitations of evaluation and management by telemedicine and the availability of in person appointments. The patient expressed understanding and agreed to proceed.  Therapist participated from home office.  Mary [name] reviewed the events of the past week.   Patient was appreciative of her upcoming follow up appt with  psychiatry. Patient shared specific instances from her childhood that have impacted her life as it is today.   Interventions: Cognitive Behavioral Therapy  Diagnosis: Major depressive disorder, recurrent episode, mild (HCC) [F33.0]  Generalized Anxiety Disorder (41.1)  Psychiatric Treatment: Yes , via psychiatrist  Treatment Plan:  Client Abilities/Strengths Mary [name] reports hopefulness, Self  Advocacy, Ability to Communicate Effectively, and intelligence as her strengths   Support System: significant other, [name], and ex husband: [name]   Client Treatment Preferences OPT  Client Statement of Needs Mary [name] would like to learn techniques to decrease her anxiety, got back to work even if it's part-time, build her trust in [employer], cut back on smoking cigarettes, decrease her worrying, and learn how to focus more effectively.    Treatment Level Weekly  Symptoms  Anxiety: feeling nervous and on edge, not being able to stop worrying, trouble relaxing, being restless, feeling afraid as if something awful might happen, Depression: trouble with sleep, feeling like let self down, and trouble concentrating.  Goals:   Mary [name] experiences symptoms of depression and anxiety.   Target Date: 02/13/25 Frequency: Weekly  Progress: 0 Modality: individual    Therapist will provide referrals for additional resources as appropriate.  Therapist will provide psycho-education regarding Mary [name]'s diagnosis and corresponding treatment approaches and interventions. Mary [name] will support the patient's ability to achieve the goals identified. will employ CBT, BA, Problem-solving, Solution Focused, Mindfulness,  coping skills, & other evidenced-based practices will be used to promote progress towards healthy functioning to help manage decrease symptoms associated with their diagnosis.   Reduce overall level, frequency, and intensity of the feelings of depression, anxiety and panic evidenced by decreased overall symptoms from 6 to 7 days/week to 0 to 1 days/week per client report for at least 3 consecutive months. Verbally express understanding of the relationship between feelings of depression, and anxiety and their impact on thinking patterns and behaviors. Verbalize an understanding of the role that distorted thinking plays in creating fears, excessive worry, and ruminations.  [name]  participated  in the creation of the treatment plan)    Mary [name]

## 2024-06-09 ENCOUNTER — Ambulatory Visit (INDEPENDENT_AMBULATORY_CARE_PROVIDER_SITE_OTHER): Admitting: Psychology

## 2024-06-09 DIAGNOSIS — F411 Generalized anxiety disorder: Secondary | ICD-10-CM

## 2024-06-09 DIAGNOSIS — F330 Major depressive disorder, recurrent, mild: Secondary | ICD-10-CM | POA: Diagnosis not present

## 2024-06-09 NOTE — Progress Notes (Unsigned)
 [facility] Counselor/Therapist Progress Note  Patient ID: Mary [name], MRN: [id number]    Date: 06/09/24  Time Spent: 11:00 am - 11:43 : 43 minutes  Treatment Type: Individual Therapy.  Reported Symptoms:  Feeling nervous and on edge, not being able to stop worrying, trouble relaxing, being restless, feeling afraid as if something awful might happen, trouble with sleep, feeling like let self down, and trouble concentrating.   Mental Status Exam Appearance:  Well Groomed     Behavior: Appropriate  Motor: Normal  Speech/Language:  Normal Rate  Affect: Appropriate  Mood: normal  Thought process: normal  Thought content:   WNL  Sensory/Perceptual disturbances:   WNL  Orientation: oriented to person, place, and time/date  Attention: Good  Concentration: Good  Memory: WNL  Fund of knowledge:  Good  Insight:   Good  Judgment:  Good  Impulse Control: Good   Risk Assessment: Danger to Self:  No Self-injurious Behavior: No Danger to Others: No Duty to Warn:no Physical Aggression / Violence:No  Access to Firearms a concern: No  Gang Involvement:No   Subjective:   Mary [name] participated from home, via video, and consented to treatment. I discussed the limitations of evaluation and management by telemedicine and the availability of in person appointments. The patient expressed understanding and agreed to proceed. Therapist participated from home office.  Mary [name] reviewed the events of the past week.   Patient reported that her significant other's mother has been slighting her lately, i.e. she typically brings patient back a t-shirt from the beach but this time she only brought patient back a  car coaster.   Interventions: Cognitive Behavioral Therapy  Diagnosis: Major depressive disorder, recurrent episode, mild (HCC) [F33.0], Generalized Anxiety Disorder (41.1)  Psychiatric Treatment: Yes , via psychiatrist  Treatment Plan:  Client  Abilities/Strengths Mary [name] reports hopefulness, Self Advocacy, Ability to Communicate Effectively, and intelligence as her strengths.  Support System: significant other, Mary [name], and ex husband: Mary [name]   Client Treatment Preferences OPT  Client Statement of Needs Mary [name] would like to learn techniques to decrease her anxiety, got back to work even if it's part-time, build her trust in [place], cut back on smoking cigarettes, decrease her worrying, and learn how to focus more effectively.     Treatment Level Weekly  Symptoms  Anxiety: feeling nervous and on edge, not being able to stop worrying, trouble relaxing, being restless, feeling afraid as if something awful might happen (Status Depression: trouble with sleep, feeling like let self down, and trouble concentrating.  Goals:   Mary experiences symptoms of ***   Target Date: *** Frequency: {Frequency of sessions.:26745}  Progress: 0 Modality: individual    Therapist will provide referrals for additional resources as appropriate.  Therapist will provide psycho-education regarding Mary [name]'s diagnosis and corresponding treatment approaches and interventions. [name] will support the patient's ability to achieve the goals identified. will employ CBT, BA, Problem-solving, Solution Focused, Mindfulness,  coping skills, & other evidenced-based practices will be used to promote progress towards healthy functioning to help manage decrease symptoms associated with {his/her/their:21314} diagnosis.   Reduce overall level, frequency, and intensity of the feelings of depression, anxiety and panic evidenced by decreased *** from 6 to 7 days/week to 0 to 1 days/week per client report for at least 3 consecutive months. Verbally express understanding of the relationship between feelings of ***depression, ***anxiety and their impact on thinking patterns and behaviors. Verbalize an understanding of the role that distorted thinking plays in  creating fears, excessive worry,  and ruminations.  [name] participated in the creation of the treatment plan)   Jenkins CHRISTELLA Maudie Jenkins CHRISTELLA Maudie

## 2024-06-12 ENCOUNTER — Other Ambulatory Visit: Payer: Self-pay

## 2024-06-12 ENCOUNTER — Ambulatory Visit: Admitting: General Practice

## 2024-06-12 VITALS — BP 113/88 | HR 84 | Ht 64.0 in | Wt 176.0 lb

## 2024-06-12 DIAGNOSIS — Z3042 Encounter for surveillance of injectable contraceptive: Secondary | ICD-10-CM

## 2024-06-12 MED ORDER — MEDROXYPROGESTERONE ACETATE 150 MG/ML IM SUSY
150.0000 mg | PREFILLED_SYRINGE | Freq: Once | INTRAMUSCULAR | Status: AC
  Administered 2024-06-12: 150 mg via INTRAMUSCULAR

## 2024-06-12 NOTE — Progress Notes (Signed)
 [name] here for Depo-Provera  Injection. Injection administered without complication. Patient will return in 3 months for next injection between 9/18 and 10/2. Next annual visit due January 2026.   Elenor Mole, RN 06/12/2024  3:48 PM

## 2024-06-16 ENCOUNTER — Ambulatory Visit (INDEPENDENT_AMBULATORY_CARE_PROVIDER_SITE_OTHER): Admitting: Psychology

## 2024-06-16 ENCOUNTER — Ambulatory Visit

## 2024-06-16 DIAGNOSIS — F330 Major depressive disorder, recurrent, mild: Secondary | ICD-10-CM

## 2024-06-16 DIAGNOSIS — F411 Generalized anxiety disorder: Secondary | ICD-10-CM | POA: Diagnosis not present

## 2024-06-16 NOTE — Progress Notes (Signed)
 [facility] Counselor/Therapist Progress Note  Patient ID: Mary [name], MRN: [id number]    Date: 06/16/24  Time Spent: 11:00 am - 11: 55 am :  55 minutes  Treatment Type: Individual Therapy.  Reported Symptoms: Feeling nervous and on edge, not being able to stop worrying, trouble relaxing, being restless, feeling afraid as if something awful might happen, trouble with sleep, feeling like let self down, and trouble concentrating.     Mental Status Exam: Appearance:  Casual     Behavior: Appropriate  Motor: Normal  Speech/Language:  Normal Rate  Affect: Appropriate  Mood: normal  Thought process: normal  Thought content:   WNL  Sensory/Perceptual disturbances:   WNL  Orientation: oriented to person, place, and time/date  Attention: Good  Concentration: Good  Memory: WNL  Fund of knowledge:  Good  Insight:   Good  Judgment:  Good  Impulse Control: Good   Risk Assessment: Danger to Self:  No Self-injurious Behavior: No Danger to Others: No Duty to Warn:no Physical Aggression / Violence:No  Access to Firearms a concern: No  Gang Involvement:No   Subjective:   Mary [name] participated from home, via video, and consented to treatment. I discussed the limitations of evaluation and management by telemedicine and the availability of in person appointments. The patient expressed understanding and agreed to proceed.  Therapist participated from home office.  Mary [name] reviewed the events of the past week.   Patient's psychiatrist Dr. Ray, wanted patient to talk to this writer about her fear of running out of medication. Patient also noted that she found 4-5 Clonazepan/Klonopin missing from her purse, which she realized was not a good decision. Patient reported that she had her first child when she was 38 years old. Pt referenced her [employer] account and the fact that they changed their name to [facility], and how frustrating that was for her.   Patient reported    Interventions: Cognitive Behavioral Therapy  Diagnosis:  Major depressive disorder, recurrent episode, mild (HCC) [F33.0]  GAD (generalized anxiety disorder) [F41.1]   Psychiatric Treatment: Yes , via psychiatrist  Treatment Plan:  Client Abilities/Strengths Mary [name] reports hopefulness, Self Advocacy, Ability to Communicate Effectively, and intelligence as her strengths.   Support System: Mary [name], significant other, and ex husband: Mary [name]  Client Treatment Preferences  Mary [name] reports hopefulness, Self Advocacy, Ability to Communicate Effectively, and intelligence as her strengths.   Client Statement of Needs Mary [name] would like to  learn techniques to decrease her anxiety, got back to work even if it's part-time, build her trust in [name], cut back on smoking cigarettes, decrease her worrying, and learn how to focus more effectively.       Treatment Level Weekly  Symptoms Anxiety: feeling nervous and on edge, not being able to stop worrying, trouble relaxing, being restless, feeling afraid as if something awful might happen (Status Depression: trouble with sleep, feeling like let self down, and trouble concentrating.  Goals:   Mary [name] experiences symptoms of depression and anxiety   Target Date: 02/13/25 Frequency: Weekly  Progress: 0 Modality: individual    Therapist will provide referrals for additional resources as appropriate.  Therapist will provide psycho-education regarding Mary [name]'s diagnosis and corresponding treatment approaches and interventions. Mary [name] will support the patient's ability to achieve the goals identified. will employ CBT, BA, Problem-solving, Solution Focused, Mindfulness,  coping skills, & other evidenced-based practices will be used to promote progress towards healthy functioning to help manage decrease symptoms associated with their diagnosis.   Reduce overall level,  frequency, and intensity of the feelings of depression, anxiety and panic  evidenced by decreased overall symptoms from 6 to 7 days/week to 0 to 1 days/week per client report for at least 3 consecutive months. Verbally express understanding of the relationship between feelings of depression and anxiety and their impact on thinking patterns and behaviors. Verbalize an understanding of the role that distorted thinking plays in creating fears, excessive worry, and ruminations.  [name] participated in the creation of the treatment plan)    Mary [name]

## 2024-06-17 ENCOUNTER — Ambulatory Visit: Admitting: Psychology

## 2024-06-23 ENCOUNTER — Encounter: Admitting: Psychology

## 2024-06-23 NOTE — Progress Notes (Signed)
 This encounter was created in error - please disregard.

## 2024-06-24 ENCOUNTER — Encounter: Payer: Self-pay | Admitting: Neurology

## 2024-06-24 DIAGNOSIS — M542 Cervicalgia: Secondary | ICD-10-CM

## 2024-06-25 ENCOUNTER — Ambulatory Visit (INDEPENDENT_AMBULATORY_CARE_PROVIDER_SITE_OTHER): Admitting: Psychology

## 2024-06-25 DIAGNOSIS — F330 Major depressive disorder, recurrent, mild: Secondary | ICD-10-CM | POA: Diagnosis not present

## 2024-06-25 DIAGNOSIS — F411 Generalized anxiety disorder: Secondary | ICD-10-CM

## 2024-06-25 NOTE — Progress Notes (Unsigned)
 [facility] Counselor/Therapist Progress Note  Patient ID: Mary [name], MRN: [id number]    Date: 06/25/24  Time Spent: 6:00 pm - 6:53 minutes  Treatment Type: Individual Therapy.  Reported Symptoms:  Feeling nervous and on edge, not being able to stop worrying, trouble relaxing, being restless, feeling afraid as if something awful might happen, trouble with sleep, feeling like let self down, and trouble concentrating.   Mental Status Exam: Appearance:  Casual     Behavior: Appropriate  Motor: Normal  Speech/Language:  Normal Rate  Affect: Appropriate  Mood: normal  Thought process: normal  Thought content:   WNL  Sensory/Perceptual disturbances:   WNL  Orientation: oriented to person, place, and time/date  Attention: Good  Concentration: Good  Memory: WNL  Fund of knowledge:  Good  Insight:   Good  Judgment:  Good  Impulse Control: Good   Risk Assessment: Danger to Self:  No Self-injurious Behavior: No Danger to Others: No Duty to Warn:no Physical Aggression / Violence:No  Access to Firearms a concern: No  Gang Involvement:No   Subjective:   [name] participated from home, via video, and consented to treatment. I discussed the limitations of evaluation and management by telemedicine and the availability of in person appointments. The patient expressed understanding and agreed to proceed.  Therapist participated from home office.  Desere reviewed the events of the past week.   Patient was feeling out of sorts and reported dizziness and her lower legs are numb. Patient is concerned that the dizziness was gone before, but now is back. Patient is also worried about trusting her doctors. Patient reported several new symptoms that she has been experiencing. Patient was relieved that her anxiety has been more manageable lately, but she just wants to feel normal again.   Interventions: Cognitive Behavioral Therapy  Diagnosis: Major depressive disorder,  recurrent episode, mild (HCC) [F33.0]  GAD (generalized anxiety disorder) [F41.1]    Psychiatric Treatment: Yes , via psychiatrist  Treatment Plan:  Client Abilities/Strengths [place] reports hopefulness, Self Advocacy, Ability to Communicate Effectively, and intelligence as her strengths.   Support System:  [name], significant other, and ex husband: [name]   Client Treatment Preferences OPT  Client Statement of Needs Terence would like to learn techniques to decrease her anxiety, got back to work even if it's part-time, build her trust in [place], cut back on smoking cigarettes, decrease her worrying, and learn how to focus more effectively.        Treatment Level Weekly  Symptoms  Anxiety: feeling nervous and on edge, not being able to stop worrying, trouble relaxing, being restless, feeling afraid as if something awful might happen (Status Depression: trouble with sleep, feeling like let self down, and trouble concentrating.  Goals:   Alexiana experiences symptoms of depression and anxiety.   Target Date: 02/13/25 Frequency: Weekly  Progress: 0 Modality: individual    Therapist will provide referrals for additional resources as appropriate.  Therapist will provide psycho-education regarding Addaline's diagnosis and corresponding treatment approaches and interventions. [name] will support the patient's ability to achieve the goals identified. will employ CBT, BA, Problem-solving, Solution Focused, Mindfulness,  coping skills, & other evidenced-based practices will be used to promote progress towards healthy functioning to help manage decrease symptoms associated with their diagnosis.   Reduce overall level, frequency, and intensity of the feelings of depression, anxiety and panic evidenced by decreased overall symptoms from 6 to 7 days/week to 0 to 1 days/week per client report for at least  3 consecutive months. Verbally express understanding of the relationship between  feelings of depression and anxiety and their impact on thinking patterns and behaviors. Verbalize an understanding of the role that distorted thinking plays in creating fears, excessive worry, and ruminations.  [name] participated in the creation of the treatment plan)    [name]

## 2024-06-30 ENCOUNTER — Ambulatory Visit: Admitting: Psychology

## 2024-07-01 ENCOUNTER — Ambulatory Visit: Admitting: Psychology

## 2024-07-03 ENCOUNTER — Encounter: Payer: Self-pay | Admitting: Obstetrics and Gynecology

## 2024-07-04 ENCOUNTER — Telehealth: Payer: Self-pay | Admitting: Neurology

## 2024-07-04 NOTE — Telephone Encounter (Signed)
 See my chart message

## 2024-07-04 NOTE — Telephone Encounter (Signed)
 Caller states she sent Mary [name] a message earlier b/c she got samples of medication and has questions. She is a little dizzy. She has a lot of stress due to her mother's passing. She has chiari malformation and was getting dizzy and migraines. Was on anxiety meds that weren't helping but states she is on klonopin which has helped. Still having extreme dizziness. She was prescribed Nurtec and has questions about dosing. Pt was advised of interactions for nurtec, klonopin and depoprovera

## 2024-07-07 ENCOUNTER — Ambulatory Visit: Admitting: Psychology

## 2024-07-07 ENCOUNTER — Other Ambulatory Visit: Payer: Self-pay | Admitting: Neurology

## 2024-07-07 DIAGNOSIS — Z79899 Other long term (current) drug therapy: Secondary | ICD-10-CM | POA: Diagnosis not present

## 2024-07-07 DIAGNOSIS — Z5181 Encounter for therapeutic drug level monitoring: Secondary | ICD-10-CM | POA: Diagnosis not present

## 2024-07-07 MED ORDER — NURTEC 75 MG PO TBDP: 1.0000 | ORAL_TABLET | Freq: Every day | ORAL | 5 refills | Status: AC | PRN

## 2024-07-09 ENCOUNTER — Encounter: Payer: Self-pay | Admitting: Family Medicine

## 2024-07-09 ENCOUNTER — Ambulatory Visit (INDEPENDENT_AMBULATORY_CARE_PROVIDER_SITE_OTHER): Admitting: Psychology

## 2024-07-09 DIAGNOSIS — F330 Major depressive disorder, recurrent, mild: Secondary | ICD-10-CM

## 2024-07-09 DIAGNOSIS — F411 Generalized anxiety disorder: Secondary | ICD-10-CM

## 2024-07-09 DIAGNOSIS — R002 Palpitations: Secondary | ICD-10-CM

## 2024-07-09 DIAGNOSIS — R0789 Other chest pain: Secondary | ICD-10-CM

## 2024-07-09 NOTE — Progress Notes (Unsigned)
 [facility] Counselor/Therapist Progress Note  Patient ID: Mary [name], MRN: [id number]    Date: 07/09/24  Time Spent: 6:00 pm - 6: 44 pm : 44 minutes  Treatment Type: Individual Therapy.  Reported Symptoms:  Feeling nervous and on edge, not being able to stop worrying, trouble relaxing, being restless, feeling afraid as if something awful might happen, trouble with sleep, feeling like let self down, and trouble concentrating.   Mental Status Exam: Appearance:  Well Groomed     Behavior: Appropriate  Motor: Normal  Speech/Language:  Normal Rate  Affect: Appropriate  Mood: anxious  Thought process: normal  Thought content:   WNL  Sensory/Perceptual disturbances:   WNL  Orientation: oriented to person, place, and time/date  Attention: Good  Concentration: Good  Memory: WNL  Fund of knowledge:  Good  Insight:   Fair  Judgment:  Good  Impulse Control: Good   Risk Assessment: Danger to Self:  No Self-injurious Behavior: No Danger to Others: No Duty to Warn:no Physical Aggression / Violence:No  Access to Firearms a concern: No  Gang Involvement:No   Subjective:   Mary [name] participated from home, via video, and consented to treatment. I discussed the limitations of evaluation and management by telemedicine and the availability of in person appointments. The patient expressed understanding and agreed to proceed.  Therapist participated from home office.  Mary [name] reviewed the events of the past week.   Patient reported feeling very tired and feeling as though she is anemic. Patient reported that her Feratin results show her as 5.4 and the range should be 10.0 or higher. Patient was tearful as she talked about how tired she felt. Patient was receptive to following a plan we discussed to increase her iron in an effort to be less tired. Patient was able to talk about some bright spots in her life like spending time at her son's baby shower recently. That really  brought joy to her life. Patient has been unable to stop worrying, and she agreed to practice some anxiety reduction techniques.   Interventions: Cognitive Behavioral Therapy  Diagnosis:  Major depressive disorder, recurrent episode, mild (HCC) [F33.0]  GAD (generalized anxiety disorder) [F41.1]   Psychiatric Treatment: Yes , via psychiatrist  Treatment Plan:  Client Abilities/Strengths Mary [name] reports hopefulness, Self Advocacy, Ability to Communicate Effectively, and intelligence as her strengths.   Support System: Mary [name], significant other, and ex husband: [name]   Client Treatment Preferences OPT  Client Statement of Needs Mary [name] would like to  learn techniques to decrease her anxiety, got back to work even if it's part-time, build her trust in [place], cut back on smoking cigarettes, decrease her worrying, and learn how to focus more effectively.      Treatment Level Weekly  Symptoms  Mary [name] experiences symptoms of depression and anxiety.  Anxiety: feeling nervous and on edge, not being able to stop worrying, trouble relaxing, being restless, feeling afraid as if something awful might happen (Status Depression: trouble with sleep, feeling like let self down, and trouble concentrating.   Goals:   Target Date: 02/13/25 Frequency: Weekly  Progress: 0 Modality: individual    Therapist will provide referrals for additional resources as appropriate.  Therapist will provide psycho-education regarding Mary [name]'s diagnosis and corresponding treatment approaches and interventions. [name] will support the patient's ability to achieve the goals identified. will employ CBT, BA, Problem-solving, Solution Focused, Mindfulness,  coping skills, & other evidenced-based practices will be used to promote progress towards healthy functioning to help manage  decrease symptoms associated with their diagnosis.   Reduce overall level, frequency, and intensity of the feelings of depression,  anxiety and panic evidenced by decreased overall symptoms from 6 to 7 days/week to 0 to 1 days/week per client report for at least 3 consecutive months. Verbally express understanding of the relationship between feelings of depression and anxiety and their impact on thinking patterns and behaviors. Verbalize an understanding of the role that distorted thinking plays in creating fears, excessive worry, and ruminations.  [name] participated in the creation of the treatment plan)    [name]

## 2024-07-10 ENCOUNTER — Ambulatory Visit: Admitting: Physical Therapy

## 2024-07-10 ENCOUNTER — Telehealth: Payer: Self-pay | Admitting: Pharmacy Technician

## 2024-07-10 ENCOUNTER — Other Ambulatory Visit (HOSPITAL_COMMUNITY): Payer: Self-pay

## 2024-07-10 NOTE — Telephone Encounter (Signed)
 Pharmacy Patient Advocate Encounter   Received notification from CoverMyMeds that prior authorization for NURTEC 75MG  is required/requested.   Insurance verification completed.   The patient is insured through [facility] .   Per test claim: PA required; PA submitted to above mentioned insurance via CoverMyMeds Key/confirmation #/EOC [id number] Status is pending

## 2024-07-14 ENCOUNTER — Ambulatory Visit: Admitting: Psychology

## 2024-07-14 ENCOUNTER — Encounter: Payer: Self-pay | Admitting: Internal Medicine

## 2024-07-14 DIAGNOSIS — D509 Iron deficiency anemia, unspecified: Secondary | ICD-10-CM

## 2024-07-15 ENCOUNTER — Ambulatory Visit: Admitting: Psychology

## 2024-07-15 ENCOUNTER — Ambulatory Visit (INDEPENDENT_AMBULATORY_CARE_PROVIDER_SITE_OTHER): Admitting: Psychology

## 2024-07-15 DIAGNOSIS — F330 Major depressive disorder, recurrent, mild: Secondary | ICD-10-CM

## 2024-07-15 DIAGNOSIS — F411 Generalized anxiety disorder: Secondary | ICD-10-CM

## 2024-07-15 NOTE — Progress Notes (Unsigned)
 [facility] Counselor/Therapist Progress Note  Patient ID: Mary [name], MRN: [id number]    Date: 07/15/24  Time Spent: 5:00 pm - 5:55 pm :55 minutes (53 minutes were video and 2 minutes were by phone due to patient having technical issues for the last few minutes of our session.)  Treatment Type: Individual Therapy.  Reported Symptoms:  Feeling nervous and on edge, not being able to stop worrying, trouble relaxing, being restless, feeling afraid as if something awful might happen, trouble with sleep, feeling like let self down, and trouble concentrating.     Mental Status Exam: Appearance:  Well Groomed     Behavior: Appropriate  Motor: Normal  Speech/Language:  Normal Rate  Affect: Appropriate  Mood: normal  Thought process: normal  Thought content:   WNL  Sensory/Perceptual disturbances:   WNL  Orientation: oriented to person, place, and time/date  Attention: Good  Concentration: Good  Memory: WNL  Fund of knowledge:  Good  Insight:   Good  Judgment:  Good  Impulse Control: Good   Risk Assessment: Danger to Self:  No Self-injurious Behavior: No Danger to Others: No Duty to Warn:no Physical Aggression / Violence:No  Access to Firearms a concern: No  Gang Involvement:No   Subjective:   Mary [name] participated from home, via video, and consented to treatment. I discussed the limitations of evaluation and management by telemedicine and the availability of in person appointments. The patient expressed understanding and agreed to proceed.  Therapist participated from home office.  Mary [name] reviewed the events of the past week.   Patient felt physically better during this session. Patient continues to have concerns bout her medical issues, and we discussed ways for her to be able to stop worrying.   Interventions: Cognitive Behavioral Therapy  Diagnosis:  Major depressive disorder, recurrent episode, mild (HCC) [F33.0]  GAD (generalized anxiety disorder)  [F41.1]   Psychiatric Treatment: Yes , via psychiatrist  Treatment Plan:  Client Abilities/Strengths Mary [name] reports hopefulness, Self Advocacy, Ability to Communicate Effectively, and intelligence as her strengths.   Support System: Mary [name] significant other, and Mary [name] ex husband.   Client Treatment Preferences OPT  Client Statement of Needs Mary [name] would like to  learn techniques to decrease her anxiety, got back to work even if it's part-time, build her trust in [place], cut back on smoking cigarettes, decrease her worrying, and learn how to focus more effectively.         Treatment Level Weekly  Symptoms  Mary [name] experiences symptoms of depression and anxiety.  Anxiety: feeling nervous and on edge, not being able to stop worrying, trouble relaxing, being restless, feeling afraid as if something awful might happen (Status:maintained). Depression: trouble with sleep, feeling like let self down, and trouble concentrating. (Status:maintained).  Goals:  Target Date: 02/13/25 Frequency:  Weekly  Progress: 0 Modality: individual    Therapist will provide referrals for additional resources as appropriate.  Therapist will provide psycho-education regarding Mary [name]'s diagnosis and corresponding treatment approaches and interventions. [name] will support the patient's ability to achieve the goals identified. will employ CBT, BA, Problem-solving, Solution Focused, Mindfulness,  coping skills, & other evidenced-based practices will be used to promote progress towards healthy functioning to help manage decrease symptoms associated with their diagnosis.   Reduce overall level, frequency, and intensity of the feelings of depression, anxiety and panic evidenced by decreased overall symptoms from 6 to 7 days/week to 0 to 1 days/week per client report for at least 3 consecutive months. Verbally express understanding of  the relationship between feelings of depression and anxiety and their  impact on thinking patterns and behaviors. Verbalize an understanding of the role that distorted thinking plays in creating fears, excessive worry, and ruminations.  [name] participated in the creation of the treatment plan)    [name]

## 2024-07-16 ENCOUNTER — Encounter: Payer: Self-pay | Admitting: Obstetrics and Gynecology

## 2024-07-16 NOTE — Telephone Encounter (Signed)
 Pharmacy Patient Advocate Encounter  Received notification from [facility] Medicare Part D that Prior Authorization for Nurtec 75MG  dispersible tablets has been APPROVED from 07-10-2024 to 12-10-2024   PA #/Case ID/Reference #: [id number]

## 2024-07-18 ENCOUNTER — Ambulatory Visit
Admission: EM | Admit: 2024-07-18 | Discharge: 2024-07-18 | Disposition: A | Discharge status: Home / Self Care | Attending: Family Medicine | Admitting: Family Medicine

## 2024-07-18 DIAGNOSIS — J3089 Other allergic rhinitis: Secondary | ICD-10-CM

## 2024-07-18 DIAGNOSIS — J4521 Mild intermittent asthma with (acute) exacerbation: Secondary | ICD-10-CM

## 2024-07-18 MED ORDER — ALBUTEROL SULFATE HFA 108 (90 BASE) MCG/ACT IN AERS: 2.0000 | INHALATION_SPRAY | RESPIRATORY_TRACT | 0 refills | Status: AC | PRN

## 2024-07-18 MED ORDER — AZELASTINE HCL 0.1 % NA SOLN: 1.0000 | Freq: Two times a day (BID) | NASAL | 2 refills | Status: AC

## 2024-07-18 NOTE — Discharge Instructions (Signed)
 Restart your Singulair , use Astelin  nasal spray twice daily in addition to your allergy pill and may do saline sinus rinses, humidifiers.  I have also sent a refill of your albuterol  inhaler that you may use every 4 hours as needed.  Follow-up for worsening or unresolving symptoms.

## 2024-07-18 NOTE — ED Provider Notes (Signed)
 [facility]    CSN: [id number] Arrival date & time: 07/18/24  1311      History   Chief Complaint No chief complaint on file.   HPI Mary [name] is a 38 y.o. female.   Patient presenting today with about 3 days of sinus drainage, headache not improved with her Nurtec, cough, chest congestion and tightness.  Denies fever, chills, chest pain, shortness of breath, abdominal pain, vomiting, diarrhea.  So far trying to drink more fluids and eat lots of fruit with minimal relief.  History of seasonal allergies and asthma off her regimen for both.  Has not tried her albuterol  yet since symptoms started.  Requesting a refill of this.    Past Medical History:  Diagnosis Date   Asthma    Atypical squamous cell changes of undetermined significance (ASCUS) on cervical cytology with positive high risk human papilloma virus (HPV) 07/08/2013   Overview:   ASCUS with positive high risk HPV 07/08/13  Mild to moderate dysplasia on colpo 02/10/14   Plan for repeat colpo in 6 months (Due 08/13/14) schedulers flagged.  ASCUS with positive high risk HPV 07/08/13  Mild to moderate dysplasia on colpo 02/10/14   Plan for repeat colpo in 6 months (Due 08/13/14) schedulers flagged.   Bronchitis 01/31/2016   Chiari malformation type I (HCC)    Depression    GERD (gastroesophageal reflux disease)    H/O multiple concussions    Headache(784.0)    History of palpitations    Hx of fracture of nose    Mental disorder    Multiple fractures    Hx: of a leg and arm fracture as a child   Pneumonia    PONV (postoperative nausea and vomiting)    Post traumatic stress disorder (PTSD)    Pregnant 01/21/2016   PTSD (post-traumatic stress disorder)    Sinus infection 01/31/2016   Supervision of normal pregnancy, antepartum 02/08/2016    [facility] at             13+1 week FOB   [name] Daily 38 yo WM Dating By  LMP and US  Pap  02/08/16 GC/CT Initial:                36+wks: Genetic  Screen NT/IT:  CF screen  Anatomic US   Flu vaccine  Tdap Recommended ~ 28wks Glucose Screen  2 hr GBS  Feed Preference  Contraception  Circumcision  Childbirth Classes  Pediatrician      Patient Active Problem List   Diagnosis Date Noted   Major depressive disorder, recurrent episode, mild (HCC) 04/08/2024   GAD (generalized anxiety disorder) 01/16/2024   Vertigo 12/31/2023   Atypical chest pain 09/25/2022   Migraine with aura and without status migrainosus 07/24/2022   History of posttraumatic stress disorder (PTSD) 04/25/2022   Vitamin D  deficiency 09/11/2019   Hyperlipidemia 09/11/2019   Palpitations 07/30/2018   Chiari I malformation (HCC) 02/13/2018   Current smoker 04/20/2017   Asthma 06/04/2013   Depression 06/04/2013    Past Surgical History:  Procedure Laterality Date   botox injections     colposcopy     DILATION AND CURETTAGE OF UTERUS     nexplanon     SUBOCCIPITAL CRANIECTOMY CERVICAL LAMINECTOMY N/A 12/16/2018   Procedure: Cervical one Laminectomy/Chiari decompression;  Surgeon: Onetha Kuba, MD;  Location: [facility] OR;  Service: Neurosurgery;  Laterality: N/A;   WISDOM TOOTH EXTRACTION      OB History     [place]  6   Para  5   Term  5   Preterm      AB  1   Living  5      SAB  1   IAB      Ectopic      Multiple  0   Live Births  5            Home Medications    Prior to Admission medications   Medication Sig Start Date End Date Taking? Authorizing Provider  albuterol  (VENTOLIN  HFA) 108 (90 Base) MCG/ACT inhaler Inhale 2 puffs into the lungs every 4 (four) hours as needed. 07/18/24  Yes Stuart Vernell Norris, PA-C  azelastine  (ASTELIN ) 0.1 % nasal spray Place 1 spray into both nostrils 2 (two) times daily. Use in each nostril as directed 07/18/24  Yes Stuart Vernell Norris, PA-C  Rimegepant Sulfate (NURTEC) 75 MG TBDP Take 1 tablet (75 mg total) by mouth daily as needed. 07/07/24   Skeet Juliene SAUNDERS, DO  acetaZOLAMIDE  (DIAMOX ) 250 MG tablet Take 1  tablet (250 mg total) by mouth 2 (two) times daily. Patient not taking: Reported on 06/12/2024 05/16/24   Skeet Juliene SAUNDERS, DO  albuterol  (VENTOLIN  HFA) 108 (90 Base) MCG/ACT inhaler Inhale 2 puffs into the lungs every 6 (six) hours as needed for wheezing or shortness of breath. 08/16/22   Theophilus Andrews, Tully GRADE, MD  ALPRAZolam  (XANAX ) 0.25 MG tablet Take 1 tablet (0.25 mg total) by mouth daily as needed for anxiety. 04/01/24   Rankin, Shuvon B, NP  aspirin-acetaminophen -caffeine  (EXCEDRIN MIGRAINE) 250-250-65 MG tablet Take by mouth every 6 (six) hours as needed for headache. Patient not taking: Reported on 06/12/2024    [provider]  clonazePAM (KLONOPIN) 0.5 MG tablet Take 0.5 mg by mouth 2 (two) times daily. 06/11/24   [provider]  EPINEPHrine  (EPIPEN  2-PAK) 0.3 mg/0.3 mL IJ SOAJ injection Inject 0.3 mg into the muscle as needed for anaphylaxis. 09/17/23   Johnny Garnette LABOR, MD  fluticasone  (FLONASE ) 50 MCG/ACT nasal spray Place 1 spray into both nostrils daily as needed for allergies or rhinitis.    [provider]  gabapentin  (NEURONTIN ) 100 MG capsule Take 100 mg by mouth daily. Patient not taking: Reported on 06/12/2024 06/11/24   [provider]  ibuprofen  (ADVIL ) 800 MG tablet Take 800 mg by mouth every 8 (eight) hours as needed. 02/26/24   [provider]  montelukast  (SINGULAIR ) 10 MG tablet Take 1 tablet (10 mg total) by mouth at bedtime. 04/22/24   Theophilus Andrews, Tully GRADE, MD  ondansetron  (ZOFRAN -ODT) 4 MG disintegrating tablet Take 1 tablet (4 mg total) by mouth every 8 (eight) hours as needed. 06/02/24   Jaffe, Adam R, DO  Vitamin D , Ergocalciferol , (DRISDOL ) 1.25 MG (50000 UNIT) CAPS capsule Take 1 capsule (50,000 Units total) by mouth every 7 (seven) days. 01/17/24   Theophilus Andrews, Tully GRADE, MD    Family History Family History  Problem Relation Age of Onset   Breast cancer Mother        97s   Cancer Mother 36       breast   Hypertension  Mother    Depression Mother    Depression Father    Seizures Father    Cancer Maternal Grandmother        breast and cervical   Drug abuse Paternal Grandmother    Alcohol abuse Paternal Grandmother    Breast cancer Cousin 29       paternal  first cousin   Asthma Son    Asthma Son    Hypertension Other    Cancer Other     Social History Social History   Tobacco Use   Smoking status: Some Days    Current packs/day: 0.50    Types: Cigarettes   Smokeless tobacco: Never   Tobacco comments:    NEVER USE SNUFF OR CHEWING TOBACCO  Vaping Use   Vaping status: Never Used  Substance Use Topics   Alcohol use: Not Currently    Comment: social   Drug use: No     Allergies   Codeine , Haemophilus influenzae vaccines, Influenza a (h1n1) monovalent vaccine, Sulfa antibiotics, Egg-derived products, Lexapro  [escitalopram ], Peanut-containing drug products, and Robaxin  [methocarbamol ]   Review of Systems Review of Systems Per HPI  Physical Exam Triage Vital Signs ED Triage Vitals  Encounter Vitals Group     BP 07/18/24 1328 111/81     Girls Systolic BP Percentile --      Girls Diastolic BP Percentile --      Boys Systolic BP Percentile --      Boys Diastolic BP Percentile --      Pulse Rate 07/18/24 1328 (!) 112     Resp 07/18/24 1328 20     Temp 07/18/24 1328 98.6 F (37 C)     Temp Source 07/18/24 1328 Oral     SpO2 07/18/24 1328 97 %     Weight --      Height --      Head Circumference --      Peak Flow --      Pain Score 07/18/24 1331 0     Pain Loc --      Pain Education --      Exclude from Growth Chart --    No data found.  Updated Vital Signs BP 113/78 (BP Location: Right Arm)   Pulse 98   Temp 98.6 F (37 C) (Oral)   Resp 20   SpO2 95%   Breastfeeding No   Visual Acuity Right Eye Distance:   Left Eye Distance:   Bilateral Distance:    Right Eye Near:   Left Eye Near:    Bilateral Near:     Physical Exam Vitals and nursing note reviewed.   Constitutional:      Appearance: Normal appearance.  HENT:     Head: Atraumatic.     Right Ear: Tympanic membrane and external ear normal.     Left Ear: Tympanic membrane and external ear normal.     Nose: Rhinorrhea present.     Mouth/Throat:     Mouth: Mucous membranes are moist.     Pharynx: Posterior oropharyngeal erythema present.  Eyes:     Extraocular Movements: Extraocular movements intact.     Conjunctiva/sclera: Conjunctivae normal.  Cardiovascular:     Rate and Rhythm: Normal rate and regular rhythm.     Heart sounds: Normal heart sounds.  Pulmonary:     Effort: Pulmonary effort is normal.     Breath sounds: Normal breath sounds. No wheezing or rales.  Musculoskeletal:        General: Normal range of motion.     Cervical back: Normal range of motion and neck supple.  Skin:    General: Skin is warm and dry.  Neurological:     Mental Status: She is alert and oriented to person, place, and time.  Psychiatric:        Mood and Affect: Mood normal.  Thought Content: Thought content normal.      UC Treatments / Results  Labs (all labs ordered are listed, but only abnormal results are displayed) Labs Reviewed - No data to display  EKG   Radiology No results found.  Procedures Procedures (including critical care time)  Medications Ordered in UC Medications - No data to display  Initial Impression / Assessment and Plan / UC Course  I have reviewed the triage vital signs and the nursing notes.  Pertinent labs & imaging results that were available during my care of the patient were reviewed by me and considered in my medical decision making (see chart for details).     Exam reassuring today, she is very well-appearing, in no acute distress and speaking in full sentences.  Suspect seasonal allergy and asthma exacerbation particularly in the setting of her being off of all of her medications for this.  Restart Singulair , Flonase , add Astelin , albuterol   for as needed use and discussed supportive over-the-counter medications, home care and return precautions.  Chest x-ray deferred with shared decision making today.  Final Clinical Impressions(s) / UC Diagnoses   Final diagnoses:  Seasonal allergic rhinitis due to other allergic trigger  Mild intermittent asthma with acute exacerbation     Discharge Instructions      Restart your Singulair , use Astelin  nasal spray twice daily in addition to your allergy pill and may do saline sinus rinses, humidifiers.  I have also sent a refill of your albuterol  inhaler that you may use every 4 hours as needed.  Follow-up for worsening or unresolving symptoms.    ED Prescriptions     Medication Sig Dispense Auth. Provider   azelastine  (ASTELIN ) 0.1 % nasal spray Place 1 spray into both nostrils 2 (two) times daily. Use in each nostril as directed 30 mL Stuart Vernell Norris, PA-C   albuterol  (VENTOLIN  HFA) 108 (90 Base) MCG/ACT inhaler Inhale 2 puffs into the lungs every 4 (four) hours as needed. 18 g Stuart Vernell Norris, [place]      PDMP not reviewed this encounter.   Stuart Vernell Norris, [place] 07/18/24 1429

## 2024-07-18 NOTE — ED Triage Notes (Signed)
 Pt reports sinus drainage, migraine, cough, chest congestion, x 3 days feels like mucus is in the chest has been trying to stay hydrated, and using fruits to help fight sx's pt has found no relief.

## 2024-07-21 ENCOUNTER — Other Ambulatory Visit: Payer: Self-pay

## 2024-07-21 ENCOUNTER — Ambulatory Visit: Admitting: Psychology

## 2024-07-21 ENCOUNTER — Ambulatory Visit: Attending: Neurology | Admitting: Physical Therapy

## 2024-07-21 DIAGNOSIS — M542 Cervicalgia: Secondary | ICD-10-CM | POA: Insufficient documentation

## 2024-07-21 DIAGNOSIS — M62838 Other muscle spasm: Secondary | ICD-10-CM | POA: Diagnosis not present

## 2024-07-21 NOTE — Therapy (Signed)
 OUTPATIENT PHYSICAL THERAPY CERVICAL EVALUATION   Patient Name: Mary [name] MRN: [id number] DOB:06/16/1986, 38 y.o., female Today's Date: 07/21/2024  END OF SESSION:  PT End of Session - 07/21/24 1136     Visit Number 1    Number of Visits 12    Date for PT Re-Evaluation 08/25/24    PT Start Time 1103    PT Stop Time 1158    PT Time Calculation (min) 55 min    Activity Tolerance Patient tolerated treatment well    Behavior During Therapy WFL for tasks assessed/performed          Past Medical History:  Diagnosis Date   Asthma    Atypical squamous cell changes of undetermined significance (ASCUS) on cervical cytology with positive high risk human papilloma virus (HPV) 07/08/2013   Overview:   ASCUS with positive high risk HPV 07/08/13  Mild to moderate dysplasia on colpo 02/10/14   Plan for repeat colpo in 6 months (Due 08/13/14) schedulers flagged.  ASCUS with positive high risk HPV 07/08/13  Mild to moderate dysplasia on colpo 02/10/14   Plan for repeat colpo in 6 months (Due 08/13/14) schedulers flagged.   Bronchitis 01/31/2016   Chiari malformation type I (HCC)    Depression    GERD (gastroesophageal reflux disease)    H/O multiple concussions    Headache(784.0)    History of palpitations    Hx of fracture of nose    Mental disorder    Multiple fractures    Hx: of a leg and arm fracture as a child   Pneumonia    PONV (postoperative nausea and vomiting)    Post traumatic stress disorder (PTSD)    Pregnant 01/21/2016   PTSD (post-traumatic stress disorder)    Sinus infection 01/31/2016   Supervision of normal pregnancy, antepartum 02/08/2016    [facility] at             13+1 week FOB   [name] Daily 38 yo WM Dating By  LMP and US  Pap  02/08/16 GC/CT Initial:                36+wks: Genetic Screen NT/IT:  CF screen  Anatomic US   Flu vaccine  Tdap Recommended ~ 28wks Glucose Screen  2 hr GBS  Feed Preference  Contraception  Circumcision  Childbirth Classes   Pediatrician     Past Surgical History:  Procedure Laterality Date   botox injections     colposcopy     DILATION AND CURETTAGE OF UTERUS     nexplanon     SUBOCCIPITAL CRANIECTOMY CERVICAL LAMINECTOMY N/A 12/16/2018   Procedure: Cervical one Laminectomy/Chiari decompression;  Surgeon: Onetha Kuba, MD;  Location: [facility] OR;  Service: Neurosurgery;  Laterality: N/A;   WISDOM TOOTH EXTRACTION     Patient Active Problem List   Diagnosis Date Noted   Major depressive disorder, recurrent episode, mild (HCC) 04/08/2024   GAD (generalized anxiety disorder) 01/16/2024   Vertigo 12/31/2023   Atypical chest pain 09/25/2022   Migraine with aura and without status migrainosus 07/24/2022   History of posttraumatic stress disorder (PTSD) 04/25/2022   Vitamin D  deficiency 09/11/2019   Hyperlipidemia 09/11/2019   Palpitations 07/30/2018   Chiari I malformation (HCC) 02/13/2018   Current smoker 04/20/2017   Asthma 06/04/2013   Depression 06/04/2013   REFERRING PROVIDER: Juliene Dunnings DO  REFERRING DIAG: Cervicalgia.  THERAPY DIAG:  Cervicalgia - Plan: PT plan of care cert/re-cert  Other muscle spasm - Plan: PT  plan of care cert/re-cert  Rationale for Evaluation and Treatment: Rehabilitation  ONSET DATE: Ongoing.   SUBJECTIVE:                                                                                                                                                                                                         SUBJECTIVE STATEMENT: The patient presents to the clinic with c/o chronic neck pain and daily headaches and migraines.  Her resting pain-level is a 4/10 today but can rise to much higher levels.  She states she tends to carry stress in her neck.  Heat, ice and rest decrease pain.    PERTINENT HISTORY:  Please see above.  Chiari I malformation and subsequent surgery.    PAIN:  Are you having pain? Yes: NPRS scale: 4-4/10.   Pain location: Bilateral neck.   Pain description:  Ache, throb and sharp. Aggravating factors: Stress. Relieving factors: As above.   PRECAUTIONS: Chiari I malformation and subsequent surgery.     RED FLAGS: None     WEIGHT BEARING RESTRICTIONS: No  FALLS:  Has patient fallen in last 6 months? No  LIVING ENVIRONMENT: Lives with: lives with their family Lives in: House/apartment Has following equipment at home: None  OCCUPATION: Disabled.    PLOF: Independent  PATIENT GOALS: Reduce pain, less intense and frequent of headache.     OBJECTIVE:   PATIENT SURVEYS:  NDI:  28/50.  POSTURE: Forward head and rounded shoulder.  PALPATION:    Diffuse tenderness over bilateral cervical paraspinal musculature.  Bilateral UT's exhibits significant tone, left > right.    CERVICAL ROM:   Right active cervical rotation is 72 degrees and left is 65 degrees.  UPPER EXTREMITY ROM:  WNL.  UPPER EXTREMITY MMT:  Bilateral deltoid strength is 4 to 4+/5.  Normal bilateral elbow strength.   TREATMENT DATE: IFC at 80-150 Hz on 40% scan x 20 minutes f/b STW/M x 8 minutes to bilateral UT's with ischemic release utilized.    Normal modality response following removal of modality  PATIENT EDUCATION:  Education details:  Person educated:  International aid/development worker:  Education comprehension:   HOME EXERCISE PROGRAM:   ASSESSMENT:  CLINICAL IMPRESSION: The patient presents to OPPT with chronic neck pain and headaches.  She is diffusely tender over bilateral cervical paraspinal musculature.  Bilateral UT's exhibits significant tone, left > right.  Her NDI score is 28/50.  She has some limitation into bilateral active cervical rotation and some shoulder weakness.   Patient will benefit from skilled PT intervention to address pain and deficits.   OBJECTIVE IMPAIRMENTS: decreased activity tolerance, increased muscle  spasms, postural dysfunction, and pain.   ACTIVITY LIMITATIONS: carrying, lifting, and sitting  PARTICIPATION LIMITATIONS: meal prep, cleaning, and laundry  PERSONAL FACTORS: Time since onset of injury/illness/exacerbation and 1 comorbidity: Chiari malformation and surgery are also affecting patient's functional outcome.   REHAB POTENTIAL: Good  CLINICAL DECISION MAKING: Stable/uncomplicated  EVALUATION COMPLEXITY: Low   GOALS:  SHORT TERM GOALS: Target date: 08/04/24  Ind with a HEP.  Goal status: INITIAL   LONG TERM GOALS: Target date: 09/01/24.  Improve NDI by at least 5 points.  Goal status: INITIAL  2.  Decrease headaches to no more than 1-2 per week.  Goal status: INITIAL  3.  Improve bilateral cervical rotation to 75-80 degrees so she can turn her head more easily while driving.  Goal status: INITIAL  4.  Perform ADL's with pain not > 3-4/10.  Goal status: INITIAL   PLAN:  PT FREQUENCY: 2x/week  PT DURATION: 6 weeks  PLANNED INTERVENTIONS: 97110-Therapeutic exercises, 97530- Therapeutic activity, [id number]- Neuromuscular re-education, 97535- Self Care, [id number]- Manual therapy, [id number]- Electrical stimulation (unattended), 97035- Ultrasound, 79439 (1-2 muscles), 20561 (3+ muscles)- Dry Needling, Patient/Family education, Cryotherapy, and Moist heat  PLAN FOR NEXT SESSION: Combo e'stim/US , STW/M, postural exercises.    Marguita Venning, [place], PT 07/21/2024, 12:10 PM

## 2024-07-23 ENCOUNTER — Ambulatory Visit: Admitting: *Deleted

## 2024-07-23 ENCOUNTER — Ambulatory Visit (INDEPENDENT_AMBULATORY_CARE_PROVIDER_SITE_OTHER): Admitting: Psychology

## 2024-07-23 ENCOUNTER — Encounter: Payer: Self-pay | Admitting: *Deleted

## 2024-07-23 DIAGNOSIS — M542 Cervicalgia: Secondary | ICD-10-CM | POA: Diagnosis not present

## 2024-07-23 DIAGNOSIS — M62838 Other muscle spasm: Secondary | ICD-10-CM | POA: Diagnosis not present

## 2024-07-23 DIAGNOSIS — F330 Major depressive disorder, recurrent, mild: Secondary | ICD-10-CM | POA: Diagnosis not present

## 2024-07-23 DIAGNOSIS — F411 Generalized anxiety disorder: Secondary | ICD-10-CM | POA: Diagnosis not present

## 2024-07-23 NOTE — Progress Notes (Signed)
 [facility] Counselor/Therapist Progress Note  Patient ID: Mary [name], MRN: [id number]    Date: 07/23/24  Time Spent: 6:00 pm - 6:53 pm : 53 minutes    Treatment Type: Individual Therapy.  Reported Symptoms: Feeling nervous and on edge, not being able to stop worrying, trouble relaxing, being restless, feeling afraid as if something awful might happen, trouble with sleep, feeling like let self down, and trouble concentrating.    Mental Status Exam: Appearance:  Well Groomed     Behavior: Appropriate  Motor: Normal  Speech/Language:  Normal Rate  Affect: Appropriate  Mood: normal  Thought process: normal  Thought content:   WNL  Sensory/Perceptual disturbances:   WNL  Orientation: oriented to person, place, and time/date  Attention: Good  Concentration: Good  Memory: WNL  Fund of knowledge:  Good  Insight:   Good  Judgment:  Good  Impulse Control: Good   Risk Assessment: Danger to Self:  No Self-injurious Behavior: No Danger to Others: No Duty to Warn:no Physical Aggression / Violence:No  Access to Firearms a concern: No  Gang Involvement:No   Subjective:   Mary [name] participated from home, via video, and consented to treatment. I discussed the limitations of evaluation and management by telemedicine and the availability of in person appointments. The patient expressed understanding and agreed to proceed.  Therapist participated from home office.  Mary [name] reviewed the events of the past week.   Mary [name] is Mary [name] and she is causing big uproars with patient and Mary [name].  Mary [name] is Mary [name] who bad mouthed Mary [name]. Patient is working on Midwife to decrease her anxiety.   Interventions: Cognitive Behavioral Therapy  Diagnosis: Major depressive disorder, recurrent episode, mild (HCC) [F33.0]  GAD (generalized anxiety disorder) [F41.1]   Psychiatric Treatment: Yes , via psychiatrist  Treatment Plan:  Client  Abilities/Strengths Mary [name] reports hopefulness, Self Advocacy, Ability to Communicate Effectively, and intelligence as her strengths.   Support System: Mary [name] significant other, and Mary [name] ex husband  Client Treatment Preferences OPT  Client Statement of Needs Mary [name] would like to learn techniques to decrease her anxiety, got back to work even if it's part-time, build her trust in Mary [name], cut back on smoking cigarettes, decrease her worrying, and learn how to focus more effectively.         Treatment Level Weekly  Symptoms  Mary [name] experiences symptoms of anxiety and depression.  Goals:  Target Date: 02/13/25 Frequency: Weekly  Progress: 0 Modality: individual    Therapist will provide referrals for additional resources as appropriate.  Therapist will provide psycho-education regarding Mary [name]'s diagnosis and corresponding treatment approaches and interventions. Mary [name] will support the patient's ability to achieve the goals identified. will employ CBT, BA, Problem-solving, Solution Focused, Mindfulness,  coping skills, & other evidenced-based practices will be used to promote progress towards healthy functioning to help manage decrease symptoms associated with their diagnosis.   Reduce overall level, frequency, and intensity of the feelings of depression, anxiety and panic evidenced by decreased overall symptoms from 6 to 7 days/week to 0 to 1 days/week per client report for at least 3 consecutive months. Verbally express understanding of the relationship between feelings of depression, anxiety and their impact on thinking patterns and behaviors. Verbalize an understanding of the role that distorted thinking plays in creating fears, excessive worry, and ruminations.  Mary [name] participated in the creation of the treatment plan)    Mary [name]

## 2024-07-23 NOTE — Therapy (Signed)
 OUTPATIENT PHYSICAL THERAPY CERVICAL EVALUATION   Patient Name: Mary [name] MRN: [id number] DOB:January 29, 1986, 38 y.o., female Today's Date: 07/23/2024  END OF SESSION:  PT End of Session - 07/23/24 1142     Visit Number 2    Number of Visits 12    Date for PT Re-Evaluation 08/25/24    PT Start Time 1100    PT Stop Time 1150    PT Time Calculation (min) 50 min          Past Medical History:  Diagnosis Date   Asthma    Atypical squamous cell changes of undetermined significance (ASCUS) on cervical cytology with positive high risk human papilloma virus (HPV) 07/08/2013   Overview:   ASCUS with positive high risk HPV 07/08/13  Mild to moderate dysplasia on colpo 02/10/14   Plan for repeat colpo in 6 months (Due 08/13/14) schedulers flagged.  ASCUS with positive high risk HPV 07/08/13  Mild to moderate dysplasia on colpo 02/10/14   Plan for repeat colpo in 6 months (Due 08/13/14) schedulers flagged.   Bronchitis 01/31/2016   Chiari malformation type I (HCC)    Depression    GERD (gastroesophageal reflux disease)    H/O multiple concussions    Headache(784.0)    History of palpitations    Hx of fracture of nose    Mental disorder    Multiple fractures    Hx: of a leg and arm fracture as a child   Pneumonia    PONV (postoperative nausea and vomiting)    Post traumatic stress disorder (PTSD)    Pregnant 01/21/2016   PTSD (post-traumatic stress disorder)    Sinus infection 01/31/2016   Supervision of normal pregnancy, antepartum 02/08/2016    [facility] at             13+1 week FOB   [name] Daily 38 yo WM Dating By  LMP and US  Pap  02/08/16 GC/CT Initial:                36+wks: Genetic Screen NT/IT:  CF screen  Anatomic US   Flu vaccine  Tdap Recommended ~ 28wks Glucose Screen  2 hr GBS  Feed Preference  Contraception  Circumcision  Childbirth Classes  Pediatrician     Past Surgical History:  Procedure Laterality Date   botox injections     colposcopy      DILATION AND CURETTAGE OF UTERUS     nexplanon     SUBOCCIPITAL CRANIECTOMY CERVICAL LAMINECTOMY N/A 12/16/2018   Procedure: Cervical one Laminectomy/Chiari decompression;  Surgeon: Onetha Kuba, MD;  Location: [facility] OR;  Service: Neurosurgery;  Laterality: N/A;   WISDOM TOOTH EXTRACTION     Patient Active Problem List   Diagnosis Date Noted   Major depressive disorder, recurrent episode, mild (HCC) 04/08/2024   GAD (generalized anxiety disorder) 01/16/2024   Vertigo 12/31/2023   Atypical chest pain 09/25/2022   Migraine with aura and without status migrainosus 07/24/2022   History of posttraumatic stress disorder (PTSD) 04/25/2022   Vitamin D  deficiency 09/11/2019   Hyperlipidemia 09/11/2019   Palpitations 07/30/2018   Chiari I malformation (HCC) 02/13/2018   Current smoker 04/20/2017   Asthma 06/04/2013   Depression 06/04/2013   REFERRING PROVIDER: Juliene Dunnings DO  REFERRING DIAG: Cervicalgia.  THERAPY DIAG:  Cervicalgia  Other muscle spasm  Rationale for Evaluation and Treatment: Rehabilitation  ONSET DATE: Ongoing.   SUBJECTIVE:  SUBJECTIVE STATEMENT: The patient reports doing fairly well today, but tight in both Utraps   PERTINENT HISTORY:  Please see above.  Chiari I malformation and subsequent surgery.    PAIN:  Are you having pain? Yes: NPRS scale: 4-4/10.   Pain location: Bilateral neck.   Pain description: Ache, throb and sharp. Aggravating factors: Stress. Relieving factors: As above.   PRECAUTIONS: Chiari I malformation and subsequent surgery.     RED FLAGS: None     WEIGHT BEARING RESTRICTIONS: No  FALLS:  Has patient fallen in last 6 months? No  LIVING ENVIRONMENT: Lives with: lives with their family Lives in: House/apartment Has following equipment  at home: None  OCCUPATION: Disabled.    PLOF: Independent  PATIENT GOALS: Reduce pain, less intense and frequent of headache.     OBJECTIVE:   PATIENT SURVEYS:  NDI:  28/50.  POSTURE: Forward head and rounded shoulder.  PALPATION:    Diffuse tenderness over bilateral cervical paraspinal musculature.  Bilateral UT's exhibits significant tone, left > right.    CERVICAL ROM:   Right active cervical rotation is 72 degrees and left is 65 degrees.  UPPER EXTREMITY ROM:  WNL.  UPPER EXTREMITY MMT:  Bilateral deltoid strength is 4 to 4+/5.  Normal bilateral elbow strength.   TREATMENT DATE:07-24-51 US  estim combo x 12 mins ( 6 mins to each Utrap) 1.5 w/cm2 STW/M x 12 minutes to bilateral UT's with ischemic release utilized.  IFC at 80-150 Hz on 40% scan x 15 minutes      Normal modality response following removal of modality                                                                                                                               PATIENT EDUCATION:  Education details:  Person educated:  International aid/development worker:  Education comprehension:   HOME EXERCISE PROGRAM:   ASSESSMENT:  CLINICAL IMPRESSION: The patient presents to [facility] with chronic neck pain and headaches. Rx focused on pain and mm tension reduction with US  combo, STW as well as IFC to Bil. Utraps with good results with reports of decreased pain.      OBJECTIVE IMPAIRMENTS: decreased activity tolerance, increased muscle spasms, postural dysfunction, and pain.   ACTIVITY LIMITATIONS: carrying, lifting, and sitting  PARTICIPATION LIMITATIONS: meal prep, cleaning, and laundry  PERSONAL FACTORS: Time since onset of injury/illness/exacerbation and 1 comorbidity: Chiari malformation and surgery are also affecting patient's functional outcome.   REHAB POTENTIAL: Good  CLINICAL DECISION MAKING: Stable/uncomplicated  EVALUATION COMPLEXITY: Low   GOALS:  SHORT TERM GOALS: Target date:  08/04/24  Ind with a HEP.  Goal status: INITIAL   LONG TERM GOALS: Target date: 09/01/24.  Improve NDI by at least 5 points.  Goal status: INITIAL  2.  Decrease headaches to no more than 1-2 per week.  Goal status: INITIAL  3.  Improve bilateral cervical rotation to 75-80 degrees so she can turn her head  more easily while driving.  Goal status: INITIAL  4.  Perform ADL's with pain not > 3-4/10.  Goal status: INITIAL   PLAN:  PT FREQUENCY: 2x/week  PT DURATION: 6 weeks  PLANNED INTERVENTIONS: 97110-Therapeutic exercises, 97530- Therapeutic activity, [id number]- Neuromuscular re-education, 97535- Self Care, 02859- Manual therapy, G0283- Electrical stimulation (unattended), 97035- Ultrasound, 79439 (1-2 muscles), 20561 (3+ muscles)- Dry Needling, Patient/Family education, Cryotherapy, and Moist heat  PLAN FOR NEXT SESSION: Combo e'stim/US , STW/M, postural exercises.    Ardell Makarewicz,CHRIS, PTA 07/23/2024, 11:51 AM

## 2024-07-28 ENCOUNTER — Ambulatory Visit: Admitting: Psychology

## 2024-07-29 ENCOUNTER — Ambulatory Visit

## 2024-07-29 ENCOUNTER — Ambulatory Visit: Admitting: Psychology

## 2024-07-29 DIAGNOSIS — F411 Generalized anxiety disorder: Secondary | ICD-10-CM | POA: Diagnosis not present

## 2024-07-29 DIAGNOSIS — F330 Major depressive disorder, recurrent, mild: Secondary | ICD-10-CM

## 2024-07-29 DIAGNOSIS — M542 Cervicalgia: Secondary | ICD-10-CM

## 2024-07-29 DIAGNOSIS — M62838 Other muscle spasm: Secondary | ICD-10-CM

## 2024-07-29 NOTE — Progress Notes (Signed)
 [facility] Counselor/Therapist Progress Note  Patient ID: Mary [name], MRN: [id number]    Date: 07/29/24  Time Spent: 5:00 pm - 5:57 pm : 57 minutes    Treatment Type: Individual Therapy.  Reported Symptoms:  Feeling nervous and on edge, not being able to stop worrying, trouble relaxing, being restless, feeling afraid as if something awful might happen, trouble with sleep, feeling like let self down, and trouble concentrating.    Mental Status Exam: Appearance:  Well Groomed     Behavior: Appropriate  Motor: Normal  Speech/Language:  Normal Rate  Affect: Appropriate  Mood: normal  Thought process: normal  Thought content:   WNL  Sensory/Perceptual disturbances:   WNL  Orientation: oriented to person, place, and time/date  Attention: Good  Concentration: Good  Memory: WNL  Fund of knowledge:  Good  Insight:   Good  Judgment:  Good  Impulse Control: Good   Risk Assessment: Danger to Self:  No Self-injurious Behavior: No Danger to Others: No Duty to Warn:no Physical Aggression / Violence:No  Access to Firearms a concern: No  Gang Involvement:No   Subjective:   Mary [name] participated from home, via video, and consented to treatment. I discussed the limitations of evaluation and management by telemedicine and the availability of in person appointments. The patient expressed understanding and agreed to proceed.  Therapist participated from home office.  Mary [name] reviewed the events of the past week.   Patient reported that she'd had more anxiety attacks recently, and we discussed techniques for her to decrease her anxiety.    Interventions: Cognitive Behavioral Therapy  Diagnosis:  Major depressive disorder, recurrent episode, mild (HCC) [F33.0]  GAD (generalized anxiety disorder) [F41.1]   Psychiatric Treatment: Yes , Via psychiatrist.   Treatment Plan:  Client Abilities/Strengths Mary [name] reports hopefulness, ability to communicate effectively, and  intelligence as her strengths.   Support System: Jacob, live in significant  Client Treatment Preferences OPT  Client Statement of Needs Mary [name] would like to learn techniques to decrease her anxiety, got back to work even if it's part-time, build her trust in [facility], cut back on smoking cigarettes, decrease her worrying, and learn how to focus more effectively.    Treatment Level Weekly  Symptoms  Shade experiences symptoms of anxiety and depression.   Goals:   Target Date: 02/13/25 Frequency: Weekly  Progress: 0 Modality: individual    Therapist will provide referrals for additional resources as appropriate.  Therapist will provide psycho-education regarding Mary [name]'s diagnosis and corresponding treatment approaches and interventions. Mary [name] will support the patient's ability to achieve the goals identified. will employ CBT, BA, Problem-solving, Solution Focused, Mindfulness,  coping skills, & other evidenced-based practices will be used to promote progress towards healthy functioning to help manage decrease symptoms associated with their diagnosis.   Reduce overall level, frequency, and intensity of the feelings of depression, anxiety and panic evidenced by decreased overall symptoms from 6 to 7 days/week to 0 to 1 days/week per client report for at least 3 consecutive months. Verbally express understanding of the relationship between feelings of depression and anxiety and their impact on thinking patterns and behaviors. Verbalize an understanding of the role that distorted thinking plays in creating fears, excessive worry, and ruminations.  Mary [name] participated in the creation of the treatment plan)    Mary [name]

## 2024-07-29 NOTE — Therapy (Signed)
 OUTPATIENT PHYSICAL THERAPY CERVICAL TREATMENT   Patient Name: Mary [name] MRN: [id number] DOB:11-28-1986, 38 y.o., female Today's Date: 07/29/2024  END OF SESSION:  PT End of Session - 07/29/24 1307     Visit Number 3    Number of Visits 12    Date for PT Re-Evaluation 08/25/24    PT Start Time 1300    PT Stop Time 1401    PT Time Calculation (min) 61 min          Past Medical History:  Diagnosis Date   Asthma    Atypical squamous cell changes of undetermined significance (ASCUS) on cervical cytology with positive high risk human papilloma virus (HPV) 07/08/2013   Overview:   ASCUS with positive high risk HPV 07/08/13  Mild to moderate dysplasia on colpo 02/10/14   Plan for repeat colpo in 6 months (Due 08/13/14) schedulers flagged.  ASCUS with positive high risk HPV 07/08/13  Mild to moderate dysplasia on colpo 02/10/14   Plan for repeat colpo in 6 months (Due 08/13/14) schedulers flagged.   Bronchitis 01/31/2016   Chiari malformation type I (HCC)    Depression    GERD (gastroesophageal reflux disease)    H/O multiple concussions    Headache(784.0)    History of palpitations    Hx of fracture of nose    Mental disorder    Multiple fractures    Hx: of a leg and arm fracture as a child   Pneumonia    PONV (postoperative nausea and vomiting)    Post traumatic stress disorder (PTSD)    Pregnant 01/21/2016   PTSD (post-traumatic stress disorder)    Sinus infection 01/31/2016   Supervision of normal pregnancy, antepartum 02/08/2016    [facility] at             13+1 week FOB   [name] Daily 38 yo WM Dating By  LMP and US  Pap  02/08/16 GC/CT Initial:                36+wks: Genetic Screen NT/IT:  CF screen  Anatomic US   Flu vaccine  Tdap Recommended ~ 28wks Glucose Screen  2 hr GBS  Feed Preference  Contraception  Circumcision  Childbirth Classes  Pediatrician     Past Surgical History:  Procedure Laterality Date   botox injections     colposcopy      DILATION AND CURETTAGE OF UTERUS     nexplanon     SUBOCCIPITAL CRANIECTOMY CERVICAL LAMINECTOMY N/A 12/16/2018   Procedure: Cervical one Laminectomy/Chiari decompression;  Surgeon: Onetha Kuba, MD;  Location: [facility] OR;  Service: Neurosurgery;  Laterality: N/A;   WISDOM TOOTH EXTRACTION     Patient Active Problem List   Diagnosis Date Noted   Major depressive disorder, recurrent episode, mild (HCC) 04/08/2024   GAD (generalized anxiety disorder) 01/16/2024   Vertigo 12/31/2023   Atypical chest pain 09/25/2022   Migraine with aura and without status migrainosus 07/24/2022   History of posttraumatic stress disorder (PTSD) 04/25/2022   Vitamin D  deficiency 09/11/2019   Hyperlipidemia 09/11/2019   Palpitations 07/30/2018   Chiari I malformation (HCC) 02/13/2018   Current smoker 04/20/2017   Asthma 06/04/2013   Depression 06/04/2013   REFERRING PROVIDER: Juliene Dunnings DO  REFERRING DIAG: Cervicalgia.  THERAPY DIAG:  Cervicalgia  Other muscle spasm  Rationale for Evaluation and Treatment: Rehabilitation  ONSET DATE: Ongoing.   SUBJECTIVE:  SUBJECTIVE STATEMENT: Pt reports 5/10 bil upper trap/neck pain.   PERTINENT HISTORY:  Please see above.  Chiari I malformation and subsequent surgery.    PAIN:  Are you having pain? Yes: NPRS scale: 5/10.   Pain location: Bilateral neck.   Pain description: Ache, throb and sharp. Aggravating factors: Stress. Relieving factors: As above.   PRECAUTIONS: Chiari I malformation and subsequent surgery.     RED FLAGS: None     WEIGHT BEARING RESTRICTIONS: No  FALLS:  Has patient fallen in last 6 months? No  LIVING ENVIRONMENT: Lives with: lives with their family Lives in: House/apartment Has following equipment at home: None  OCCUPATION:  Disabled.    PLOF: Independent  PATIENT GOALS: Reduce pain, less intense and frequent of headache.     OBJECTIVE:   PATIENT SURVEYS:  NDI:  28/50.  POSTURE: Forward head and rounded shoulder.  PALPATION:    Diffuse tenderness over bilateral cervical paraspinal musculature.  Bilateral UT's exhibits significant tone, left > right.    CERVICAL ROM:   Right active cervical rotation is 72 degrees and left is 65 degrees.  UPPER EXTREMITY ROM:  WNL.  UPPER EXTREMITY MMT:  Bilateral deltoid strength is 4 to 4+/5.  Normal bilateral elbow strength.   TREATMENT DATE:  07-30-51 UBE x 6 mins (backwards/forwards) US  estim combo x 12 mins ( 6 mins to each Utrap) 1.5 w/cm2 STW/M to bilateral UT's with ischemic release utilized.  IFC at 80-150 Hz on 40% scan x 15 minutes      Normal modality response following removal of modality                                                                                                                          PATIENT EDUCATION:  Education details:  Person educated:  International aid/development worker:  Education comprehension:   HOME EXERCISE PROGRAM:   ASSESSMENT:  CLINICAL IMPRESSION: Pt arrives for today's treatment session reporting 5/10 bil upper trap/neck pain.  Pt able to tolerate warm up on UBE without increased pain or discomfort.  STW/M performed to bil upper traps to decrease pain and tone.  Normal responses to all modalities performed today.  Pt reported 3/10 bil upper trap pain at completion of today's treatment session.   OBJECTIVE IMPAIRMENTS: decreased activity tolerance, increased muscle spasms, postural dysfunction, and pain.   ACTIVITY LIMITATIONS: carrying, lifting, and sitting  PARTICIPATION LIMITATIONS: meal prep, cleaning, and laundry  PERSONAL FACTORS: Time since onset of injury/illness/exacerbation and 1 comorbidity: Chiari malformation and surgery are also affecting patient's functional outcome.   REHAB POTENTIAL:  Good  CLINICAL DECISION MAKING: Stable/uncomplicated  EVALUATION COMPLEXITY: Low   GOALS:  SHORT TERM GOALS: Target date: 08/04/24  Ind with a HEP.  Goal status: INITIAL   LONG TERM GOALS: Target date: 09/01/24.  Improve NDI by at least 5 points.  Goal status: INITIAL  2.  Decrease headaches to no more than 1-2 per week.  Goal status: INITIAL  3.  Improve bilateral cervical rotation to 75-80 degrees so she can turn her head more easily while driving.  Goal status: INITIAL  4.  Perform ADL's with pain not > 3-4/10.  Goal status: INITIAL   PLAN:  PT FREQUENCY: 2x/week  PT DURATION: 6 weeks  PLANNED INTERVENTIONS: 97110-Therapeutic exercises, 97530- Therapeutic activity, [id number]- Neuromuscular re-education, 97535- Self Care, 02859- Manual therapy, G0283- Electrical stimulation (unattended), 97035- Ultrasound, 79439 (1-2 muscles), 20561 (3+ muscles)- Dry Needling, Patient/Family education, Cryotherapy, and Moist heat  PLAN FOR NEXT SESSION: Combo e'stim/US , STW/M, postural exercises.    Delon DELENA Gosling, PTA 07/29/2024, 2:07 PM

## 2024-07-31 ENCOUNTER — Encounter: Payer: Self-pay | Admitting: Internal Medicine

## 2024-08-01 ENCOUNTER — Encounter: Admitting: *Deleted

## 2024-08-04 ENCOUNTER — Ambulatory Visit (INDEPENDENT_AMBULATORY_CARE_PROVIDER_SITE_OTHER): Admitting: Psychology

## 2024-08-04 ENCOUNTER — Encounter: Admitting: *Deleted

## 2024-08-04 DIAGNOSIS — F411 Generalized anxiety disorder: Secondary | ICD-10-CM | POA: Diagnosis not present

## 2024-08-04 DIAGNOSIS — F330 Major depressive disorder, recurrent, mild: Secondary | ICD-10-CM | POA: Diagnosis not present

## 2024-08-04 NOTE — Progress Notes (Signed)
 [facility] Counselor/Therapist Progress Note  Patient ID: Mary [name], MRN: [id number]    Date: 08/04/24  Time Spent: 11:00 am - 11:38 am : 38 minutes     Treatment Type: Individual Therapy.  Reported Symptoms:  Feeling nervous and on edge, not being able to stop worrying, trouble relaxing, being restless, feeling afraid as if something awful might happen, trouble with sleep, feeling like let self down, and trouble concentrating.   Mental Status Exam: Appearance:  Well Groomed     Behavior: Appropriate  Motor: Normal  Speech/Language:  Normal Rate  Affect: Labile  Mood: anxious  Thought process: normal  Thought content:   WNL  Sensory/Perceptual disturbances:   WNL  Orientation: oriented to person, place, and time/date  Attention: Good  Concentration: Good  Memory: WNL  Fund of knowledge:  Good  Insight:   Fair  Judgment:  Fair  Impulse Control: Good   Risk Assessment: Danger to Self:  No Self-injurious Behavior: No Danger to Others: No Duty to Warn:no Physical Aggression / Violence:No  Access to Firearms a concern: No  Gang Involvement:No   Subjective:   Mary [name] participated from home, via video, and consented to treatment. I discussed the limitations of evaluation and management by telemedicine and the availability of in person appointments. The patient expressed understanding and agreed to proceed.  Therapist participated from home office.  Mary [name] reviewed the events of the past week.   Patient was concerned about her relationship with [name] and his current behavior. Discussed relationship dynamics and patient is receptive to increasing her stress management techniques.   Interventions: Cognitive Behavioral Therapy  Diagnosis: Major depressive disorder, recurrent episode, mild (HCC) [F33.0]  GAD (generalized anxiety disorder) [F41.1]   Psychiatric Treatment: Yes , psychiatrist  Treatment Plan:  Client Abilities/Strengths Mary [name] is  optimistic about the future and does her best to communicate effectively  Support System: [name], significant other who lives with patient  Client Treatment Preferences OPT  Client Statement of Needs Mary [name] would like to learn techniques to decrease her anxiety   Treatment Level Weekly  Symptoms  Mary [name] experiences symptoms of anxiety and depression.   Goals:   Target Date: 02/13/25 Frequency: Weekly  Progress: 0 Modality: individual    Therapist will provide referrals for additional resources as appropriate.  Therapist will provide psycho-education regarding Mary [name]'s diagnosis and corresponding treatment approaches and interventions. [name] will support the patient's ability to achieve the goals identified. will employ CBT, BA, Problem-solving, Solution Focused, Mindfulness,  coping skills, & other evidenced-based practices will be used to promote progress towards healthy functioning to help manage decrease symptoms associated with their diagnosis.   Reduce overall level, frequency, and intensity of the feelings of depression, anxiety and panic evidenced by decreased overall symptoms from 6 to 7 days/week to 0 to 1 days/week per client report for at least 3 consecutive months. Verbally express understanding of the relationship between feelings of depression and anxiety and their impact on thinking patterns and behaviors. Verbalize an understanding of the role that distorted thinking plays in creating fears, excessive worry, and ruminations.  [name] participated in the creation of the treatment plan)    [name]

## 2024-08-04 NOTE — Progress Notes (Unsigned)
 Cardiology Office Note    Date:  08/08/2024  ID:  Mary [name], DOB 1986/08/03, MRN [id number] PCP:  Theophilus Andrews, Tully GRADE, MD  Cardiologist:  Dub Huntsman, DO  Electrophysiologist:  None   Chief Complaint: Palpitations, atypical chest pain   History of Present Illness: .    Mary [name] is a 38 y.o. female with visit-pertinent history of palpitations and chest pain, Chiari malformation, PTSD and near syncope.  Patient previously followed with Dr. Raford from 20 19 through 2020.  Cardiac monitor at that time indicated predominant underlying rhythm was sinus rhythm with an average heart rate of 94 bpm, max heart rate 143 bpm, noted to have rare PACs.  Echocardiogram at that time indicated LVEF 60 to 65%, normal wall thickness, wall motion was normal.  Patient was last seen by Dr. Huntsman on 11/10/2022, patient continued experiencing palpitations however was not taking propranolol .  Today patient presents regarding chest discomfort and palpitations.  Patient reports that in the last year and a half she has had significant stressors, her mom was sadly diagnosed with cancer at the end of last year and passed away while in hospice care in January.  Patient reports that starting in September of last year she started having sharp chest pains that was felt to be likely costochondritis, initially treated with prednisone  with improvement in symptoms however returned a few months later.  It was felt by her PCP that her chest discomfort was related to increased stressors.  Patient reports various types of chest pain, not specifically associated with exertion, some chest pain is constantly present and changes with palpation, she describes another chest pain that is more sharp and can last a few minutes.  Patient also reports ongoing palpitations and intermittently feeling as though her heart is racing, she reports she is concerned that she has POTS given her increased dizziness, Chiari malformation.   Patient also reports that she was recently diagnosed with iron deficiency anemia. Patient reports history of dizziness, has been evaluated by vestibular therapy and not to be related to vertigo, she also does report history of vestibular migraines.  She reports that her dizziness will worsen when she lays down flat from standing but can also occur with sitting to standing.  She denies any presyncope or syncope. ROS: .   Today she denies lower extremity edema, melena, hematuria, hemoptysis, diaphoresis, presyncope, syncope, orthopnea, and PND.  All other systems are reviewed and otherwise negative. Studies Reviewed: SABRA   EKG:  EKG is ordered today, personally reviewed, demonstrating  EKG Interpretation Date/Time:  Friday August 08 2024 10:43:46 EDT Ventricular Rate:  97 PR Interval:  146 QRS Duration:  94 QT Interval:  340 QTC Calculation: 431 R Axis:   82  Text Interpretation: Normal sinus rhythm Normal ECG When compared with ECG of 24-Apr-2024 18:25, No significant change was found Confirmed by Tymon Nemetz [phone]) on 08/08/2024 5:23:02 PM   CV Studies: Cardiac studies reviewed are outlined and summarized above. Otherwise please see EMR for full report. Cardiac Studies & Procedures   ______________________________________________________________________________________________     ECHOCARDIOGRAM  ECHOCARDIOGRAM COMPLETE 08/29/2018  Narrative *[facility]* [address] [place], [place] [address] [phone]  ------------------------------------------------------------------- Transthoracic Echocardiography  Patient:    Mary [name], Mary [name] MR #:       [id number] Study Date: 08/29/2018 Gender:     F Age:        31 Height:     162.6 cm Weight:     78.5  kg BSA:        1.91 m^2 Pt. Status: Room:  ATTENDING    Maxie Herlene MARLA TISA Maxie Herlene MARLA REFERRING    Maxie Herlene K PERFORMING   [facility] [place] SONOGRAPHER  Aida Pizza,  RCS  cc:  ------------------------------------------------------------------- LV EF: 60% -   65%  ------------------------------------------------------------------- Indications:      Palpitations 785.1.  Near Syncope.  ------------------------------------------------------------------- History:   PMH:  Acquired from the patient and from the patient&'s chart.  PMH:  Chiari I malformation  Risk factors:  Current smoker.  ------------------------------------------------------------------- Study Conclusions  - Left ventricle: The cavity size was normal. Wall thickness was normal. Systolic function was normal. The estimated ejection fraction was in the range of 60% to 65%. Wall motion was normal; there were no regional wall motion abnormalities. Left ventricular diastolic function parameters were normal. - Aortic valve: Valve area (VTI): 1.98 cm^2. Valve area (Vmax): 1.83 cm^2. Valve area (Vmean): 1.9 cm^2. - Technically adequate study.  ------------------------------------------------------------------- Study data:  No prior study was available for comparison.  Study status:  Routine.  Procedure:  The patient reported no pain pre or post test. Transthoracic echocardiography. Image quality was adequate.  Study completion:  There were no complications. Transthoracic echocardiography.  M-mode, complete 2D, spectral Doppler, and color Doppler.  Birthdate:  Patient birthdate: 02/13/1986.  Age:  Patient is 38 yr old.  Sex:  Gender: female. BMI: 29.7 kg/m^2.  Blood pressure:     120/83  Patient status: Inpatient.  Study date:  Study date: 08/29/2018. Study time: 11:38 AM.  Location:  Echo laboratory.  -------------------------------------------------------------------  ------------------------------------------------------------------- Left ventricle:  The cavity size was normal. Wall thickness was normal. Systolic function was normal. The estimated ejection fraction was in the range  of 60% to 65%. Wall motion was normal; there were no regional wall motion abnormalities. Left ventricular diastolic function parameters were normal.  ------------------------------------------------------------------- Aortic valve:   Trileaflet; normal thickness leaflets.  Doppler: There was no stenosis.   There was no significant regurgitation. VTI ratio of LVOT to aortic valve: 0.78. Valve area (VTI): 1.98 cm^2. Indexed valve area (VTI): 1.04 cm^2/m^2. Peak velocity ratio of LVOT to aortic valve: 0.72. Valve area (Vmax): 1.83 cm^2. Indexed valve area (Vmax): 0.96 cm^2/m^2. Mean velocity ratio of LVOT to aortic valve: 0.75. Valve area (Vmean): 1.9 cm^2. Indexed valve area (Vmean): 0.99 cm^2/m^2.    Mean gradient (S): 4 mm Hg. Peak gradient (S): 7 mm Hg.  ------------------------------------------------------------------- Aorta:  Aortic root: The aortic root was normal in size.  ------------------------------------------------------------------- Mitral valve:   Normal thickness leaflets .  Doppler:   There was no evidence for stenosis.   There was no significant regurgitation. Peak gradient (D): 2 mm Hg.  ------------------------------------------------------------------- Left atrium:  The atrium was normal in size.  ------------------------------------------------------------------- Atrial septum:  No defect or patent foramen ovale was identified.  ------------------------------------------------------------------- Right ventricle:  The cavity size was normal. Systolic function was normal.  ------------------------------------------------------------------- Pulmonic valve:   Not well visualized.  Doppler:   There was no evidence for stenosis.   There was no significant regurgitation.  ------------------------------------------------------------------- Tricuspid valve:   Normal thickness leaflets.  Doppler:   There was no evidence for stenosis.   There was mild  regurgitation.  ------------------------------------------------------------------- Pulmonary artery:   Systolic pressure was within the normal range.  ------------------------------------------------------------------- Right atrium:  The atrium was normal in size.  ------------------------------------------------------------------- Pericardium:  There was no pericardial effusion.  ------------------------------------------------------------------- Systemic veins: Inferior  vena cava: The vessel was normal in size. The respirophasic diameter changes were in the normal range (>= 50%), consistent with normal central venous pressure.  ------------------------------------------------------------------- Measurements  Left ventricle                           Value           Reference LV ID, ED, PLAX chordal          (L)     39.4   mm       43 - 52 LV ID, ES, PLAX chordal                  26.8   mm       23 - 38 LV fx shortening, PLAX chordal           32     %        >=29 LV PW thickness, ED                      8.39   mm       --------- IVS/LV PW ratio, ED                      0.98            <=1.3  Ventricular septum                       Value           Reference IVS thickness, ED                        8.21   mm       ---------  LVOT                                     Value           Reference LVOT ID, S                               18     mm       --------- LVOT area                                2.54   cm^2     --------- LVOT peak velocity, S                    95.9   cm/s     --------- LVOT mean velocity, S                    64.4   cm/s     --------- LVOT VTI, S                              18.1   cm       --------- LVOT peak gradient, S                    4      mm Hg    ---------  Aortic valve  Value           Reference Aortic valve peak velocity, S            132.71 cm/s     --------- Aortic valve mean velocity, S            86.23  cm/s      --------- Aortic valve VTI, S                      23.07  cm       --------- Aortic mean gradient, S                  4      mm Hg    --------- Aortic peak gradient, S                  7      mm Hg    --------- VTI ratio, LVOT/AV                       0.78            --------- Aortic valve area, VTI                   1.98   cm^2     --------- Aortic valve area/bsa, VTI               1.04   cm^2/m^2 --------- Velocity ratio, peak, LVOT/AV            0.72            --------- Aortic valve area, peak velocity         1.83   cm^2     --------- Aortic valve area/bsa, peak              0.96   cm^2/m^2 --------- velocity Velocity ratio, mean, LVOT/AV            0.75            --------- Aortic valve area, mean velocity         1.9    cm^2     --------- Aortic valve area/bsa, mean              0.99   cm^2/m^2 --------- velocity  Aorta                                    Value           Reference Aortic root ID, ED                       30     mm       ---------  Left atrium                              Value           Reference LA ID, A-P, ES                           29     mm       --------- LA volume/bsa, S  18.2   ml/m^2   ---------  Mitral valve                             Value           Reference Mitral E-wave peak velocity              76.1   cm/s     --------- Mitral A-wave peak velocity              54.8   cm/s     --------- Mitral deceleration time         (H)     232    ms       150 - 230 Mitral peak gradient, D                  2      mm Hg    --------- Mitral E/A ratio, peak                   1.4             ---------  Pulmonary arteries                       Value           Reference PA pressure, S, DP                       19     mm Hg    <=30  Right ventricle                          Value           Reference TAPSE                                    18.4   mm       --------- RV s&', lateral, S                        10.8   cm/s      ---------  Legend: (L)  and  (H)  mark values outside specified reference range.  ------------------------------------------------------------------- Prepared and Electronically Authenticated by  Cassondra Ross, M.D. 2019-09-19T15:18:32    MONITORS  LONG TERM MONITOR (3-14 DAYS) 08/29/2018  Narrative 7 Day Zio Monitor  Quality: Fair.  Baseline artifact. Predominant rhythm: sinus rhythm Average heart rate: 94 bpm Max heart rate: 143 bpm  Rare PACs   Tiffany C. Raford, MD, [facility] 09/18/2018 8:41 AM       ______________________________________________________________________________________________       Current Reported Medications:.    Current Meds  Medication Sig   acetaZOLAMIDE  (DIAMOX ) 250 MG tablet Take 1 tablet (250 mg total) by mouth 2 (two) times daily.   albuterol  (VENTOLIN  HFA) 108 (90 Base) MCG/ACT inhaler Inhale 2 puffs into the lungs every 4 (four) hours as needed.   aspirin-acetaminophen -caffeine  (EXCEDRIN MIGRAINE) 250-250-65 MG tablet Take by mouth every 6 (six) hours as needed for headache.   azelastine  (ASTELIN ) 0.1 % nasal spray Place 1 spray into both nostrils 2 (two) times daily. Use in each nostril as directed   clonazePAM (KLONOPIN) 0.5 MG tablet Take 0.5 mg by mouth 2 (two) times daily.  EPINEPHrine  (EPIPEN  2-PAK) 0.3 mg/0.3 mL IJ SOAJ injection Inject 0.3 mg into the muscle as needed for anaphylaxis.   fluticasone  (FLONASE ) 50 MCG/ACT nasal spray Place 1 spray into both nostrils daily as needed for allergies or rhinitis.   ibuprofen  (ADVIL ) 800 MG tablet Take 800 mg by mouth every 8 (eight) hours as needed.   montelukast  (SINGULAIR ) 10 MG tablet Take 1 tablet (10 mg total) by mouth at bedtime.   ondansetron  (ZOFRAN -ODT) 4 MG disintegrating tablet Take 1 tablet (4 mg total) by mouth every 8 (eight) hours as needed.   Rimegepant Sulfate (NURTEC) 75 MG TBDP Take 1 tablet (75 mg total) by mouth daily as needed.   Vitamin D , Ergocalciferol ,  (DRISDOL ) 1.25 MG (50000 UNIT) CAPS capsule Take 1 capsule (50,000 Units total) by mouth every 7 (seven) days.    Physical Exam:    VS:  BP 116/78   Pulse 97   Ht 5' 4 (1.626 m)   Wt 178 lb (80.7 kg)   BMI 30.55 kg/m    Wt Readings from Last 3 Encounters:  08/08/24 178 lb (80.7 kg)  06/12/24 176 lb (79.8 kg)  02/18/24 169 lb (76.7 kg)    GEN: Well nourished, well developed in no acute distress NECK: No JVD; No carotid bruits CARDIAC: RRR, no murmurs, rubs, gallops RESPIRATORY:  Clear to auscultation without rales, wheezing or rhonchi  ABDOMEN: Soft, non-tender, non-distended EXTREMITIES:  No edema; No acute deformity     Asessement and Plan:.    Palpitations/dizziness: Patient reports intermittent episodes of increased palpitations described as her heart racing, notes that she got up this morning and felt as though her heart rate was flying, improved with relaxing and taking of deep calming breaths.  Patient reports that she knows she has problems with anxiety, has been following with a psychiatrist, she feels that her anxiety has improved however her palpitations persist.  Patient reports concern for POTS however on checking orthostatic vital signs today her blood pressure was stable as was her heart rate with no significant changes, patient did endorse increased dizziness from lying flat to sitting.  Patient notes significant history of dizziness, notes that she does have a history of vestibular migraines, has been evaluated by vestibular therapy and noted to not have vertigo.  Patient does have history of Chiari malformation and had decompression in 2019, also had a tumor removed at the time.  She plans to also follow-up with her neurosurgeon regarding ongoing dizziness however she does question if these are related to her increased palpitations.  Will have patient wear 2 weeks ZIO monitor for monitoring of palpitations and check echocardiogram.  Check CBC, bmet, TSH and  magnesium.  Atypical chest pain: On chart review patient has had history of atypical chest pain associated with palpitations while pregnant, patient notes that her palpitations were significantly worse when she was pregnant with her last child.  Patient reports that last year her mother was diagnosed with cancer, around that time she started having significantly worsening chest pain that a previous provider felt was likely costochondritis, improved with use of prednisone  however following restarted.  Per patient was felt by her PCP that it was related to increased anxiety, patient describes a few different kinds of chest pain, some as a constant achy pain and some as an intermittent sharp pain, not associated with exertion.  She does feel that her palpitations worsen and can cause increased chest pain.  Will evaluate with cardiac monitor and echocardiogram as noted above.  If unrevealing could consider coronary CTA if heart rate is better controlled on beta-blocker.  Chiari malformation: s/p decompression in 2019.  Patient notes increased dizziness and position changes, has been followed by neurology however notes she is considering following back up with her neurosurgeon.  Tobacco use: Patient reports that a few weeks ago she started smoking again, will typically smoke 1 pack over a 4-day period.  Complete cessation encouraged.    Disposition: F/u with Marykathryn Carboni, NP in 8 weeks.   Signed, Rynn Markiewicz D Daliah Chaudoin, NP

## 2024-08-08 ENCOUNTER — Encounter: Payer: Self-pay | Admitting: Cardiology

## 2024-08-08 ENCOUNTER — Ambulatory Visit: Attending: Cardiology | Admitting: Cardiology

## 2024-08-08 ENCOUNTER — Encounter

## 2024-08-08 ENCOUNTER — Ambulatory Visit: Attending: Cardiology

## 2024-08-08 VITALS — BP 116/78 | HR 97 | Ht 64.0 in | Wt 178.0 lb

## 2024-08-08 DIAGNOSIS — R42 Dizziness and giddiness: Secondary | ICD-10-CM | POA: Diagnosis not present

## 2024-08-08 DIAGNOSIS — G935 Compression of brain: Secondary | ICD-10-CM

## 2024-08-08 DIAGNOSIS — R002 Palpitations: Secondary | ICD-10-CM | POA: Diagnosis not present

## 2024-08-08 DIAGNOSIS — R0789 Other chest pain: Secondary | ICD-10-CM

## 2024-08-08 NOTE — Progress Notes (Unsigned)
 Enrolled for [employer] to mail a ZIO XT long term holter monitor to the patients address on file.   Dr. Harriet Masson to read.

## 2024-08-08 NOTE — Patient Instructions (Signed)
 Medication Instructions:  No changes *If you need a refill on your cardiac medications before your next appointment, please call your pharmacy*  Lab Work: Today we are going to draw a CBC, Bmet, TSH, and Mag level If you have labs (blood work) drawn today and your tests are completely normal, you will receive your results only by: MyChart Message (if you have MyChart) OR A paper copy in the mail If you have any lab test that is abnormal or we need to change your treatment, we will call you to review the results.  Your physician has requested that you have an echocardiogram. Echocardiography is a painless test that uses sound waves to create images of your heart. It provides your doctor with information about the size and shape of your heart and how well your heart's chambers and valves are working. This procedure takes approximately one hour. There are no restrictions for this procedure. Please do NOT wear cologne, perfume, aftershave, or lotions (deodorant is allowed). Please arrive 15 minutes prior to your appointment time.  Please note: We ask at that you not bring children with you during ultrasound (echo/ vascular) testing. Due to room size and safety concerns, children are not allowed in the ultrasound rooms during exams. Our front office staff cannot provide observation of children in our lobby area while testing is being conducted. An adult accompanying a patient to their appointment will only be allowed in the ultrasound room at the discretion of the ultrasound technician under special circumstances. We apologize for any inconvenience.   Follow-Up: At [facility], you and your health needs are our priority.  As part of our continuing mission to provide you with exceptional heart care, our providers are all part of one team.  This team includes your primary Cardiologist (physician) and Advanced Practice Providers or APPs (Physician Assistants and Nurse Practitioners) who all work  together to provide you with the care you need, when you need it.  Your next appointment:   8 week(s)  Provider:   Katlyn West, NP  We recommend signing up for the patient portal called MyChart.  Sign up information is provided on this After Visit Summary.  MyChart is used to connect with patients for Virtual Visits (Telemedicine).  Patients are able to view lab/test results, encounter notes, upcoming appointments, etc.  Non-urgent messages can be sent to your provider as well.   To learn more about what you can do with MyChart, go to [email or web address].   Other Instructions ZIO XT- Long Term Monitor Instructions  Your physician has requested you wear a ZIO patch monitor for 14 days.  This is a single patch monitor. [employer] supplies one patch monitor per enrollment. Additional stickers are not available. Please do not apply patch if you will be having a Nuclear Stress Test,  Echocardiogram, Cardiac CT, MRI, or Chest Xray during the period you would be wearing the  monitor. The patch cannot be worn during these tests. You cannot remove and re-apply the  ZIO XT patch monitor.  Your ZIO patch monitor will be mailed 3 day USPS to your address on file. It may take 3-5 days  to receive your monitor after you have been enrolled.  Once you have received your monitor, please review the enclosed instructions. Your monitor  has already been registered assigning a specific monitor serial # to you.  Billing and Patient Assistance Program Information  We have supplied [employer] with any of your insurance information on file for billing purposes. [employer]  offers a sliding scale Patient Assistance Program for patients that do not have  insurance, or whose insurance does not completely cover the cost of the ZIO monitor.  You must apply for the Patient Assistance Program to qualify for this discounted rate.  To apply, please call [employer] at [phone], select option 4, select option 2, ask to  apply for  Patient Assistance Program. Meredeth will ask your household income, and how many people  are in your household. They will quote your out-of-pocket cost based on that information.  [employer] will also be able to set up a 5-month, interest-free payment plan if needed.  Applying the monitor   Shave hair from upper left chest.  Hold abrader disc by orange tab. Rub abrader in 40 strokes over the upper left chest as  indicated in your monitor instructions.  Clean area with 4 enclosed alcohol pads. Let dry.  Apply patch as indicated in monitor instructions. Patch will be placed under collarbone on left  side of chest with arrow pointing upward.  Rub patch adhesive wings for 2 minutes. Remove white label marked 1. Remove the white  label marked 2. Rub patch adhesive wings for 2 additional minutes.  While looking in a mirror, press and release button in center of patch. A small green light will  flash 3-4 times. This will be your only indicator that the monitor has been turned on.  Do not shower for the first 24 hours. You may shower after the first 24 hours.  Press the button if you feel a symptom. You will hear a small click. Record Date, Time and  Symptom in the Patient Logbook.  When you are ready to remove the patch, follow instructions on the last 2 pages of Patient  Logbook. Stick patch monitor onto the last page of Patient Logbook.  Place Patient Logbook in the blue and white box. Use locking tab on box and tape box closed  securely. The blue and white box has prepaid postage on it. Please place it in the mailbox as  soon as possible. Your physician should have your test results approximately 7 days after the  monitor has been mailed back to [facility].  Call [facility] Customer Care at [phone] if you have questions regarding  your ZIO XT patch monitor. Call them immediately if you see an orange light blinking on your  monitor.  If your monitor falls off in  less than 4 days, contact our Monitor department at [phone].  If your monitor becomes loose or falls off after 4 days call [employer] at [phone] for  suggestions on securing your monitor

## 2024-08-12 ENCOUNTER — Ambulatory Visit: Admitting: Psychology

## 2024-08-13 ENCOUNTER — Ambulatory Visit: Attending: Neurology | Admitting: Physical Therapy

## 2024-08-13 ENCOUNTER — Ambulatory Visit: Admitting: Internal Medicine

## 2024-08-13 ENCOUNTER — Encounter: Payer: Self-pay | Admitting: Physical Therapy

## 2024-08-13 DIAGNOSIS — M542 Cervicalgia: Secondary | ICD-10-CM | POA: Diagnosis not present

## 2024-08-13 DIAGNOSIS — M62838 Other muscle spasm: Secondary | ICD-10-CM | POA: Insufficient documentation

## 2024-08-13 NOTE — Therapy (Signed)
 OUTPATIENT PHYSICAL THERAPY CERVICAL TREATMENT   Patient Name: Mary [name] MRN: [id number] DOB:June 09, 1986, 38 y.o., female Today's Date: 08/13/2024  END OF SESSION:  PT End of Session - 08/13/24 1342     Visit Number 4    Number of Visits 12    Date for PT Re-Evaluation 08/25/24    PT Start Time 0107    PT Stop Time 0150    PT Time Calculation (min) 43 min    Activity Tolerance Patient tolerated treatment well    Behavior During Therapy [facility] for tasks assessed/performed          Past Medical History:  Diagnosis Date   Asthma    Atypical squamous cell changes of undetermined significance (ASCUS) on cervical cytology with positive high risk human papilloma virus (HPV) 07/08/2013   Overview:   ASCUS with positive high risk HPV 07/08/13  Mild to moderate dysplasia on colpo 02/10/14   Plan for repeat colpo in 6 months (Due 08/13/14) schedulers flagged.  ASCUS with positive high risk HPV 07/08/13  Mild to moderate dysplasia on colpo 02/10/14   Plan for repeat colpo in 6 months (Due 08/13/14) schedulers flagged.   Bronchitis 01/31/2016   Chiari malformation type I (HCC)    Depression    GERD (gastroesophageal reflux disease)    H/O multiple concussions    Headache(784.0)    History of palpitations    Hx of fracture of nose    Mental disorder    Multiple fractures    Hx: of a leg and arm fracture as a child   Pneumonia    PONV (postoperative nausea and vomiting)    Post traumatic stress disorder (PTSD)    Pregnant 01/21/2016   PTSD (post-traumatic stress disorder)    Sinus infection 01/31/2016   Supervision of normal pregnancy, antepartum 02/08/2016    [facility]             13+1 week FOB   [name] Daily 39 yo WM Dating By  LMP and US  Pap  02/08/16 GC/CT Initial:                36+wks: Genetic Screen NT/IT:  CF screen  Anatomic US   Flu vaccine  Tdap Recommended ~ 28wks Glucose Screen  2 hr GBS  Feed Preference  Contraception  Circumcision  Childbirth Classes   Pediatrician     Past Surgical History:  Procedure Laterality Date   botox injections     colposcopy     DILATION AND CURETTAGE OF UTERUS     nexplanon     SUBOCCIPITAL CRANIECTOMY CERVICAL LAMINECTOMY N/A 12/16/2018   Procedure: Cervical one Laminectomy/Chiari decompression;  Surgeon: Onetha Kuba, MD;  Location: [facility] OR;  Service: Neurosurgery;  Laterality: N/A;   WISDOM TOOTH EXTRACTION     Patient Active Problem List   Diagnosis Date Noted   Major depressive disorder, recurrent episode, mild (HCC) 04/08/2024   GAD (generalized anxiety disorder) 01/16/2024   Vertigo 12/31/2023   Atypical chest pain 09/25/2022   Migraine with aura and without status migrainosus 07/24/2022   History of posttraumatic stress disorder (PTSD) 04/25/2022   Vitamin D  deficiency 09/11/2019   Hyperlipidemia 09/11/2019   Palpitations 07/30/2018   Chiari I malformation (HCC) 02/13/2018   Current smoker 04/20/2017   Asthma 06/04/2013   Depression 06/04/2013   REFERRING PROVIDER: Juliene Dunnings DO  REFERRING DIAG: Cervicalgia.  THERAPY DIAG:  Cervicalgia  Other muscle spasm  Rationale for Evaluation and Treatment: Rehabilitation  ONSET DATE:  Ongoing.   SUBJECTIVE:                                                                                                                                                                                                         SUBJECTIVE STATEMENT: Pain about a 5-6/10.  PERTINENT HISTORY:  Please see above.  Chiari I malformation and subsequent surgery.    PAIN:  Are you having pain? Yes: NPRS scale: 5-6/10.   Pain location: Bilateral neck.   Pain description: Ache, throb and sharp. Aggravating factors: Stress. Relieving factors: As above.   PRECAUTIONS: Chiari I malformation and subsequent surgery.     RED FLAGS: None     WEIGHT BEARING RESTRICTIONS: No  FALLS:  Has patient fallen in last 6 months? No  LIVING ENVIRONMENT: Lives with: lives with their  family Lives in: House/apartment Has following equipment at home: None  OCCUPATION: Disabled.    PLOF: Independent  PATIENT GOALS: Reduce pain, less intense and frequent of headache.     OBJECTIVE:   PATIENT SURVEYS:  NDI:  28/50.  POSTURE: Forward head and rounded shoulder.  PALPATION:    Diffuse tenderness over bilateral cervical paraspinal musculature.  Bilateral UT's exhibits significant tone, left > right.    CERVICAL ROM:   Right active cervical rotation is 72 degrees and left is 65 degrees.  UPPER EXTREMITY ROM:  WNL.  UPPER EXTREMITY MMT:  Bilateral deltoid strength is 4 to 4+/5.  Normal bilateral elbow strength.   TREATMENT DATE:  08/13/24:  Low-level combo e'stim/US  at 1.50 W/CM2 x 12 minutes to bilateral UT's f/b STW/M x 12 minutes f/b low-level IFC at 80-150 Hz on 40% scan x 10 minutes (patient requested shortened time)  07-30-51 UBE x 6 mins (backwards/forwards) US  estim combo x 12 mins ( 6 mins to each Utrap) 1.5 w/cm2 STW/M to bilateral UT's with ischemic release utilized.  IFC at 80-150 Hz on 40% scan x 15 minutes      Normal modality response following removal of modality  PATIENT EDUCATION:  Education details:  Person educated:  International aid/development worker:  Education comprehension:   HOME EXERCISE PROGRAM:   ASSESSMENT:  CLINICAL IMPRESSION: Patient did very well with treatment today.  She continues to exhibit increased tone over bilateral UT's.  OBJECTIVE IMPAIRMENTS: decreased activity tolerance, increased muscle spasms, postural dysfunction, and pain.   ACTIVITY LIMITATIONS: carrying, lifting, and sitting  PARTICIPATION LIMITATIONS: meal prep, cleaning, and laundry  PERSONAL FACTORS: Time since onset of injury/illness/exacerbation and 1 comorbidity: Chiari malformation and surgery are also affecting patient's functional  outcome.   REHAB POTENTIAL: Good  CLINICAL DECISION MAKING: Stable/uncomplicated  EVALUATION COMPLEXITY: Low   GOALS:  SHORT TERM GOALS: Target date: 08/04/24  Ind with a HEP.  Goal status: INITIAL   LONG TERM GOALS: Target date: 09/01/24.  Improve NDI by at least 5 points.  Goal status: INITIAL  2.  Decrease headaches to no more than 1-2 per week.  Goal status: INITIAL  3.  Improve bilateral cervical rotation to 75-80 degrees so she can turn her head more easily while driving.  Goal status: INITIAL  4.  Perform ADL's with pain not > 3-4/10.  Goal status: INITIAL   PLAN:  PT FREQUENCY: 2x/week  PT DURATION: 6 weeks  PLANNED INTERVENTIONS: 97110-Therapeutic exercises, 97530- Therapeutic activity, [id number]- Neuromuscular re-education, 97535- Self Care, 02859- Manual therapy, G0283- Electrical stimulation (unattended), 97035- Ultrasound, 79439 (1-2 muscles), 20561 (3+ muscles)- Dry Needling, Patient/Family education, Cryotherapy, and Moist heat  PLAN FOR NEXT SESSION: Combo e'stim/US , STW/M, postural exercises.    Corrado Hymon, [place], PT 08/13/2024, 1:53 PM

## 2024-08-19 ENCOUNTER — Telehealth: Payer: Self-pay

## 2024-08-19 ENCOUNTER — Ambulatory Visit (INDEPENDENT_AMBULATORY_CARE_PROVIDER_SITE_OTHER): Admitting: Psychology

## 2024-08-19 DIAGNOSIS — F411 Generalized anxiety disorder: Secondary | ICD-10-CM

## 2024-08-19 DIAGNOSIS — F330 Major depressive disorder, recurrent, mild: Secondary | ICD-10-CM

## 2024-08-19 NOTE — Progress Notes (Unsigned)
 [facility] Counselor/Therapist Progress Note  Patient ID: Mary [name], MRN: [id number]    Date: 08/19/24  Time Spent: 1:00 pm - 1:53 pm - 53 minutes    Treatment Type: Individual Therapy.  Reported Symptoms:  Feeling nervous and on edge, not being able to stop worrying, trouble relaxing, being restless, feeling afraid as if something awful might happen, trouble with sleep, feeling like let self down, and trouble concentrating.   Mental Status Exam: Appearance:  Casual     Behavior: Appropriate  Motor: Normal  Speech/Language:  Normal Rate  Affect: Appropriate  Mood: normal  Thought process: normal  Thought content:   WNL  Sensory/Perceptual disturbances:   WNL  Orientation: oriented to person, place, and time/date  Attention: Good  Concentration: Good  Memory: WNL  Fund of knowledge:  Good  Insight:   Good  Judgment:  Good  Impulse Control: Good   Risk Assessment: Danger to Self:  No Self-injurious Behavior: No Danger to Others: No Duty to Warn:no Physical Aggression / Violence:No  Access to Firearms a concern: No  Gang Involvement:No   Subjective:   Mary [name] participated from home, via video, and consented to treatment. I discussed the limitations of evaluation and management by telemedicine and the availability of in person appointments. The patient expressed understanding and agreed to proceed.  Therapist participated from home office.  Mary [name] reviewed the events of the past week.   Patient was drinking gatorade because of a lot of strange symptoms. Patient is questioning whether the symptoms she is having are physical or emotional. Patient feels as though she is getting mixed messages from her medical doctors and her non-medical doctors. Patient has been working on being able to decrease her worrying.   Interventions: Cognitive Behavioral Therapy  Diagnosis:  Major depressive disorder, recurrent episode, mild (HCC) [F33.0]  GAD (generalized  anxiety disorder) [F41.1]  Psychiatric Treatment: Yes , via psychiatrist  Treatment Plan:  Client Abilities/Strengths Mary [name] is optimistic about the future and does her best to communicate effectively.   Support System: Mary [name], significant other who lives with patient.  Client Treatment Preferences OPT  Client Statement of Needs Mary [name] would like to learn techniques to decrease her anxiety.   Treatment Level Weekly  Symptoms  Mary [name] experiences symptoms of anxiety and depression.   Goals:   Target Date: 02/13/25 Frequency: Weekly  Progress: 25% Modality: individual    Therapist will provide referrals for additional resources as appropriate.  Therapist will provide psycho-education regarding Mary [name]'s diagnosis and corresponding treatment approaches and interventions. Mary [name] will support the patient's ability to achieve the goals identified. will employ CBT, BA, Problem-solving, Solution Focused, Mindfulness,  coping skills, & other evidenced-based practices will be used to promote progress towards healthy functioning to help manage decrease symptoms associated with their diagnosis.   Reduce overall level, frequency, and intensity of the feelings of depression, anxiety and panic evidenced by decreased overall symptoms from 6 to 7 days/week to 0 to 1 days/week per client report for at least 3 consecutive months. Verbally express understanding of the relationship between feelings of depression and anxiety and their impact on thinking patterns and behaviors. Verbalize an understanding of the role that distorted thinking plays in creating fears, excessive worry, and ruminations.  [name] participated in the creation of the treatment plan)    Mary [name]

## 2024-08-19 NOTE — Telephone Encounter (Signed)
   Pre-operative Risk Assessment    Patient Name: Mary [name]  DOB: September 22, 1986 MRN: [id number]   Date of last office visit: 08/08/24 ROSABEL MOSE, NP Date of next office visit: 10/08/24 ROSABEL MOSE, NP  Request for Surgical Clearance    Procedure:  (DEEP CLEANING) SCALING AND ROOT PLANING IN ALL FOUR QUADRANTS OF THE MOUTH  Date of Surgery:  Clearance TBD                                Surgeon:  NOT INDICATED Surgeon's Group or Practice Name:  [facility] Phone number:  [phone] Fax number:  [phone]   Type of Clearance Requested:   - Medical    Type of Anesthesia:  Local    Additional requests/questions:    SignedLucie DELENA Ku   08/19/2024, 5:36 PM

## 2024-08-21 ENCOUNTER — Encounter: Payer: Self-pay | Admitting: Neurology

## 2024-08-22 NOTE — Telephone Encounter (Signed)
 Dentist office called in stating the following: Pt is coming in for procedure on Monday and the office needs a c/b by today (9/12) at 2 pm as to whether or not pt is cleared for surgery   Will route to the Preop Pool for the Preop APP to review.

## 2024-08-22 NOTE — Telephone Encounter (Signed)
 Pt is coming in for procedure on Monday and the office needs a c/b by today (9/12) at 2 pm as to whether or not pt is cleared for surgery. Please advise.

## 2024-08-22 NOTE — Telephone Encounter (Signed)
   Name:  Mary [name]  DOB:  1986/09/02  MRN:  [id number]   Primary Cardiologist: Kardie Tobb, DO  Chart reviewed as part of pre-operative protocol coverage. Patient was contacted 08/22/2024 in reference to pre-operative risk assessment for pending surgery as outlined below.  Mary [name] was last seen on 08/08/2024 by Mary West NP.  Since that day, Mary [name] has had recurrent chest pain and palpitations. She has two cardiac evaluation tests planned, and echocardiogram (09/02/2024) and a cardiac monitor (currently in progress). She cannot have procedure until these are completed.   Due to new or worsening symptoms, Mary [name] will require a follow-up visit for further pre-operative risk assessment. She is scheduled for 10/08/2024.   Pre-op covering staff: - Please schedule appointment and call patient to inform them. If patient already had an upcoming appointment within acceptable timeframe, please add pre-op clearance to the appointment notes so provider is aware. - Please contact requesting surgeon's office via preferred method (i.e, phone, fax) to inform them of need for appointment prior to surgery.  Mary Satterfield, NP 08/22/2024, 10:43 AM

## 2024-08-25 ENCOUNTER — Encounter: Admitting: Psychology

## 2024-08-25 NOTE — Progress Notes (Deleted)
                Mary [name]

## 2024-08-25 NOTE — Progress Notes (Deleted)
 [facility] Counselor/Therapist Progress Note  Patient ID: Mary [name], MRN: [id number]    Date: 09/04/24  Time Spent: ***  {onmampm:26702} - *** {onmampm:26702} : *** Minutes  Treatment Type: Individual Therapy.  Reported Symptoms: ***  Mental Status Exam: Appearance:  {PSY:22683}     Behavior: {PSY:21022743}  Motor: {PSY:22302}  Speech/Language:  {PSY:22685}  Affect: {PSY:22687}  Mood: {PSY:31886}  Thought process: {PSY:31888}  Thought content:   {PSY:[phone]}  Sensory/Perceptual disturbances:   {[phone]}  Orientation: {PSY:30297}  Attention: {PSY:22877}  Concentration: {PSY:262 618 3729}  Memory: {PSY:([phone]}  Fund of knowledge:  {PSY:262 618 3729}  Insight:   {PSY:262 618 3729}  Judgment:  {PSY:262 618 3729}  Impulse Control: {PSY:262 618 3729}   Risk Assessment: Danger to Self:  {PSY:22692} Self-injurious Behavior: {PSY:22692} Danger to Others: {PSY:22692} Duty to Warn:{PSY:311194} Physical Aggression / Violence:{PSY:21197} Access to Firearms a concern: {PSY:21197} Gang Involvement:{PSY:21197}  Subjective:   Mary [name] participated from {Patient Location:[address]::home}, via {ONMVIDEOORPHONE:26699}, and consented to treatment. I discussed the limitations of evaluation and management by telemedicine and the availability of in person appointments. The patient expressed understanding and agreed to proceed.  Therapist participated from {onmtherapistlocation:[address]}.  Mary [name] reviewed the events of the past week.   ***   Interventions: {PSY:[phone]}  Diagnosis:  Erroneous encounter - disregard  Psychiatric Treatment: {YES/NO:21197}, ***  Treatment Plan:  Client Abilities/Strengths Mary ***  Support System: ***  Client Treatment Preferences ***  Client Statement of Needs Mary [name] would like to ***   Treatment Level {Frequency of sessions.:26745}  Symptoms  ***   (Status: {Symptom Status:26744}) ***   (Status: {Symptom  Status:26744})  Goals:   Mary experiences symptoms of ***   Target Date: *** Frequency: {Frequency of sessions.:26745}  Progress: 0 Modality: individual    Therapist will provide referrals for additional resources as appropriate.  Therapist will provide psycho-education regarding Mary [name]'s diagnosis and corresponding treatment approaches and interventions. [name] will support the patient's ability to achieve the goals identified. will employ CBT, BA, Problem-solving, Solution Focused, Mindfulness,  coping skills, & other evidenced-based practices will be used to promote progress towards healthy functioning to help manage decrease symptoms associated with {his/her/their:21314} diagnosis.   Reduce overall level, frequency, and intensity of the feelings of depression, anxiety and panic evidenced by decreased *** from 6 to 7 days/week to 0 to 1 days/week per client report for at least 3 consecutive months. Verbally express understanding of the relationship between feelings of ***depression, ***anxiety and their impact on thinking patterns and behaviors. Verbalize an understanding of the role that distorted thinking plays in creating fears, excessive worry, and ruminations.  Mary [name] participated in the creation of the treatment plan)   [name] [name]

## 2024-08-25 NOTE — Progress Notes (Deleted)
 This encounter was created in error - please disregard.

## 2024-08-26 ENCOUNTER — Ambulatory Visit: Admitting: Psychology

## 2024-08-26 DIAGNOSIS — R0789 Other chest pain: Secondary | ICD-10-CM | POA: Diagnosis not present

## 2024-08-26 DIAGNOSIS — R002 Palpitations: Secondary | ICD-10-CM | POA: Diagnosis not present

## 2024-08-27 ENCOUNTER — Ambulatory Visit: Payer: Self-pay | Admitting: Cardiology

## 2024-08-27 LAB — BASIC METABOLIC PANEL WITH GFR
BUN/Creatinine Ratio: 9 (ref 9–23)
BUN: 7 mg/dL (ref 6–20)
CO2: 21 mmol/L (ref 20–29)
Calcium: 9.1 mg/dL (ref 8.7–10.2)
Chloride: 104 mmol/L (ref 96–106)
Creatinine, Ser: 0.79 mg/dL (ref 0.57–1.00)
Glucose: 104 mg/dL — AB (ref 70–99)
Potassium: 3.7 mmol/L (ref 3.5–5.2)
Sodium: 139 mmol/L (ref 134–144)
eGFR: 99 mL/min/1.73 (ref >59)

## 2024-08-27 LAB — CBC
Hematocrit: 38.7 % (ref 34.0–46.6)
Hemoglobin: 12.5 g/dL (ref 11.1–15.9)
MCH: 29 pg (ref 26.6–33.0)
MCHC: 32.3 g/dL (ref 31.5–35.7)
MCV: 90 fL (ref 79–97)
Platelets: 237 x10E3/uL (ref 150–450)
RBC: 4.31 x10E6/uL (ref 3.77–5.28)
RDW: 16.4 % — ABNORMAL HIGH (ref 11.7–15.4)
WBC: 7.3 x10E3/uL (ref 3.4–10.8)

## 2024-08-27 LAB — MAGNESIUM: Magnesium: 2.3 mg/dL (ref 1.6–2.3)

## 2024-08-27 LAB — TSH: TSH: 1.51 u[IU]/mL (ref 0.450–4.500)

## 2024-08-29 ENCOUNTER — Other Ambulatory Visit (HOSPITAL_COMMUNITY)

## 2024-08-29 ENCOUNTER — Ambulatory Visit

## 2024-09-02 ENCOUNTER — Ambulatory Visit (HOSPITAL_COMMUNITY)
Admission: RE | Admit: 2024-09-02 | Discharge: 2024-09-02 | Disposition: A | Discharge status: Home / Self Care | Source: Ambulatory Visit | Attending: Cardiology | Admitting: Cardiology

## 2024-09-02 DIAGNOSIS — R0789 Other chest pain: Secondary | ICD-10-CM | POA: Insufficient documentation

## 2024-09-02 DIAGNOSIS — R42 Dizziness and giddiness: Secondary | ICD-10-CM | POA: Diagnosis not present

## 2024-09-02 DIAGNOSIS — R002 Palpitations: Secondary | ICD-10-CM | POA: Insufficient documentation

## 2024-09-02 LAB — ECHOCARDIOGRAM COMPLETE
Area-P 1/2: 4.15 cm2
Calc EF: 54.1 %
S' Lateral: 3.3 cm
Single Plane A2C EF: 57.4 %
Single Plane A4C EF: 51.9 %

## 2024-09-02 NOTE — Progress Notes (Signed)
*  PRELIMINARY RESULTS* Echocardiogram 2D Echocardiogram has been performed.  Mary [name] 09/02/2024, 11:41 AM

## 2024-09-03 ENCOUNTER — Ambulatory Visit: Admitting: Psychology

## 2024-09-03 DIAGNOSIS — F330 Major depressive disorder, recurrent, mild: Secondary | ICD-10-CM

## 2024-09-03 DIAGNOSIS — F411 Generalized anxiety disorder: Secondary | ICD-10-CM | POA: Diagnosis not present

## 2024-09-03 NOTE — Progress Notes (Signed)
 [facility] Counselor/Therapist Progress Note  Patient ID: Mary [name], MRN: [id number]    Date: 09/03/24  Time Spent: 10:00 am - 10:30 am : 24 minutes  (Patient's internet was problematic so we met for 13 minutes by video, then another 6 minutes by phone, then 5 minutes by video)     Treatment Type: Individual Therapy.  Reported Symptoms: Patient was anxious about her medical issues and her son, [place]'s, medical issues  Mental Status Exam: Appearance:  Casual     Behavior: Appropriate  Motor: Normal  Speech/Language:  Clear and Coherent  Affect: Appropriate  Mood: anxious  Thought process: normal  Thought content:   WNL  Sensory/Perceptual disturbances:   WNL  Orientation: oriented to person, place, and time/date  Attention: Good  Concentration: Good  Memory: WNL  Fund of knowledge:  Good  Insight:   Good  Judgment:  Good  Impulse Control: Good   Risk Assessment: Danger to Self:  No Self-injurious Behavior: No Danger to Others: No Duty to Warn:no Physical Aggression / Violence:No  Access to Firearms a concern: No  Gang Involvement:No   Subjective:   Mary [name] participated from home, via video, and consented to treatment. I discussed the limitations of evaluation and management by telemedicine and the availability of in person appointments. The patient expressed understanding and agreed to proceed.  Therapist participated from home office.  Scottlynn reviewed the events of the past week.   Patient reported having difficulty not worrying because there is so much going on in her life and the lives of her children. Patient was also feeling hopeless about her future employment situation.  Interventions: Cognitive Behavioral Therapy  Diagnosis: Major depressive disorder, recurrent episode, mild (HCC) [F33.0]  GAD (generalized anxiety disorder) [F41.1]  Psychiatric Treatment: Yes , via psychiatrist  Treatment Plan:  Client Abilities/Strengths [name]  is committed to being the best mother she can be to her 5 children.   Support System: Jacob significant other  Client Treatment Preferences OPT  Client Statement of Needs [name] would like to learn techniques to decrease her anxiety and depression.  Treatment Level Weekly  Symptoms  Patient experiences symptoms of anxiety and depression.   Goals:   Target Date: 02/13/25 Frequency: Weekly  Progress: 25% Modality: individual    Therapist will provide referrals for additional resources as appropriate.  Therapist will provide psycho-education regarding [name]'s diagnosis and corresponding treatment approaches and interventions. [name] will support the patient's ability to achieve the goals identified. will employ CBT, BA, Problem-solving, Solution Focused, Mindfulness,  coping skills, & other evidenced-based practices will be used to promote progress towards healthy functioning to help manage decrease symptoms associated with their diagnosis.   Reduce overall level, frequency, and intensity of the feelings of depression, anxiety and panic evidenced by decreased overall symptoms from 6 to 7 days/week to 0 to 1 days/week per client report for at least 3 consecutive months. Verbally express understanding of the relationship between feelings of depression and anxiety and their impact on thinking patterns and behaviors. Verbalize an understanding of the role that distorted thinking plays in creating fears, excessive worry, and ruminations.  [name] participated in the creation of the treatment plan)    [name]

## 2024-09-04 ENCOUNTER — Ambulatory Visit: Admitting: Psychology

## 2024-09-04 DIAGNOSIS — F411 Generalized anxiety disorder: Secondary | ICD-10-CM | POA: Diagnosis not present

## 2024-09-04 DIAGNOSIS — F330 Major depressive disorder, recurrent, mild: Secondary | ICD-10-CM

## 2024-09-04 NOTE — Progress Notes (Signed)
 [facility] Counselor/Therapist Progress Note  Patient ID: Mary [name], MRN: [id number]    Date: 09/04/24  Time Spent: 10:15 am - 10:53 am :38 minutes (Pt was running late due to oversleeping).   Treatment Type: Individual Therapy.  Reported Symptoms/Chief complaint: Patient was concerned about her son, [place], not wanting to go to school and patient was feeling nervous and feeling down.  Mental Status Exam: Appearance:  Casual     Behavior: Sharing  Motor: Normal  Speech/Language:  Clear and Coherent  Affect: Appropriate  Mood: anxious and depressed  Thought process: normal  Thought content:   WNL  Sensory/Perceptual disturbances:   WNL  Orientation: oriented to person, place, and time/date  Attention: Good  Concentration: Good  Memory: WNL  Fund of knowledge:  Good  Insight:   Good  Judgment:  Good  Impulse Control: Good   Risk Assessment: Danger to Self:  No Self-injurious Behavior: No Danger to Others: No Duty to Warn:no Physical Aggression / Violence:No  Access to Firearms a concern: No  Gang Involvement:No   Subjective:   [name] participated from home, via video, and consented to treatment. I discussed the limitations of evaluation and management by telemedicine and the availability of in person appointments. The patient expressed understanding and agreed to proceed.  Therapist participated from home office.  Burnadette reviewed the events of the past week.   Patient reported that she was worried about her son [place] and the issues he was having in school. Patient also expressed feeling hopeless about her being able to get a helpful answer from her medical providers.   Interventions: Cognitive Behavioral Therapy  Diagnosis: Major depressive disorder, recurrent episode, mild (HCC) [F33.0]  GAD (generalized anxiety disorder) [F41.1]  Psychiatric Treatment: Yes, via psychiatrist  Treatment Plan:  Client Abilities/Strengths Nakayla is  trying to be optimistic but it is difficult because so much is going on in her life.   Support System: [name] her boyfrend who lives with her  Client Treatment Preferences OPT  Client Statement of Needs Denym would like to learn techniques to decrease her anxiety.   Treatment Level Weekly  Symptoms  Kiearra experiences symptoms of anxiety and depression.   Goals:   Target Date: 02/13/25 Frequency: Weekly  Progress: 25% Modality: individual    Therapist will provide referrals for additional resources as appropriate.  Therapist will provide psycho-education regarding Mariadelcarmen's diagnosis and corresponding treatment approaches and interventions. [name] will support the patient's ability to achieve the goals identified. will employ CBT, BA, Problem-solving, Solution Focused, Mindfulness,  coping skills, & other evidenced-based practices will be used to promote progress towards healthy functioning to help manage decrease symptoms associated with their diagnosis.   Reduce overall level, frequency, and intensity of the feelings of depression, anxiety and panic evidenced by decreased overall symptoms from 6 to 7 days/week to 0 to 1 days/week per client report for at least 3 consecutive months. Verbally express understanding of the relationship between feelings of depression and anxiety and their impact on thinking patterns and behaviors. Verbalize an understanding of the role that distorted thinking plays in creating fears, excessive worry, and ruminations.  [name] participated in the creation of the treatment plan)     [name]

## 2024-09-05 DIAGNOSIS — R002 Palpitations: Secondary | ICD-10-CM | POA: Diagnosis not present

## 2024-09-07 NOTE — Progress Notes (Signed)
                Mary [name]

## 2024-09-08 ENCOUNTER — Ambulatory Visit (INDEPENDENT_AMBULATORY_CARE_PROVIDER_SITE_OTHER): Admitting: *Deleted

## 2024-09-08 ENCOUNTER — Other Ambulatory Visit: Payer: Self-pay

## 2024-09-08 VITALS — BP 113/77 | HR 87 | Ht 64.0 in | Wt 180.3 lb

## 2024-09-08 DIAGNOSIS — Z3042 Encounter for surveillance of injectable contraceptive: Secondary | ICD-10-CM | POA: Diagnosis not present

## 2024-09-08 MED ORDER — MEDROXYPROGESTERONE ACETATE 150 MG/ML IM SUSP
150.0000 mg | Freq: Once | INTRAMUSCULAR | Status: AC
  Administered 2024-09-08: 150 mg via INTRAMUSCULAR

## 2024-09-08 NOTE — Progress Notes (Signed)
 Depo Provera  150 mg IM administered as scheduled. Pt tolerated well. Next injection due 11/25/11/29. Next Annual Gyn exam due after 01/09/25. She states her partner will be having Vasectomy soon and she will likely not receive next scheduled injection however she is concerned about return of AUB after stopping Depo injections. Pt was advised to schedule appt with provider if heavy bleeding occurs. She voiced understanding of all information given.

## 2024-09-09 ENCOUNTER — Ambulatory Visit: Admitting: Psychology

## 2024-09-09 DIAGNOSIS — R002 Palpitations: Secondary | ICD-10-CM

## 2024-09-12 ENCOUNTER — Ambulatory Visit (INDEPENDENT_AMBULATORY_CARE_PROVIDER_SITE_OTHER): Admitting: Psychology

## 2024-09-12 ENCOUNTER — Other Ambulatory Visit

## 2024-09-12 DIAGNOSIS — D509 Iron deficiency anemia, unspecified: Secondary | ICD-10-CM | POA: Diagnosis not present

## 2024-09-12 DIAGNOSIS — F411 Generalized anxiety disorder: Secondary | ICD-10-CM | POA: Diagnosis not present

## 2024-09-12 DIAGNOSIS — F330 Major depressive disorder, recurrent, mild: Secondary | ICD-10-CM | POA: Diagnosis not present

## 2024-09-12 LAB — IBC + FERRITIN
Ferritin: 24.7 ng/mL (ref 10.0–291.0)
Iron: 87 ug/dL (ref 42–145)
Saturation Ratios: 21.1 % (ref 20.0–50.0)
TIBC: 411.6 ug/dL (ref 250.0–450.0)
Transferrin: 294 mg/dL (ref 212.0–360.0)

## 2024-09-12 LAB — IRON: Iron: 87 ug/dL (ref 42–145)

## 2024-09-12 NOTE — Progress Notes (Signed)
 [facility] Counselor/Therapist Progress Note  Patient ID: Charlize Hathaway, MRN: [id number]    Date: 09/12/24  Time Spent: 1:03 pm - 1:33 pm : 30 minutes  Treatment Type: Individual Therapy.  Reported Symptoms/Chief Complaint: Patient was on edge about feeling overwhelmed by her medical issues.   Mental Status Exam: Appearance:  NA     Behavior: Sharing  Motor: Restlestness  Speech/Language:  Pressured  Affect: Depressed  Mood: anxious  Thought process: normal  Thought content:   WNL  Sensory/Perceptual disturbances:   WNL  Orientation: oriented to person, place, and time/date  Attention: Good  Concentration: Good  Memory: WNL  Fund of knowledge:  Good  Insight:   Good  Judgment:  Good  Impulse Control: Good   Risk Assessment: Danger to Self:  No Self-injurious Behavior: No Danger to Others: No Duty to Warn:no Physical Aggression / Violence:No  Access to Firearms a concern: No  Gang Involvement:No   Subjective:   [name] participated from home, via video, and consented to treatment. I discussed the limitations of evaluation and management by telemedicine and the availability of in person appointments. The patient expressed understanding and agreed to proceed.  Therapist participated from home office.  Mykah reviewed the events of the past week.   Patient felt anxious about all that she has weighing on her mind right now, she also reported decreased energy, and difficulty concentrating. Patient also expressed concern about the side effects of the depo shot that she has been taking for over a year.   Interventions: Cognitive Behavioral Therapy  Diagnosis: Major depressive disorder, recurrent episode, mild F33.0 Generalized Anxiety Disorder, 41.1  Psychiatric Treatment: Yes , via psychiatrist  Treatment Plan:  Client Abilities/Strengths Tiahna is trying to be optimistic but it is difficult because so much is going on in her life.  Support  System: [name] her boyfriend who lives with her  Client Treatment Preferences OPT  Client Statement of Needs Taylormarie would like to learn techniques to decrease her anxiety.    Treatment Level Weekly  Symptoms    Bruna experiences symptoms of anxiety and depression  Goals:   Target Date: 02/13/25 Frequency: Weekly  Progress: 25% Modality: individual    Therapist will provide referrals for additional resources as appropriate.  Therapist will provide psycho-education regarding Latrice's diagnosis and corresponding treatment approaches and interventions. [name] will support the patient's ability to achieve the goals identified. will employ CBT, BA, Problem-solving, Solution Focused, Mindfulness,  coping skills, & other evidenced-based practices will be used to promote progress towards healthy functioning to help manage decrease symptoms associated with their diagnosis.   Reduce overall level, frequency, and intensity of the feelings of depression, anxiety and panic evidenced by decreased overall symptoms from 6 to 7 days/week to 0 to 1 days/week per client report for at least 3 consecutive months. Verbally express understanding of the relationship between feelings of depression and anxiety and their impact on thinking patterns and behaviors. Verbalize an understanding of the role that distorted thinking plays in creating fears, excessive worry, and ruminations.  [name] participated in the creation of the treatment plan)    [name]

## 2024-09-15 ENCOUNTER — Ambulatory Visit: Payer: Self-pay | Admitting: Internal Medicine

## 2024-09-15 DIAGNOSIS — D509 Iron deficiency anemia, unspecified: Secondary | ICD-10-CM

## 2024-09-16 ENCOUNTER — Ambulatory Visit: Attending: Cardiology | Admitting: Cardiology

## 2024-09-16 ENCOUNTER — Encounter: Payer: Self-pay | Admitting: Cardiology

## 2024-09-16 VITALS — BP 104/66 | HR 81 | Ht 64.0 in | Wt 183.5 lb

## 2024-09-16 DIAGNOSIS — G935 Compression of brain: Secondary | ICD-10-CM | POA: Diagnosis not present

## 2024-09-16 DIAGNOSIS — R002 Palpitations: Secondary | ICD-10-CM | POA: Diagnosis not present

## 2024-09-16 DIAGNOSIS — R42 Dizziness and giddiness: Secondary | ICD-10-CM

## 2024-09-16 DIAGNOSIS — G4730 Sleep apnea, unspecified: Secondary | ICD-10-CM | POA: Diagnosis not present

## 2024-09-16 DIAGNOSIS — R0683 Snoring: Secondary | ICD-10-CM

## 2024-09-16 DIAGNOSIS — R0789 Other chest pain: Secondary | ICD-10-CM | POA: Diagnosis not present

## 2024-09-16 DIAGNOSIS — R072 Precordial pain: Secondary | ICD-10-CM

## 2024-09-16 NOTE — Patient Instructions (Signed)
 Medication Instructions:  Your physician recommends that you continue on your current medications as directed. Please refer to the Current Medication list given to you today.  *If you need a refill on your cardiac medications before your next appointment, please call your pharmacy*  Lab Work: NONE If you have labs (blood work) drawn today and your tests are completely normal, you will receive your results only by: MyChart Message (if you have MyChart) OR A paper copy in the mail If you have any lab test that is abnormal or we need to change your treatment, we will call you to review the results.  Testing/Procedures: [facility] - someone will reach out to you in roughly 6 weeks   Follow-Up: At [facility], you and your health needs are our priority.  As part of our continuing mission to provide you with exceptional heart care, our providers are all part of one team.  This team includes your primary Cardiologist (physician) and Advanced Practice Providers or APPs (Physician Assistants and Nurse Practitioners) who all work together to provide you with the care you need, when you need it.  Your next appointment:   4 month(s)  Provider:   Kardie Tobb, DO    We recommend signing up for the patient portal called MyChart.  Sign up information is provided on this After Visit Summary.  MyChart is used to connect with patients for Virtual Visits (Telemedicine).  Patients are able to view lab/test results, encounter notes, upcoming appointments, etc.  Non-urgent messages can be sent to your provider as well.   To learn more about what you can do with MyChart, go to [email or web address].

## 2024-09-16 NOTE — Progress Notes (Unsigned)
 Cardiology Office Note    Date:  09/17/2024  ID:  Mary [name], DOB 11/21/1986, MRN [id number] PCP:  Theophilus Andrews, Tully GRADE, MD  Cardiologist:  Dub Huntsman, DO  Electrophysiologist:  None   Chief Complaint: Follow up for palpitations  History of Present Illness: .   Mary [name] is a 38 y.o. female with visit-pertinent history of palpitations and chest pain, Chiari malformation, PTSD and near syncope.  Patient previously followed with Dr. Raford from 2019 through 2020.  Cardiac monitor at that time indicated predominant underlying rhythm was sinus rhythm with an average heart rate of 94 bpm, max heart rate 143 bpm, noted to have rare PACs.  Echocardiogram at that time indicated LVEF 60 to 65%, normal wall thickness, wall motion was normal.  Patient was last seen by Dr. Huntsman on 11/10/2022, patient continued experiencing palpitations however was not taking propranolol .  Patient was last seen in clinic on 08/08/2024 regarding palpitations and chest discomfort.  Patient reported that for the last year and a half she had significant stressors.  She reported that starting in September 2024 she started having sharp chest pain that was felt to likely be costochondritis, initially treated with prednisone  with improvement symptoms however returned a few months later.  Despite that her PCP was related to increased stressors.  Patient reported various types of chest pain, not specifically associate with exertion, some chest pain is constantly present and changes with palpation, described other chest pain that was more sharp in class a few minutes.  She also reported ongoing palpitations intermittently feeling as though his heart was racing.  It was noted that patient has history of dizziness, previously evaluated by vestibular therapy and not felt to be related to vertigo, has history of vestibular migraines and has history of a Chiari malformation that is been since repaired.  Echocardiogram and 2-week  cardiac monitor was ordered.  Echocardiogram 09/02/2024 indicated LVEF 55 to 60%, no RWMA, diastolic parameters were normal, RV systolic function and size was normal, no significant valvular abnormalities.  Cardiac monitor worn for 13 days and 23 hours indicated an average heart rate of 93 bpm ranging from 63 to 159 bpm.  Predominant underlying rhythm was sinus rhythm with rare ectopy, symptoms associated with sinus rhythm and sinus tachycardia.  Today patient presents for follow-up.  She reports that she has been doing overall better.  She continues to note some intermittent low blood pressures at home.  She continues to note an occasional sharp pain in her chest, on further discussion this is improved with palpation or movement within the area.  She also notes some chest discomfort while eating and after eating such as when lying down flat, associated with a sour taste or burning in the back of the throat.  Patient notes that [place] and has been walking laps and tolerating very well without increased chest pain or shortness of breath.  Patient questions if she has been having increased problems as a result of her Depo-Provera , plans to discontinue use.  Reviewed patient's recent echocardiogram and cardiac monitor which was overall reassuring. ROS: .   Today she denies shortness of breath, lower extremity edema, palpitations, melena, hematuria, hemoptysis, diaphoresis, presyncope, syncope, orthopnea, and PND.  All other systems are reviewed and otherwise negative. Studies Reviewed: SABRA   EKG:  EKG is ordered today, personally reviewed, demonstrating  EKG Interpretation Date/Time:  Tuesday September 16 2024 11:22:22 EDT Ventricular Rate:  81 PR Interval:  148 QRS Duration:  90 QT Interval:  362 QTC Calculation: 420 R Axis:   77  Text Interpretation: Normal sinus rhythm with sinus arrhythmia When compared with ECG of 08-Aug-2024 10:43, No significant change was found Confirmed  by Donnivan Villena [phone]) on 09/16/2024 11:52:47 AM   CV Studies: Cardiac studies reviewed are outlined and summarized above. Otherwise please see EMR for full report. Cardiac Studies & Procedures   ______________________________________________________________________________________________     ECHOCARDIOGRAM  ECHOCARDIOGRAM COMPLETE 09/02/2024  Narrative ECHOCARDIOGRAM REPORT    Patient Name:   Mary [name] Date of Exam: 09/02/2024 Medical Rec #:  [id number]     Height:       64.0 in Accession #:    [id number]    Weight:       178.0 lb Date of Birth:  1986-03-30    BSA:          1.862 m Patient Age:    37 years      BP:           118/83 mmHg Patient Gender: F             HR:           82 bpm. Exam Location:  [facility]  Procedure: 2D Echo, Cardiac Doppler and Color Doppler (Both Spectral and Color Flow Doppler were utilized during procedure).  Indications:    Chest Discomfort  History:        Patient has prior history of Echocardiogram examinations, most recent 08/29/2018. Risk Factors:Dyslipidemia and Current Smoker.  Sonographer:    Aida Pizza RCS Referring Phys: [id number] Araiya Tilmon D Sylver Vantassell  IMPRESSIONS   1. Left ventricular ejection fraction, by estimation, is 55 to 60%. The left ventricle has normal function. The left ventricle has no regional wall motion abnormalities. Left ventricular diastolic parameters were normal. 2. Right ventricular systolic function is normal. The right ventricular size is normal. Tricuspid regurgitation signal is inadequate for assessing PA pressure. 3. The mitral valve is grossly normal. Trivial mitral valve regurgitation. 4. The aortic valve is tricuspid. Aortic valve regurgitation is not visualized. No aortic stenosis is present. 5. The inferior vena cava is normal in size with greater than 50% respiratory variability, suggesting right atrial pressure of 3 mmHg.  Comparison(s): Prior images unable to be directly viewed.  FINDINGS Left  Ventricle: Left ventricular ejection fraction, by estimation, is 55 to 60%. The left ventricle has normal function. The left ventricle has no regional wall motion abnormalities. The left ventricular internal cavity size was normal in size. There is no left ventricular hypertrophy. Left ventricular diastolic parameters were normal.  Right Ventricle: The right ventricular size is normal. No increase in right ventricular wall thickness. Right ventricular systolic function is normal. Tricuspid regurgitation signal is inadequate for assessing PA pressure.  Left [facility]: Left atrial size was normal in size.  Right [facility]: Right atrial size was normal in size.  Pericardium: There is no evidence of pericardial effusion.  Mitral Valve: The mitral valve is grossly normal. Trivial mitral valve regurgitation.  Tricuspid Valve: The tricuspid valve is grossly normal. Tricuspid valve regurgitation is trivial.  Aortic Valve: The aortic valve is tricuspid. Aortic valve regurgitation is not visualized. No aortic stenosis is present.  Pulmonic Valve: The pulmonic valve was grossly normal. Pulmonic valve regurgitation is trivial.  Aorta: The aortic root is normal in size and structure.  Venous: The inferior vena cava is normal in size with greater than 50% respiratory variability, suggesting right atrial pressure of 3 mmHg.  IAS/Shunts: No  atrial level shunt detected by color flow Doppler.  Additional Comments: 3D was performed not requiring image post processing on an independent workstation and was indeterminate.   LEFT VENTRICLE PLAX 2D LVIDd:         4.80 cm     Diastology LVIDs:         3.30 cm     LV e' medial:    12.70 cm/s LV PW:         0.90 cm     LV E/e' medial:  6.4 LV IVS:        0.80 cm     LV e' lateral:   13.10 cm/s LVOT diam:     1.80 cm     LV E/e' lateral: 6.2 LV SV:         53 LV SV Index:   28 LVOT Area:     2.54 cm  LV Volumes (MOD) LV vol d, MOD A2C: 72.1 ml LV vol d, MOD  A4C: 67.0 ml LV vol s, MOD A2C: 30.7 ml LV vol s, MOD A4C: 32.2 ml LV SV MOD A2C:     41.4 ml LV SV MOD A4C:     67.0 ml LV SV MOD BP:      37.6 ml  RIGHT VENTRICLE RV S prime:     11.10 cm/s TAPSE (M-mode): 2.0 cm  LEFT ATRIUM             Index        RIGHT ATRIUM           Index LA diam:        3.20 cm 1.72 cm/m   RA Area:     12.70 cm LA Vol (A2C):   23.7 ml 12.73 ml/m  RA Volume:   30.30 ml  16.27 ml/m LA Vol (A4C):   29.5 ml 15.84 ml/m LA Biplane Vol: 28.4 ml 15.25 ml/m AORTIC VALVE LVOT Vmax:   98.20 cm/s LVOT Vmean:  67.400 cm/s LVOT VTI:    0.208 m  AORTA Ao Root diam: 2.90 cm  MITRAL VALVE MV Area (PHT): 4.15 cm    SHUNTS MV Decel Time: 183 msec    Systemic VTI:  0.21 m MV E velocity: 81.70 cm/s  Systemic Diam: 1.80 cm MV A velocity: 48.80 cm/s MV E/A ratio:  1.67  Jayson Sierras MD Electronically signed by Jayson Sierras MD Signature Date/Time: 09/02/2024/12:01:16 PM    Final    MONITORS  LONG TERM MONITOR (3-14 DAYS) 09/05/2024  Narrative Patch Wear Time:  13 days and 23 hours (2025-09-03T18:13:37-0400 to 2025-09-17T18:09:23-0400)  Patient had a min HR of 63 bpm, max HR of 159 bpm, and avg HR of 93 bpm. Predominant underlying rhythm was Sinus Rhythm. Isolated SVEs were rare (<1.0%), SVE Couplets were rare (<1.0%), and no SVE Triplets were present. Isolated VEs were rare (<1.0%), VE  Couplets were rare (<1.0%), and no VE Triplets were present. Inverted QRS complexes possibly due to inverted placement of device.  Symptoms associated with sinus rhythm and sinus tachycardia.  Conclusion: Normal/unremarkable study.       ______________________________________________________________________________________________      Current Reported Medications:.    Current Meds  Medication Sig   albuterol  (VENTOLIN  HFA) 108 (90 Base) MCG/ACT inhaler Inhale 2 puffs into the lungs every 4 (four) hours as needed.   aspirin-acetaminophen -caffeine  (EXCEDRIN  MIGRAINE) 250-250-65 MG tablet Take by mouth every 6 (six) hours as needed for headache.   clonazePAM (KLONOPIN) 0.5 MG tablet Take 0.5  mg by mouth 2 (two) times daily.   EPINEPHrine  (EPIPEN  2-PAK) 0.3 mg/0.3 mL IJ SOAJ injection Inject 0.3 mg into the muscle as needed for anaphylaxis.   fluticasone  (FLONASE ) 50 MCG/ACT nasal spray Place 1 spray into both nostrils daily as needed for allergies or rhinitis.   ibuprofen  (ADVIL ) 800 MG tablet Take 800 mg by mouth every 8 (eight) hours as needed.   montelukast  (SINGULAIR ) 10 MG tablet Take 1 tablet (10 mg total) by mouth at bedtime.   ondansetron  (ZOFRAN -ODT) 4 MG disintegrating tablet Take 1 tablet (4 mg total) by mouth every 8 (eight) hours as needed.   Rimegepant Sulfate (NURTEC) 75 MG TBDP Take 1 tablet (75 mg total) by mouth daily as needed.   Physical Exam:    VS:  BP 104/66   Pulse 81   Ht 5' 4 (1.626 m)   Wt 183 lb 8 oz (83.2 kg)   SpO2 99%   BMI 31.50 kg/m    Wt Readings from Last 3 Encounters:  09/16/24 183 lb 8 oz (83.2 kg)  09/08/24 180 lb 4.8 oz (81.8 kg)  08/08/24 178 lb (80.7 kg)    GEN: Well nourished, well developed in no acute distress NECK: No JVD; No carotid bruits CARDIAC: RRR, no murmurs, rubs, gallops RESPIRATORY:  Clear to auscultation without rales, wheezing or rhonchi  ABDOMEN: Soft, non-tender, non-distended EXTREMITIES:  No edema; No acute deformity     Asessement and Plan:.    Palpitations: Cardiac monitor worn for 13 days and 23 hours indicated an average heart rate of 93 bpm ranging from 63 to 159 bpm.  Predominant underlying rhythm was sinus rhythm with rare ectopy, symptoms associated with sinus rhythm and sinus tachycardia. Discussed propanolol however patient notes concerns given albuterol  use, given overall reassuring cardiac monitor through shared decision making elected to not trial medications at this time.  She will continue to monitor her symptoms.  Encouraged increased hydration and decreased  caffeine  intake.  Precordial pain: Patient notes different types of chest discomfort, she notes a sharp pain in her chest that improves when she turns or twists or palpates the area, reassuring that this is likely musculoskeletal in nature.  She also reports a an aching sensation in her chest associated with eating or laying down flat, notes when this occurs that she typically will have a sour sensation in her mouth and burning in her throat, likely related to acid reflux.  Patient notes that she [place] and is trying to regularly walk laps and tolerates very well without any chest discomfort.  She denies any chest pain on exertion.  Will not pursue ischemic evaluation at this time, patient in agreement with this given reassuring echocardiogram and monitor.  Reviewed ED precautions.  Chiari malformation/?dysautonomia: s/p decompression in 2019.  Patient notes increased dizziness and position changes, has been followed by neurology however notes she is considering following back up with her neurosurgeon as well given concerns with depro provera  use with history of meningioma.  Patient notes that her blood pressure has been soft at home, notes that she does have history of vestibular migraines as well as the Chiari malformation.  Discussed that she may have an element of dysautonomia with slight elevations in heart rate and lower blood pressures, although orthostatic vitals at last visit were normal.  Encouraged patient to try compression stockings, increased salt and fluid intake.  She plans to also follow-up with her neurosurgeon as well.   Tobacco  use: Patient reports reduced tobacco use, complete cessation encouraged.  Sleep disordered breathing: Patient notes significant history of snoring and often feels fatigued.  Her STOP-BANG score is 3.  She would like to proceed with at home sleep study. Check [facility] sleep study.    Disposition: F/u with Dr. Sheena in 3-4  months.   Signed, Jonmichael Beadnell D Chuong Casebeer, NP

## 2024-09-17 ENCOUNTER — Encounter: Payer: Self-pay | Admitting: Neurology

## 2024-09-17 ENCOUNTER — Encounter: Payer: Self-pay | Admitting: Cardiology

## 2024-09-17 DIAGNOSIS — M542 Cervicalgia: Secondary | ICD-10-CM

## 2024-09-19 ENCOUNTER — Ambulatory Visit: Admitting: Psychology

## 2024-09-19 DIAGNOSIS — F411 Generalized anxiety disorder: Secondary | ICD-10-CM

## 2024-09-19 DIAGNOSIS — F330 Major depressive disorder, recurrent, mild: Secondary | ICD-10-CM | POA: Diagnosis not present

## 2024-09-19 NOTE — Progress Notes (Signed)
 [facility] Counselor/Therapist Progress Note  Patient ID: Mary Mary [name], MRN: [id number]    Date: 09/19/24  Time Spent: 10:00 am -10:53 am : 53 minutes    Treatment Type: Individual Therapy.  Reported Symptoms: Patient was in a great deal of pain, but was overjoyed at the birth of a baby in the family.   Mental Status Exam: Appearance:  Well Groomed     Behavior: Appropriate  Motor: Normal  Speech/Language:  Clear and Coherent  Affect: Appropriate  Mood: anxious  Thought process: normal  Thought content:   WNL  Sensory/Perceptual disturbances:   WNL  Orientation: oriented to person, place, and time/date  Attention: Good  Concentration: Good  Memory: WNL  Fund of knowledge:  Good  Insight:   Good  Judgment:  Good  Impulse Control: Good   Risk Assessment: Danger to Self:  No Self-injurious Behavior: No Danger to Others: No Duty to Warn:no Physical Aggression / Violence:No  Access to Firearms a concern: No  Gang Involvement:No   Subjective:   Mary Mary [name] participated from home, via video, and consented to treatment. I discussed the limitations of evaluation and management by telemedicine and the availability of in person appointments. The patient expressed understanding and agreed to proceed.  Therapist participated from home office.  Mary [name] reviewed the events of the past week.   Patient felt anxious about all that she has weighing on her mind right now, she also reported decreased energy, and difficulty concentrating. Patient also expressed concern about the side effects of the depo shot that she has been taking for over a year. Patient was also diagnosed with dysautonomia, and patient felt down and depressed about the Mary [name]. Another of the patient's doctors, Dr. Dale Spring with [facility] highly recommended the book-Victory over Vestibular Migraine--   Interventions: Cognitive Behavioral Therapy  Mary [name]:   Major depressive disorder,  recurrent episode, mild F33.0 Generalized Anxiety Disorder, 41.1    Psychiatric Treatment: Yes , via psychiatrist  Treatment Plan:  Client Abilities/Strengths Mary Mary [name] is trying to be optimistic but it is difficult because so much is going on in her life.   Support System: Mary Mary [name] her boyfriend who lives with her  Client Treatment Preferences OPT  Client Statement of Needs Mary Mary [name] would like to learn techniques to decrease her anxiety   Treatment Level Weekly  Symptoms  Mary Mary [name] experiences symptoms of anxiety and depression.  Goals:   Target Date: 3.6.26 Frequency: Weekly  Progress: 30% Modality: individual    Therapist will provide referrals for additional resources as appropriate.  Therapist will provide psycho-education regarding Mary Mary [name] and corresponding treatment approaches and interventions. Mary Mary [name] will support the patient's ability to achieve the goals identified. will employ CBT, BA, Problem-solving, Solution Focused, Mindfulness,  coping skills, & other evidenced-based practices will be used to promote progress towards healthy functioning to help manage decrease symptoms associated with their Mary [name].   Reduce overall level, frequency, and intensity of the feelings of depression, anxiety and panic evidenced by decreased overall symptoms from 6 to 7 days/week to 0 to 1 days/week per client report for at least 3 consecutive months. Verbally express understanding of the relationship between feelings of depression and anxiety and their impact on thinking patterns and behaviors. Verbalize an understanding of the role that distorted thinking plays in creating fears, excessive worry, and ruminations.  Mary [name] participated in the creation of the treatment plan)    Mary Mary [name]

## 2024-09-23 ENCOUNTER — Other Ambulatory Visit: Payer: Self-pay

## 2024-09-23 ENCOUNTER — Ambulatory Visit: Admitting: Psychology

## 2024-09-23 DIAGNOSIS — F330 Major depressive disorder, recurrent, mild: Secondary | ICD-10-CM

## 2024-09-23 DIAGNOSIS — F411 Generalized anxiety disorder: Secondary | ICD-10-CM | POA: Diagnosis not present

## 2024-09-23 DIAGNOSIS — R42 Dizziness and giddiness: Secondary | ICD-10-CM

## 2024-09-23 NOTE — Progress Notes (Signed)
 Referral for [facility] placed.

## 2024-09-23 NOTE — Addendum Note (Signed)
 Addended by: MELIDA ROLIN HERO on: 09/23/2024 06:01 PM   Modules accepted: Orders

## 2024-09-23 NOTE — Progress Notes (Signed)
 [facility] Counselor/Therapist Progress Note  Patient ID: Mary [name], MRN: [id number]    Date: 09/23/24  Time Spent: 1:00 pm -1:53 pm : 53 minutes    Treatment Type: Individual Therapy.  Reported Symptoms/Chief Complaint: I am extremely frustrated by my medical care. It's been a montrocious situation.   Mental Status Exam: Appearance:  Casual     Behavior: Sharing  Motor: Restlestness  Speech/Language:  Clear and Coherent  Affect: Labile  Mood: anxious  Thought process: circumstantial  Thought content:   WNL  Sensory/Perceptual disturbances:   WNL  Orientation: oriented to person, place, and time/date  Attention: Good  Concentration: Good  Memory: WNL  Fund of knowledge:  Good  Insight:   Good  Judgment:  Good  Impulse Control: Fair   Risk Assessment: Danger to Self:  No Self-injurious Behavior: No Danger to Others: No Duty to Warn:no Physical Aggression / Violence:No  Access to Firearms a concern: No  Gang Involvement:No   Subjective:   Mary [name] participated from home, via video, and consented to treatment. I discussed the limitations of evaluation and management by telemedicine and the availability of in person appointments. The patient expressed understanding and agreed to proceed.  Therapist participated from home office.  Baker reviewed the events of the past week.   Patient reported being frustrated with her medical care and feeling worried and down about the medical care she has has been receiving. Patient also was concerned about Mary [name] not being able to find a job, and worrying about her and her family ending up being homeless.   Interventions: Cognitive Behavioral Therapy  Diagnosis:  Major depressive disorder, recurrent episode, mild F33.0 Generalized Anxiety Disorder, 41.1  Psychiatric Treatment: Yes , via psychiatrist  Treatment Plan:  Client Abilities/Strengths Mary [name] is trying to be optimistic but it is difficult because  so much is going on in her life.   Support System: Mary [name] her boyfriend who lives with her  Client Treatment Preferences OPT  Client Statement of Needs Mary [name] would like to be optimistic but it is difficult because so much is going on in her life.   Treatment Level Weekly  Symptoms  Mary [name] experiences symptoms of anxiety and depression.   Goals:   Target Date: 3.6.26 Frequency: Weekly  Progress: 30% Modality: individual    Therapist will provide referrals for additional resources as appropriate.  Therapist will provide psycho-education regarding Mary [name]'s diagnosis and corresponding treatment approaches and interventions. Mary [name] will support the patient's ability to achieve the goals identified. will employ CBT, BA, Problem-solving, Solution Focused, Mindfulness,  coping skills, & other evidenced-based practices will be used to promote progress towards healthy functioning to help manage decrease symptoms associated with their diagnosis.   Reduce overall level, frequency, and intensity of the feelings of depression, anxiety and panic evidenced by decreased overall symptoms from 6 to 7 days/week to 0 to 1 days/week per client report for at least 3 consecutive months. Verbally express understanding of the relationship between feelings of depression and anxiety and their impact on thinking patterns and behaviors. Verbalize an understanding of the role that distorted thinking plays in creating fears, excessive worry, and ruminations.  Mary [name] participated in the creation of the treatment plan)    Mary [name]

## 2024-09-24 ENCOUNTER — Telehealth: Payer: Self-pay | Admitting: Cardiology

## 2024-09-24 NOTE — Telephone Encounter (Signed)
 Mollie, Do you mind look into this? Thank you!

## 2024-09-24 NOTE — Telephone Encounter (Signed)
 Pt called in asking for dysautonomia referral to be faxed here. They will not be able to view via EPIC.    The referral has been placed for [facility].  Dr. Patsy Maha  Address: [address], [place], [place] [address] Phone: [phone]  Fax: [phone]

## 2024-09-30 ENCOUNTER — Encounter: Payer: Self-pay | Admitting: Physical Therapy

## 2024-09-30 ENCOUNTER — Other Ambulatory Visit: Payer: Self-pay

## 2024-09-30 ENCOUNTER — Ambulatory Visit: Attending: Neurology | Admitting: Physical Therapy

## 2024-09-30 ENCOUNTER — Ambulatory Visit: Admitting: Psychology

## 2024-09-30 DIAGNOSIS — R42 Dizziness and giddiness: Secondary | ICD-10-CM | POA: Diagnosis present

## 2024-09-30 DIAGNOSIS — M62838 Other muscle spasm: Secondary | ICD-10-CM | POA: Insufficient documentation

## 2024-09-30 DIAGNOSIS — M542 Cervicalgia: Secondary | ICD-10-CM | POA: Insufficient documentation

## 2024-09-30 NOTE — Therapy (Signed)
 OUTPATIENT PHYSICAL THERAPY CERVICAL EVALUATION   Patient Name: Mary [name] MRN: [id number] DOB:01-25-1986, 38 y.o., female Today's Date: 09/30/2024  END OF SESSION:  PT End of Session - 09/30/24 1056     Visit Number 1    Number of Visits 6    Date for Recertification  10/28/24    PT Start Time 1017    PT Stop Time 1110    PT Time Calculation (min) 53 min    Activity Tolerance Patient tolerated treatment well    Behavior During Therapy WFL for tasks assessed/performed          Past Medical History:  Diagnosis Date   Asthma    Atypical squamous cell changes of undetermined significance (ASCUS) on cervical cytology with positive high risk human papilloma virus (HPV) 07/08/2013   Overview:   ASCUS with positive high risk HPV 07/08/13  Mild to moderate dysplasia on colpo 02/10/14   Plan for repeat colpo in 6 months (Due 08/13/14) schedulers flagged.  ASCUS with positive high risk HPV 07/08/13  Mild to moderate dysplasia on colpo 02/10/14   Plan for repeat colpo in 6 months (Due 08/13/14) schedulers flagged.   Bronchitis 01/31/2016   Chiari malformation type I (HCC)    Depression    GERD (gastroesophageal reflux disease)    H/O multiple concussions    Headache(784.0)    History of palpitations    Hx of fracture of nose    Mental disorder    Multiple fractures    Hx: of a leg and arm fracture as a child   Pneumonia    PONV (postoperative nausea and vomiting)    Post traumatic stress disorder (PTSD)    Pregnant 01/21/2016   PTSD (post-traumatic stress disorder)    Sinus infection 01/31/2016   Supervision of normal pregnancy, antepartum 02/08/2016    [facility] at             13+1 week FOB   [name] Daily 38 yo WM Dating By  LMP and US  Pap  02/08/16 GC/CT Initial:                36+wks: Genetic Screen NT/IT:  CF screen  Anatomic US   Flu vaccine  Tdap Recommended ~ 28wks Glucose Screen  2 hr GBS  Feed Preference  Contraception  Circumcision  Childbirth Classes   Pediatrician     Past Surgical History:  Procedure Laterality Date   botox injections     colposcopy     DILATION AND CURETTAGE OF UTERUS     nexplanon     SUBOCCIPITAL CRANIECTOMY CERVICAL LAMINECTOMY N/A 12/16/2018   Procedure: Cervical one Laminectomy/Chiari decompression;  Surgeon: Onetha Kuba, MD;  Location: [facility] OR;  Service: Neurosurgery;  Laterality: N/A;   WISDOM TOOTH EXTRACTION     Patient Active Problem List   Diagnosis Date Noted   Major depressive disorder, recurrent episode, mild 04/08/2024   GAD (generalized anxiety disorder) 01/16/2024   Vertigo 12/31/2023   Atypical chest pain 09/25/2022   Migraine with aura and without status migrainosus 07/24/2022   History of posttraumatic stress disorder (PTSD) 04/25/2022   Vitamin D  deficiency 09/11/2019   Hyperlipidemia 09/11/2019   Palpitations 07/30/2018   Chiari I malformation (HCC) 02/13/2018   Current smoker 04/20/2017   Asthma 06/04/2013   Depression 06/04/2013    REFERRING PROVIDER: Juliene Dunnings   REFERRING DIAG: Cervicalgia  THERAPY DIAG:  Cervicalgia  Other muscle spasm  Rationale for Evaluation and Treatment: Rehabilitation  ONSET DATE:  Ongoing.    SUBJECTIVE:                                                                                                                                                                                                         SUBJECTIVE STATEMENT: The patient returns to to PT today with c/o neck and mid-back pain rated at a 5-6/10.  Heat and ice can help decrease her pain.  Strenuous activity and looking down increase her pain.    PERTINENT HISTORY:  Please see above.    PAIN:  Are you having pain? Yes: NPRS scale: 5-6/10.   Pain location: Neck and mid-back.   Pain description: Ache and sharp.   Aggravating factors: As above.   Relieving factors: As above.    PRECAUTIONS: Other: PMH for Chiari malformation.    RED FLAGS: None     WEIGHT BEARING RESTRICTIONS:  No  FALLS:  Has patient fallen in last 6 months? No  LIVING ENVIRONMENT: Lives with: lives with their family Lives in: House/apartment Has following equipment at home: None  OCCUPATION: Disabled.    PLOF: Independent  PATIENT GOALS: Decrease pain.    OBJECTIVE:   PATIENT SURVEYS:  NDI:  NECK DISABILITY INDEX  Date: 09/30/24. Score  Pain intensity 3 = The pain is fairly severe at the moment  2. Personal care (washing, dressing, etc.) 1 =  I can look after myself normally but it causes extra pain  3. Lifting 5 = I cannot lift or carry anything   4. Reading 4 =  I can hardly read at all because of severe pain in my neck  5. Headaches 5 = I have headaches almost all the time  6. Concentration 4 =  I have a great deal of difficulty in concentrating when I want to  7. Work 4 = I can hardly do any work at all  8. Driving 5 = I can't drive my car at all  9. Sleeping 4 = My sleep is greatly disturbed (3-5 hrs sleepless)   10. Recreation 3 = I am able to engage in a few of my usual recreation activities because of pain in   my neck  Total 38 /50   Minimum Detectable Change (90% confidence): 5 points or 10% points   POSTURE: rounded shoulders and forward head  PALPATION: TTP over bilateral UT's and scapular retractor musculature.     CERVICAL ROM:   Active bilateral cervical rotation is 80 degrees.  UPPER EXTREMITY ROM:  WNL.  UPPER EXTREMITY MMT:  WNL.    TREATMENT  DATE: 10/31/24:    HMP and IFC at 80-150 Hz on 40% scan x 20 minutes f/b gentle STW/M x 8 minutes to bilateral UT's.  Normal modality resposne following removal of modality.                                                                                                                                 PATIENT EDUCATION:  Education details:  Person educated:  International aid/development worker:  Education comprehension:   HOME EXERCISE PROGRAM:   ASSESSMENT:  CLINICAL IMPRESSION: The patient presents to OPPT  with c/o chronic neck and mid-back pain.  Her NDI score is 38/50.  She is tender to palpation over bilateral UT's and scapular retractor musculature.  Patient will benefit from skilled physical therapy intervention to address pain and deficits.  OBJECTIVE IMPAIRMENTS: decreased activity tolerance, increased muscle spasms, and pain.   ACTIVITY LIMITATIONS: carrying, lifting, and sitting  PARTICIPATION LIMITATIONS: meal prep, cleaning, and laundry  PERSONAL FACTORS: Time since onset of injury/illness/exacerbation and 1 comorbidity: h/o chiari malformation and subsequent surgery are also affecting patient's functional outcome.   REHAB POTENTIAL: Good  CLINICAL DECISION MAKING: Stable/uncomplicated  EVALUATION COMPLEXITY: Low   GOALS: LONG TERM GOALS: Target date: 10/28/24  Ind with a HEP.  Goal status: INITIAL  2.  Perform ADL's with pain not > 3/10.  Goal status: INITIAL  3.  Improve NDI score by at least 5 points.  Goal status: INITIAL   PLAN:  PT FREQUENCY/DURATION:  6 visits.    PLANNED INTERVENTIONS: 97110-Therapeutic exercises, 97530- Therapeutic activity, [id number]- Neuromuscular re-education, 97535- Self Care, 02859- Manual therapy, G0283- Electrical stimulation (unattended), 97035- Ultrasound, Patient/Family education, Cryotherapy, and Moist heat  PLAN FOR NEXT SESSION: Postural exercises, STW/M and modalities as needed.     Kaylon Hitz, [place], PT 09/30/2024, 12:24 PM

## 2024-09-30 NOTE — Therapy (Deleted)
 OUTPATIENT PHYSICAL THERAPY CERVICAL EVALUATION   Patient Name: Mary [name] MRN: [id number] DOB:12-14-1985, 38 y.o., female Today's Date: 09/30/2024  END OF SESSION:  PT End of Session - 09/30/24 1056     Visit Number 1    Number of Visits 6    Date for Recertification  10/28/24    PT Start Time 1017    Activity Tolerance Patient tolerated treatment well    Behavior During Therapy [facility] for tasks assessed/performed          Past Medical History:  Diagnosis Date   Asthma    Atypical squamous cell changes of undetermined significance (ASCUS) on cervical cytology with positive high risk human papilloma virus (HPV) 07/08/2013   Overview:   ASCUS with positive high risk HPV 07/08/13  Mild to moderate dysplasia on colpo 02/10/14   Plan for repeat colpo in 6 months (Due 08/13/14) schedulers flagged.  ASCUS with positive high risk HPV 07/08/13  Mild to moderate dysplasia on colpo 02/10/14   Plan for repeat colpo in 6 months (Due 08/13/14) schedulers flagged.   Bronchitis 01/31/2016   Chiari malformation type I (HCC)    Depression    GERD (gastroesophageal reflux disease)    H/O multiple concussions    Headache(784.0)    History of palpitations    Hx of fracture of nose    Mental disorder    Multiple fractures    Hx: of a leg and arm fracture as a child   Pneumonia    PONV (postoperative nausea and vomiting)    Post traumatic stress disorder (PTSD)    Pregnant 01/21/2016   PTSD (post-traumatic stress disorder)    Sinus infection 01/31/2016   Supervision of normal pregnancy, antepartum 02/08/2016    [facility] at             13+1 week FOB   [name] Daily 38 yo WM Dating By  LMP and US  Pap  02/08/16 GC/CT Initial:                36+wks: Genetic Screen NT/IT:  CF screen  Anatomic US   Flu vaccine  Tdap Recommended ~ 28wks Glucose Screen  2 hr GBS  Feed Preference  Contraception  Circumcision  Childbirth Classes  Pediatrician     Past Surgical History:  Procedure  Laterality Date   botox injections     colposcopy     DILATION AND CURETTAGE OF UTERUS     nexplanon     SUBOCCIPITAL CRANIECTOMY CERVICAL LAMINECTOMY N/A 12/16/2018   Procedure: Cervical one Laminectomy/Chiari decompression;  Surgeon: Onetha Kuba, MD;  Location: [facility] OR;  Service: Neurosurgery;  Laterality: N/A;   WISDOM TOOTH EXTRACTION     Patient Active Problem List   Diagnosis Date Noted   Major depressive disorder, recurrent episode, mild 04/08/2024   GAD (generalized anxiety disorder) 01/16/2024   Vertigo 12/31/2023   Atypical chest pain 09/25/2022   Migraine with aura and without status migrainosus 07/24/2022   History of posttraumatic stress disorder (PTSD) 04/25/2022   Vitamin D  deficiency 09/11/2019   Hyperlipidemia 09/11/2019   Palpitations 07/30/2018   Chiari I malformation (HCC) 02/13/2018   Current smoker 04/20/2017   Asthma 06/04/2013   Depression 06/04/2013    PCP: ***  REFERRING PROVIDER: ***  REFERRING DIAG: ***  THERAPY DIAG:  Cervicalgia  Other muscle spasm  Rationale for Evaluation and Treatment: {HABREHAB:27488}  ONSET DATE: ***  SUBJECTIVE:  SUBJECTIVE STATEMENT: *** Hand dominance: {MISC; OT HAND DOMINANCE:[phone]}  PERTINENT HISTORY:  ***  PAIN:  Are you having pain? {OPRCPAIN:27236}  PRECAUTIONS: {Therapy precautions:24002}  RED FLAGS: {PT Red Flags:29287}     WEIGHT BEARING RESTRICTIONS: {Yes ***/No:24003}  FALLS:  Has patient fallen in last 6 months? {fallsyesno:27318}  LIVING ENVIRONMENT: Lives with: {OPRC lives with:25569::lives with their family} Lives in: {Lives in:25570} Stairs: {opstairs:27293} Has following equipment at home: {Assistive devices:23999}  OCCUPATION: ***  PLOF: {PLOF:24004}  PATIENT GOALS:  ***  NEXT MD VISIT: ***  OBJECTIVE:  Note: Objective measures were completed at Evaluation unless otherwise noted.  DIAGNOSTIC FINDINGS:  ***  PATIENT SURVEYS:  {rehab surveys:24030}  COGNITION: Overall cognitive status: {cognition:24006}  SENSATION: {sensation:27233}  POSTURE: {posture:25561}  PALPATION: ***   CERVICAL ROM:   {AROM/PROM:27142} ROM A/PROM (deg) eval  Flexion   Extension   Right lateral flexion   Left lateral flexion   Right rotation   Left rotation    (Blank rows = not tested)  UPPER EXTREMITY ROM:  {AROM/PROM:27142} ROM Right eval Left eval  Shoulder flexion    Shoulder extension    Shoulder abduction    Shoulder adduction    Shoulder extension    Shoulder internal rotation    Shoulder external rotation    Elbow flexion    Elbow extension    Wrist flexion    Wrist extension    Wrist ulnar deviation    Wrist radial deviation    Wrist pronation    Wrist supination     (Blank rows = not tested)  UPPER EXTREMITY MMT:  MMT Right eval Left eval  Shoulder flexion    Shoulder extension    Shoulder abduction    Shoulder adduction    Shoulder extension    Shoulder internal rotation    Shoulder external rotation    Middle trapezius    Lower trapezius    Elbow flexion    Elbow extension    Wrist flexion    Wrist extension    Wrist ulnar deviation    Wrist radial deviation    Wrist pronation    Wrist supination    Grip strength     (Blank rows = not tested)  CERVICAL SPECIAL TESTS:  {Cervical special tests:25246}  FUNCTIONAL TESTS:  {Functional tests:24029}  TREATMENT DATE: ***                                                                                                                                 PATIENT EDUCATION:  Education details: *** Person educated: {Person educated:25204} Education method: {Education Method:25205} Education comprehension: {Education Comprehension:25206}  HOME EXERCISE  PROGRAM: ***  ASSESSMENT:  CLINICAL IMPRESSION: Patient is a *** y.o. *** who was seen today for physical therapy evaluation and treatment for ***.   OBJECTIVE IMPAIRMENTS: {opptimpairments:25111}.   ACTIVITY LIMITATIONS: {activitylimitations:27494}  PARTICIPATION LIMITATIONS: {participationrestrictions:25113}  PERSONAL FACTORS: {Personal factors:25162} are also affecting patient's functional outcome.  REHAB POTENTIAL: {rehabpotential:25112}  CLINICAL DECISION MAKING: {clinical decision making:25114}  EVALUATION COMPLEXITY: {Evaluation complexity:25115}   GOALS: Goals reviewed with patient? {yes/no:20286}  SHORT TERM GOALS: Target date: ***  *** Baseline:  Goal status: INITIAL  2.  *** Baseline:  Goal status: INITIAL  3.  *** Baseline:  Goal status: INITIAL  4.  *** Baseline:  Goal status: INITIAL  5.  *** Baseline:  Goal status: INITIAL  6.  *** Baseline:  Goal status: INITIAL  LONG TERM GOALS: Target date: ***  *** Baseline:  Goal status: INITIAL  2.  *** Baseline:  Goal status: INITIAL  3.  *** Baseline:  Goal status: INITIAL  4.  *** Baseline:  Goal status: INITIAL  5.  *** Baseline:  Goal status: INITIAL  6.  *** Baseline:  Goal status: INITIAL   PLAN:  PT FREQUENCY: {rehab frequency:25116}  PT DURATION: {rehab duration:25117}  PLANNED INTERVENTIONS: {rehab planned interventions:25118::97110-Therapeutic exercises,97530- Therapeutic [phone]- Neuromuscular re-education,97535- Self Rjmz,02859- Manual therapy,Patient/Family education}  PLAN FOR NEXT SESSION: ***   Colena Ketterman, [place], PT 09/30/2024, 10:58 AM

## 2024-10-01 ENCOUNTER — Encounter: Admitting: Psychology

## 2024-10-01 NOTE — Progress Notes (Signed)
 This encounter was created in error - please disregard.

## 2024-10-01 NOTE — Progress Notes (Deleted)
                Mary [name]

## 2024-10-02 DIAGNOSIS — Z5181 Encounter for therapeutic drug level monitoring: Secondary | ICD-10-CM | POA: Diagnosis not present

## 2024-10-03 ENCOUNTER — Encounter: Payer: Self-pay | Admitting: Internal Medicine

## 2024-10-06 ENCOUNTER — Ambulatory Visit: Admitting: *Deleted

## 2024-10-06 ENCOUNTER — Encounter: Payer: Self-pay | Admitting: *Deleted

## 2024-10-06 DIAGNOSIS — M62838 Other muscle spasm: Secondary | ICD-10-CM

## 2024-10-06 DIAGNOSIS — M542 Cervicalgia: Secondary | ICD-10-CM

## 2024-10-06 DIAGNOSIS — R42 Dizziness and giddiness: Secondary | ICD-10-CM

## 2024-10-06 MED ORDER — FLUTICASONE PROPIONATE 50 MCG/ACT NA SUSP: 1.0000 | Freq: Every day | NASAL | 2 refills | Status: AC | PRN

## 2024-10-06 NOTE — Therapy (Signed)
 OUTPATIENT PHYSICAL THERAPY CERVICAL EVALUATION   Patient Name: Mary [name] MRN: [id number] DOB:24-Dec-1985, 38 y.o., female Today's Date: 10/06/2024  END OF SESSION:  PT End of Session - 10/06/24 1159     Visit Number 2    Number of Visits 6    Date for Recertification  10/28/24    PT Start Time 1100          Past Medical History:  Diagnosis Date   Asthma    Atypical squamous cell changes of undetermined significance (ASCUS) on cervical cytology with positive high risk human papilloma virus (HPV) 07/08/2013   Overview:   ASCUS with positive high risk HPV 07/08/13  Mild to moderate dysplasia on colpo 02/10/14   Plan for repeat colpo in 6 months (Due 08/13/14) schedulers flagged.  ASCUS with positive high risk HPV 07/08/13  Mild to moderate dysplasia on colpo 02/10/14   Plan for repeat colpo in 6 months (Due 08/13/14) schedulers flagged.   Bronchitis 01/31/2016   Chiari malformation type I (HCC)    Depression    GERD (gastroesophageal reflux disease)    H/O multiple concussions    Headache(784.0)    History of palpitations    Hx of fracture of nose    Mental disorder    Multiple fractures    Hx: of a leg and arm fracture as a child   Pneumonia    PONV (postoperative nausea and vomiting)    Post traumatic stress disorder (PTSD)    Pregnant 01/21/2016   PTSD (post-traumatic stress disorder)    Sinus infection 01/31/2016   Supervision of normal pregnancy, antepartum 02/08/2016    [facility] at             13+1 week FOB   [name] Daily 37 yo WM Dating By  LMP and US  Pap  02/08/16 GC/CT Initial:                36+wks: Genetic Screen NT/IT:  CF screen  Anatomic US   Flu vaccine  Tdap Recommended ~ 28wks Glucose Screen  2 hr GBS  Feed Preference  Contraception  Circumcision  Childbirth Classes  Pediatrician     Past Surgical History:  Procedure Laterality Date   botox injections     colposcopy     DILATION AND CURETTAGE OF UTERUS     nexplanon     SUBOCCIPITAL  CRANIECTOMY CERVICAL LAMINECTOMY N/A 12/16/2018   Procedure: Cervical one Laminectomy/Chiari decompression;  Surgeon: Onetha Kuba, MD;  Location: [facility] OR;  Service: Neurosurgery;  Laterality: N/A;   WISDOM TOOTH EXTRACTION     Patient Active Problem List   Diagnosis Date Noted   Major depressive disorder, recurrent episode, mild 04/08/2024   GAD (generalized anxiety disorder) 01/16/2024   Vertigo 12/31/2023   Atypical chest pain 09/25/2022   Migraine with aura and without status migrainosus 07/24/2022   History of posttraumatic stress disorder (PTSD) 04/25/2022   Vitamin D  deficiency 09/11/2019   Hyperlipidemia 09/11/2019   Palpitations 07/30/2018   Chiari I malformation (HCC) 02/13/2018   Current smoker 04/20/2017   Asthma 06/04/2013   Depression 06/04/2013    REFERRING PROVIDER: Juliene Dunnings   REFERRING DIAG: Cervicalgia  THERAPY DIAG:  Cervicalgia  Other muscle spasm  Dizziness and giddiness  Rationale for Evaluation and Treatment: Rehabilitation  ONSET DATE: Ongoing.    SUBJECTIVE:  SUBJECTIVE STATEMENT: The patient returns to to PT today with c/o neck and mid-back pain rated at a 6/10 and with a headache. No exs today please.    PERTINENT HISTORY:  Please see above.    PAIN:  Are you having pain? Yes: NPRS scale: 6/10.   Pain location: Neck and mid-back.   Pain description: Ache and sharp.   Aggravating factors: As above.   Relieving factors: As above.    PRECAUTIONS: Other: PMH for Chiari malformation.    RED FLAGS: None     WEIGHT BEARING RESTRICTIONS: No  FALLS:  Has patient fallen in last 6 months? No  LIVING ENVIRONMENT: Lives with: lives with their family Lives in: House/apartment Has following equipment at home: None  OCCUPATION: Disabled.    PLOF:  Independent  PATIENT GOALS: Decrease pain.    OBJECTIVE:   PATIENT SURVEYS:  NDI:  NECK DISABILITY INDEX  Date: 09/30/24. Score  Pain intensity 3 = The pain is fairly severe at the moment  2. Personal care (washing, dressing, etc.) 1 =  I can look after myself normally but it causes extra pain  3. Lifting 5 = I cannot lift or carry anything   4. Reading 4 =  I can hardly read at all because of severe pain in my neck  5. Headaches 5 = I have headaches almost all the time  6. Concentration 4 =  I have a great deal of difficulty in concentrating when I want to  7. Work 4 = I can hardly do any work at all  8. Driving 5 = I can't drive my car at all  9. Sleeping 4 = My sleep is greatly disturbed (3-5 hrs sleepless)   10. Recreation 3 = I am able to engage in a few of my usual recreation activities because of pain in   my neck  Total 38 /50   Minimum Detectable Change (90% confidence): 5 points or 10% points   POSTURE: rounded shoulders and forward head  PALPATION: TTP over bilateral UT's and scapular retractor musculature.     CERVICAL ROM:   Active bilateral cervical rotation is 80 degrees.  UPPER EXTREMITY ROM:  WNL.  UPPER EXTREMITY MMT:  WNL.    TREATMENT DATE: 10/06/24:    Us   combo x 12  mins 1.5 w/m2 to Bil Utraps and cerv. paras HMP and IFC at 80-150 Hz on 40% scan x 15 minutes to cerv. Paras/ Utraps Gentle STW/M x 12 minutes to bilateral UT's and cerv paras.   Normal modality resposne following removal of modality.                                                                                                                                 PATIENT EDUCATION:  Education details:  Person educated:  International aid/development worker:  Education comprehension:   HOME EXERCISE PROGRAM:   ASSESSMENT:  CLINICAL IMPRESSION: The patient presents to OPPT with  c/o chronic neck and mid-back pain.  Pt requested not to perform exs today due to HA and neck pain.Rx focused on  decreasing pain with US  combo, STW and IFC.  Patient reports decreased pain end of session. OBJECTIVE IMPAIRMENTS: decreased activity tolerance, increased muscle spasms, and pain.   ACTIVITY LIMITATIONS: carrying, lifting, and sitting  PARTICIPATION LIMITATIONS: meal prep, cleaning, and laundry  PERSONAL FACTORS: Time since onset of injury/illness/exacerbation and 1 comorbidity: h/o chiari malformation and subsequent surgery are also affecting patient's functional outcome.   REHAB POTENTIAL: Good  CLINICAL DECISION MAKING: Stable/uncomplicated  EVALUATION COMPLEXITY: Low   GOALS: LONG TERM GOALS: Target date: 10/28/24  Ind with a HEP.  Goal status: INITIAL  2.  Perform ADL's with pain not > 3/10.  Goal status: INITIAL  3.  Improve NDI score by at least 5 points.  Goal status: INITIAL   PLAN:  PT FREQUENCY/DURATION:  6 visits.    PLANNED INTERVENTIONS: 97110-Therapeutic exercises, 97530- Therapeutic activity, [id number]- Neuromuscular re-education, 97535- Self Care, 02859- Manual therapy, G0283- Electrical stimulation (unattended), 97035- Ultrasound, Patient/Family education, Cryotherapy, and Moist heat  PLAN FOR NEXT SESSION: Postural exercises, STW/M and modalities as needed.     Mallie Giambra,[place], PTA 10/06/2024, 12:00 PM

## 2024-10-07 ENCOUNTER — Ambulatory Visit (INDEPENDENT_AMBULATORY_CARE_PROVIDER_SITE_OTHER): Admitting: Psychology

## 2024-10-07 DIAGNOSIS — F411 Generalized anxiety disorder: Secondary | ICD-10-CM | POA: Diagnosis not present

## 2024-10-07 DIAGNOSIS — F330 Major depressive disorder, recurrent, mild: Secondary | ICD-10-CM | POA: Diagnosis not present

## 2024-10-07 NOTE — Progress Notes (Unsigned)
 [facility] Counselor/Therapist Progress Note  Patient ID: Mary [name], MRN: [id number]    Date: 10/07/24  Time Spent: 1:00 pm - 1:53 pm : 53 minutes    Treatment Type: Individual Therapy.  Reported Symptoms: I need to make time to think because I am so overwhelmed.   Mental Status Exam: Appearance:  Well Groomed     Behavior: Agitated  Motor: Restlestness  Speech/Language:  Pressured  Affect: Depressed  Mood: anxious  Thought process: circumstantial  Thought content:   WNL  Sensory/Perceptual disturbances:   WNL  Orientation: oriented to person, place, and time/date  Attention: Good  Concentration: Good  Memory: WNL  Fund of knowledge:  Good  Insight:   Fair  Judgment:  Fair  Impulse Control: Good   Risk Assessment: Danger to Self:  No Self-injurious Behavior: No Danger to Others: No Duty to Warn:no Physical Aggression / Violence:No  Access to Firearms a concern: No  Gang Involvement:No   Subjective:   Mary [name] participated from home, via video, and consented to treatment. I discussed the limitations of evaluation and management by telemedicine and the availability of in person appointments. The patient expressed understanding and agreed to proceed.  Therapist participated from office.  Wm reviewed the events of the past week.   Patient doesn't feel like she is being taken seriously by the medical community when she talks about all of her current diagnoses, and the diagnoses that she thinks need to be ruled out. Patient is worrying about, and irritated by what might be going on with her physically,. Patient finds that at times she feels hopeless because of the lack of knowledge about her many diagnoses. There is a neurologist in [place] whose last name begins with O, and he specializes in autonomic issues.    Interventions: Cognitive Behavioral Therapy  Diagnosis: Major depression disorder, recurrent episode, mild F33.0, Generalized  Anxiety Disorder 41.1  Psychiatric Treatment: Yes , via psychiatrist  Treatment Plan:  Client Abilities/Strengths Mary [name] is trying to be optimistic but it is difficult because so much is going on.   Support System: Mary [name] her boyfriend who lives with her and her five children.   Client Treatment Preferences OPT  Client Statement of Needs Mary [name] would like to learn techniques to decrease her anxiety   Treatment Level Weekly  Symptoms  Mary [name] experiences symptoms of anxiety and depression. (Status : maintained)  Goals:   Target Date: 3.6.26 Frequency: Weekly  Progress: 35% Modality: individual    Therapist will provide referrals for additional resources as appropriate.  Therapist will provide psycho-education regarding Mary [name]'s diagnosis and corresponding treatment approaches and interventions. Mary [name] will support the patient's ability to achieve the goals identified. will employ CBT, BA, Problem-solving, Solution Focused, Mindfulness,  coping skills, & other evidenced-based practices will be used to promote progress towards healthy functioning to help manage decrease symptoms associated with their diagnosis.   Reduce overall level, frequency, and intensity of the feelings of depression, anxiety and panic evidenced by decreased overall symptoms from 6 to 7 days/week to 0 to 1 days/week per client report for at least 3 consecutive months. Verbally express understanding of the relationship between feelings of depression and anxiety and their impact on thinking patterns and behaviors. Verbalize an understanding of the role that distorted thinking plays in creating fears, excessive worry, and ruminations.  [name] participated in the creation of the treatment plan)    Mary [name]

## 2024-10-08 ENCOUNTER — Ambulatory Visit: Admitting: Cardiology

## 2024-10-13 ENCOUNTER — Ambulatory Visit: Admitting: *Deleted

## 2024-10-14 ENCOUNTER — Ambulatory Visit (INDEPENDENT_AMBULATORY_CARE_PROVIDER_SITE_OTHER): Admitting: Psychology

## 2024-10-14 DIAGNOSIS — F330 Major depressive disorder, recurrent, mild: Secondary | ICD-10-CM

## 2024-10-14 DIAGNOSIS — F411 Generalized anxiety disorder: Secondary | ICD-10-CM | POA: Diagnosis not present

## 2024-10-14 NOTE — Progress Notes (Addendum)
  [facility] Counselor/Therapist Progress Note  Patient ID: Mary [name], MRN: [id number]    Date: 10/14/24  Time Spent: 1:00 pm - 1:55 pm : 55 minutes     Treatment Type: Individual Therapy.  Reported Symptoms/Chief Complaint: I am tired of being taken advantage of both at home and outside of home.  Mental Status Exam: Appearance:  Casual     Behavior: Blaming  Motor: Normal  Speech/Language:  Pressured  Affect: Depressed  Mood: anxious  Thought process: normal  Thought content:   WNL  Sensory/Perceptual disturbances:   WNL  Orientation: oriented to person, place, and time/date  Attention: Good  Concentration: Good  Memory: WNL  Fund of knowledge:  Fair  Insight:   Fair  Judgment:  Good  Impulse Control: Good   Risk Assessment: Danger to Self:  No Self-injurious Behavior: No Danger to Others: No Duty to Warn:no Physical Aggression / Violence:No  Access to Firearms a concern: No  Gang Involvement:No   Subjective:   Mary [name] participated from home, via video, and consented to treatment. I discussed the limitations of evaluation and management by telemedicine and the availability of in person appointments. The patient expressed understanding and agreed to proceed.  Therapist participated from home office.  Geral reviewed the events of the past week.   Patient was frustrated with her life, and she felt down and hopeless about Jacob's lifestyle.   Interventions: Cognitive Behavioral Therapy  Diagnosis: Major depression disorder, recurrent episode, mild F33.0, Generalized Anxiety Disorder 41.1   Psychiatric Treatment: Yes , via psychiatrist   Treatment Plan:  Client Abilities/Strengths Maelyn is trying to be optimistic but it is difficult because so much is going for the patient.  Support System: [name], her current boyfriend who lives with her and her 5 children.  Client Treatment Preferences OPT  Client Statement of Needs Shady  would like to learn techniques    Treatment Level Weekly  Symptoms  Anja would like to learn techniques to decrease her anxiety and depression.  Goals:   Target Date: 3.6.26 Frequency: Weekly  Progress: 35% Modality: individual    Therapist will provide referrals for additional resources as appropriate.  Therapist will provide psycho-education regarding Romell's diagnosis and corresponding treatment approaches and interventions. [name] will support the patient's ability to achieve the goals identified. will employ CBT, BA, Problem-solving, Solution Focused, Mindfulness,  coping skills, & other evidenced-based practices will be used to promote progress towards healthy functioning to help manage decrease symptoms associated with their diagnosis.   Reduce overall level, frequency, and intensity of the feelings of depression, anxiety and panic evidenced by decreased overall symptoms from 6 to 7 days/week to 0 to 1 days/week per client report for at least 3 consecutive months. Verbally express understanding of the relationship between feelings of depression and anxiety and their impact on thinking patterns and behaviors. Verbalize an understanding of the role that distorted thinking plays in creating fears, excessive worry, and ruminations.  [name] participated in the creation of the treatment plan)     [name]

## 2024-10-17 ENCOUNTER — Encounter: Payer: Self-pay | Admitting: Internal Medicine

## 2024-10-20 MED ORDER — EPINEPHRINE 0.3 MG/0.3ML IJ SOAJ: 0.3000 mg | INTRAMUSCULAR | 2 refills | Status: AC | PRN

## 2024-10-21 ENCOUNTER — Ambulatory Visit: Admitting: Psychology

## 2024-10-21 ENCOUNTER — Encounter (HOSPITAL_BASED_OUTPATIENT_CLINIC_OR_DEPARTMENT_OTHER): Admitting: Cardiology

## 2024-10-21 DIAGNOSIS — F411 Generalized anxiety disorder: Secondary | ICD-10-CM

## 2024-10-21 DIAGNOSIS — R0683 Snoring: Secondary | ICD-10-CM | POA: Diagnosis not present

## 2024-10-21 DIAGNOSIS — F330 Major depressive disorder, recurrent, mild: Secondary | ICD-10-CM | POA: Diagnosis not present

## 2024-10-21 NOTE — Progress Notes (Addendum)
 [name] Counselor/Therapist Progress Note  Patient ID: Mary [name], MRN: [id number]    Date: 10/21/24  Time Spent: 1:02 pm - 1:55 pm : 53 minutes  Treatment Type: Individual Therapy.  Reported Symptoms: patient reported feeling sad and worried about her health and family dynamics.   Mental Status Exam: Appearance:  Casual     Behavior: Blaming  Motor: Restlestness  Speech/Language:  Pressured  Affect: Depressed  Mood: anxious  Thought process: circumstantial  Thought content:   WNL  Sensory/Perceptual disturbances:   WNL  Orientation: oriented to person, place, and time/date  Attention: Good  Concentration: Good  Memory: WNL  Fund of knowledge:  Good  Insight:   Fair  Judgment:  Fair  Impulse Control: Good   Risk Assessment: Danger to Self:  No Self-injurious Behavior: No Danger to Others: No Duty to Warn:no Physical Aggression / Violence:No  Access to Firearms a concern: No  Gang Involvement:No   Subjective:   Mary [name] participated from home, via video, and consented to treatment. I discussed the limitations of evaluation and management by telemedicine and the availability of in person appointments. The patient expressed understanding and agreed to proceed.  Therapist participated from home office.  Mary [name] reviewed the events of the past week.   Dysautonomia continues to weigh on patient's mind. Jacob's surgery was last Friday, so he wasn't able to start work this week. Jacob's niece has been problematic, and patient has struggled with all of her boyfriend's family members. Pt's migraines have been worse in November. Patient describes her current life as nervousness upon nervousness.  Interventions: Cognitive Behavioral Therapy  Diagnosis: Major depression disorder, recurrent episode, mild F33.0, Generalized Anxiety Disorder 41.1   Psychiatric Treatment: Yes , via psychiatrist  Treatment Plan:  Client Abilities/Strengths Mary [name] is  trying to be optimistic but it is difficult because so much is continuing to go on for the patient.   Support System: Mary [name], her current boyfriend who lives with patient and her 5 children.   Client Treatment Preferences OPT  Client Statement of Needs Mary [name] would like to learn stress management techniques.    Treatment Level Weekly  Symptoms  Mary [name] experiences symptoms of of anxiety and depression.   Goals:   Target Date: 3.6.26 Frequency: Weekly  Progress: 35% Modality: individual    Therapist will provide referrals for additional resources as appropriate.  Therapist will provide psycho-education regarding Mary [name]'s diagnosis and corresponding treatment approaches and interventions. Mary [name] will support the patient's ability to achieve the goals identified. will employ CBT, BA, Problem-solving, Solution Focused, Mindfulness,  coping skills, & other evidenced-based practices will be used to promote progress towards healthy functioning to help manage decrease symptoms associated with their diagnosis.   Reduce overall level, frequency, and intensity of the feelings of depression, anxiety and panic evidenced by decreased overall symptoms from 6 to 7 days/week to 0 to 1 days/week per client report for at least 3 consecutive months. Verbally express understanding of the relationship between feelings of depression and anxiety and their impact on thinking patterns and behaviors. Verbalize an understanding of the role that distorted thinking plays in creating fears, excessive worry, and ruminations.  Mary [name] participated in the creation of the treatment plan)    Mary [name]

## 2024-10-22 ENCOUNTER — Ambulatory Visit: Attending: Neurology

## 2024-10-22 ENCOUNTER — Ambulatory Visit: Attending: Cardiology

## 2024-10-22 DIAGNOSIS — M62838 Other muscle spasm: Secondary | ICD-10-CM | POA: Diagnosis present

## 2024-10-22 DIAGNOSIS — M542 Cervicalgia: Secondary | ICD-10-CM | POA: Insufficient documentation

## 2024-10-22 DIAGNOSIS — R42 Dizziness and giddiness: Secondary | ICD-10-CM

## 2024-10-22 DIAGNOSIS — R002 Palpitations: Secondary | ICD-10-CM

## 2024-10-22 NOTE — Therapy (Signed)
 OUTPATIENT PHYSICAL THERAPY CERVICAL TREATMENT   Patient Name: Mary [name] MRN: [id number] DOB:[birth year], 38 y.o., female Today's Date: 10/22/2024  END OF SESSION:  PT End of Session - 10/22/24 1103     Visit Number 3    Number of Visits 6    Date for Recertification  10/28/24    PT Start Time 1021          Past Medical History:  Diagnosis Date   Asthma    Atypical squamous cell changes of undetermined significance (ASCUS) on cervical cytology with positive high risk human papilloma virus (HPV) 07/08/2013   Overview:   ASCUS with positive high risk HPV 07/08/13  Mild to moderate dysplasia on colpo 02/10/14   Plan for repeat colpo in 6 months (Due 08/13/14) schedulers flagged.  ASCUS with positive high risk HPV 07/08/13  Mild to moderate dysplasia on colpo 02/10/14   Plan for repeat colpo in 6 months (Due 08/13/14) schedulers flagged.   Bronchitis 01/31/2016   Chiari malformation type I (HCC)    Depression    GERD (gastroesophageal reflux disease)    H/O multiple concussions    Headache(784.0)    History of palpitations    Hx of fracture of nose    Mental disorder    Multiple fractures    Hx: of a leg and arm fracture as a child   Pneumonia    PONV (postoperative nausea and vomiting)    Post traumatic stress disorder (PTSD)    Pregnant 01/21/2016   PTSD (post-traumatic stress disorder)    Sinus infection 01/31/2016   Supervision of normal pregnancy, antepartum 02/08/2016    [facility] at             13+1 week FOB   [name] Daily 38 yo WM Dating By  LMP and US  Pap  02/08/16 GC/CT Initial:                36+wks: Genetic Screen NT/IT:  CF screen  Anatomic US   Flu vaccine  Tdap Recommended ~ 28wks Glucose Screen  2 hr GBS  Feed Preference  Contraception  Circumcision  Childbirth Classes  Pediatrician     Past Surgical History:  Procedure Laterality Date   botox injections     colposcopy     DILATION AND CURETTAGE OF UTERUS     nexplanon     SUBOCCIPITAL  CRANIECTOMY CERVICAL LAMINECTOMY N/A 12/16/2018   Procedure: Cervical one Laminectomy/Chiari decompression;  Surgeon: Onetha Kuba, MD;  Location: [facility] OR;  Service: Neurosurgery;  Laterality: N/A;   WISDOM TOOTH EXTRACTION     Patient Active Problem List   Diagnosis Date Noted   Major depressive disorder, recurrent episode, mild 04/08/2024   GAD (generalized anxiety disorder) 01/16/2024   Vertigo 12/31/2023   Atypical chest pain 09/25/2022   Migraine with aura and without status migrainosus 07/24/2022   History of posttraumatic stress disorder (PTSD) 04/25/2022   Vitamin D  deficiency 09/11/2019   Hyperlipidemia 09/11/2019   Palpitations 07/30/2018   Chiari I malformation (HCC) 02/13/2018   Current smoker 04/20/2017   Asthma 06/04/2013   Depression 06/04/2013    REFERRING PROVIDER: Juliene Dunnings   REFERRING DIAG: Cervicalgia  THERAPY DIAG:  Cervicalgia  Other muscle spasm  Rationale for Evaluation and Treatment: Rehabilitation  ONSET DATE: Ongoing.    SUBJECTIVE:  SUBJECTIVE STATEMENT: The patient reports 91/0 left upper trap pain today.   PERTINENT HISTORY:  Please see above.    PAIN:  Are you having pain? Yes: NPRS scale: 9/10.   Pain location: Left neck and upper trap Pain description: Ache and sharp.   Aggravating factors: As above.   Relieving factors: As above.    PRECAUTIONS: Other: PMH for Chiari malformation.    RED FLAGS: None     WEIGHT BEARING RESTRICTIONS: No  FALLS:  Has patient fallen in last 6 months? No  LIVING ENVIRONMENT: Lives with: lives with their family Lives in: House/apartment Has following equipment at home: None  OCCUPATION: Disabled.    PLOF: Independent  PATIENT GOALS: Decrease pain.    OBJECTIVE:   PATIENT SURVEYS:  NDI:  NECK  DISABILITY INDEX  Date: 09/30/24. Score  Pain intensity 3 = The pain is fairly severe at the moment  2. Personal care (washing, dressing, etc.) 1 =  I can look after myself normally but it causes extra pain  3. Lifting 5 = I cannot lift or carry anything   4. Reading 4 =  I can hardly read at all because of severe pain in my neck  5. Headaches 5 = I have headaches almost all the time  6. Concentration 4 =  I have a great deal of difficulty in concentrating when I want to  7. Work 4 = I can hardly do any work at all  8. Driving 5 = I can't drive my car at all  9. Sleeping 4 = My sleep is greatly disturbed (3-5 hrs sleepless)   10. Recreation 3 = I am able to engage in a few of my usual recreation activities because of pain in   my neck  Total 38 /50   Minimum Detectable Change (90% confidence): 5 points or 10% points   POSTURE: rounded shoulders and forward head  PALPATION: TTP over bilateral UT's and scapular retractor musculature.     CERVICAL ROM:   Active bilateral cervical rotation is 80 degrees.  UPPER EXTREMITY ROM:  WNL.  UPPER EXTREMITY MMT:  WNL.    TREATMENT DATE:   10/22/24:  US  combo x 12  mins 1.5 w/m2 to left Utraps and cerv. paras HMP and IFC at 80-150 Hz on 40% scan x 15 minutes to cerv. Paras/ Utraps Gentle STW/M  to left UT's and cerv paras.   Normal modality resposne following removal of modality.                                                                                                                                 PATIENT EDUCATION:  Education details:  Person educated:  International aid/development worker:  Education comprehension:   HOME EXERCISE PROGRAM:   ASSESSMENT:  CLINICAL IMPRESSION: Pt arrives for today's treatment session reporting 9/10 left upper trap and neck pain.  Pt also reports increased stress levels at home.  Normal responses  to all modalities performed today.  STW/M and trigger point release performed to left upper trap to  decrease pain and tone.  Pt reported 4/10 left upper trap pain at completion of today's treatment session.  OBJECTIVE IMPAIRMENTS: decreased activity tolerance, increased muscle spasms, and pain.   ACTIVITY LIMITATIONS: carrying, lifting, and sitting  PARTICIPATION LIMITATIONS: meal prep, cleaning, and laundry  PERSONAL FACTORS: Time since onset of injury/illness/exacerbation and 1 comorbidity: h/o chiari malformation and subsequent surgery are also affecting patient's functional outcome.   REHAB POTENTIAL: Good  CLINICAL DECISION MAKING: Stable/uncomplicated  EVALUATION COMPLEXITY: Low   GOALS: LONG TERM GOALS: Target date: 10/28/24  Ind with a HEP.  Goal status: IN PROGRESS  2.  Perform ADL's with pain not > 3/10.  Goal status: IN PROGRESS  3.  Improve NDI score by at least 5 points.  Goal status: IN PROGRESS   PLAN:  PT FREQUENCY/DURATION:  6 visits.    PLANNED INTERVENTIONS: 97110-Therapeutic exercises, 97530- Therapeutic activity, [id number]- Neuromuscular re-education, 97535- Self Care, 02859- Manual therapy, G0283- Electrical stimulation (unattended), 97035- Ultrasound, Patient/Family education, Cryotherapy, and Moist heat  PLAN FOR NEXT SESSION: Postural exercises, STW/M and modalities as needed.     Delon DELENA Gosling, PTA 10/22/2024, 11:11 AM

## 2024-10-22 NOTE — Procedures (Signed)
   SLEEP STUDY REPORT Patient Information Study Date: 10/21/2024 Patient Name: Mary [name] Patient ID: [id number] Birth Date: May 31, 1986 Age: 38 Gender: Female BMI: 31.2 (W=183 lb, H=5' 4'') Referring Physician: Katlyn West, NP  TEST DESCRIPTION: Home sleep apnea testing was completed using the WatchPat, a Type 1 device, utilizing peripheral arterial tonometry (PAT), chest movement, actigraphy, pulse oximetry, pulse rate, body position and snore. AHI was calculated with apnea and hypopnea using valid sleep time as the denominator. RDI includes apneas, hypopneas, and RERAs. The data acquired and the scoring of sleep and all associated events were performed in accordance with the recommended standards and specifications as outlined in the AASM Manual for the Scoring of Sleep and Associated Events 2.2.0 (2015).  FINDINGS: 1. No evidence of Obstructive Sleep Apnea with AHI 3.1/hr. 2. No Central Sleep Apnea. 3. Oxygen desaturations as low as 87%. 4. Mild to moderate snoring was present. O2 sats were < 88% for 0.2 minutes. 5. Total sleep time was 7 hrs and 48 min. 6. 21.1% of total sleep time was spent in REM sleep. 7. Shortened sleep onset latency at 6 min. 8. Prolonged REM sleep onset latency at 178 min. 9. Total awakenings were 7.  DIAGNOSIS: Normal study with no significant sleep disordered breathing.  RECOMMENDATIONS: 1. Normal study with no significant sleep disordered breathing. 2. Healthy sleep recommendations include: adequate nightly sleep (normal 7-9 hrs/night), avoidance of caffeine  after noon and alcohol near bedtime, and maintaining a sleep environment that is cool, dark and quiet. 3. Weight loss for overweight patients is recommended. 4. Snoring recommendations include: weight loss where appropriate, side sleeping, and avoidance of alcohol before bed. 5. Operation of motor vehicle or dangerous equipment must be avoided when feeling drowsy, excessively sleepy,  or mentally fatigued. 6. An ENT consultation which may be useful for specific causes of and possible treatment of bothersome snoring . 7. Weight loss may be of benefit in reducing the severity of snoring.    Signature: Wilbert Bihari, MD; [facility]; Diplomat, American Board of Sleep Medicine Electronically Signed: 10/22/2024 8:38:47 PM

## 2024-10-24 ENCOUNTER — Ambulatory Visit: Admitting: *Deleted

## 2024-10-24 ENCOUNTER — Encounter: Payer: Self-pay | Admitting: *Deleted

## 2024-10-24 DIAGNOSIS — M542 Cervicalgia: Secondary | ICD-10-CM | POA: Diagnosis not present

## 2024-10-24 DIAGNOSIS — M62838 Other muscle spasm: Secondary | ICD-10-CM

## 2024-10-24 DIAGNOSIS — R42 Dizziness and giddiness: Secondary | ICD-10-CM

## 2024-10-24 NOTE — Therapy (Signed)
 OUTPATIENT PHYSICAL THERAPY CERVICAL TREATMENT   Patient Name: Mary [name] MRN: [id number] DOB:[birth year], 38 y.o., female Today's Date: 10/24/2024  END OF SESSION:  PT End of Session - 10/24/24 1237     Visit Number 4    Number of Visits 6    Date for Recertification  10/28/24    PT Start Time 1158    PT Stop Time 1249    PT Time Calculation (min) 51 min          Past Medical History:  Diagnosis Date   Asthma    Atypical squamous cell changes of undetermined significance (ASCUS) on cervical cytology with positive high risk human papilloma virus (HPV) 07/08/2013   Overview:   ASCUS with positive high risk HPV 07/08/13  Mild to moderate dysplasia on colpo 02/10/14   Plan for repeat colpo in 6 months (Due 08/13/14) schedulers flagged.  ASCUS with positive high risk HPV 07/08/13  Mild to moderate dysplasia on colpo 02/10/14   Plan for repeat colpo in 6 months (Due 08/13/14) schedulers flagged.   Bronchitis 01/31/2016   Chiari malformation type I (HCC)    Depression    GERD (gastroesophageal reflux disease)    H/O multiple concussions    Headache(784.0)    History of palpitations    Hx of fracture of nose    Mental disorder    Multiple fractures    Hx: of a leg and arm fracture as a child   Pneumonia    PONV (postoperative nausea and vomiting)    Post traumatic stress disorder (PTSD)    Pregnant 01/21/2016   PTSD (post-traumatic stress disorder)    Sinus infection 01/31/2016   Supervision of normal pregnancy, antepartum 02/08/2016    [facility]             13+1 week FOB   [name] Daily 38 yo WM Dating By  LMP and US  Pap  02/08/16 GC/CT Initial:                36+wks: Genetic Screen NT/IT:  CF screen  Anatomic US   Flu vaccine  Tdap Recommended ~ 28wks Glucose Screen  2 hr GBS  Feed Preference  Contraception  Circumcision  Childbirth Classes  Pediatrician     Past Surgical History:  Procedure Laterality Date   botox injections     colposcopy      DILATION AND CURETTAGE OF UTERUS     nexplanon     SUBOCCIPITAL CRANIECTOMY CERVICAL LAMINECTOMY N/A 12/16/2018   Procedure: Cervical one Laminectomy/Chiari decompression;  Surgeon: Onetha Kuba, MD;  Location: [facility] OR;  Service: Neurosurgery;  Laterality: N/A;   WISDOM TOOTH EXTRACTION     Patient Active Problem List   Diagnosis Date Noted   Major depressive disorder, recurrent episode, mild 04/08/2024   GAD (generalized anxiety disorder) 01/16/2024   Vertigo 12/31/2023   Atypical chest pain 09/25/2022   Migraine with aura and without status migrainosus 07/24/2022   History of posttraumatic stress disorder (PTSD) 04/25/2022   Vitamin D  deficiency 09/11/2019   Hyperlipidemia 09/11/2019   Palpitations 07/30/2018   Chiari I malformation (HCC) 02/13/2018   Current smoker 04/20/2017   Asthma 06/04/2013   Depression 06/04/2013    REFERRING PROVIDER: Juliene Dunnings   REFERRING DIAG: Cervicalgia  THERAPY DIAG:  Cervicalgia  Other muscle spasm  Dizziness and giddiness  Rationale for Evaluation and Treatment: Rehabilitation  ONSET DATE: Ongoing.    SUBJECTIVE:  SUBJECTIVE STATEMENT: The patient reports  left upper trap pain is less, but RT side is hurting more today.   PERTINENT HISTORY:  Please see above.    PAIN:  Are you having pain? Yes: NPRS scale: 5/10.   Pain location: RT neck and upper trap Pain description: Ache and sharp.   Aggravating factors: As above.   Relieving factors: As above.    PRECAUTIONS: Other: PMH for Chiari malformation.    RED FLAGS: None     WEIGHT BEARING RESTRICTIONS: No  FALLS:  Has patient fallen in last 6 months? No  LIVING ENVIRONMENT: Lives with: lives with their family Lives in: House/apartment Has following equipment at home:  None  OCCUPATION: Disabled.    PLOF: Independent  PATIENT GOALS: Decrease pain.    OBJECTIVE:   PATIENT SURVEYS:  NDI:  NECK DISABILITY INDEX  Date: 09/30/24. Score  Pain intensity 3 = The pain is fairly severe at the moment  2. Personal care (washing, dressing, etc.) 1 =  I can look after myself normally but it causes extra pain  3. Lifting 5 = I cannot lift or carry anything   4. Reading 4 =  I can hardly read at all because of severe pain in my neck  5. Headaches 5 = I have headaches almost all the time  6. Concentration 4 =  I have a great deal of difficulty in concentrating when I want to  7. Work 4 = I can hardly do any work at all  8. Driving 5 = I can't drive my car at all  9. Sleeping 4 = My sleep is greatly disturbed (3-5 hrs sleepless)   10. Recreation 3 = I am able to engage in a few of my usual recreation activities because of pain in   my neck  Total 38 /50   Minimum Detectable Change (90% confidence): 5 points or 10% points   POSTURE: rounded shoulders and forward head  PALPATION: TTP over bilateral UT's and scapular retractor musculature.     CERVICAL ROM:   Active bilateral cervical rotation is 80 degrees.  UPPER EXTREMITY ROM:  WNL.  UPPER EXTREMITY MMT:  WNL.    TREATMENT DATE:   10/24/24:  US  combo x 12  mins 1.5 w/m2 to left Utraps and cerv. paras HMP and IFC at 80-150 Hz on 40% scan x 15 minutes to cerv. Paras/ Utraps Gentle STW/M  to Right UT's and cerv paras. X14 mins   Normal modality resposne following removal of modality.                                                                                                                                 PATIENT EDUCATION:  Education details:  Person educated:  International aid/development worker:  Education comprehension:   HOME EXERCISE PROGRAM:   ASSESSMENT:  CLINICAL IMPRESSION: Pt arrives for today's treatment session reporting 5/10 RT upper trap and neck pain and LT  doing better.  Pt  with notable tension in RT Utrap ans cerv. Paras.Normal responses to all modalities performed today.  STW/M and trigger point release performed to RT upper trap to decrease pain and tone.  Decreased pain at completion of today's treatment session.  OBJECTIVE IMPAIRMENTS: decreased activity tolerance, increased muscle spasms, and pain.   ACTIVITY LIMITATIONS: carrying, lifting, and sitting  PARTICIPATION LIMITATIONS: meal prep, cleaning, and laundry  PERSONAL FACTORS: Time since onset of injury/illness/exacerbation and 1 comorbidity: h/o chiari malformation and subsequent surgery are also affecting patient's functional outcome.   REHAB POTENTIAL: Good  CLINICAL DECISION MAKING: Stable/uncomplicated  EVALUATION COMPLEXITY: Low   GOALS: LONG TERM GOALS: Target date: 10/28/24  Ind with a HEP.  Goal status: IN PROGRESS  2.  Perform ADL's with pain not > 3/10.  Goal status: IN PROGRESS  3.  Improve NDI score by at least 5 points.  Goal status: IN PROGRESS   PLAN:  PT FREQUENCY/DURATION:  6 visits.    PLANNED INTERVENTIONS: 97110-Therapeutic exercises, 97530- Therapeutic activity, [id number]- Neuromuscular re-education, 97535- Self Care, 02859- Manual therapy, G0283- Electrical stimulation (unattended), 97035- Ultrasound, Patient/Family education, Cryotherapy, and Moist heat  PLAN FOR NEXT SESSION: Postural exercises, STW/M and modalities as needed.     Rosemary Mossbarger,CHRIS, PTA 10/24/2024, 12:52 PM

## 2024-10-28 ENCOUNTER — Telehealth: Payer: Self-pay | Admitting: *Deleted

## 2024-10-28 ENCOUNTER — Ambulatory Visit: Admitting: Psychology

## 2024-10-28 NOTE — Telephone Encounter (Signed)
-----   Message from Wilbert Bihari sent at 10/22/2024  8:39 PM EST ----- Please let patient know that sleep study showed no significant sleep apnea.

## 2024-10-28 NOTE — Telephone Encounter (Signed)
 The patient has been notified of the result via his mychart.

## 2024-10-29 ENCOUNTER — Ambulatory Visit: Admitting: Psychology

## 2024-10-29 ENCOUNTER — Ambulatory Visit: Admitting: Physical Therapy

## 2024-10-29 DIAGNOSIS — M62838 Other muscle spasm: Secondary | ICD-10-CM

## 2024-10-29 DIAGNOSIS — F330 Major depressive disorder, recurrent, mild: Secondary | ICD-10-CM

## 2024-10-29 DIAGNOSIS — M542 Cervicalgia: Secondary | ICD-10-CM | POA: Diagnosis not present

## 2024-10-29 DIAGNOSIS — F411 Generalized anxiety disorder: Secondary | ICD-10-CM

## 2024-10-29 NOTE — Progress Notes (Signed)
 [facility] Counselor/Therapist Progress Note  Patient ID: Sascha Palma, MRN: [id number]    Date: 10/29/24  Time Spent: 10:00 am - 10:53 am : 53 minutes    Treatment Type: Individual Therapy.  Reported Symptoms: Patient reported having difficulty sleeping, and stated that her mind was racing  Mental Status Exam: Appearance:  Casual     Behavior: Sharing  Motor: Normal  Speech/Language:  Pressured  Affect: Depressed  Mood: anxious  Thought process: loose associations  Thought content:   WNL  Sensory/Perceptual disturbances:   WNL  Orientation: oriented to person, place, time/date, and situation  Attention: Good  Concentration: Good  Memory: WNL  Fund of knowledge:  Good  Insight:   Good  Judgment:  Fair  Impulse Control: Good   Risk Assessment: Danger to Self:  No Self-injurious Behavior: No Danger to Others: No Duty to Warn:no Physical Aggression / Violence:No  Access to Firearms a concern: No  Gang Involvement:No   Subjective:   [name] participated from home, via video, and consented to treatment. I discussed the limitations of evaluation and management by telemedicine and the availability of in person appointments. The patient expressed understanding and agreed to proceed.  Therapist participated from home office.  Zanyiah reviewed the events of the past week.   Patient felt taken advantage of by Susanne Morones  so patient practiced self-care and went to a resource [employer] chief technology officer in [place].  Patient reported not being able to stop worrying, and not having any energy. Patient's birthday is this Sunday and patient doesn't think anyone in her family will acknowledge her 38th birthday.  Interventions: Cognitive Behavioral Therapy  Diagnosis: Major depression disorder, recurrent episode, mild F33.0, Generalized Anxiety Disorder 41.1   Psychiatric Treatment: Yes , via psychiatrist  Treatment Plan:  Client  Abilities/Strengths Taron is trying to be optimistic, but it is difficult because so much is continuing to happen in the patient's life.   Support System: Jacob, her current boyfriend   Client Treatment Preferences OPT  Client Statement of Needs Saphia would like to continue to learn stress management techniques.    Treatment Level Weekly  Symptoms Galadriel experiences symptoms of anxiety and depression.   Goals:   Target Date: 02/13/25 Frequency: Weekly  Progress: 40% Modality: individual    Therapist will provide referrals for additional resources as appropriate.  Therapist will provide psycho-education regarding Haevyn's diagnosis and corresponding treatment approaches and interventions. [name] will support the patient's ability to achieve the goals identified. will employ CBT, BA, Problem-solving, Solution Focused, Mindfulness,  coping skills, & other evidenced-based practices will be used to promote progress towards healthy functioning to help manage decrease symptoms associated with their diagnosis.   Reduce overall level, frequency, and intensity of the feelings of depression, anxiety and panic evidenced by decreased overall symptoms from 6 to 7 days/week to 0 to 1 days/week per client report for at least 3 consecutive months. Verbally express understanding of the relationship between feelings of depression and anxiety and their impact on thinking patterns and behaviors. Verbalize an understanding of the role that distorted thinking plays in creating fears, excessive worry, and ruminations.  [name] participated in the creation of the treatment plan)    [name]

## 2024-10-29 NOTE — Therapy (Signed)
 OUTPATIENT PHYSICAL THERAPY CERVICAL TREATMENT   Patient Name: Mary [name] MRN: [id number] DOB:[birth year], 38 y.o., female Today's Date: 10/29/2024  END OF SESSION:  PT End of Session - 10/29/24 1620     Visit Number 5    Number of Visits 6    Date for Recertification  10/28/24    PT Start Time 0102    PT Stop Time 0156    PT Time Calculation (min) 54 min    Activity Tolerance Patient tolerated treatment well    Behavior During Therapy [facility] for tasks assessed/performed          Past Medical History:  Diagnosis Date   Asthma    Atypical squamous cell changes of undetermined significance (ASCUS) on cervical cytology with positive high risk human papilloma virus (HPV) 07/08/2013   Overview:   ASCUS with positive high risk HPV 07/08/13  Mild to moderate dysplasia on colpo 02/10/14   Plan for repeat colpo in 6 months (Due 08/13/14) schedulers flagged.  ASCUS with positive high risk HPV 07/08/13  Mild to moderate dysplasia on colpo 02/10/14   Plan for repeat colpo in 6 months (Due 08/13/14) schedulers flagged.   Bronchitis 01/31/2016   Chiari malformation type I (HCC)    Depression    GERD (gastroesophageal reflux disease)    H/O multiple concussions    Headache(784.0)    History of palpitations    Hx of fracture of nose    Mental disorder    Multiple fractures    Hx: of a leg and arm fracture as a child   Pneumonia    PONV (postoperative nausea and vomiting)    Post traumatic stress disorder (PTSD)    Pregnant 01/21/2016   PTSD (post-traumatic stress disorder)    Sinus infection 01/31/2016   Supervision of normal pregnancy, antepartum 02/08/2016    [facility]             13+1 week FOB   [name] Daily 38 yo WM Dating By  LMP and US  Pap  02/08/16 GC/CT Initial:                36+wks: Genetic Screen NT/IT:  CF screen  Anatomic US   Flu vaccine  Tdap Recommended ~ 28wks Glucose Screen  2 hr GBS  Feed Preference  Contraception  Circumcision  Childbirth Classes   Pediatrician     Past Surgical History:  Procedure Laterality Date   botox injections     colposcopy     DILATION AND CURETTAGE OF UTERUS     nexplanon     SUBOCCIPITAL CRANIECTOMY CERVICAL LAMINECTOMY N/A 12/16/2018   Procedure: Cervical one Laminectomy/Chiari decompression;  Surgeon: Onetha Kuba, MD;  Location: [facility] OR;  Service: Neurosurgery;  Laterality: N/A;   WISDOM TOOTH EXTRACTION     Patient Active Problem List   Diagnosis Date Noted   Major depressive disorder, recurrent episode, mild 04/08/2024   GAD (generalized anxiety disorder) 01/16/2024   Vertigo 12/31/2023   Atypical chest pain 09/25/2022   Migraine with aura and without status migrainosus 07/24/2022   History of posttraumatic stress disorder (PTSD) 04/25/2022   Vitamin D  deficiency 09/11/2019   Hyperlipidemia 09/11/2019   Palpitations 07/30/2018   Chiari I malformation (HCC) 02/13/2018   Current smoker 04/20/2017   Asthma 06/04/2013   Depression 06/04/2013    REFERRING PROVIDER: Juliene Dunnings   REFERRING DIAG: Cervicalgia  THERAPY DIAG:  Cervicalgia  Other muscle spasm  Rationale for Evaluation and Treatment: Rehabilitation  ONSET DATE:  Ongoing.    SUBJECTIVE:                                                                                                                                                                                                         SUBJECTIVE STATEMENT: Left side doing good.  Right is a 7.    PERTINENT HISTORY:  Please see above.    PAIN:  Are you having pain? Yes: NPRS scale: 7/10.   Pain location: RT neck and upper trap Pain description: Ache and sharp.   Aggravating factors: As above.   Relieving factors: As above.    PRECAUTIONS: Other: PMH for Chiari malformation.    RED FLAGS: None     WEIGHT BEARING RESTRICTIONS: No  FALLS:  Has patient fallen in last 6 months? No  LIVING ENVIRONMENT: Lives with: lives with their family Lives in: House/apartment Has  following equipment at home: None  OCCUPATION: Disabled.    PLOF: Independent  PATIENT GOALS: Decrease pain.    OBJECTIVE:   PATIENT SURVEYS:  NDI:  NECK DISABILITY INDEX  Date: 09/30/24. Score  Pain intensity 3 = The pain is fairly severe at the moment  2. Personal care (washing, dressing, etc.) 1 =  I can look after myself normally but it causes extra pain  3. Lifting 5 = I cannot lift or carry anything   4. Reading 4 =  I can hardly read at all because of severe pain in my neck  5. Headaches 5 = I have headaches almost all the time  6. Concentration 4 =  I have a great deal of difficulty in concentrating when I want to  7. Work 4 = I can hardly do any work at all  8. Driving 5 = I can't drive my car at all  9. Sleeping 4 = My sleep is greatly disturbed (3-5 hrs sleepless)   10. Recreation 3 = I am able to engage in a few of my usual recreation activities because of pain in   my neck  Total 38 /50   Minimum Detectable Change (90% confidence): 5 points or 10% points   POSTURE: rounded shoulders and forward head  PALPATION: TTP over bilateral UT's and scapular retractor musculature.     CERVICAL ROM:   Active bilateral cervical rotation is 80 degrees.  UPPER EXTREMITY ROM:  WNL.  UPPER EXTREMITY MMT:  WNL.    TREATMENT DATE:   10/29/24:  Combo e'stim/US  at 1.50 W/CM2 x 12 minutes to patient's right UT f/b STW/M x 12 minutes with ischemic release technique  utilized f/b HMP and IFC at 80-150 Hz on 40% scan x 20 minutes.  Normal modality resposne following removal of modality.   10/24/24:  US  combo x 12  mins 1.5 w/m2 to left Utraps and cerv. paras HMP and IFC at 80-150 Hz on 40% scan x 15 minutes to cerv. Paras/ Utraps Gentle STW/M  to Right UT's and cerv paras. X14 mins   Normal modality resposne following removal of modality.                                                                                                                                  PATIENT EDUCATION:  Education details:  Person educated:  International aid/development worker:  Education comprehension:   HOME EXERCISE PROGRAM:   ASSESSMENT:  CLINICAL IMPRESSION: Left UT doing well today.  Treatment focused on right UT today with good response to STW/M.    OBJECTIVE IMPAIRMENTS: decreased activity tolerance, increased muscle spasms, and pain.   ACTIVITY LIMITATIONS: carrying, lifting, and sitting  PARTICIPATION LIMITATIONS: meal prep, cleaning, and laundry  PERSONAL FACTORS: Time since onset of injury/illness/exacerbation and 1 comorbidity: h/o chiari malformation and subsequent surgery are also affecting patient's functional outcome.   REHAB POTENTIAL: Good  CLINICAL DECISION MAKING: Stable/uncomplicated  EVALUATION COMPLEXITY: Low   GOALS: LONG TERM GOALS: Target date: 10/28/24  Ind with a HEP.  Goal status: IN PROGRESS  2.  Perform ADL's with pain not > 3/10.  Goal status: IN PROGRESS  3.  Improve NDI score by at least 5 points.  Goal status: IN PROGRESS   PLAN:  PT FREQUENCY/DURATION:  6 visits.    PLANNED INTERVENTIONS: 97110-Therapeutic exercises, 97530- Therapeutic activity, [id number]- Neuromuscular re-education, 97535- Self Care, 02859- Manual therapy, G0283- Electrical stimulation (unattended), 97035- Ultrasound, Patient/Family education, Cryotherapy, and Moist heat  PLAN FOR NEXT SESSION: Postural exercises, STW/M and modalities as needed.     Arsal Tappan, PT 10/29/2024, 4:24 PM

## 2024-10-31 ENCOUNTER — Ambulatory Visit: Admitting: Physical Therapy

## 2024-11-03 ENCOUNTER — Ambulatory Visit: Admitting: Psychology

## 2024-11-03 DIAGNOSIS — F411 Generalized anxiety disorder: Secondary | ICD-10-CM

## 2024-11-03 DIAGNOSIS — F330 Major depressive disorder, recurrent, mild: Secondary | ICD-10-CM | POA: Diagnosis not present

## 2024-11-03 NOTE — Progress Notes (Signed)
 [facility] Counselor/Therapist Progress Note  Patient ID: Mary [name], MRN: [id number]    Date: 11/03/24  Time Spent: 1:02 pm - 1:55 pm : 53 minutes    Treatment Type: Individual Therapy.  Reported Symptoms: Patient reported that she continued to have difficulty sleeping,   Mental Status Exam: Appearance:  Casual     Behavior: Sharing  Motor: Normal  Speech/Language:  Pressured  Affect: Depressed  Mood: anxious  Thought process: loose associations  Thought content:   WNL  Sensory/Perceptual disturbances:   WNL  Orientation: oriented to person, place, and time/date  Attention: Good  Concentration: Good  Memory: WNL  Fund of knowledge:  Good  Insight:   Good  Judgment:  Good  Impulse Control: Good   Risk Assessment: Danger to Self:  No Self-injurious Behavior: No Danger to Others: No Duty to Warn:no Physical Aggression / Violence:No  Access to Firearms a concern: No  Gang Involvement:No   Subjective:   Mary [name] participated from home, via video, and consented to treatment. I discussed the limitations of evaluation and management by telemedicine and the availability of in person appointments. The patient expressed understanding and agreed to proceed.  Therapist participated from home office.  Kandy reviewed the events of the past week.   Patient had her 39th birthday yesterday, and it turned out better than she had expected. Patient talked about her relationship with her diseased mother and her kids, while she was reminiscing about previous birthdays. Patient felt down and worried about her future lifestyle and the future lifestyles of her family members.   Interventions: Cognitive Behavioral Therapy  Diagnosis: Major depression disorder, recurrent, mild F33.0, Generalized Anxiety Disorder 41.1  Psychiatric Treatment: Yes , via psychiatrist  Treatment Plan:  Client Abilities/Strengths Mary [name] is trying to be optimistic, but it is difficult  because so much is continuing to happen in the patient's life.   Support System: [name], her current boyfriend, and her children  Client Treatment Preferences OPT  Client Statement of Needs Mary [name] would like to continue to learn stress management techniques.    Treatment Level Weekly  Symptoms  Mary [name] experiences symptoms of anxiety and depression.  Goals:   Target Date: 3.6.26 Frequency: Weekly  Progress: 45% Modality: individual    Therapist will provide referrals for additional resources as appropriate.  Therapist will provide psycho-education regarding Mary [name]'s diagnosis and corresponding treatment approaches and interventions. Jenkins Mary [name] Nicolas will support the patient's ability to achieve the goals identified. will employ CBT, BA, Problem-solving, Solution Focused, Mindfulness,  coping skills, & other evidenced-based practices will be used to promote progress towards healthy functioning to help manage decrease symptoms associated with their diagnosis.   Reduce overall level, frequency, and intensity of the feelings of depression, anxiety and panic evidenced by decreased overall symptoms from 6 to 7 days/week to 0 to 1 days/week per client report for at least 3 consecutive months. Verbally express understanding of the relationship between feelings of depression and anxiety and their impact on thinking patterns and behaviors. Verbalize an understanding of the role that distorted thinking plays in creating fears, excessive worry, and ruminations.  [name] participated in the creation of the treatment plan)

## 2024-11-04 ENCOUNTER — Ambulatory Visit: Admitting: Psychology

## 2024-11-04 ENCOUNTER — Ambulatory Visit

## 2024-11-10 ENCOUNTER — Telehealth (HOSPITAL_BASED_OUTPATIENT_CLINIC_OR_DEPARTMENT_OTHER): Payer: Self-pay

## 2024-11-10 ENCOUNTER — Ambulatory Visit: Attending: Neurology

## 2024-11-10 DIAGNOSIS — M62838 Other muscle spasm: Secondary | ICD-10-CM | POA: Diagnosis present

## 2024-11-10 DIAGNOSIS — M542 Cervicalgia: Secondary | ICD-10-CM | POA: Diagnosis present

## 2024-11-10 NOTE — Telephone Encounter (Signed)
   Pre-operative Risk Assessment    Patient Name: Mary [name]  DOB: 11-19-1986 MRN: [id number]   Date of last office visit: 09/16/24 with [place] Date of next office visit: NA  Request for Surgical Clearance    Procedure:  Deep Cleaning- Scalling and root planing in all 4 quadrants of the mouth  Date of Surgery:  Clearance TBD                                 Surgeon:  NA Surgeon's Group or Practice Name:  [facility]  Phone number:  [phone] Fax number:  [phone]   Type of Clearance Requested:   - Medical    Type of Anesthesia:  Local    Additional requests/questions:    Bonney Augustin [name] Delores   11/10/2024, 3:59 PM

## 2024-11-10 NOTE — Therapy (Signed)
 OUTPATIENT PHYSICAL THERAPY CERVICAL TREATMENT   Patient Name: Mary [name] MRN: [id number] DOB:[birth year], 38 y.o., female Today's Date: 11/10/2024  END OF SESSION:  PT End of Session - 11/10/24 1024     Visit Number 6    Number of Visits 6    Date for Recertification  10/28/24    PT Start Time 1023    PT Stop Time 1112    PT Time Calculation (min) 49 min    Activity Tolerance Patient tolerated treatment well    Behavior During Therapy WFL for tasks assessed/performed          Past Medical History:  Diagnosis Date   Asthma    Atypical squamous cell changes of undetermined significance (ASCUS) on cervical cytology with positive high risk human papilloma virus (HPV) 07/08/2013   Overview:   ASCUS with positive high risk HPV 07/08/13  Mild to moderate dysplasia on colpo 02/10/14   Plan for repeat colpo in 6 months (Due 08/13/14) schedulers flagged.  ASCUS with positive high risk HPV 07/08/13  Mild to moderate dysplasia on colpo 02/10/14   Plan for repeat colpo in 6 months (Due 08/13/14) schedulers flagged.   Bronchitis 01/31/2016   Chiari malformation type I (HCC)    Depression    GERD (gastroesophageal reflux disease)    H/O multiple concussions    Headache(784.0)    History of palpitations    Hx of fracture of nose    Mental disorder    Multiple fractures    Hx: of a leg and arm fracture as a child   Pneumonia    PONV (postoperative nausea and vomiting)    Post traumatic stress disorder (PTSD)    Pregnant 01/21/2016   PTSD (post-traumatic stress disorder)    Sinus infection 01/31/2016   Supervision of normal pregnancy, antepartum 02/08/2016    [facility]             13+1 week FOB   [name] Daily 38 yo WM Dating By  LMP and US  Pap  02/08/16 GC/CT Initial:                36+wks: Genetic Screen NT/IT:  CF screen  Anatomic US   Flu vaccine  Tdap Recommended ~ 28wks Glucose Screen  2 hr GBS  Feed Preference  Contraception  Circumcision  Childbirth Classes   Pediatrician     Past Surgical History:  Procedure Laterality Date   botox injections     colposcopy     DILATION AND CURETTAGE OF UTERUS     nexplanon     SUBOCCIPITAL CRANIECTOMY CERVICAL LAMINECTOMY N/A 12/16/2018   Procedure: Cervical one Laminectomy/Chiari decompression;  Surgeon: Onetha Kuba, MD;  Location[facility] OR;  Service: Neurosurgery;  Laterality: N/A;   WISDOM TOOTH EXTRACTION     Patient Active Problem List   Diagnosis Date Noted   Major depressive disorder, recurrent episode, mild 04/08/2024   GAD (generalized anxiety disorder) 01/16/2024   Vertigo 12/31/2023   Atypical chest pain 09/25/2022   Migraine with aura and without status migrainosus 07/24/2022   History of posttraumatic stress disorder (PTSD) 04/25/2022   Vitamin D  deficiency 09/11/2019   Hyperlipidemia 09/11/2019   Palpitations 07/30/2018   Chiari I malformation (HCC) 02/13/2018   Current smoker 04/20/2017   Asthma 06/04/2013   Depression 06/04/2013    REFERRING PROVIDER: Juliene Dunnings   REFERRING DIAG: Cervicalgia  THERAPY DIAG:  Cervicalgia  Other muscle spasm  Rationale for Evaluation and Treatment: Rehabilitation  ONSET DATE:  Ongoing.    SUBJECTIVE:                                                                                                                                                                                                         SUBJECTIVE STATEMENT: Pt reports 7/10 right neck and trap pain.    PERTINENT HISTORY:  Please see above.    PAIN:  Are you having pain? Yes: NPRS scale: 7/10.   Pain location: RT neck and upper trap Pain description: Ache and sharp.   Aggravating factors: As above.   Relieving factors: As above.    PRECAUTIONS: Other: PMH for Chiari malformation.    RED FLAGS: None     WEIGHT BEARING RESTRICTIONS: No  FALLS:  Has patient fallen in last 6 months? No  LIVING ENVIRONMENT: Lives with: lives with their family Lives in: House/apartment Has  following equipment at home: None  OCCUPATION: Disabled.    PLOF: Independent  PATIENT GOALS: Decrease pain.    OBJECTIVE:   PATIENT SURVEYS:  NDI:  NECK DISABILITY INDEX  Date: 09/30/24. Score  Pain intensity 3 = The pain is fairly severe at the moment  2. Personal care (washing, dressing, etc.) 1 =  I can look after myself normally but it causes extra pain  3. Lifting 5 = I cannot lift or carry anything   4. Reading 4 =  I can hardly read at all because of severe pain in my neck  5. Headaches 5 = I have headaches almost all the time  6. Concentration 4 =  I have a great deal of difficulty in concentrating when I want to  7. Work 4 = I can hardly do any work at all  8. Driving 5 = I can't drive my car at all  9. Sleeping 4 = My sleep is greatly disturbed (3-5 hrs sleepless)   10. Recreation 3 = I am able to engage in a few of my usual recreation activities because of pain in   my neck  Total 38 /50   Minimum Detectable Change (90% confidence): 5 points or 10% points   POSTURE: rounded shoulders and forward head  PALPATION: TTP over bilateral UT's and scapular retractor musculature.     CERVICAL ROM:   Active bilateral cervical rotation is 80 degrees.  UPPER EXTREMITY ROM:  WNL.  UPPER EXTREMITY MMT:  WNL.    TREATMENT DATE:   11/10/24:  Combo e'stim/US  at 1.50 W/CM2 x 12 minutes to patient's right UT f/b STW/M x 15 minutes with ischemic release technique utilized  f/b HMP and IFC at 80-150 Hz on 40% scan x 15 minutes.  Normal modality resposne following removal of modality.   10/24/24:  US  combo x 12  mins 1.5 w/m2 to left Utraps and cerv. paras HMP and IFC at 80-150 Hz on 40% scan x 15 minutes to cerv. Paras/ Utraps Gentle STW/M  to Right UT's and cerv paras. X14 mins   Normal modality resposne following removal of modality.                                                                                                                                  PATIENT EDUCATION:  Education details:  Person educated:  International aid/development worker:  Education comprehension:   HOME EXERCISE PROGRAM:   ASSESSMENT:  CLINICAL IMPRESSION: Pt arrives for today's treatment session reporting 7/10 right upper trap pain, but states that left side is feeling good today.  Pt scored 38/50 on NDI today which is the same as on evaluation.  Pt has made little progress towards her goals at this time.  Pt declined performance of exercises today.  Normal responses to all modalities performed today.  STW/M performed to right upper trap to decrease pain and tone.  Pt encouraged to call the facility with any questions or concerns.  Pt reported minimal decrease in pain at completion of today's treatment session.  Pt ready for discharge at this time.   OBJECTIVE IMPAIRMENTS: decreased activity tolerance, increased muscle spasms, and pain.   ACTIVITY LIMITATIONS: carrying, lifting, and sitting  PARTICIPATION LIMITATIONS: meal prep, cleaning, and laundry  PERSONAL FACTORS: Time since onset of injury/illness/exacerbation and 1 comorbidity: h/o chiari malformation and subsequent surgery are also affecting patient's functional outcome.   REHAB POTENTIAL: Good  CLINICAL DECISION MAKING: Stable/uncomplicated  EVALUATION COMPLEXITY: Low   GOALS: LONG TERM GOALS: Target date: 10/28/24  Ind with a HEP.  Goal status: IN PROGRESS  2.  Perform ADL's with pain not > 3/10. 12/1: can get up to 8.10  Goal status: IN PROGRESS  3.  Improve NDI score by at least 5 points.   12/1: 38/50 Goal status: IN PROGRESS   PLAN:  PT FREQUENCY/DURATION:  6 visits.    PLANNED INTERVENTIONS: 97110-Therapeutic exercises, 97530- Therapeutic activity, [id number]- Neuromuscular re-education, 97535- Self Care, 02859- Manual therapy, G0283- Electrical stimulation (unattended), 97035- Ultrasound, Patient/Family education, Cryotherapy, and Moist heat  PLAN FOR NEXT SESSION: Postural exercises,  STW/M and modalities as needed.     Delon DELENA Gosling, PTA 11/10/2024, 11:46 AM

## 2024-11-10 NOTE — Telephone Encounter (Signed)
   Patient Name: Mary [name]  DOB: 21-Nov-1986 MRN: [id number]  Primary Cardiologist: Kardie Tobb, DO  Chart reviewed as part of pre-operative protocol coverage.   Simple dental extractions (i.e. 1-2 teeth), deep cleanings are considered low risk procedures per guidelines and generally do not require any specific cardiac clearance. It is also generally accepted that for simple extractions and dental cleanings, there is no need to interrupt blood thinner therapy.  SBE prophylaxis is not required for the patient from a cardiac standpoint.  I will route this recommendation to the requesting party via Epic fax function and remove from pre-op pool.  Please call with questions.  Damien JAYSON Braver, NP 11/10/2024, 4:09 PM

## 2024-11-11 ENCOUNTER — Encounter: Payer: Self-pay | Admitting: Neurology

## 2024-11-11 ENCOUNTER — Ambulatory Visit: Admitting: Psychology

## 2024-11-11 NOTE — Telephone Encounter (Signed)
 Verbally read clearance to [name] from [place], [place] and Dr. Sheena:  Simple dental extractions (i.e. 1-2 teeth), deep cleanings are considered low risk procedures per guidelines and generally do not require any specific cardiac clearance. It is also generally accepted that for simple extractions and dental cleanings, there is no need to interrupt blood thinner therapy.   SBE prophylaxis is not required for the patient from a cardiac standpoint.  Verbalized understanding and no further questions.

## 2024-11-11 NOTE — Telephone Encounter (Signed)
 Alex from [place] and [employer]  calling back. They did not receive the clearance. Fax Number: [phone]  Please advise.

## 2024-11-17 ENCOUNTER — Encounter: Admitting: Psychology

## 2024-11-17 NOTE — Progress Notes (Signed)
                Mary [name]

## 2024-11-17 NOTE — Progress Notes (Deleted)
 This encounter was created in error - please disregard.

## 2024-11-18 ENCOUNTER — Ambulatory Visit: Admitting: Psychology

## 2024-11-18 DIAGNOSIS — F411 Generalized anxiety disorder: Secondary | ICD-10-CM

## 2024-11-18 DIAGNOSIS — F330 Major depressive disorder, recurrent, mild: Secondary | ICD-10-CM

## 2024-11-18 NOTE — Progress Notes (Signed)
 [facility] Counselor/Therapist Progress Note  Patient ID: Mary [name], MRN: [id number]    Date: 11/18/24  Time Spent: 1:01 pm - 1:54 pm : 54 minutes    Treatment Type: Individual Therapy.  Reported Symptoms: I have had anxiety since I was 38 years old and I've never felt this anxious before.  Mental Status Exam: Appearance:  Well Groomed     Behavior: Sharing  Motor: Restlestness  Speech/Language:  Pressured  Affect: Depressed  Mood: anxious  Thought process: circumstantial  Thought content:   WNL  Sensory/Perceptual disturbances:   WNL  Orientation: oriented to person, place, and time/date  Attention: Good  Concentration: Good  Memory: WNL  Fund of knowledge:  Good  Insight:   Fair  Judgment:  Fair  Impulse Control: Good   Risk Assessment: Danger to Self:  No Self-injurious Behavior: No Danger to Others: No Duty to Warn:no Physical Aggression / Violence:No  Access to Firearms a concern: No  Gang Involvement:No   Subjective:   Mary [name] participated from home, via video, and consented to treatment. I discussed the limitations of evaluation and management by telemedicine and the availability of in person appointments. The patient expressed understanding and agreed to proceed.  Therapist participated from home office.  Mary [name] reviewed the events of the past week.   Patient reported having an anxiety attack yesterday because it was snowing. Patient associated snow with her mother dying because it had been snowing when her mother died. Patient reported worrying non-stop about her mother dying in the snow, because now she associates the snow with her mother dying. Patient also reported feeling hopeless about dealing with her mother's death.  Interventions: Cognitive Behavioral Therapy  Diagnosis:  Major depression disorder, recurrent, mild F33.0, Generalized Anxiety Disorder 41.1   Psychiatric Treatment: Yes , via psychiatrist  Treatment  Plan:  Client Abilities/Strengths Mary [name] is trying to be positive, very verbal, and very engaging.   Support System: Mary [name], her current boyfriend  Client Treatment Preferences OPT  Client Statement of Needs Mary [name] would like to continue to learn stress management techniques.   Treatment Level Weekly  Symptoms  January experiences symptoms of anxiety and depression.   Goals:  Target Date: 3.6.26 Frequency: Weekly  Progress: 50% Modality: individual    Therapist will provide referrals for additional resources as appropriate.  Therapist will provide psycho-education regarding Mary [name]'s diagnosis and corresponding treatment approaches and interventions. Mary [name] will support the patient's ability to achieve the goals identified. will employ CBT, BA, Problem-solving, Solution Focused, Mindfulness,  coping skills, & other evidenced-based practices will be used to promote progress towards healthy functioning to help manage decrease symptoms associated with their diagnosis.   Reduce overall level, frequency, and intensity of the feelings of depression, anxiety and panic evidenced by decreased overall symptoms from 6 to 7 days/week to 0 to 1 days/week per client report for at least 3 consecutive months. Verbally express understanding of the relationship between feelings of depression, anxiety and their impact on thinking patterns and behaviors. Verbalize an understanding of the role that distorted thinking plays in creating fears, excessive worry, and ruminations.  [name] participated in the creation of the treatment plan)    Mary [name]

## 2024-11-24 ENCOUNTER — Ambulatory Visit

## 2024-11-24 ENCOUNTER — Encounter: Payer: Self-pay | Admitting: Internal Medicine

## 2024-11-25 ENCOUNTER — Telehealth: Payer: Self-pay | Admitting: Pharmacy Technician

## 2024-11-25 ENCOUNTER — Ambulatory Visit: Admitting: Psychology

## 2024-11-25 NOTE — Telephone Encounter (Signed)
 Pharmacy Patient Advocate Encounter   Received notification from CoverMyMeds that prior authorization for NURTEC 75MG  is required/requested.   Insurance verification completed.   The patient is insured through [facility].   Per test claim: PA required; PA submitted to above mentioned insurance via Latent Key/confirmation #/EOC [id number] Status is pending

## 2024-11-26 ENCOUNTER — Ambulatory Visit: Admitting: Psychology

## 2024-11-26 DIAGNOSIS — F411 Generalized anxiety disorder: Secondary | ICD-10-CM | POA: Diagnosis not present

## 2024-11-26 DIAGNOSIS — F330 Major depressive disorder, recurrent, mild: Secondary | ICD-10-CM | POA: Diagnosis not present

## 2024-11-26 NOTE — Progress Notes (Signed)
 [facility] Counselor/Therapist Progress Note  Patient ID: Mary [name], MRN: [id number]    Date: 11/26/2024  Time Spent: 4:00 pm - 4:55 pm : 55 minutes    Treatment Type: Individual Therapy.  Reported Symptoms: Patient reported not sleeping very well for several nights.   Mental Status Exam: Appearance:  Casual     Behavior: Sharing  Motor: Restlestness  Speech/Language:  Pressured  Affect: Depressed  Mood: anxious  Thought process: circumstantial  Thought content:   WNL  Sensory/Perceptual disturbances:   WNL  Orientation: oriented to person, place, and time/date  Attention: Good  Concentration: Good  Memory: WNL  Fund of knowledge:  Good  Insight:   Fair  Judgment:  Fair  Impulse Control: Good   Risk Assessment: Danger to Self:  No Self-injurious Behavior: No Danger to Others: No Duty to Warn:no Physical Aggression / Violence:No  Access to Firearms a concern: No  Gang Involvement:No   Subjective:   Mary [name] participated from home, via video, and consented to treatment. I discussed the limitations of evaluation and management by telemedicine and the availability of in person appointments. The patient expressed understanding and agreed to proceed.  Therapist participated from home office.  Mary [name] reviewed the events of the past week.    Patient reported having trouble sleeping and she thinks it's because of the heat in her room, along with my anxiety, dealing with my kids,and the holidays. Patient states I'm really nervous about having to bring in my bag of receipts to give to the Medicaid people so I can get my insurance. I need them to understand that I want to give experiences and make memories for my children. Patient also felt down about her dream about Jacob and another woman that he was friendly with in her dream.   Interventions: Cognitive Behavioral Therapy  Diagnosis: Major depression disorder, recurrent, mild F33.0, Generalized  Anxiety Disorder 41.1  Psychiatric Treatment: Yes , via psychiatrist  Treatment Plan:  Client Abilities/Strengths Zanaya is still trying to be positive, very verbal, and very engaging.  Support System: Jacob, her current boyfriend  Client Treatment Preferences OPT  Client Statement of Needs Ledora would like to continue to learn stress management techniques, and    Treatment Level Weekly  Symptoms  Cathey experiences symptoms of anxiety and depression.   Goals:   Target Date: 3.6.26 Frequency: Weekly  Progress: 55% Modality: individual    Therapist will provide referrals for additional resources as appropriate.  Therapist will provide psycho-education regarding Mary [name]'s diagnosis and corresponding treatment approaches and interventions. [name] will support the patient's ability to achieve the goals identified. will employ CBT, BA, Problem-solving, Solution Focused, Mindfulness,  coping skills, & other evidenced-based practices will be used to promote progress towards healthy functioning to help manage decrease symptoms associated with their diagnosis.   Reduce overall level, frequency, and intensity of the feelings of depression, anxiety and panic evidenced by decreased overall symptoms from 6 to 7 days/week to 0 to 1 days/week per client report for at least 3 consecutive months. Verbally express understanding of the relationship between feelings of depression and anxiety and their impact on thinking patterns and behaviors. Verbalize an understanding of the role that distorted thinking plays in creating fears, excessive worry, and ruminations.  [name] participated in the creation of the treatment plan)    [name]

## 2024-11-27 ENCOUNTER — Other Ambulatory Visit (HOSPITAL_COMMUNITY): Payer: Self-pay | Admitting: Neurosurgery

## 2024-11-27 ENCOUNTER — Other Ambulatory Visit (HOSPITAL_COMMUNITY): Payer: Self-pay

## 2024-11-27 DIAGNOSIS — G43919 Migraine, unspecified, intractable, without status migrainosus: Secondary | ICD-10-CM

## 2024-11-27 DIAGNOSIS — G935 Compression of brain: Secondary | ICD-10-CM

## 2024-11-27 NOTE — Telephone Encounter (Signed)
 Pharmacy Patient Advocate Encounter  Received notification from [employer] that Prior Authorization for NURTEC 75MG  has been APPROVED from 12.16.25 to 12.31.26   PA #/Case ID/Reference #: [id number]

## 2024-11-28 ENCOUNTER — Other Ambulatory Visit: Payer: Self-pay | Admitting: Medical Genetics

## 2024-12-09 ENCOUNTER — Encounter: Payer: Self-pay | Admitting: Cardiology

## 2024-12-09 ENCOUNTER — Ambulatory Visit: Admitting: Psychology

## 2024-12-09 DIAGNOSIS — F330 Major depressive disorder, recurrent, mild: Secondary | ICD-10-CM

## 2024-12-09 DIAGNOSIS — G90A Postural orthostatic tachycardia syndrome (POTS): Secondary | ICD-10-CM

## 2024-12-09 DIAGNOSIS — F411 Generalized anxiety disorder: Secondary | ICD-10-CM

## 2024-12-09 NOTE — Progress Notes (Signed)
  [facility] Counselor/Therapist Progress Note  Patient ID: Akiyah Eppolito, MRN: [id number]    Date: 12/09/2024  Time Spent: 1:01 pm - 1:54 pm : 53 minutes   Treatment Type: Individual Therapy.  Reported Symptoms: Patient is scheduled for 2 CT scans tomorrow to try and find out why she is in so much physical pain.   Mental Status Exam: Appearance:  Casual     Behavior: Sharing  Motor: Restlestness  Speech/Language:  Pressured  Affect: Depressed  Mood: anxious  Thought process: circumstantial  Thought content:   WNL  Sensory/Perceptual disturbances:   WNL  Orientation: oriented to person, place, and time/date  Attention: Good  Concentration: Good  Memory: WNL  Fund of knowledge:  Good  Insight:   Fair  Judgment:  Fair  Impulse Control: Fair   Risk Assessment: Danger to Self:  No Self-injurious Behavior: No Danger to Others: No Duty to Warn:no Physical Aggression / Violence:No  Access to Firearms a concern: No  Gang Involvement:No   Subjective:   [name] participated from home, via video, and consented to treatment. I discussed the limitations of evaluation and management by telemedicine and the availability of in person appointments. The patient expressed understanding and agreed to proceed.  Therapist participated from home office.  Brilynn reviewed the events of the past week.   Pt reported that one year ago tomorrow, patient was visiting her mother (now deceased) in [facility]. Patient was hoping to get through December 18, 2024 (one year anniversary of when pt's mother died). Pt reported feeling nervous, on edge, restless, and down due to being in physical pain and needing to get two CT scans on her neck tomorrow.    Interventions: Cognitive Behavioral Therapy  Diagnosis:  Major depression disorder, recurrent, mild F33.0, Generalized Anxiety Disorder 41.1   Psychiatric Treatment: Yes , via psychiatrist  Treatment Plan:  Client  Abilities/Strengths Josephine is still trying to be positive, very verbal and engaging.   Support System: Her current boyfriend, [name].  Client Treatment Preferences OPT  Client Statement of Needs Skila would like to continue to learn stress management techniques.  Treatment Level Weekly  Symptoms  Loretto experiences symptoms of anxiety and depression.  Goals:   Target Date: 3.6.26 Frequency: Weekly  Progress: 55% Modality: individual    Therapist will provide referrals for additional resources as appropriate.  Therapist will provide psycho-education regarding Xiamara's diagnosis and corresponding treatment approaches and interventions. [name] will support the patient's ability to achieve the goals identified. will employ CBT, BA, Problem-solving, Solution Focused, Mindfulness,  coping skills, & other evidenced-based practices will be used to promote progress towards healthy functioning to help manage decrease symptoms associated with their diagnosis.   Reduce overall level, frequency, and intensity of the feelings of depression, anxiety and panic evidenced by decreased overall symptoms from 6 to 7 days/week to 0 to 1 days/week per client report for at least 3 consecutive months. Verbally express understanding of the relationship between feelings of depression and anxiety and their impact on thinking patterns and behaviors. Verbalize an understanding of the role that distorted thinking plays in creating fears, excessive worry, and ruminations.  [name] participated in the creation of the treatment plan)    [name]

## 2024-12-10 ENCOUNTER — Encounter (HOSPITAL_BASED_OUTPATIENT_CLINIC_OR_DEPARTMENT_OTHER): Payer: Self-pay

## 2024-12-10 ENCOUNTER — Ambulatory Visit (HOSPITAL_BASED_OUTPATIENT_CLINIC_OR_DEPARTMENT_OTHER)
Admission: RE | Admit: 2024-12-10 | Discharge: 2024-12-10 | Disposition: A | Discharge status: Home / Self Care | Source: Ambulatory Visit | Attending: Neurosurgery | Admitting: Neurosurgery

## 2024-12-10 DIAGNOSIS — G935 Compression of brain: Secondary | ICD-10-CM | POA: Diagnosis present

## 2024-12-10 DIAGNOSIS — G43919 Migraine, unspecified, intractable, without status migrainosus: Secondary | ICD-10-CM

## 2024-12-10 MED ORDER — IOHEXOL 300 MG/ML  SOLN
100.0000 mL | Freq: Once | INTRAMUSCULAR | Status: AC | PRN
  Administered 2024-12-10: 75 mL via INTRAVENOUS

## 2024-12-12 ENCOUNTER — Encounter: Payer: Self-pay | Admitting: Cardiology

## 2024-12-16 ENCOUNTER — Ambulatory Visit: Admitting: Psychology

## 2024-12-16 DIAGNOSIS — F330 Major depressive disorder, recurrent, mild: Secondary | ICD-10-CM

## 2024-12-16 DIAGNOSIS — F411 Generalized anxiety disorder: Secondary | ICD-10-CM

## 2024-12-16 NOTE — Progress Notes (Signed)
 [facility] Counselor/Therapist Progress Note  Patient ID: Mary [name], MRN: [id number]    Date: 12/16/2024  Time Spent: 1:00 pm - 1:55 pm : 55 Minutes  Treatment Type: Individual Therapy.  Reported Symptoms: Pt was questioning whether she over shares with strangers, and she was feeling anxious about the possibility of that.   Mental Status Exam: Appearance:  Well Groomed     Behavior: Sharing  Motor: Normal  Speech/Language:  Pressured  Affect: Depressed  Mood: anxious  Thought process: normal  Thought content:   WNL  Sensory/Perceptual disturbances:   WNL  Orientation: oriented to person, place, and time/date  Attention: Good  Concentration: Good  Memory: WNL  Fund of knowledge:  Good  Insight:   Good  Judgment:  Fair  Impulse Control: Good   Risk Assessment: Danger to Self:  No Self-injurious Behavior: No Danger to Others: No Duty to Warn:no Physical Aggression / Violence:No  Access to Firearms a concern: No  Gang Involvement:No   Subjective:   Mary [name] participated from home, via video, and consented to treatment. I discussed the limitations of evaluation and management by telemedicine and the availability of in person appointments. The patient expressed understanding and agreed to proceed.  Therapist participated from home office.  Mary [name] reviewed the events of the past week.   Patient reported feeling on edge and feeling down because it bothers her that she is in so much pain, and is stressed that she can't work right now. 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 Patient feels overwhelmed by all of the questions from Medicaid.   Interventions: Cognitive Behavioral Therapy  Diagnosis: Major depression disorder, recurrent, mild F33.0, Generalized Anxiety Disorder 41.1    Psychiatric Treatment: Yes , via psychiatrist  Treatment Plan:   Client Abilities/Strengths Mary [name] is still  trying to be positive, very verbal, and engaging  Support System: Her current boyfriend, Mary [name]  Client Treatment Preferences OPT  Client Statement of Needs Mary [name] would like to continue to learn stress management techniques   Treatment Level Weekly  Symptoms  Mary [name] experiences symptoms of anxiety and depression  Goals:   Target Date: 3.6.26 Frequency: Weekly  Progress: 60% Modality: individual    Therapist will provide referrals for additional resources as appropriate.  Therapist will provide psycho-education regarding Danny's diagnosis and corresponding treatment approaches and interventions. [name] Mary Nicolas will support the patient's ability to achieve the goals identified. will employ CBT, BA, Problem-solving, Solution Focused, Mindfulness,  coping skills, & other evidenced-based practices will be used to promote progress towards healthy functioning to help manage decrease symptoms associated with their diagnosis.   Reduce overall level, frequency, and intensity of the feelings of depression, anxiety and panic evidenced by decreased overall symptoms from 6 to 7 days/week to 0 to 1 days/week per client report for at least 3 consecutive months. Verbally express understanding of the relationship between feelings of depression and anxiety and their impact on thinking patterns and behaviors. Verbalize an understanding of the role that distorted thinking plays in creating fears, excessive worry, and ruminations.  [name] participated in the creation of the treatment plan)    [name] Mary Nicolas

## 2024-12-22 ENCOUNTER — Telehealth: Payer: Self-pay | Admitting: Cardiology

## 2024-12-22 NOTE — Telephone Encounter (Signed)
 A referral was sent by Dr. Sheena to [facility], but [facility] responded stating the referral was incorrectly sent to [facility]. No fax number or phone number was listed on the fax.

## 2024-12-23 ENCOUNTER — Other Ambulatory Visit (HOSPITAL_COMMUNITY)

## 2024-12-23 ENCOUNTER — Ambulatory Visit: Admitting: Psychology

## 2024-12-23 DIAGNOSIS — F411 Generalized anxiety disorder: Secondary | ICD-10-CM

## 2024-12-23 DIAGNOSIS — F330 Major depressive disorder, recurrent, mild: Secondary | ICD-10-CM

## 2024-12-23 NOTE — Progress Notes (Signed)
 [facility] Counselor/Therapist Progress Note  Patient ID: Mary [name], MRN: [id number]    Date: 12/23/2024  Time Spent: 1:05 pm - 1:58 pm (53 minutes) we started a few minutes late because patient was on the phone with a distressed family member.   Treatment Type: Individual Therapy.  Reported Symptoms:  I am worried about a gay relative who is married to a transgender person, and it makes me nervous and I feel down.  Mental Status Exam: Appearance:  Casual     Behavior: Sharing  Motor: Normal  Speech/Language:  Pressured  Affect: Depressed  Mood: anxious  Thought process: circumstantial  Thought content:   WNL  Sensory/Perceptual disturbances:   WNL  Orientation: oriented to person, place, and time/date  Attention: Good  Concentration: Good  Memory: WNL  Fund of knowledge:  Good  Insight:   Good  Judgment:  Good  Impulse Control: Good   Risk Assessment: Danger to Self:  No Self-injurious Behavior: No Danger to Others: No Duty to Warn:no Physical Aggression / Violence:No  Access to Firearms a concern: No  Gang Involvement:No   Subjective:   Mary [name] participated from home, via video, and consented to treatment. I discussed the limitations of evaluation and management by telemedicine and the availability of in person appointments. The patient expressed understanding and agreed to proceed.  Therapist participated from home office.  Salisa reviewed the events of the past week.   Patient stated I am worried about a gay relative who is married to a transgender person, and it makes me nervous and I feel down. Patient reported numerous incidents with family members that have caused her significant stress. Pt's mother died on Dec 28, 2023, a little over one year ago, and patient has had trouble falling asleep, and has had difficulty concentrating, due to her grief.   Interventions: Cognitive Behavioral Therapy  Diagnosis: Major depression disorder,  recurent, mild, F33.0, Generalized Anxiety Disorder 41.1  Psychiatric Treatment: Yes , via psychiatrist  Treatment Plan:  Client Abilities/Strengths Trenese is trying to be positive, very, verbal, and engaging.   Support System: Mary [name], her current boyfriend  Client Treatment Preferences OPT  Client Statement of Needs [place] would like to continue to learn stress management techniques   Treatment Level Weekly  Symptoms  Mary [name] experiences symptoms of anxiety and depression.   Goals:  Target Date: 3.6.26 Frequency: Weekly  Progress: 60% Modality: individual    Therapist will provide referrals for additional resources as appropriate.  Therapist will provide psycho-education regarding Mary [name]'s diagnosis and corresponding treatment approaches and interventions. [name] will support the patient's ability to achieve the goals identified. will employ CBT, BA, Problem-solving, Solution Focused, Mindfulness,  coping skills, & other evidenced-based practices will be used to promote progress towards healthy functioning to help manage decrease symptoms associated with their diagnosis.   Reduce overall level, frequency, and intensity of the feelings of depression, anxiety and panic evidenced by decreased overall symptoms from 6 to 7 days/week to 0 to 1 days/week per client report for at least 3 consecutive months. Verbally express understanding of the relationship between feelings of depression and anxiety and their impact on thinking patterns and behaviors. Verbalize an understanding of the role that distorted thinking plays in creating fears, excessive worry, and ruminations.  Mary [name] participated in the creation of the treatment plan)     [name]

## 2024-12-26 ENCOUNTER — Other Ambulatory Visit (HOSPITAL_COMMUNITY)
Admission: RE | Admit: 2024-12-26 | Discharge: 2024-12-26 | Disposition: A | Discharge status: Home / Self Care | Payer: Self-pay | Source: Ambulatory Visit | Attending: Medical Genetics | Admitting: Medical Genetics

## 2024-12-30 ENCOUNTER — Ambulatory Visit: Admitting: Psychology

## 2024-12-30 DIAGNOSIS — F330 Major depressive disorder, recurrent, mild: Secondary | ICD-10-CM

## 2024-12-30 DIAGNOSIS — F411 Generalized anxiety disorder: Secondary | ICD-10-CM

## 2024-12-30 NOTE — Progress Notes (Signed)
 [facility] Counselor/Therapist Progress Note  Patient ID: Mary [name], MRN: [id number]    Date: 12/30/24  Time Spent: 12:03 pm - 12:57 pm : 54 minutes    Treatment Type: Individual Therapy.  Reported Symptoms: Patient had just come from a medical appt and even though she received good news the CT scan results were good, patient continued to feel restless and had difficulty concentrating.   Mental Status Exam: Appearance:  Casual     Behavior: Sharing  Motor: Normal  Speech/Language:  Pressured  Affect: Depressed  Mood: anxious  Thought process: circumstantial  Thought content:   WNL  Sensory/Perceptual disturbances:   WNL  Orientation: oriented to person, place, and time/date  Attention: Good  Concentration: Good  Memory: WNL  Fund of knowledge:  Good  Insight:   Good  Judgment:  Good  Impulse Control: Good   Risk Assessment: Danger to Self:  No Self-injurious Behavior: No Danger to Others: No Duty to Warn:no Physical Aggression / Violence:No  Access to Firearms a concern: No  Gang Involvement:No   Subjective:   Mary [name] participated from home, via video, and consented to treatment. I discussed the limitations of evaluation and management by telemedicine and the availability of in person appointments. The patient expressed understanding and agreed to proceed.  Therapist participated from home office.  Mary [name] reviewed the events of the past week.   Patient had just come from a medical appt and even though she received good news the CT scan results were good, patient continued to feel restless and had difficulty concentrating. Patient continues to feel nervous and down about her finances because Mary [name] was sent home from work after only 13 minutes of being at his job today.  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   Interventions: Cognitive Behavioral  Therapy  Diagnosis:  Major depression disorder, recurrent, mild, F33.0, Generalized Anxiety Disorder 41.1  Psychiatric Treatment: Yes , via psychiatrist  Treatment Plan:  Client Abilities/Strengths Mary [name] is trying to be positive, very verbal, and engaging.  Support System: Mary [name], her live in boyfriend  Client Treatment Preferences OPT  Client Statement of Needs Mary [name] would like to continue to learn stress management techniques.   Treatment Level Weekly  Symptoms  Mary [name] experiences symptoms of anxiety and depression.  Goals:   Target Date: 3.6.26 Frequency: Weekly  Progress: 65% Modality: individual    Therapist will provide referrals for additional resources as appropriate.  Therapist will provide psycho-education regarding Mary [name]'s diagnosis and corresponding treatment approaches and interventions. Jenkins Mary [name] will support the patient's ability to achieve the goals identified. will employ CBT, BA, Problem-solving, Solution Focused, Mindfulness,  coping skills, & other evidenced-based practices will be used to promote progress towards healthy functioning to help manage decrease symptoms associated with their diagnosis.   Reduce overall level, frequency, and intensity of the feelings of depression, anxiety and panic evidenced by decreased overall symptoms from 6 to 7 days/week to 0 to 1 days/week per client report for at least 3 consecutive months. Verbally express understanding of the relationship between feelings of depression and anxiety and their impact on thinking patterns and behaviors. Verbalize an understanding of the role that distorted thinking plays in creating fears, excessive worry, and ruminations.  [name] participated in the creation of the treatment plan)   Jenkins Mary [name]

## 2025-01-02 ENCOUNTER — Other Ambulatory Visit: Payer: Self-pay | Admitting: Neurology

## 2025-01-04 LAB — GENECONNECT MOLECULAR SCREEN: Genetic Analysis Overall Interpretation: NEGATIVE

## 2025-01-06 ENCOUNTER — Ambulatory Visit: Admitting: Psychology

## 2025-01-06 DIAGNOSIS — F411 Generalized anxiety disorder: Secondary | ICD-10-CM

## 2025-01-06 DIAGNOSIS — F330 Major depressive disorder, recurrent, mild: Secondary | ICD-10-CM

## 2025-01-06 NOTE — Progress Notes (Signed)
 [facility] Counselor/Therapist Progress Note  Patient ID: Mary [name], MRN: [id number]    Date: 01/06/25  Time Spent: 1:01 pm - 1:54 pm : 53 minutes   Treatment Type: Individual Therapy.  Reported Symptoms: I feel like I am drawing a better caliber of people toward me; I'm making better people decisions.  Mental Status Exam: Appearance:  Casual     Behavior: Sharing  Motor: Normal  Speech/Language:  Pressured  Affect: Depressed  Mood: anxious  Thought process: circumstantial  Thought content:   WNL  Sensory/Perceptual disturbances:   WNL  Orientation: oriented to person, place, and time/date  Attention: Good  Concentration: Good  Memory: WNL  Fund of knowledge:  Good  Insight:   Good  Judgment:  Good  Impulse Control: Good   Risk Assessment: Danger to Self:  No Self-injurious Behavior: No Danger to Others: No Duty to Warn:no Physical Aggression / Violence:No  Access to Firearms a concern: No  Gang Involvement:No   Subjective:   Mary [name] participated from home, via video, and consented to treatment. I discussed the limitations of evaluation and management by telemedicine and the availability of in person appointments. The patient expressed understanding and agreed to proceed.  Therapist participated from home office.  Ria reviewed the events of the past week.   Patient reported feeling nervous, anxious, and on edge, because she is having to deal with home repairs, dealing with her landlord, and other stressors in her life. Patient also reported feeling down and having trouble concentrating because she is struggling to pay her rent, her medical issues, heart care, etc. Patient planned to give feedback to Patient Relations about her positive interactions with them.    Interventions: Cognitive Behavioral Therapy  Diagnosis: Major depression disorder, recurrent, mild, F33.0, Generalized Anxiety Disorder 41.1   Psychiatric Treatment: Yes , via  psychiatrist  Treatment Plan:  Client Abilities/Strengths Mary [name] is trying to be positive, very verbal, and engaging.   Support System: Jacob, her live-in boyfriend  Client Treatment Preferences OPT  Client Statement of Needs Mary [name] would like to continue to learn stress reduction techniques. Mary [name] would also like to feel less down and have more energy.   Treatment Level Weekly  Symptoms Mary [name] experiences symptoms of anxiety and depression.  Goals:  Target Date: 3.6.26 Frequency: Weekly  Progress: 65% Modality: individual    Therapist will provide referrals for additional resources as appropriate.  Therapist will provide psycho-education regarding Mary [name]'s diagnosis and corresponding treatment approaches and interventions. Mary [name] will support the patient's ability to achieve the goals identified. will employ CBT, BA, Problem-solving, Solution Focused, Mindfulness,  coping skills, & other evidenced-based practices will be used to promote progress towards healthy functioning to help manage decrease symptoms associated with their diagnosis.   Reduce overall level, frequency, and intensity of the feelings of depression, anxiety and panic evidenced by decreased overall symptoms from 6 to 7 days/week to 0 to 1 days/week per client report for at least 3 consecutive months. Verbally express understanding of the relationship between feelings of depression and anxiety and their impact on thinking patterns and behaviors. Verbalize an understanding of the role that distorted thinking plays in creating fears, excessive worry, and ruminations.  Mary [name] participated in the creation of the treatment plan)    Mary [name]

## 2025-01-13 ENCOUNTER — Ambulatory Visit: Admitting: Psychology

## 2025-01-13 DIAGNOSIS — F411 Generalized anxiety disorder: Secondary | ICD-10-CM

## 2025-01-13 DIAGNOSIS — F330 Major depressive disorder, recurrent, mild: Secondary | ICD-10-CM

## 2025-01-19 ENCOUNTER — Ambulatory Visit: Admitting: Cardiology

## 2025-01-20 ENCOUNTER — Ambulatory Visit: Admitting: Psychology

## 2025-01-27 ENCOUNTER — Ambulatory Visit: Admitting: Psychology

## 2025-02-03 ENCOUNTER — Ambulatory Visit: Admitting: Psychology

## 2025-02-10 ENCOUNTER — Ambulatory Visit: Admitting: Psychology

## 2025-02-17 ENCOUNTER — Ambulatory Visit: Admitting: Psychology

## 2025-02-24 ENCOUNTER — Ambulatory Visit: Admitting: Psychology

## 2025-03-03 ENCOUNTER — Ambulatory Visit: Admitting: Psychology

## 2025-03-10 ENCOUNTER — Ambulatory Visit: Admitting: Psychology
